# Patient Record
Sex: Female | Born: 1945
Health system: Southern US, Community
[De-identification: ages and names within clinical notes are randomized; demographics above are authoritative.]

## PROBLEM LIST (undated history)

## (undated) DIAGNOSIS — I10 Essential (primary) hypertension: Secondary | ICD-10-CM

## (undated) DIAGNOSIS — J45909 Unspecified asthma, uncomplicated: Secondary | ICD-10-CM

## (undated) DIAGNOSIS — F419 Anxiety disorder, unspecified: Secondary | ICD-10-CM

## (undated) DIAGNOSIS — M1990 Unspecified osteoarthritis, unspecified site: Secondary | ICD-10-CM

## (undated) DIAGNOSIS — F32A Depression, unspecified: Secondary | ICD-10-CM

## (undated) DIAGNOSIS — E079 Disorder of thyroid, unspecified: Secondary | ICD-10-CM

## (undated) HISTORY — DX: Unspecified osteoarthritis, unspecified site: M19.90

## (undated) HISTORY — DX: Disorder of thyroid, unspecified: E07.9

## (undated) HISTORY — DX: Unspecified asthma, uncomplicated: J45.909

## (undated) HISTORY — DX: Anxiety disorder, unspecified: F41.9

## (undated) HISTORY — DX: Essential (primary) hypertension: I10

---
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
## 1983-03-26 HISTORY — PX: BREAST CYST ASPIRATION: SHX578

## 1986-03-25 HISTORY — PX: HEMORRHOIDECTOMY WITH HEMORRHOID BANDING: SHX5633

## 1988-03-25 HISTORY — PX: BLADDER SUSPENSION: SHX72

## 1988-03-25 HISTORY — PX: ABDOMINAL HYSTERECTOMY: SHX81

## 2003-03-12 DIAGNOSIS — K589 Irritable bowel syndrome without diarrhea: Secondary | ICD-10-CM | POA: Insufficient documentation

## 2003-05-24 ENCOUNTER — Other Ambulatory Visit: Payer: Self-pay

## 2004-08-16 ENCOUNTER — Ambulatory Visit: Payer: Self-pay | Admitting: Family Medicine

## 2005-05-06 ENCOUNTER — Ambulatory Visit: Payer: Self-pay | Admitting: Family Medicine

## 2005-08-23 ENCOUNTER — Ambulatory Visit: Payer: Self-pay | Admitting: Family Medicine

## 2005-10-15 ENCOUNTER — Ambulatory Visit: Payer: Self-pay | Admitting: Family Medicine

## 2006-08-19 DIAGNOSIS — F329 Major depressive disorder, single episode, unspecified: Secondary | ICD-10-CM | POA: Insufficient documentation

## 2006-08-19 DIAGNOSIS — F32A Depression, unspecified: Secondary | ICD-10-CM | POA: Insufficient documentation

## 2006-08-29 ENCOUNTER — Ambulatory Visit: Payer: Self-pay | Admitting: Family Medicine

## 2006-10-21 ENCOUNTER — Ambulatory Visit: Payer: Self-pay | Admitting: Family Medicine

## 2006-10-29 ENCOUNTER — Ambulatory Visit: Payer: Self-pay | Admitting: Family Medicine

## 2006-12-22 ENCOUNTER — Ambulatory Visit: Payer: Self-pay | Admitting: Gastroenterology

## 2007-08-28 ENCOUNTER — Ambulatory Visit: Payer: Self-pay | Admitting: Family Medicine

## 2007-09-28 ENCOUNTER — Emergency Department: Payer: Self-pay | Admitting: Emergency Medicine

## 2007-10-22 ENCOUNTER — Ambulatory Visit: Payer: Self-pay | Admitting: Family Medicine

## 2007-10-22 DIAGNOSIS — J45909 Unspecified asthma, uncomplicated: Secondary | ICD-10-CM | POA: Insufficient documentation

## 2008-03-03 DIAGNOSIS — R7309 Other abnormal glucose: Secondary | ICD-10-CM | POA: Insufficient documentation

## 2008-03-28 DIAGNOSIS — I10 Essential (primary) hypertension: Secondary | ICD-10-CM | POA: Insufficient documentation

## 2008-10-24 ENCOUNTER — Ambulatory Visit: Payer: Self-pay | Admitting: Family Medicine

## 2009-01-02 DIAGNOSIS — G939 Disorder of brain, unspecified: Secondary | ICD-10-CM | POA: Insufficient documentation

## 2009-01-02 DIAGNOSIS — I6782 Cerebral ischemia: Secondary | ICD-10-CM | POA: Insufficient documentation

## 2009-01-04 ENCOUNTER — Ambulatory Visit: Payer: Self-pay | Admitting: Family Medicine

## 2009-02-28 ENCOUNTER — Other Ambulatory Visit: Payer: Self-pay | Admitting: Neurology

## 2009-03-08 ENCOUNTER — Ambulatory Visit: Payer: Self-pay | Admitting: Neurology

## 2009-10-30 ENCOUNTER — Ambulatory Visit: Payer: Self-pay | Admitting: Neurology

## 2009-11-16 ENCOUNTER — Ambulatory Visit: Payer: Self-pay | Admitting: Family Medicine

## 2009-11-22 ENCOUNTER — Ambulatory Visit: Payer: Self-pay | Admitting: Family Medicine

## 2010-11-20 ENCOUNTER — Ambulatory Visit: Payer: Self-pay | Admitting: Family Medicine

## 2011-03-06 ENCOUNTER — Other Ambulatory Visit: Payer: Self-pay | Admitting: Family Medicine

## 2011-07-01 DIAGNOSIS — I4949 Other premature depolarization: Secondary | ICD-10-CM | POA: Diagnosis not present

## 2011-07-01 DIAGNOSIS — I059 Rheumatic mitral valve disease, unspecified: Secondary | ICD-10-CM | POA: Diagnosis not present

## 2011-07-01 DIAGNOSIS — I119 Hypertensive heart disease without heart failure: Secondary | ICD-10-CM | POA: Diagnosis not present

## 2011-08-30 DIAGNOSIS — R0902 Hypoxemia: Secondary | ICD-10-CM | POA: Diagnosis not present

## 2011-08-30 DIAGNOSIS — I10 Essential (primary) hypertension: Secondary | ICD-10-CM | POA: Diagnosis not present

## 2011-09-20 DIAGNOSIS — R0902 Hypoxemia: Secondary | ICD-10-CM | POA: Diagnosis not present

## 2011-11-26 ENCOUNTER — Institutional Professional Consult (permissible substitution): Payer: Self-pay | Admitting: Pulmonary Disease

## 2011-12-03 ENCOUNTER — Ambulatory Visit: Payer: Self-pay | Admitting: Family Medicine

## 2011-12-03 DIAGNOSIS — R319 Hematuria, unspecified: Secondary | ICD-10-CM | POA: Diagnosis not present

## 2011-12-03 DIAGNOSIS — Z1231 Encounter for screening mammogram for malignant neoplasm of breast: Secondary | ICD-10-CM | POA: Diagnosis not present

## 2011-12-03 DIAGNOSIS — R350 Frequency of micturition: Secondary | ICD-10-CM | POA: Diagnosis not present

## 2011-12-03 DIAGNOSIS — N952 Postmenopausal atrophic vaginitis: Secondary | ICD-10-CM | POA: Diagnosis not present

## 2012-01-13 ENCOUNTER — Telehealth: Payer: Self-pay | Admitting: Pulmonary Disease

## 2012-01-13 NOTE — Telephone Encounter (Signed)
Caller: Zyah/Patient; Patient Name: [place]; PCP: Orville Govern; Best Callback Phone Number: [phone] Wanting to confirm appointment time for Monday 01/20/2012 -- is 3:00 pm    Wanting to get directions to office.  PLEASE REVIEW AND CALL PATIENT WITH DIRECTIONS TO OFFICE. CAN BE REACHED AT  [phone].

## 2012-01-14 NOTE — Telephone Encounter (Signed)
LMTCB

## 2012-01-15 NOTE — Telephone Encounter (Signed)
LMTCB

## 2012-01-15 NOTE — Telephone Encounter (Signed)
Pt called back, spoke with Juanita and appt date, time and location confirmed Pt stated nothing further was needed

## 2012-01-16 DIAGNOSIS — Z23 Encounter for immunization: Secondary | ICD-10-CM | POA: Diagnosis not present

## 2012-01-16 DIAGNOSIS — E039 Hypothyroidism, unspecified: Secondary | ICD-10-CM | POA: Diagnosis not present

## 2012-01-16 DIAGNOSIS — R7309 Other abnormal glucose: Secondary | ICD-10-CM | POA: Diagnosis not present

## 2012-01-16 DIAGNOSIS — R319 Hematuria, unspecified: Secondary | ICD-10-CM | POA: Diagnosis not present

## 2012-01-16 DIAGNOSIS — Z0000 Encounter for general adult medical examination without abnormal findings: Secondary | ICD-10-CM | POA: Diagnosis not present

## 2012-01-16 DIAGNOSIS — N390 Urinary tract infection, site not specified: Secondary | ICD-10-CM | POA: Diagnosis not present

## 2012-01-16 DIAGNOSIS — Z01419 Encounter for gynecological examination (general) (routine) without abnormal findings: Secondary | ICD-10-CM | POA: Diagnosis not present

## 2012-01-17 ENCOUNTER — Other Ambulatory Visit: Payer: Self-pay | Admitting: Family Medicine

## 2012-01-17 DIAGNOSIS — I10 Essential (primary) hypertension: Secondary | ICD-10-CM | POA: Diagnosis not present

## 2012-01-17 DIAGNOSIS — E039 Hypothyroidism, unspecified: Secondary | ICD-10-CM | POA: Diagnosis not present

## 2012-01-17 DIAGNOSIS — M899 Disorder of bone, unspecified: Secondary | ICD-10-CM | POA: Diagnosis not present

## 2012-01-17 DIAGNOSIS — R7309 Other abnormal glucose: Secondary | ICD-10-CM | POA: Diagnosis not present

## 2012-01-17 DIAGNOSIS — Z0000 Encounter for general adult medical examination without abnormal findings: Secondary | ICD-10-CM | POA: Diagnosis not present

## 2012-01-17 LAB — COMPREHENSIVE METABOLIC PANEL
Alkaline Phosphatase: 84 U/L (ref 50–136)
BUN: 10 mg/dL (ref 7–18)
Chloride: 95 mmol/L — ABNORMAL LOW (ref 98–107)
EGFR (African American): >60
Glucose: 97 mg/dL (ref 65–99)
SGOT(AST): 25 U/L (ref 15–37)
SGPT (ALT): 20 U/L (ref 12–78)
Total Protein: 8 g/dL (ref 6.4–8.2)

## 2012-01-17 LAB — CBC WITH DIFFERENTIAL/PLATELET
Basophil %: 0.4 %
Eosinophil #: 0.1 10*3/uL (ref 0.0–0.7)
HGB: 12.8 g/dL (ref 12.0–16.0)
Lymphocyte %: 14.8 %
MCH: 33.5 pg (ref 26.0–34.0)
MCHC: 34.5 g/dL (ref 32.0–36.0)
Monocyte #: 0.7 x10 3/mm (ref 0.2–0.9)
Neutrophil %: 77.1 %
Platelet: 220 10*3/uL (ref 150–440)

## 2012-01-20 ENCOUNTER — Ambulatory Visit (INDEPENDENT_AMBULATORY_CARE_PROVIDER_SITE_OTHER): Payer: Medicare Other | Admitting: Pulmonary Disease

## 2012-01-20 ENCOUNTER — Encounter (HOSPITAL_COMMUNITY): Payer: Self-pay | Admitting: Psychiatry

## 2012-01-20 ENCOUNTER — Encounter: Payer: Self-pay | Admitting: Pulmonary Disease

## 2012-01-20 ENCOUNTER — Ambulatory Visit (INDEPENDENT_AMBULATORY_CARE_PROVIDER_SITE_OTHER): Payer: Medicare Other | Admitting: Psychiatry

## 2012-01-20 VITALS — BP 139/72 | HR 68 | Ht 69.0 in | Wt 165.4 lb

## 2012-01-20 VITALS — BP 112/64 | HR 62 | Temp 98.2°F | Ht 69.0 in | Wt 165.1 lb

## 2012-01-20 DIAGNOSIS — R0600 Dyspnea, unspecified: Secondary | ICD-10-CM

## 2012-01-20 DIAGNOSIS — R1900 Intra-abdominal and pelvic swelling, mass and lump, unspecified site: Secondary | ICD-10-CM | POA: Insufficient documentation

## 2012-01-20 DIAGNOSIS — R0609 Other forms of dyspnea: Secondary | ICD-10-CM

## 2012-01-20 DIAGNOSIS — R0602 Shortness of breath: Secondary | ICD-10-CM | POA: Insufficient documentation

## 2012-01-20 DIAGNOSIS — R0989 Other specified symptoms and signs involving the circulatory and respiratory systems: Secondary | ICD-10-CM

## 2012-01-20 DIAGNOSIS — F419 Anxiety disorder, unspecified: Secondary | ICD-10-CM

## 2012-01-20 DIAGNOSIS — R1906 Epigastric swelling, mass or lump: Secondary | ICD-10-CM | POA: Diagnosis not present

## 2012-01-20 DIAGNOSIS — R0902 Hypoxemia: Secondary | ICD-10-CM

## 2012-01-20 DIAGNOSIS — F411 Generalized anxiety disorder: Secondary | ICD-10-CM

## 2012-01-20 DIAGNOSIS — G4734 Idiopathic sleep related nonobstructive alveolar hypoventilation: Secondary | ICD-10-CM | POA: Insufficient documentation

## 2012-01-20 MED ORDER — HYDROXYZINE PAMOATE 25 MG PO CAPS: ORAL_CAPSULE | ORAL | Status: DC

## 2012-01-20 NOTE — Assessment & Plan Note (Signed)
I was surprised to feel a palpable dominant mass in the left aspect of her epigastrium today. My review of her primary care physician notes shows an unexplained hematuria so I am concerned for a renal mass.  Plan: -I discussed my concern with the patient and explained to her that because of this am going to order a CT of her abdomen and pelvis with contrast to evaluate the mass.

## 2012-01-20 NOTE — Assessment & Plan Note (Signed)
[place] apparently has been diagnosed with "mild sleep apnea" based on a recent overnight sleep study.  As of yet we do not have these results. She has no symptoms of sleep apnea but she was sent for the sleep study because of increasing forgetfulness and "problems with her judgment". I explained to her that the very rare episodes of mild hypoxemia noted on her overnight sleep study are unlikely to be the cause of these symptoms.   However if she has hypertension and sleep apnea that it would be reasonable to proceed with a CPAP titration study. I will obtain records of the sleep study performed here in [place] and then make a referral for a CPAP titration study. We discussed the risks of sleep apnea and the benefits of CPAP use. After some discussion she elected to proceed with the CPAP titration study.

## 2012-01-20 NOTE — Assessment & Plan Note (Signed)
[place] says that she has very mild shortness of breath when climbing a hill at a brisk pace. In the past she has had frequent episodes of bronchitis associated with allergic rhinitis. However in the last 10 years she says that she's had very minimal symptoms of shortness of breath and only occasionally has to use an albuterol inhaler if exposed to fumes, dust or dirt. In fact she says she is only used an inhaler once in the last 8 months. Given the fact that her oxygenation is normal, her exam is normal, her exercise tolerance is great, and her simple spirometry was normal today I see no evidence of a clear lung abnormality.  If she develops worsening shortness of breath and will proceed with chest imaging and further diagnostic testing. For now she can continue using the albuterol inhaler on an as-needed basis for chest tightness and wheezing.

## 2012-01-20 NOTE — Progress Notes (Signed)
Subjective:    Patient ID: [place], female    DOB: 09-06-1945, 66 y.o.   MRN: [id number]  HPI  This is a very pleasant 66 year old female who comes to our clinic today for evaluation of nocturnal hypoxemia. She states that she had a normal childhood without respiratory symptoms but developed recurrent episodes of bronchitis 20 years ago when she first moved to [place]. Her symptoms were associated with sinus congestion, allergic rhinitis, and sputum production which she said occurred about 3 times a year for 10 years. She was to eat with immunotherapy and then eventually her symptoms went away altogether. For the last 10 years she's had very little shortness of breath except when exercising. In the last several months she's lost 20-30 pounds by walking 3-4 miles 4-5 times a week. She states that when she climbs a hill at a brisk day she sometimes feel short of breath. She also notes occasionally when she is exposed to certain dirts, dust or fumes she will get a sensation of chest tightness which is relieved with albuterol. In the last 8 months she has only had to use her albuterol one time. She is lifelong nonsmoker but her husband has smoked regularly for the last 30 years.  She was referred to [place] because her oxygen level was found to be low on an overnight sleep study. She states that she was originally sent for a sleep study after she had complaints of increasing forgetfulness and lapses in her judgment. Apparently the sleep study (I do not have results) showed "mild sleep apnea". After this she was sent for an overnight oximetry test which showed that her oxygen level dropped below 88% only 1.9% of the time or a total of 1 minute and 48 seconds. She states that her husband tells her that she snores heavily and he has noticed her "stop breathing while sleeping". She states that she feels rested when she wakes up, she denies morning headaches, and she denies falling asleep during the day. In  general she feels quite well aside from the forgetfulness.   Past Medical History  Diagnosis Date  . Thyroid disease   . Asthma   . HTN (hypertension)      Family History  Problem Relation Age of Onset  . Depression Father   . Depression Sister   . Bipolar disorder Son   . Allergies Brother   . Allergies Sister   . Asthma Maternal Uncle   . Heart disease Mother   . Heart disease Maternal Grandfather   . Heart disease Maternal Grandmother   . Colon cancer Mother      History   Social History  . Marital Status: Married    Spouse Name: N/A    Number of Children: 3  . Years of Education: N/A   Occupational History  . [place]    Social History Main Topics  . Smoking status: Never Smoker   . Smokeless tobacco: Never Used  . Alcohol Use: No  . Drug Use: No  . Sexually Active: Not on file   Other Topics Concern  . Not on file   Social History Narrative  . No narrative on file     Allergies  Allergen Reactions  . [place] Anaphylaxis     No outpatient prescriptions prior to visit.      Review of Systems  Constitutional: Negative for fever, chills and unexpected weight change.  HENT: Negative for ear pain, nosebleeds, congestion, sore throat, rhinorrhea, sneezing, trouble  swallowing, dental problem, voice change, postnasal drip and sinus pressure.   Eyes: Negative for visual disturbance.  Respiratory: Positive for shortness of breath. Negative for cough and choking.   Cardiovascular: Negative for chest pain and leg swelling.  Gastrointestinal: Negative for vomiting, abdominal pain and diarrhea.  Genitourinary: Negative for difficulty urinating.  Musculoskeletal: Negative for arthralgias.  Skin: Negative for rash.  Neurological: Negative for tremors, syncope and headaches.  Hematological: Does not bruise/bleed easily.       Objective:   Physical Exam  Filed Vitals:   01/20/12 1525  BP: 112/64  Pulse: 62  Temp: 98.2 F (36.8  C)  TempSrc: Oral  Height: 5\' 9"  (1.753 m)  Weight: 165 lb 1.9 oz (74.898 kg)  SpO2: 96%   Gen: well appearing, no acute distress HEENT: NCAT, PERRL, EOMi, OP clear, neck supple without masses PULM: CTA B CV: RRR, no mgr, no JVD AB: BS+, palpable hard mass in the epigastric area, non-pulsatile; no guarding or rebound tenderness Ext: warm, no edema, no clubbing, no cyanosis Derm: no rash or skin breakdown Neuro: A&Ox4, CN II-XII intact, strength 5/5 in all 4 extremities  01/20/2012 Simple spirometry normal     Assessment & Plan:   Dyspnea Ms. Ochoa says that she has very mild shortness of breath when climbing a hill at a brisk pace. In the past she has had frequent episodes of bronchitis associated with allergic rhinitis. However in the last 10 years she says that she's had very minimal symptoms of shortness of breath and only occasionally has to use an albuterol inhaler if exposed to fumes, dust or dirt. In fact she says she is only used an inhaler once in the last 8 months. Given the fact that her oxygenation is normal, her exam is normal, her exercise tolerance is great, and her simple spirometry was normal today I see no evidence of a clear lung abnormality.  If she develops worsening shortness of breath and will proceed with chest imaging and further diagnostic testing. For now she can continue using the albuterol inhaler on an as-needed basis for chest tightness and wheezing.  Nocturnal hypoxemia [place] apparently has been diagnosed with "mild sleep apnea" based on a recent overnight sleep study.  As of yet we do not have these results. She has no symptoms of sleep apnea but she was sent for the sleep study because of increasing forgetfulness and "problems with her judgment". I explained to her that the very rare episodes of mild hypoxemia noted on her overnight sleep study are unlikely to be the cause of these symptoms.   However if she has hypertension and sleep apnea  that it would be reasonable to proceed with a CPAP titration study. I will obtain records of the sleep study performed here in [place] and then make a referral for a CPAP titration study. We discussed the risks of sleep apnea and the benefits of CPAP use. After some discussion she elected to proceed with the CPAP titration study.  Abdominal mass I was surprised to feel a palpable dominant mass in the left aspect of her epigastrium today. My review of her primary care physician notes shows an unexplained hematuria so I am concerned for a renal mass.  Plan: -I discussed my concern with the patient and explained to her that because of this am going to order a CT of her abdomen and pelvis with contrast to evaluate the mass.   Updated Medication List Outpatient Encounter Prescriptions as of 01/20/2012  Medication Sig Dispense Refill  . albuterol (PROAIR HFA) 108 (90 BASE) MCG/ACT inhaler Inhale 2 puffs into the lungs every 6 (six) hours as needed.      . ALPRAZolam (XANAX) 0.5 MG tablet 2 at bedtime and as directed during the day      . aspirin 81 MG tablet Take 81 mg by mouth daily.      Marland Kitchen CALCIUM PO Take 1 tablet by mouth daily.      Marland Kitchen ESTRACE VAGINAL 0.1 MG/GM vaginal cream as needed.       Marland Kitchen KRILL OIL PO Take 1 capsule by mouth daily.      Marland Kitchen lisinopril-hydrochlorothiazide (PRINZIDE,ZESTORETIC) 20-25 MG per tablet Take 1 tablet by mouth daily.       . metoprolol (LOPRESSOR) 50 MG tablet Take 50 mg by mouth 2 (two) times daily.       . Multiple Vitamin (MULTIVITAMIN) capsule Take 1 capsule by mouth daily.      . nortriptyline (PAMELOR) 25 MG capsule Take 75 mg by mouth at bedtime.      Marland Kitchen SYNTHROID 75 MCG tablet Take 75 mcg by mouth daily.       Marland Kitchen DISCONTD: hydrOXYzine (VISTARIL) 25 MG capsule Take 1 to 2 capsule at bed time  60 capsule  0

## 2012-01-20 NOTE — Progress Notes (Signed)
Chief complaint I need a psychiatrist.  My psychiatrist is retiring.  History presenting illness Patient is 66 year old Caucasian married employed female who came as is self-referred for continuation of treatment.  Patient is seeing Dr. Thomasenia Sales in [place].  Her psychiatrist is retiring and she wants to establish her care in this office.  Patient endorsed long history of depression anxiety and insomnia.  She has been fairly stable on Pamelor 75 mg. she is also taking Xanax 0.5 mg 2 tablet at bedtime for her anxiety.  There are some days when she takes 0.5 mg Xanax noon today.  Patient endorsed multiple stressors in her life.  Her son has bipolar disorder, her daughter has pituitary tumor and Cushing's disease and her granddaughter has significant behavior problem and issues at school.  Patient is very concerned about her family and children.  Last year patient endorse that she has at the verge of nervous breakdown.  She has a lot of stress at work and her daughter is very sake.  She was given Xanax by her primary care physician which was continued by her psychiatrist.  Patient wants to come off from Xanax as she is aware about dependence and daughters issue with the benzodiazepine.  Which is also very concerned that without Xanax she may not sleep well.  Patient is Designer, jewellery.  In the past she had tried, been given trazodone with limited response.  At this time patient feel her depression anxiety is better with the medication.  She sleeping better.  He denies any recent panic attack agitation anger mood swing.  Her energy level is good.  She denies any active or passive suicidal thoughts or homicidal thoughts.  She seeing therapist in [place] but like to establish her counseling in this office.  She denies any feeling of hopelessness helplessness or any worthlessness.  She's not drinking or using any illegal substance.  Current psychiatric medication Pamelor 25 mg 3 tablet at bedtime    Xanax 0.5 mg 2 tablet at bedtime and Monday the day if needed  Past psychiatric history Patient endorse history of depression and anxiety since 1988.  At that time her children were very sick.  2 children died in 6 months apart with significant stiffness.  Patient was very nervous depressed and she developed weakness on her right side.  She was recommended to see psychiatrist.  She saw Dr. Thomasenia Sales and Mr. Salem.  She did response very well on Pamelor.  Few years ago she developed urinary hesitancy with the medication and patient was tried on gabapentin and trazodone with limited help.  She start taking Pamelor and it is working very well.  Xanax was added later as patient continued to have anxiety related to her children.  Patient denies any history of suicidal attempt or any inpatient psychiatric treatment.  Patient denies any history of psychosis mania or violence.  Psychosocial history Patient born and raised in [place].  She's been married.  Her husband is very supportive.  She has 3 children.  Family history Father committed suicide.  Sister has depression.  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  Alcohol and substance use history Patient denies any history of alcohol or substance use.  Medical history She has history of asthma, hypertension and recently noticed forgetfulness.  Patient also scheduled to see a pulmonologist for sleep studies to rule out apnea.  Her primary care physician is Dr. Elease Hashimoto in [place].  Mental status  examination Patient is well-groomed well-dressed female who appears to be in his stated age.  She maintained good eye contact.  She is pleasant and cooperative.  Her speech is soft clear and coherent.  Her thought process is logical linear and goal-directed.  She described her mood is anxious and her affect is mood appropriate.  She denies any active or passive suicidal thoughts or  homicidal thoughts.  She denies any auditory or visual hallucination.  There were no psychotic symptoms present at this time.  She has good fund of knowledge.  Her attention and concentration is good.  There were no tremors or shakes present.  She's alert and oriented x3.  Her insight judgment and impulse control is okay.  Assessment Axis I anxiety disorder NOS Axis II deferred Axis III hypertension, asthma Axis IV mild to moderate Axis V 65-75  Plan At this time patient is fairly stable on her medication however she like to wean herself from Xanax.  I recommend to try Vistaril 25 mg for insomnia which can also helpful for anxiety.  I explained risks and benefits of medication.  I recommend to try cross-taper Xanax.  She will continue Pamelor at present does.  Her prescription has been written by primary care physician.  We will schedule appointment with her therapist in this office for coping and social skills.  I also recommend to call us if she is any question or concern if she feels worsening of the symptom.  I will see her again in 2-3 weeks.

## 2012-01-20 NOTE — Patient Instructions (Addendum)
We will refer you for a CPAP titration study We will send you for a CT abdomen/pelvis to look into the hard place I felt in your abdomen.  We will call you with the results this week We will see you back after the sleep study. In

## 2012-01-22 ENCOUNTER — Telehealth: Payer: Self-pay | Admitting: Pulmonary Disease

## 2012-01-22 ENCOUNTER — Ambulatory Visit: Payer: Self-pay | Admitting: Pulmonary Disease

## 2012-01-22 DIAGNOSIS — R1909 Other intra-abdominal and pelvic swelling, mass and lump: Secondary | ICD-10-CM | POA: Diagnosis not present

## 2012-01-22 DIAGNOSIS — K319 Disease of stomach and duodenum, unspecified: Secondary | ICD-10-CM | POA: Diagnosis not present

## 2012-01-22 DIAGNOSIS — R599 Enlarged lymph nodes, unspecified: Secondary | ICD-10-CM | POA: Diagnosis not present

## 2012-01-22 NOTE — Telephone Encounter (Signed)
I called [place] to discuss the results of her CT Abdomen from [facility].  No evidence of a mass but retroperitoneal adenopathy noted.  Radiology recommends a PET scan to follow up.  I will discuss this with Dr. Elease Hashimoto and get back to [place] after we decide how to proceed.  Yolonda Kida PCCM Pager: [phone] Cell: [phone] If no response, call [phone]

## 2012-01-23 ENCOUNTER — Other Ambulatory Visit: Payer: Self-pay | Admitting: Pulmonary Disease

## 2012-01-23 DIAGNOSIS — R590 Localized enlarged lymph nodes: Secondary | ICD-10-CM

## 2012-01-24 ENCOUNTER — Telehealth: Payer: Self-pay | Admitting: Family Medicine

## 2012-01-30 ENCOUNTER — Encounter: Payer: Self-pay | Admitting: Pulmonary Disease

## 2012-02-03 ENCOUNTER — Encounter (HOSPITAL_COMMUNITY): Payer: PRIVATE HEALTH INSURANCE

## 2012-02-04 ENCOUNTER — Telehealth: Payer: Self-pay | Admitting: *Deleted

## 2012-02-04 ENCOUNTER — Encounter: Payer: Self-pay | Admitting: Pulmonary Disease

## 2012-02-04 ENCOUNTER — Encounter (HOSPITAL_COMMUNITY)
Admission: RE | Admit: 2012-02-04 | Discharge: 2012-02-04 | Disposition: A | Discharge status: Home / Self Care | Payer: Medicare Other | Source: Ambulatory Visit | Attending: Pulmonary Disease | Admitting: Pulmonary Disease

## 2012-02-04 DIAGNOSIS — Z90710 Acquired absence of both cervix and uterus: Secondary | ICD-10-CM | POA: Diagnosis not present

## 2012-02-04 DIAGNOSIS — I517 Cardiomegaly: Secondary | ICD-10-CM | POA: Insufficient documentation

## 2012-02-04 DIAGNOSIS — M76899 Other specified enthesopathies of unspecified lower limb, excluding foot: Secondary | ICD-10-CM | POA: Insufficient documentation

## 2012-02-04 DIAGNOSIS — R590 Localized enlarged lymph nodes: Secondary | ICD-10-CM

## 2012-02-04 DIAGNOSIS — R599 Enlarged lymph nodes, unspecified: Secondary | ICD-10-CM | POA: Insufficient documentation

## 2012-02-04 DIAGNOSIS — N289 Disorder of kidney and ureter, unspecified: Secondary | ICD-10-CM | POA: Insufficient documentation

## 2012-02-04 MED ORDER — FLUDEOXYGLUCOSE F - 18 (FDG) INJECTION
19.7000 | Freq: Once | INTRAVENOUS | Status: AC | PRN
  Administered 2012-02-04: 19.7 via INTRAVENOUS

## 2012-02-04 NOTE — Telephone Encounter (Signed)
Message copied by [name] on Tue Feb 04, 2012  3:02 PM ------      Message from: [name]      Created: Tue Feb 04, 2012  9:03 AM       L,            Please let her know that her sleep study ( from 02/2011) was OK and I do NOT think that she needs a CPAP titration study.            B

## 2012-02-04 NOTE — Telephone Encounter (Signed)
Spoke with pt and notified of recs per Dr. Kendrick Fries.  She verbalized understanding and denied any questions.  I advised that we will call her later today to discuss PET results She wants [place] to call her cell [phone] Will forward to Dr. Kendrick Fries as a reminder to call with results.

## 2012-02-05 ENCOUNTER — Telehealth: Payer: Self-pay | Admitting: Pulmonary Disease

## 2012-02-05 NOTE — Telephone Encounter (Signed)
I called Ms. Wyre to discuss the results of the PET CT.  There are multiple lymph nodes enlarged and hypermetabolic throughout her abdomen, retroperitoneal space, and mediastinum.  Uncertain etiology, concerning for lymphoma.  I explained to her that the differential diagnosis is broad and only really solved with a biopsy.  She would like to have a referral to the [facility] to discuss the results.    Our office will need to forward the results of the PET/CT to the [facility] cancer center and make a referral for her to be seen there ASAP.    In terms of the nocturnal hypoxemia.Marland KitchenMarland Kitchen I was able to review the overnight oximetry test and it showed that she only dropped below 88% for 1-2 minutes throughout the entire night. This is really minimal and doesn't need further investigation.  Yolonda Kida PCCM Pager: [phone] Cell: [phone] If no response, call [phone]

## 2012-02-06 ENCOUNTER — Telehealth: Payer: Self-pay | Admitting: *Deleted

## 2012-02-06 DIAGNOSIS — R948 Abnormal results of function studies of other organs and systems: Secondary | ICD-10-CM

## 2012-02-06 NOTE — Telephone Encounter (Signed)
Message copied by [name] on Thu Feb 06, 2012 10:21 AM ------      Message from: [name]      Created: Wed Feb 05, 2012  8:33 PM       [place],            Can you have [place] set up an appointment with the [facility] cancer center because of her positive PET CT.  She wants to talk to an oncologist first before seeing a surgeon about a biopsy.  The reason to see the oncologist would be to discuss the results of the scan.            [place] could probably help [place] here.  [place] has a strange schedule right now because of her daughter's illness, so she will need to talk to [facility] about a good date for the [facility] visit.            Thanks,      [place]

## 2012-02-06 NOTE — Telephone Encounter (Signed)
Will send order to [facility] 

## 2012-02-10 ENCOUNTER — Telehealth: Payer: Self-pay | Admitting: Pulmonary Disease

## 2012-02-10 NOTE — Telephone Encounter (Signed)
Pt aware records have been faxed to oncology center [facility] [place]

## 2012-02-12 ENCOUNTER — Encounter (HOSPITAL_BASED_OUTPATIENT_CLINIC_OR_DEPARTMENT_OTHER): Payer: Self-pay

## 2012-02-13 ENCOUNTER — Ambulatory Visit (HOSPITAL_COMMUNITY): Payer: Self-pay | Admitting: Psychiatry

## 2012-02-14 ENCOUNTER — Ambulatory Visit (HOSPITAL_COMMUNITY): Payer: Self-pay | Admitting: Psychiatry

## 2012-02-17 ENCOUNTER — Encounter (HOSPITAL_BASED_OUTPATIENT_CLINIC_OR_DEPARTMENT_OTHER): Payer: Self-pay

## 2012-02-17 ENCOUNTER — Ambulatory Visit (HOSPITAL_COMMUNITY): Payer: Self-pay | Admitting: Licensed Clinical Social Worker

## 2012-02-24 ENCOUNTER — Ambulatory Visit (INDEPENDENT_AMBULATORY_CARE_PROVIDER_SITE_OTHER): Payer: Medicare Other | Admitting: Licensed Clinical Social Worker

## 2012-02-24 ENCOUNTER — Encounter (HOSPITAL_COMMUNITY): Payer: Self-pay | Admitting: Licensed Clinical Social Worker

## 2012-02-24 ENCOUNTER — Encounter (HOSPITAL_BASED_OUTPATIENT_CLINIC_OR_DEPARTMENT_OTHER): Payer: Self-pay

## 2012-02-24 DIAGNOSIS — F411 Generalized anxiety disorder: Secondary | ICD-10-CM | POA: Insufficient documentation

## 2012-02-24 NOTE — Progress Notes (Signed)
Patient ID: AIDEL BINGAMAN, female   DOB: 07-Oct-1945, 66 y.o.   MRN: [id number] Patient:   Wanda Ochoa   DOB:   1945/10/01  MR Number:  [id number]  Location:  [facility] OUTPATIENT THERAPY [place] [address] [id number] [place] [address] Dept: [phone]           Date of Service:   02/24/2012  Start Time:   2:00pm End Time:   2:50pm  Provider/Observer:  Geanie Berlin LCSW       Billing Code/Service: [phone]  Chief Complaint:     Chief Complaint  Patient presents with  . Anxiety    sleep 8 hours with medicaation, appetite wnl  . Stress    Reason for Service:  Patient is referred by Dr. Lolly Mustache for the treatment of anxiety.   Current Status:  Patient reports a long history of depression and anxiety and was being treated successfully in [place], but her psychiatrist is retiring and so she sought out care here. She reports generalized anxiety over the past six months, worsening lately. She endorses some difficulty making decisions and focusing due to this anxiety. She denies any depression, but rather has normal sadness related to her daughters poor health. She is motivated and is able to enjoy time with others and activities. Her sleep and appetite are wnl. She is anxious about relying upon Xanax for sleep and anxiety management as she does not want to develop dependency. She denies any psychosis, her thought processes are clear and goal directed and she is well educated. She had excellent self care and a very positive attitude about her life. She had to take a leave of absence from her job for the past four months to care for her daughter. She has traveled to and from [place] and shares this with her husband, who also travels in an alternating schedule. She has an enlarged lymph node in her stomach which was incidentally found and she will have a follow up appointment this week to determine if it is cancerous. She denies  any thoughts, intent or plan to harm herself or others.   Reliability of Information: Very good  Behavioral Observation: Wanda Ochoa  presents as a 66 y.o.-year-old  Caucasian Female who appeared her stated age. her dress was Appropriate and she was Well Groomed and her manners were Appropriate to the situation.  There were not any physical disabilities noted.  she displayed an appropriate level of cooperation and motivation.    Interactions:    Active   Attention:   within normal limits  Memory:   within normal limits  Visuo-spatial:   within normal limits  Speech (Volume):  normal  Speech:   normal pitch and normal volume  Thought Process:  Coherent and Relevant  Though Content:  WNL  Orientation:   person, place and time/date  Judgment:   Good  Planning:   Good  Affect:    Anxious  Mood:    Anxious  Insight:   Good  Intelligence:   high  Marital Status/Living: Married. Lives with husband.   Current Employment: Works as a Banker at [employer].   Past Employment:    Substance Use:  No concerns of substance abuse are reported.    Education:   College  Medical History:   Past Medical History  Diagnosis Date  . Thyroid disease   . Asthma   . HTN (hypertension)   . Anxiety  Outpatient Encounter Prescriptions as of 02/24/2012  Medication Sig Dispense Refill  . albuterol (PROAIR HFA) 108 (90 BASE) MCG/ACT inhaler Inhale 2 puffs into the lungs every 6 (six) hours as needed.      . ALPRAZolam (XANAX) 0.5 MG tablet 2 at bedtime and as directed during the day      . aspirin 81 MG tablet Take 81 mg by mouth daily.      Marland Kitchen CALCIUM PO Take 1 tablet by mouth daily.      Marland Kitchen ESTRACE VAGINAL 0.1 MG/GM vaginal cream as needed.       Marland Kitchen KRILL OIL PO Take 1 capsule by mouth daily.      Marland Kitchen lisinopril-hydrochlorothiazide (PRINZIDE,ZESTORETIC) 20-25 MG per tablet Take 1 tablet by mouth daily.       . metoprolol (LOPRESSOR) 50 MG  tablet Take 50 mg by mouth 2 (two) times daily.       . Multiple Vitamin (MULTIVITAMIN) capsule Take 1 capsule by mouth daily.      . nortriptyline (PAMELOR) 25 MG capsule Take 75 mg by mouth at bedtime.      Marland Kitchen SYNTHROID 75 MCG tablet Take 75 mcg by mouth daily.               Sexual History:   History  Sexual Activity  . Sexually Active: Not on file    Abuse/Trauma History: None  Psychiatric History:  History of outpatient treatment. No inpatient hospitalizations.   Family Med/Psych History:  Family History  Problem Relation Age of Onset  . Depression Father   . Suicidality Father   . Depression Sister   . Bipolar disorder Son   . Allergies Brother   . Allergies Sister   . Asthma Maternal Uncle   . Heart disease Mother   . Colon cancer Mother   . Heart disease Maternal Grandfather   . Heart disease Maternal Grandmother     Risk of Suicide/Violence: virtually non-existent   Impression/DX:  Generalized Anxiety Disorder   Disposition/Plan:  Bi weekly treatment to address anxiety .   Diagnosis:    Axis I:  Generalized Anxiety Disorder      Axis II: No diagnosis       Axis III:  HTN      Axis IV:  problems with primary support group          Axis V:  61-70 mild symptoms

## 2012-02-25 ENCOUNTER — Encounter (HOSPITAL_COMMUNITY): Payer: Self-pay | Admitting: Psychiatry

## 2012-02-25 ENCOUNTER — Ambulatory Visit (INDEPENDENT_AMBULATORY_CARE_PROVIDER_SITE_OTHER): Payer: Medicare Other | Admitting: Psychiatry

## 2012-02-25 VITALS — BP 128/62 | HR 80 | Wt 170.0 lb

## 2012-02-25 DIAGNOSIS — F419 Anxiety disorder, unspecified: Secondary | ICD-10-CM

## 2012-02-25 DIAGNOSIS — F411 Generalized anxiety disorder: Secondary | ICD-10-CM

## 2012-02-25 DIAGNOSIS — E871 Hypo-osmolality and hyponatremia: Secondary | ICD-10-CM | POA: Diagnosis not present

## 2012-02-25 MED ORDER — NORTRIPTYLINE HCL 25 MG PO CAPS: 25.0000 mg | ORAL_CAPSULE | Freq: Every day | ORAL | Status: DC

## 2012-02-25 NOTE — Progress Notes (Signed)
Patient ID: Wanda Ochoa, female   DOB: May 10, 1945, 66 y.o.   MRN: [id number]  [facility] [id number] Progress Note  Wanda Ochoa [id number] 66 y.o.  02/25/2012 3:25 PM  Chief Complaint: I tried Vistaril but I could not sleep.  I'm taking Xanax with Pamelor.  History of Present Illness:  Patient is 66 year old female who came for her followup appointment.  Patient was seen first time on October 28 in this office.  She is self-referred as her psychiatrist is retiring.  She also wants to wean herself off from Xanax and she was given Vistaril to help her anxiety and insomnia.  Patient tried Vistaril but she could not sleep very well.  Her anxiety get worse since she recently find out a large lymph node in her abdomen.  She scheduled to see oncologist tomorrow for further evaluation.  She is anxious about her physical health.  She does not want to change or reduce her Xanax at this time.  Patient is a Designer, jewellery and aware about benzodiazepine.  She had a good Thanksgiving with her sister and brother.  Her sister from [place] and [place] came and her brother also came from [place].  Patient also stressed about her daughter who lives in [place] and suffering from pituitary tumor.  Patient travels to [place] to take care of her daughter almost every week.  She feel overwhelmed due to family stress and her daughter's illness.  Her son still lives in [place] he has a bipolar disorder however patient is more concerned about her daughter and recently about herself.  Her granddaughter is scheduled to have testing for ADD and patient is happy about it.  Patient is tolerating her medication and denies any side effects.  She denies any crying spells but admitted her anxiety is increase in past few days.  She denies any feeling of hopelessness or helplessness.  She denies any paranoia or delusion.  She denies any active or passive suicidal thoughts.  She's not drinking or using  any illegal substance.  Suicidal Ideation: No Plan Formed: No Patient has means to carry out plan: No  Homicidal Ideation: No Plan Formed: No Patient has means to carry out plan: No  Review of Systems: Psychiatric: Agitation: No Hallucination: No Depressed Mood: Yes Insomnia: Yes Hypersomnia: No Altered Concentration: No Feels Worthless: No Grandiose Ideas: No Belief In Special Powers: No New/Increased Substance Abuse: No Compulsions: No  Neurologic: Headache: Yes Seizure: No Paresthesias: No  Past Psychiatric History; patient endorse history of depression and anxiety since 1988.  Patient reported at that time her children were very sick.  Her 2 children died in 6 months apart with significant illness.  Patient admitted at that time she was very nervous anxious and depressed.  She developed weakness on her right side.  At that time she was recommended to see psychiatrist.  She saw Dr. Thomasenia Sales in [place].  She did very well on Pamelor.  She also tried gabapentin and trazodone but it did not help her anxiety and insomnia.  Her Xanax was added later as patient continued to endorse anxiety symptoms.  Patient has a history of suicidal attempt or any inpatient psychiatric treatment.  Patient denies any history of psychosis mania or violence or manic symptoms.  Medical History; patient has history of asthma, hypertension and recently her pulmonologist found enlarged lymph node in her abdominal.  She scheduled to see oncologist tomorrow in [place].  Her primary care physician is Dr. Elease Hashimoto in [place].  Family and Social History: Patient endorse her father committed suicide.  Her son has bipolar disorder and her sister has depression.  Patient is born and raised in [place].  She's been married and her husband is very supportive.  Patient has 3 children.  Outpatient Encounter Prescriptions as of 02/25/2012  Medication Sig Dispense Refill  . albuterol (PROAIR HFA) 108  (90 BASE) MCG/ACT inhaler Inhale 2 puffs into the lungs every 6 (six) hours as needed.      . ALPRAZolam (XANAX) 0.5 MG tablet 2 at bedtime and as directed during the day      . aspirin 81 MG tablet Take 81 mg by mouth daily.      Marland Kitchen CALCIUM PO Take 1 tablet by mouth daily.      Marland Kitchen lisinopril-hydrochlorothiazide (PRINZIDE,ZESTORETIC) 20-25 MG per tablet Take 1 tablet by mouth daily.       . metoprolol (LOPRESSOR) 50 MG tablet Take 50 mg by mouth 2 (two) times daily.       . nortriptyline (PAMELOR) 25 MG capsule Take 75 mg by mouth at bedtime.      Marland Kitchen SYNTHROID 75 MCG tablet Take 75 mcg by mouth daily.       Marland Kitchen ESTRACE VAGINAL 0.1 MG/GM vaginal cream as needed.       Marland Kitchen KRILL OIL PO Take 1 capsule by mouth daily.      . Multiple Vitamin (MULTIVITAMIN) capsule Take 1 capsule by mouth daily.      . nortriptyline (PAMELOR) 25 MG capsule Take 1 capsule (25 mg total) by mouth at bedtime.  30 capsule  1    No results found for this or any previous visit (from the past 72 hour(s)).  Past Psychiatric History/Hospitalization(s): Anxiety: Yes Bipolar Disorder: No Depression: Yes Mania: No Psychosis: No Schizophrenia: No Personality Disorder: No Hospitalization for psychiatric illness: No History of Electroconvulsive Shock Therapy: No Prior Suicide Attempts: No  Physical Exam: Constitutional:  BP 128/62  Pulse 80  Wt 170 lb (77.111 kg)  Musculoskeletal: Strength & Muscle Tone: within normal limits Gait & Station: normal Patient leans: N/A  Review of Systems  Cardiovascular: Negative.   Neurological: Negative for dizziness, tingling, tremors, seizures and loss of consciousness.  Psychiatric/Behavioral: Positive for depression. Negative for suicidal ideas, hallucinations and substance abuse. The patient is nervous/anxious and has insomnia.    Mental Status Examination; patient is well groomed and well dressed female who appears to be in her his stated age.  She maintained good eye contact.   She's cooperative pleasant.  Her speech is clear and coherent.  Her thought processes are logical linear and goal-directed.  She described her mood is anxious and her affect is constricted.  She denies any active or passive suicidal thoughts or homicidal thoughts.  There were no flight of ideas or loose association.  Her fund of knowledge is adequate.  She denies any auditory or visual hallucination.  There were no psychotic symptoms present at this time.  Her concentration and attention is fair.  There were no tremors or shakes present.  She's alert and oriented x3.  Her insight judgment and impulse control is okay.   Medical Decision Making (Choose Three): Review of Psycho-Social Stressors (1), Review and summation of old records (2), Established Problem, Worsening (2), New Problem, with no additional work-up planned (3), Review of Medication Regimen & Side Effects (2) and Review of New Medication or Change in Dosage (2)  Axis I anxiety disorder NOS  Axis II deferred  Axis III  Patient Active Problem List  Diagnosis  . Nocturnal hypoxemia  . Abdominal mass  . Dyspnea  . Generalized anxiety disorder    Axis IV moderate  Axis V 65-70  Plan I review her psychosocial stressors including recent concern about her enlarged lymph node.  At this time patient does not want to cut down her Xanax.  I recommend to try increase Pamelor to 100 mg at bedtime to help her anxiety and insomnia.  She can come down her Xanax to only one at bedtime.  I will discontinue Vistaril.  Reassurance given.  She will see therapist for coping and social skills.  I recommend to call us if she is any question or concern about the medication if she feels worsening of the symptom.  I will see her again in 6 weeks.  Time spent 30 minutes.  Portion a personal time spent in counseling psychoeducation and coordination of care.  Glen Blatchley T., MD 02/25/2012

## 2012-02-26 ENCOUNTER — Ambulatory Visit: Payer: Self-pay | Admitting: Hematology and Oncology

## 2012-02-26 DIAGNOSIS — R599 Enlarged lymph nodes, unspecified: Secondary | ICD-10-CM | POA: Diagnosis not present

## 2012-02-26 LAB — CBC CANCER CENTER
Basophil %: 1 %
Eosinophil #: 0.2 x10 3/mm (ref 0.0–0.7)
Eosinophil %: 3.5 %
HGB: 13.3 g/dL (ref 12.0–16.0)
MCH: 34.1 pg — ABNORMAL HIGH (ref 26.0–34.0)
MCHC: 35.1 g/dL (ref 32.0–36.0)
Monocyte #: 0.6 x10 3/mm (ref 0.2–0.9)
Neutrophil #: 3.6 x10 3/mm (ref 1.4–6.5)

## 2012-02-26 LAB — COMPREHENSIVE METABOLIC PANEL
Anion Gap: 7 (ref 7–16)
BUN: 13 mg/dL (ref 7–18)
Calcium, Total: 9.9 mg/dL (ref 8.5–10.1)
EGFR (African American): >60
EGFR (Non-African Amer.): >60
Glucose: 84 mg/dL (ref 65–99)
Potassium: 3.8 mmol/L (ref 3.5–5.1)
SGOT(AST): 31 U/L (ref 15–37)
Sodium: 136 mmol/L (ref 136–145)
Total Protein: 8.3 g/dL — ABNORMAL HIGH (ref 6.4–8.2)

## 2012-03-09 ENCOUNTER — Encounter (HOSPITAL_COMMUNITY): Payer: Self-pay | Admitting: Licensed Clinical Social Worker

## 2012-03-09 ENCOUNTER — Ambulatory Visit (INDEPENDENT_AMBULATORY_CARE_PROVIDER_SITE_OTHER): Payer: Medicare Other | Admitting: Licensed Clinical Social Worker

## 2012-03-09 DIAGNOSIS — F411 Generalized anxiety disorder: Secondary | ICD-10-CM

## 2012-03-09 NOTE — Progress Notes (Signed)
   THERAPIST PROGRESS NOTE  Session Time: 11:30am-12:20pm  Participation Level: Active  Behavioral Response: Well GroomedAlertAnxious and Euthymic  Type of Therapy: Individual Therapy  Treatment Goals addressed: Coping  Interventions: Strength-based, Supportive and Reframing  Summary: Wanda Ochoa is a 66 y.o. female who presents with euthymic mood and anxious affect. She is pleased to report that her enlarged lymph nodes are not causing concern to the doctor and she will follow up with a CT scan in three months. She feels she has gotten very good at waiting due to how long it took for her daughter to be accurately diagnosed. She is pleased that her daughter is doing a little better and this past weekend was the first weekend both her and her husband were home together in months. When she finally stopped, she realized just how tired she is. She is angry with her granddaughter for causing her daughter difficulty and she plans to spend Christmas with her daughter just in case there is too much conflict with her granddaughter. 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 She misses this work and wants to find improved life balance now that her daughter is improving. She still wants to decrease her Xanax dose and has begun to drop this down. Her sleep and appetite are wnl.    Suicidal/Homicidal: Nowithout intent/plan  Therapist Response: Assessed patients current functioning and reviewed progress. Reviewed coping strategies. Assessed patients safety and assisted in identifying protective factors.  Reviewed crisis plan with patient. Assisted patient with the expression of her feelings of anxiety related to her daughters health. Patient is very committed to her daughters wellness in a healthy way. She will, however, need assistance creating and tolerating appropriate boundaries as her daughters health improves. Reviewed patients self care plan. Assessed   progress related to self care. [name] self care is good. Recommend proper diet, regular exercise, socialization and recreation.   Plan: Return again in four weeks.  Diagnosis: Axis I: Generalized Anxiety Disorder    Axis II: No diagnosis    Laiklyn Pilkenton, LCSW 03/09/2012

## 2012-03-25 ENCOUNTER — Ambulatory Visit: Payer: Self-pay | Admitting: Hematology and Oncology

## 2012-04-07 ENCOUNTER — Ambulatory Visit (HOSPITAL_COMMUNITY): Payer: Self-pay | Admitting: Psychiatry

## 2012-04-07 DIAGNOSIS — I10 Essential (primary) hypertension: Secondary | ICD-10-CM | POA: Diagnosis not present

## 2012-04-07 DIAGNOSIS — E782 Mixed hyperlipidemia: Secondary | ICD-10-CM | POA: Diagnosis not present

## 2012-04-07 DIAGNOSIS — I4949 Other premature depolarization: Secondary | ICD-10-CM | POA: Diagnosis not present

## 2012-04-08 ENCOUNTER — Ambulatory Visit (INDEPENDENT_AMBULATORY_CARE_PROVIDER_SITE_OTHER): Payer: Medicare Other | Admitting: Psychiatry

## 2012-04-08 ENCOUNTER — Ambulatory Visit (HOSPITAL_COMMUNITY): Payer: Self-pay | Admitting: Licensed Clinical Social Worker

## 2012-04-08 ENCOUNTER — Encounter (HOSPITAL_COMMUNITY): Payer: Self-pay | Admitting: Psychiatry

## 2012-04-08 VITALS — BP 171/86 | HR 77 | Wt 171.2 lb

## 2012-04-08 DIAGNOSIS — F411 Generalized anxiety disorder: Secondary | ICD-10-CM | POA: Diagnosis not present

## 2012-04-08 DIAGNOSIS — F419 Anxiety disorder, unspecified: Secondary | ICD-10-CM

## 2012-04-08 MED ORDER — CLONAZEPAM 0.5 MG PO TABS: 0.5000 mg | ORAL_TABLET | Freq: Every evening | ORAL | Status: DC | PRN

## 2012-04-08 MED ORDER — HYDROXYZINE PAMOATE 25 MG PO CAPS: 25.0000 mg | ORAL_CAPSULE | Freq: Every day | ORAL | Status: DC | PRN

## 2012-04-08 MED ORDER — NORTRIPTYLINE HCL 25 MG PO CAPS: ORAL_CAPSULE | ORAL | Status: DC

## 2012-04-08 NOTE — Progress Notes (Signed)
Patient ID: Wanda Ochoa, female   DOB: [birth year], 67 y.o.   MRN: [id number]  [facility] [address] Progress Note  Wanda Ochoa [id number] 67 y.o.  04/08/2012 4:13 PM  Chief Complaint: I still feel anxious and there are times when I take more than 2 Xanax.  I did not tried higher dose of Pamelor but I'm taking Vistaril during the day for anxiety if needed.    History of Present Illness:  Patient is 67 year old female who came for her followup appointment.  Patient reported overall she is feeling less anxious however she continued to have anxiety symptoms and some days she gets very upset.  Recently she is upset because her lymph node is continued to swell and oncologist does not want to do any biopsy.  She scheduled to see oncologist in March.  She's upset about the situation.  She's also very anxious but sometimes she does not remember very well.  She endorse that her daughter is doing somewhat better.  She start driving but she still goes every week to help her.  She scared to try Pamelor but realized that she need a higher dose.  She admitted taking Xanax some days 3 tablet to help her anxiety.  She sleeping better.  She denies any crying spells but she continues to have a lot of anxiety symptoms.  She had quite Christmas she spent time with her husband.  She denies any agitation anger mood swing.  She denies any active or passive suicidal thoughts.  She denies any hallucination or any paranoid thinking.  Suicidal Ideation: No Plan Formed: No Patient has means to carry out plan: No  Homicidal Ideation: No Plan Formed: No Patient has means to carry out plan: No  Review of Systems: Psychiatric: Agitation: No Hallucination: No Depressed Mood: Yes Insomnia: Yes Hypersomnia: No Altered Concentration: No Feels Worthless: No Grandiose Ideas: No Belief In Special Powers: No New/Increased Substance Abuse: No Compulsions: No  Neurologic: Headache: Yes Seizure:  No Paresthesias: No  Past Psychiatric History; patient endorse history of depression and anxiety since 1988.  Patient reported at that time her children were very sick.  Her 2 children died in 6 months apart with significant illness.  Patient admitted at that time she was very nervous anxious and depressed.  She developed weakness on her right side.  At that time she was recommended to see psychiatrist.  She saw Dr. Thomasenia Sales in [place].  She did very well on Pamelor.  She also tried gabapentin and trazodone but it did not help her anxiety and insomnia.  Her Xanax was added later as patient continued to endorse anxiety symptoms.  Patient has a history of suicidal attempt or any inpatient psychiatric treatment.  Patient denies any history of psychosis mania or violence or manic symptoms.  Medical History; patient has history of asthma, hypertension and recently her pulmonologist found enlarged lymph node in her abdominal.  She scheduled to see oncologist tomorrow in [place].  Her primary care physician is Dr. Elease Hashimoto in [place].  Family and Social History: Patient endorse her father committed suicide.  Her son has bipolar disorder and her sister has depression.  Patient is born and raised in [place].  She's been married and her husband is very supportive.  Patient has 3 children.  Outpatient Encounter Prescriptions as of 04/08/2012  Medication Sig Dispense Refill  . aspirin 81 MG tablet Take 81 mg by mouth daily.      Marland Kitchen CALCIUM PO Take 1  tablet by mouth daily.      Marland Kitchen ESTRACE VAGINAL 0.1 MG/GM vaginal cream as needed.       Marland Kitchen KRILL OIL PO Take 1 capsule by mouth daily.      Marland Kitchen lisinopril-hydrochlorothiazide (PRINZIDE,ZESTORETIC) 20-25 MG per tablet Take 1 tablet by mouth daily.       . metoprolol (LOPRESSOR) 50 MG tablet Take 50 mg by mouth 2 (two) times daily.       . Multiple Vitamin (MULTIVITAMIN) capsule Take 1 capsule by mouth daily.      . nortriptyline (PAMELOR) 25 MG  capsule Take 4 capsule at bed time  120 capsule  0  . SYNTHROID 75 MCG tablet Take 75 mcg by mouth daily.       . [DISCONTINUED] ALPRAZolam (XANAX) 0.5 MG tablet 2 at bedtime and as directed during the day      . [DISCONTINUED] nortriptyline (PAMELOR) 25 MG capsule Take 75 mg by mouth at bedtime.      . [DISCONTINUED] nortriptyline (PAMELOR) 25 MG capsule Take 1 capsule (25 mg total) by mouth at bedtime.  30 capsule  1  . albuterol (PROAIR HFA) 108 (90 BASE) MCG/ACT inhaler Inhale 2 puffs into the lungs every 6 (six) hours as needed.      . clonazePAM (KLONOPIN) 0.5 MG tablet Take 1 tablet (0.5 mg total) by mouth at bedtime as needed for anxiety.  30 tablet  0  . hydrOXYzine (VISTARIL) 25 MG capsule Take 1 capsule (25 mg total) by mouth daily as needed for anxiety.  30 capsule  0    No results found for this or any previous visit (from the past 72 hour(s)).  Past Psychiatric History/Hospitalization(s): Anxiety: Yes Bipolar Disorder: No Depression: Yes Mania: No Psychosis: No Schizophrenia: No Personality Disorder: No Hospitalization for psychiatric illness: No History of Electroconvulsive Shock Therapy: No Prior Suicide Attempts: No  Physical Exam: Constitutional:  BP 171/86  Pulse 77  Wt 171 lb 3.2 oz (77.656 kg)  Musculoskeletal: Strength & Muscle Tone: within normal limits Gait & Station: normal Patient leans: N/A  Review of Systems  Cardiovascular: Negative.   Neurological: Negative for dizziness, tingling, tremors, seizures and loss of consciousness.  Psychiatric/Behavioral: Positive for depression. Negative for suicidal ideas, hallucinations and substance abuse. The patient is nervous/anxious and has insomnia.    Mental Status Examination; patient is well groomed and well dressed female who appears to be in her his stated age.  She maintained good eye contact.  She's cooperative pleasant.  Her speech is clear and coherent.  Her thought processes are logical linear and  goal-directed.  She described her mood is anxious and her affect is constricted.  She denies any active or passive suicidal thoughts or homicidal thoughts.  There were no flight of ideas or loose association.  Her fund of knowledge is adequate.  She denies any auditory or visual hallucination.  There were no psychotic symptoms present at this time.  Her concentration and attention is fair.  There were no tremors or shakes present.  She's alert and oriented x3.  Her insight judgment and impulse control is okay.   Medical Decision Making (Choose Three): Review of Psycho-Social Stressors (1), Review and summation of old records (2), Established Problem, Worsening (2), New Problem, with no additional work-up planned (3), Review of Medication Regimen & Side Effects (2) and Review of New Medication or Change in Dosage (2)  Axis I anxiety disorder NOS  Axis II deferred  Axis III  Patient Active  Problem List  Diagnosis  . Nocturnal hypoxemia  . Abdominal mass  . Dyspnea  . Generalized anxiety disorder    Axis IV moderate  Axis V 65-70  Plan 1 more time I encourage her to try Pamelor 100 mg to help her anxiety and insomnia.  I explained that if she started to have any side effects including dry mouth constipation or urinary retention then we can cut down the dose.  I also recommend to try Klonopin at bedtime to help her anxiety and insomnia.  Today she can take Vistaril which is not sedating.  Patient is seeing therapist for coping and social skills.  I explain in detail risk and benefits of medication and recommend to call us if she is any question or concern if she feels worsening of the symptom.  I will see her again in 4 weeks.  Time spent 30 minutes.  More than 50% of the time spent in psycho education counseling and coordination of care.  Portion of this note is generated with voice dictation software and may contain typographical error.  Bravlio Luca T., MD 04/08/2012

## 2012-04-10 ENCOUNTER — Telehealth (HOSPITAL_COMMUNITY): Payer: Self-pay

## 2012-04-10 NOTE — Telephone Encounter (Signed)
Called returned to the patient.  Patient is still very anxious.  She feel Klonopin is not helping at bedtime.  I recommend to try Vistaril with the Klonopin at nighttime and take extra Vistaril a day for her anxiety.  I recommend to call us back if she still feels very anxious.

## 2012-04-14 ENCOUNTER — Ambulatory Visit (INDEPENDENT_AMBULATORY_CARE_PROVIDER_SITE_OTHER): Payer: Medicare Other | Admitting: Licensed Clinical Social Worker

## 2012-04-14 ENCOUNTER — Encounter (HOSPITAL_COMMUNITY): Payer: Self-pay | Admitting: Licensed Clinical Social Worker

## 2012-04-14 ENCOUNTER — Telehealth (HOSPITAL_COMMUNITY): Payer: Self-pay

## 2012-04-14 ENCOUNTER — Other Ambulatory Visit (HOSPITAL_COMMUNITY): Payer: Self-pay | Admitting: *Deleted

## 2012-04-14 DIAGNOSIS — F411 Generalized anxiety disorder: Secondary | ICD-10-CM

## 2012-04-14 MED ORDER — ALPRAZOLAM 0.5 MG PO TABS: 0.5000 mg | ORAL_TABLET | Freq: Every evening | ORAL | Status: DC | PRN

## 2012-04-14 NOTE — Telephone Encounter (Signed)
Dr.Arfeen instructed pt be contacted.Patient in office for appt with therapist, spoke with her here. Patient states she is taking Pamelor 100 mg at bedtime as directed- per Dr.Arfeen, blood levels would be ordered after patient has been on this dose for 3-4 weeks. Ask if she was taking Vistaril as directed--she has used Vistaril twice since last appt for anxiety during day, and it helped. She states Dr.Arfeen told her to inform him if any medicines did not work. She does not think Klonopin is going to work. Patient states she took Klonopin at bedtime as directed for 3 nights and was calm, but was unable to sleep,she was aware of everything and her mind was still thinking. After 3 nights of poor sleep she stopped Klonopin and returned to Xanax as she still has some prescribed by previous ZO:XWRUE 0.5 mg, taking 2 tablets at bedtime. Needs a prescription for Xanax.

## 2012-04-14 NOTE — Telephone Encounter (Signed)
Per Dr.Arfeen orders, Xanax 0.5 mg, take 1-2 at bedtime called to [facility] [facility], [place] per pt instructions

## 2012-04-14 NOTE — Progress Notes (Signed)
   THERAPIST PROGRESS NOTE  Session Time: 9:30am-10:20am  Participation Level: Active  Behavioral Response: Well GroomedAlertAnxious  Type of Therapy: Individual Therapy  Treatment Goals addressed: Coping  Interventions: CBT, Solution Focused, Strength-based, Supportive and Reframing  Summary: Wanda Ochoa is a 67 y.o. female who presents with anxious mood and affect. She is pleased to report that her daughters health continues to improve. Her and her husband are traveling less often to care for her. She endorses physical and mental exhaustion. She endorses anxiety regarding her use of Xanax. She did try Klonopin as directed by Dr. Lolly Mustache but it was ineffective and did not help her sleep. She endorses anxiety related to her return to work, catastrophic thinking and unreasonable focus on mistakes she makes. 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 She was fearful that they would do this on their legs and release a blood clot. She had to contact her boss and send out a email to the participants in order to calm her anxiety. She expresses concern over her granddaughter who she believes is using drugs. She seeks guidance about appropriate expression of her concern to her daughter. Her sleep and appetite are wnl. She exercises daily to relieve stress.    Suicidal/Homicidal: Nowithout intent/plan  Therapist Response: Assessed patients current functioning and reviewed progress. Reviewed coping strategies. Assessed patients safety and assisted in identifying protective factors.  Reviewed crisis plan with patient. Assisted patient with the expression of her feelings of anxieity. Patient demonstrates distorted and irrational thinking. She has a perfectionist personality and struggles with self blame. She is suffering from trauma related anxiety and hypervigilance related to her  daughters extended illness. Reviewed healthy boundaries and assertive communication. Discussed steps of self care, adjustment of expectations of self and reasonable expectations of others. .Used CBT to assist patient with the identification of negative distortions and irrational thoughts. Encouraged patient to verbalize alternative and factual responses which challenge thought distortions. Reviewed patients self care plan. Assessed  progress related to self care. [name] self care is good. Recommend proper diet, regular exercise, socialization and recreation.   Plan: Return again in two weeks.  Diagnosis: Axis I: Generalized Anxiety Disorder    Axis II: No diagnosis    Wanda Stiner, LCSW 04/14/2012

## 2012-04-14 NOTE — Addendum Note (Signed)
Addended by: Tonny Bollman on: 04/14/2012 01:44 PM   Modules accepted: Orders

## 2012-04-17 ENCOUNTER — Ambulatory Visit (INDEPENDENT_AMBULATORY_CARE_PROVIDER_SITE_OTHER): Payer: Medicare Other | Admitting: Psychiatry

## 2012-04-17 ENCOUNTER — Ambulatory Visit (HOSPITAL_COMMUNITY): Payer: Self-pay | Admitting: Psychiatry

## 2012-04-17 ENCOUNTER — Encounter (HOSPITAL_COMMUNITY): Payer: Self-pay | Admitting: Psychiatry

## 2012-04-17 VITALS — BP 146/78 | HR 64 | Wt 167.0 lb

## 2012-04-17 DIAGNOSIS — F411 Generalized anxiety disorder: Secondary | ICD-10-CM | POA: Diagnosis not present

## 2012-04-17 DIAGNOSIS — F419 Anxiety disorder, unspecified: Secondary | ICD-10-CM

## 2012-04-17 MED ORDER — HYDROXYZINE PAMOATE 25 MG PO CAPS: ORAL_CAPSULE | ORAL | Status: DC

## 2012-04-17 NOTE — Progress Notes (Signed)
[facility] [id number] Progress Note  Wanda Ochoa [id number] 67 y.o.  04/17/2012 10:29 AM  Chief Complaint:  I'm very anxious.  I stop taking Xanax and also Vistaril.  It is helping but I am worried that I may get sick again.    History of Present Illness:  Patient is 67 year old female who was recently seen in this office, she called and asked to see her earlier than her scheduled appointment.  2 days ago patient called and reported that Klonopin is not helping her sleep and requested to go back on Xanax.  We started Xanax 0.5 mg 1-2 tablet at bedtime for insomnia and anxiety.  He also increased Pamelor on her last visit we she is taking.  She continued to endorse increased anxiety and nervousness.  Today she brought list of the symptoms with her, she endorse feeling hypervigilant, forgetful, very obsessive about things and some time feeling paranoid.  She is very worried about her physical health.  She afraid that she may relapse into cancer.  She had a CT scan scheduled.  She denies any crying spells or any active or passive suicidal thoughts.  She felt that this didn't help however she does not want to get addicted on Xanax.  She sleeping better on Xanax.  She just started Pamelor 100 mg 2 days ago.  She admitted having poor attention poor concentration and sometime forgetting things.  She also getting easily irritable when she do not remember things very well.  She denies any side effects of medication.  She wants help to manage her medication.  Suicidal Ideation: No Plan Formed: No Patient has means to carry out plan: No  Homicidal Ideation: No Plan Formed: No Patient has means to carry out plan: No  Review of Systems: Psychiatric: Agitation: No Hallucination: No Depressed Mood: Yes Insomnia: Yes Hypersomnia: No Altered Concentration: No Feels Worthless: No Grandiose Ideas: No Belief In Special Powers: No New/Increased Substance Abuse: No Compulsions: No but sometime  feel obsessed  Neurologic: Headache: Yes Seizure: No Paresthesias: No  Past Psychiatric History; patient endorse history of depression and anxiety since 1988.  Patient reported at that time her children were very sick.  Her 2 children died in 6 months apart with significant illness.  Patient admitted at that time she was very nervous anxious and depressed.  She developed weakness on her right side.  At that time she was recommended to see psychiatrist.  She saw Dr. Thomasenia Sales in [place].  She did very well on Pamelor.  She also tried gabapentin and trazodone but it did not help her anxiety and insomnia.  Her Xanax was added later as patient continued to endorse anxiety symptoms.  Patient has a history of suicidal attempt or any inpatient psychiatric treatment.  Patient denies any history of psychosis mania or violence or manic symptoms.  Medical History; patient has history of asthma, hypertension and recently her pulmonologist found enlarged lymph node in her abdominal.  She scheduled to see oncologist tomorrow in [place].  Her primary care physician is Dr. Elease Hashimoto in [place].  Family and Social History: Patient endorse her father committed suicide.  Her son has bipolar disorder and her sister has depression.  Patient is born and raised in [place].  She's been married and her husband is very supportive.  Patient has 3 children.  Outpatient Encounter Prescriptions as of 04/17/2012  Medication Sig Dispense Refill  . ALPRAZolam (XANAX) 0.5 MG tablet Take 1-2 tablets (0.5-1 mg total) by mouth  at bedtime as needed for sleep.  60 tablet  0  . nortriptyline (PAMELOR) 25 MG capsule Take 4 capsule at bed time  120 capsule  0  . SYNTHROID 75 MCG tablet Take 75 mcg by mouth daily.       Marland Kitchen albuterol (PROAIR HFA) 108 (90 BASE) MCG/ACT inhaler Inhale 2 puffs into the lungs every 6 (six) hours as needed.      Marland Kitchen aspirin 81 MG tablet Take 81 mg by mouth daily.      Marland Kitchen CALCIUM PO Take 1  tablet by mouth daily.      Marland Kitchen ESTRACE VAGINAL 0.1 MG/GM vaginal cream as needed.       . hydrOXYzine (VISTARIL) 25 MG capsule Take 1-2 capsule every 6 hrs as needed  60 capsule  0  . KRILL OIL PO Take 1 capsule by mouth daily.      Marland Kitchen lisinopril-hydrochlorothiazide (PRINZIDE,ZESTORETIC) 20-25 MG per tablet Take 1 tablet by mouth daily.       . metoprolol (LOPRESSOR) 50 MG tablet Take 50 mg by mouth 2 (two) times daily.       . Multiple Vitamin (MULTIVITAMIN) capsule Take 1 capsule by mouth daily.      . [DISCONTINUED] hydrOXYzine (VISTARIL) 25 MG capsule Take 1 capsule (25 mg total) by mouth daily as needed for anxiety.  30 capsule  0    No results found for this or any previous visit (from the past 72 hour(s)).  Past Psychiatric History/Hospitalization(s): Anxiety: Yes Bipolar Disorder: No Depression: Yes Mania: No Psychosis: No Schizophrenia: No Personality Disorder: No Hospitalization for psychiatric illness: No History of Electroconvulsive Shock Therapy: No Prior Suicide Attempts: No  Physical Exam: Constitutional:  BP 146/78  Pulse 64  Wt 167 lb (75.751 kg)  Musculoskeletal: Strength & Muscle Tone: within normal limits Gait & Station: normal Patient leans: N/A  Review of Systems  Cardiovascular: Negative.   Neurological: Negative for dizziness, tingling, tremors, seizures and loss of consciousness.  Psychiatric/Behavioral: Positive for depression. Negative for suicidal ideas, hallucinations and substance abuse. The patient is nervous/anxious and has insomnia.    Mental Status Examination; patient is well groomed and well dressed female who appears to be in her his stated age.  She maintained fair eye contact.  She's is anxious but cooperative pleasant.  Her speech is clear and coherent.  Her thought processes sometimes circumstantial.  She described her mood is anxious and her affect is constricted.  She denies any active or passive suicidal thoughts or homicidal thoughts.   There were no flight of ideas or loose association however she admitted obsessive thoughts about getting sick again.  She endorse fearfulness and excessive worries.  Her fund of knowledge is adequate.  She denies any auditory or visual hallucination.  There were no psychotic symptoms present at this time.  Her concentration and attention is fair to poor.  There were no tremors or shakes present.  She's alert and oriented x3.  Her insight judgment and impulse control is okay.   Medical Decision Making (Choose Three): New problem, with additional work up planned, Review of Psycho-Social Stressors (1), Review and summation of old records (2), Established Problem, Worsening (2), Review of Medication Regimen & Side Effects (2) and Review of New Medication or Change in Dosage (2)  Axis I anxiety disorder NOS  Axis II deferred  Axis III  Patient Active Problem List  Diagnosis  . Nocturnal hypoxemia  . Abdominal mass  . Dyspnea  . Generalized anxiety disorder  Axis IV moderate  Axis V 65-70  Plan I do believe patient has been more anxious than usual.  She requested to see this writer earlier than scheduled appointment.  I offered to try a different antidepressant but patient is very reluctant to stay on Pamelor at this time.  She just started taking 100 mg 2 days ago.  Reassurance given, recommend to continue Xanax if it is helping.  I also recommend to take Vistaril 50 mg up to 3 times a day if needed.  At this time patient is tolerating her medication without any side effects.  I also recommend to think about the intensive outpatient program however patient works in the afternoon at [employer] and does not feel she needs program at this time.  She is seeing therapist for coping skills.  The patient does not improve in 2 weeks with increase Pamelor and taking Vistaril 3 times a day we will consider switching to a different antidepressant.  Patient agreed with the plan.  Time spent 25 minutes.  I will  see her again in 2 weeks.  Portion of this note is generated with voice dictation software and may contain typographical error.  Jacy Howat T., MD 04/17/2012

## 2012-04-27 ENCOUNTER — Telehealth (HOSPITAL_COMMUNITY): Payer: Self-pay | Admitting: Psychiatry

## 2012-04-27 NOTE — Telephone Encounter (Signed)
Call returned.  Patient told that she take more Vistaril she started to have noises in her ear.  She felt a low plane is flying.  She stopped taking Vistaril and the noises are much better.  She took 2 extra Xanax to calm herself each on Saturday and Sunday.  Today she is feeling better.  She still taking Xanax 2 tablet at bedtime.  She had a good night sleep.  I recommend to stop Vistaril completely until she see me on her appointment.  She acknowledged.

## 2012-04-27 NOTE — Telephone Encounter (Signed)
Message copied by Cleotis Nipper on Mon Apr 27, 2012  8:51 AM ------      Message from: Remus Loffler      Created: Thu Apr 23, 2012  4:41 PM      Regarding: Kooper Koci      Contact: [phone]       Patient has called and her anxiety is overwhelming. She is also hearing a constant humming noise that she is afraid is a hallucination. She wants to know if she can increase her Xanax dose to take some during the day.

## 2012-04-28 ENCOUNTER — Telehealth (HOSPITAL_COMMUNITY): Payer: Self-pay | Admitting: *Deleted

## 2012-04-28 MED ORDER — QUETIAPINE FUMARATE 25 MG PO TABS: ORAL_TABLET | ORAL | Status: DC

## 2012-04-28 NOTE — Addendum Note (Signed)
Addended by: Kathryne Sharper T on: 04/28/2012 03:26 PM   Modules accepted: Orders

## 2012-04-28 NOTE — Telephone Encounter (Signed)
Call returned.  Patient continued to endorse noise during the day and feels very anxious.  She feels exhausted with decreased energy.  Patient cannot differentiate that if this is hallucination or noise.  She has never taken antipsychotic medication but willing to try.  I recommend to try Seroquel 25 mg 1-2 tablet at bedtime to help these voices Vs hallucination and recommended take Xanax dependent day if needed.  Explained side effects and recommend to call us if she is any question or if she feels worsening of the symptoms.  I will call Seroquel 25 mg 1-2 tablet at bedtime her drugstore.

## 2012-04-28 NOTE — Telephone Encounter (Signed)
Patient called to report she continues to hear a sound like a low flying airplane. The sound is especially bothersome when it is quiet, but she hears it all the time. She states she thinks she may just be exhausted. She reports taking this week off from work because she is very tired from caring for her daughter and thought the rest would help. States she is no longer taking Vistaril - as of last Thursday. She is taking 2 Xanax and 3 Pamelor at bedtime and sleeps okay but it is during the day when she hears the plane and is hypersensitive to noise. She states there has been a timer on a lamp in the living room for a long time and she has now become aware of it ticking all day.  She states she is not looking to take  Xanax during the day, she is simply trying to get rid of the airplane noise and hypersensitivity to sounds, so would appreciate anything Dr.Arfeen could recommend.

## 2012-04-30 ENCOUNTER — Ambulatory Visit (INDEPENDENT_AMBULATORY_CARE_PROVIDER_SITE_OTHER): Payer: Medicare Other | Admitting: Licensed Clinical Social Worker

## 2012-04-30 DIAGNOSIS — F411 Generalized anxiety disorder: Secondary | ICD-10-CM

## 2012-04-30 NOTE — Progress Notes (Signed)
   THERAPIST PROGRESS NOTE  Session Time: 9:30am-10:20am  Participation Level: Active  Behavioral Response: Well GroomedAlertAnxious  Type of Therapy: Individual Therapy  Treatment Goals addressed: Anxiety and Coping  Interventions: CBT, Solution Focused, Strength-based, Supportive and Reframing  Summary: Wanda Ochoa is a 67 y.o. female who presents with anxious mood and affect. She reports increased frustration with her anxiety and reports waking up with strong feelings of panic. This morning, she woke at 5am with strong panic and took a Xanax which helped her. She endorses strong generalized anxiety and feels that she is just looking for something to be anxious about. While her daughter is doing much better, she remains anxious about her health and her ability to deal with her granddaughters problems at this time. 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 She has not worried about this in the past, but is now concerned that she is doing something wrong by not telling her clients about this. She is fearful that she is violating a religious belief. She needs strong reassurance and reframing. Her self doubt is strong. Her sleep is compromised and she is only able to sleep with the aid of medication. Her appetite is wnl.    Suicidal/Homicidal: Nowithout intent/plan  Therapist Response: Assessed patients current functioning and reviewed progress. Reviewed coping strategies. Assessed patients safety and assisted in identifying protective factors.  Reviewed crisis plan with patient. Assisted patient with the expression of her feelings of anxeity. Used CBT to assist patient with the identification of negative distortions and irrational thoughts. Encouraged patient to verbalize alternative and factual responses which challenge thought distortions. Reviewed prompting and reframing opportunities to challenge distorted internal dialogue. Used DBT to practice mindfulness,  review distraction list and improve distress tolerance skills. Reviewed patients self care plan. Assessed  progress related to self care. [name] self care is good. Recommend proper diet, regular exercise, socialization and recreation.    Plan: Return again in two weeks.  Diagnosis: Axis I: Generalized Anxiety Disorder    Axis II: No diagnosis    Jniya Madara, LCSW 04/30/2012

## 2012-05-04 ENCOUNTER — Telehealth (HOSPITAL_COMMUNITY): Payer: Self-pay | Admitting: Psychiatry

## 2012-05-04 NOTE — Telephone Encounter (Signed)
Patient is stopped taking Seroquel due to increased heart rate.  She took only one dose at night.  She's taking Pamelor to in the morning and 3 tablet at bedtime.  She is taking Xanax as usual.  She scheduled to see me on Thursday.  I recommend that we will discuss this issue more on her coming visit.

## 2012-05-04 NOTE — Telephone Encounter (Signed)
Message copied by Cleotis Nipper on Mon May 04, 2012  9:50 AM ------      Message from: Remus Loffler      Created: Mon May 04, 2012  8:32 AM      Regarding: Wanda Ochoa      Contact: [phone]       Received a message from patient this morning. She has questions about her medication. Please call her. Thank you.  ------

## 2012-05-06 ENCOUNTER — Ambulatory Visit (HOSPITAL_COMMUNITY): Payer: Self-pay | Admitting: Psychiatry

## 2012-05-12 ENCOUNTER — Ambulatory Visit (INDEPENDENT_AMBULATORY_CARE_PROVIDER_SITE_OTHER): Payer: Medicare Other | Admitting: Licensed Clinical Social Worker

## 2012-05-12 DIAGNOSIS — F411 Generalized anxiety disorder: Secondary | ICD-10-CM

## 2012-05-12 NOTE — Progress Notes (Signed)
   THERAPIST PROGRESS NOTE  Session Time: 11:30am-12:20pm  Participation Level: Active  Behavioral Response: Well GroomedAlertAnxious  Type of Therapy: Individual Therapy  Treatment Goals addressed: Anxiety and Coping  Interventions: CBT, Strength-based, Supportive and Reframing  Summary: Wanda Ochoa is a 67 y.o. female who presents with anxious mood and affect. She reports improvement in the [facility] she was experiencing last week. Her sleep has improved and she is better able to relax and rest during the day. She remains fearful that she will develop an addiction to Xanax and struggles to reframe her thinking to better reflect the reality that she needs this medication at this time. 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 She is relieved by this decision and wants to focus on getting well. She discusses an episode similar to this 12 years ago where she became delusional with AH. It appears that patient had a manic episode ending in psychosis and that currently, she is trying to avoid the same thing occurring again. She does endorse racing thoughts, difficulty concentrating and focusing. She is able to sleep, but only with medication. She denies any impulsivity, but does endorse hypervigilance related to the trauma of caring for her daughter. Her appetite is wnl.    Suicidal/Homicidal: Nowithout intent/plan  Therapist Response: Assessed patients current functioning and reviewed progress. Reviewed coping strategies. Assessed patients safety and assisted in identifying protective factors.  Reviewed crisis plan with patient. Assisted patient with the expression of her feelings of anxiety. Used CBT to assist patient with the identification of negative distortions and irrational thoughts. Encouraged patient to verbalize alternative and factual responses which challenge thought distortions. Reviewed prompting and reframing opportunities to challenge distorted internal dialogue.  [name] hypervigilance is manifested in her concern that she has used a pendulum at work and is fearful that she needs to disclose this to patients. Worked with patient to reduce her anxiety related to this and to more accurately identify a reasonable response which does not involve prior clients. Reviewed patients self care plan. Assessed  progress related to self care. [name] self care is good. Recommend proper diet, regular exercise, socialization and recreation.   Plan: Return again in two weeks.  Diagnosis: Axis I: Generalized Anxiety Disorder    Axis II: No diagnosis    Kendal Raffo, LCSW 05/12/2012

## 2012-05-13 DIAGNOSIS — F331 Major depressive disorder, recurrent, moderate: Secondary | ICD-10-CM | POA: Diagnosis not present

## 2012-05-18 ENCOUNTER — Emergency Department: Payer: Self-pay | Admitting: Emergency Medicine

## 2012-05-18 DIAGNOSIS — I10 Essential (primary) hypertension: Secondary | ICD-10-CM | POA: Diagnosis not present

## 2012-05-18 DIAGNOSIS — Z880 Allergy status to penicillin: Secondary | ICD-10-CM | POA: Diagnosis not present

## 2012-05-18 DIAGNOSIS — Z7982 Long term (current) use of aspirin: Secondary | ICD-10-CM | POA: Diagnosis not present

## 2012-05-18 DIAGNOSIS — Z79899 Other long term (current) drug therapy: Secondary | ICD-10-CM | POA: Diagnosis not present

## 2012-05-18 DIAGNOSIS — S61209A Unspecified open wound of unspecified finger without damage to nail, initial encounter: Secondary | ICD-10-CM | POA: Diagnosis not present

## 2012-05-22 DIAGNOSIS — H905 Unspecified sensorineural hearing loss: Secondary | ICD-10-CM | POA: Diagnosis not present

## 2012-05-22 DIAGNOSIS — H6980 Other specified disorders of Eustachian tube, unspecified ear: Secondary | ICD-10-CM | POA: Diagnosis not present

## 2012-05-22 DIAGNOSIS — H9319 Tinnitus, unspecified ear: Secondary | ICD-10-CM | POA: Diagnosis not present

## 2012-05-22 DIAGNOSIS — J301 Allergic rhinitis due to pollen: Secondary | ICD-10-CM | POA: Diagnosis not present

## 2012-05-22 DIAGNOSIS — H903 Sensorineural hearing loss, bilateral: Secondary | ICD-10-CM | POA: Diagnosis not present

## 2012-05-23 ENCOUNTER — Ambulatory Visit: Payer: Self-pay | Admitting: Hematology and Oncology

## 2012-05-23 DIAGNOSIS — R935 Abnormal findings on diagnostic imaging of other abdominal regions, including retroperitoneum: Secondary | ICD-10-CM | POA: Diagnosis not present

## 2012-05-23 DIAGNOSIS — R599 Enlarged lymph nodes, unspecified: Secondary | ICD-10-CM | POA: Diagnosis not present

## 2012-05-23 DIAGNOSIS — G4730 Sleep apnea, unspecified: Secondary | ICD-10-CM | POA: Diagnosis not present

## 2012-05-23 DIAGNOSIS — Z79899 Other long term (current) drug therapy: Secondary | ICD-10-CM | POA: Diagnosis not present

## 2012-05-25 DIAGNOSIS — F4323 Adjustment disorder with mixed anxiety and depressed mood: Secondary | ICD-10-CM | POA: Diagnosis not present

## 2012-05-27 DIAGNOSIS — Z01419 Encounter for gynecological examination (general) (routine) without abnormal findings: Secondary | ICD-10-CM | POA: Diagnosis not present

## 2012-05-27 DIAGNOSIS — E039 Hypothyroidism, unspecified: Secondary | ICD-10-CM | POA: Diagnosis not present

## 2012-05-27 DIAGNOSIS — N3289 Other specified disorders of bladder: Secondary | ICD-10-CM | POA: Diagnosis not present

## 2012-05-27 DIAGNOSIS — N390 Urinary tract infection, site not specified: Secondary | ICD-10-CM | POA: Diagnosis not present

## 2012-05-27 DIAGNOSIS — F331 Major depressive disorder, recurrent, moderate: Secondary | ICD-10-CM | POA: Diagnosis not present

## 2012-05-31 ENCOUNTER — Emergency Department: Payer: Self-pay | Admitting: Emergency Medicine

## 2012-06-02 ENCOUNTER — Ambulatory Visit: Payer: Self-pay | Admitting: Hematology and Oncology

## 2012-06-02 DIAGNOSIS — R599 Enlarged lymph nodes, unspecified: Secondary | ICD-10-CM | POA: Diagnosis not present

## 2012-06-02 LAB — CREATININE, SERUM
Creatinine: 1.09 mg/dL (ref 0.60–1.30)
EGFR (African American): >60

## 2012-06-04 DIAGNOSIS — F331 Major depressive disorder, recurrent, moderate: Secondary | ICD-10-CM | POA: Diagnosis not present

## 2012-06-05 DIAGNOSIS — R599 Enlarged lymph nodes, unspecified: Secondary | ICD-10-CM | POA: Diagnosis not present

## 2012-06-05 LAB — CBC CANCER CENTER
Basophil %: 0.9 %
Eosinophil %: 2.7 %
HCT: 36 % (ref 35.0–47.0)
HGB: 12.8 g/dL (ref 12.0–16.0)
MCH: 34.3 pg — ABNORMAL HIGH (ref 26.0–34.0)
Monocyte #: 0.5 x10 3/mm (ref 0.2–0.9)
Monocyte %: 10.1 %
Neutrophil #: 2.7 x10 3/mm (ref 1.4–6.5)
Neutrophil %: 57.9 %
RBC: 3.71 10*6/uL — ABNORMAL LOW (ref 3.80–5.20)
RDW: 12.1 % (ref 11.5–14.5)

## 2012-06-05 LAB — SEDIMENTATION RATE: Erythrocyte Sed Rate: 28 mm/hr (ref 0–30)

## 2012-06-05 LAB — COMPREHENSIVE METABOLIC PANEL
Albumin: 3.8 g/dL (ref 3.4–5.0)
Alkaline Phosphatase: 80 U/L (ref 50–136)
BUN: 12 mg/dL (ref 7–18)
Calcium, Total: 9.1 mg/dL (ref 8.5–10.1)
Chloride: 95 mmol/L — ABNORMAL LOW (ref 98–107)
EGFR (African American): 59 — ABNORMAL LOW
EGFR (Non-African Amer.): 51 — ABNORMAL LOW
Total Protein: 7.7 g/dL (ref 6.4–8.2)

## 2012-06-05 LAB — LACTATE DEHYDROGENASE: LDH: 153 U/L (ref 81–246)

## 2012-06-08 DIAGNOSIS — H9319 Tinnitus, unspecified ear: Secondary | ICD-10-CM | POA: Diagnosis not present

## 2012-06-08 DIAGNOSIS — H903 Sensorineural hearing loss, bilateral: Secondary | ICD-10-CM | POA: Diagnosis not present

## 2012-06-08 DIAGNOSIS — F4323 Adjustment disorder with mixed anxiety and depressed mood: Secondary | ICD-10-CM | POA: Diagnosis not present

## 2012-06-08 DIAGNOSIS — H905 Unspecified sensorineural hearing loss: Secondary | ICD-10-CM | POA: Diagnosis not present

## 2012-06-18 DIAGNOSIS — F331 Major depressive disorder, recurrent, moderate: Secondary | ICD-10-CM | POA: Diagnosis not present

## 2012-06-23 ENCOUNTER — Ambulatory Visit: Payer: Self-pay | Admitting: Hematology and Oncology

## 2012-06-24 DIAGNOSIS — F4323 Adjustment disorder with mixed anxiety and depressed mood: Secondary | ICD-10-CM | POA: Diagnosis not present

## 2012-07-07 DIAGNOSIS — J309 Allergic rhinitis, unspecified: Secondary | ICD-10-CM | POA: Diagnosis not present

## 2012-07-07 DIAGNOSIS — F331 Major depressive disorder, recurrent, moderate: Secondary | ICD-10-CM | POA: Diagnosis not present

## 2012-07-07 DIAGNOSIS — J45909 Unspecified asthma, uncomplicated: Secondary | ICD-10-CM | POA: Diagnosis not present

## 2012-07-13 DIAGNOSIS — F4323 Adjustment disorder with mixed anxiety and depressed mood: Secondary | ICD-10-CM | POA: Diagnosis not present

## 2012-08-05 DIAGNOSIS — F331 Major depressive disorder, recurrent, moderate: Secondary | ICD-10-CM | POA: Diagnosis not present

## 2012-08-21 DIAGNOSIS — F331 Major depressive disorder, recurrent, moderate: Secondary | ICD-10-CM | POA: Diagnosis not present

## 2012-09-11 DIAGNOSIS — F331 Major depressive disorder, recurrent, moderate: Secondary | ICD-10-CM | POA: Diagnosis not present

## 2012-09-13 ENCOUNTER — Emergency Department: Payer: Self-pay | Admitting: Emergency Medicine

## 2012-09-13 DIAGNOSIS — F3289 Other specified depressive episodes: Secondary | ICD-10-CM | POA: Diagnosis not present

## 2012-09-13 DIAGNOSIS — K5900 Constipation, unspecified: Secondary | ICD-10-CM | POA: Diagnosis not present

## 2012-09-13 DIAGNOSIS — K5641 Fecal impaction: Secondary | ICD-10-CM | POA: Diagnosis not present

## 2012-09-13 DIAGNOSIS — R45851 Suicidal ideations: Secondary | ICD-10-CM | POA: Diagnosis not present

## 2012-09-14 ENCOUNTER — Inpatient Hospital Stay: Payer: Self-pay | Admitting: Psychiatry

## 2012-09-14 DIAGNOSIS — K5900 Constipation, unspecified: Secondary | ICD-10-CM | POA: Diagnosis present

## 2012-09-14 DIAGNOSIS — Z880 Allergy status to penicillin: Secondary | ICD-10-CM | POA: Diagnosis not present

## 2012-09-14 DIAGNOSIS — E860 Dehydration: Secondary | ICD-10-CM | POA: Diagnosis present

## 2012-09-14 DIAGNOSIS — J449 Chronic obstructive pulmonary disease, unspecified: Secondary | ICD-10-CM | POA: Diagnosis present

## 2012-09-14 DIAGNOSIS — R4182 Altered mental status, unspecified: Secondary | ICD-10-CM | POA: Diagnosis not present

## 2012-09-14 DIAGNOSIS — F411 Generalized anxiety disorder: Secondary | ICD-10-CM | POA: Diagnosis present

## 2012-09-14 DIAGNOSIS — F329 Major depressive disorder, single episode, unspecified: Secondary | ICD-10-CM | POA: Diagnosis not present

## 2012-09-14 DIAGNOSIS — Z72820 Sleep deprivation: Secondary | ICD-10-CM | POA: Diagnosis not present

## 2012-09-14 DIAGNOSIS — F312 Bipolar disorder, current episode manic severe with psychotic features: Secondary | ICD-10-CM | POA: Diagnosis not present

## 2012-09-14 DIAGNOSIS — Z79899 Other long term (current) drug therapy: Secondary | ICD-10-CM | POA: Diagnosis not present

## 2012-09-14 DIAGNOSIS — I10 Essential (primary) hypertension: Secondary | ICD-10-CM | POA: Diagnosis present

## 2012-09-14 DIAGNOSIS — R000 Tachycardia, unspecified: Secondary | ICD-10-CM | POA: Diagnosis not present

## 2012-09-14 DIAGNOSIS — F339 Major depressive disorder, recurrent, unspecified: Secondary | ICD-10-CM | POA: Diagnosis not present

## 2012-09-14 DIAGNOSIS — E039 Hypothyroidism, unspecified: Secondary | ICD-10-CM | POA: Diagnosis present

## 2012-09-14 DIAGNOSIS — R45851 Suicidal ideations: Secondary | ICD-10-CM | POA: Diagnosis not present

## 2012-09-14 DIAGNOSIS — K5641 Fecal impaction: Secondary | ICD-10-CM | POA: Diagnosis not present

## 2012-09-14 DIAGNOSIS — F332 Major depressive disorder, recurrent severe without psychotic features: Secondary | ICD-10-CM | POA: Diagnosis not present

## 2012-09-14 DIAGNOSIS — R918 Other nonspecific abnormal finding of lung field: Secondary | ICD-10-CM | POA: Diagnosis not present

## 2012-09-14 DIAGNOSIS — F333 Major depressive disorder, recurrent, severe with psychotic symptoms: Secondary | ICD-10-CM | POA: Diagnosis not present

## 2012-09-14 DIAGNOSIS — Z9119 Patient's noncompliance with other medical treatment and regimen: Secondary | ICD-10-CM | POA: Diagnosis not present

## 2012-09-14 LAB — COMPREHENSIVE METABOLIC PANEL
Bilirubin,Total: 0.5 mg/dL (ref 0.2–1.0)
Calcium, Total: 9.2 mg/dL (ref 8.5–10.1)
Creatinine: 1.01 mg/dL (ref 0.60–1.30)
EGFR (Non-African Amer.): 58 — ABNORMAL LOW
Osmolality: 273 (ref 275–301)
SGOT(AST): 34 U/L (ref 15–37)
SGPT (ALT): 23 U/L (ref 12–78)
Sodium: 136 mmol/L (ref 136–145)
Total Protein: 8.3 g/dL — ABNORMAL HIGH (ref 6.4–8.2)

## 2012-09-14 LAB — URINALYSIS, COMPLETE
Bacteria: NONE SEEN
Bilirubin,UR: NEGATIVE
Glucose,UR: NEGATIVE mg/dL (ref 0–75)
Protein: NEGATIVE
RBC,UR: 4 /HPF (ref 0–5)
WBC UR: 6 /HPF (ref 0–5)

## 2012-09-14 LAB — CBC
MCH: 34.5 pg — ABNORMAL HIGH (ref 26.0–34.0)
MCHC: 35 g/dL (ref 32.0–36.0)
Platelet: 240 10*3/uL (ref 150–440)
RDW: 12.8 % (ref 11.5–14.5)
WBC: 5.8 10*3/uL (ref 3.6–11.0)

## 2012-09-14 LAB — TSH: Thyroid Stimulating Horm: 2.47 u[IU]/mL

## 2012-09-14 LAB — DRUG SCREEN, URINE
Cannabinoid 50 Ng, Ur ~~LOC~~: NEGATIVE (ref ?–50)
Phencyclidine (PCP) Ur S: NEGATIVE (ref ?–25)

## 2012-09-14 LAB — ACETAMINOPHEN LEVEL: Acetaminophen: <2 ug/mL

## 2012-09-15 LAB — BEHAVIORAL MEDICINE 1 PANEL
Albumin: 3.9 g/dL (ref 3.4–5.0)
Anion Gap: 10 (ref 7–16)
Bilirubin,Total: 0.9 mg/dL (ref 0.2–1.0)
Calcium, Total: 9.3 mg/dL (ref 8.5–10.1)
Chloride: 99 mmol/L (ref 98–107)
Creatinine: 0.69 mg/dL (ref 0.60–1.30)
EGFR (Non-African Amer.): >60
Eosinophil #: 0 10*3/uL (ref 0.0–0.7)
Glucose: 108 mg/dL — ABNORMAL HIGH (ref 65–99)
HGB: 13.5 g/dL (ref 12.0–16.0)
Lymphocyte #: 1.1 10*3/uL (ref 1.0–3.6)
Lymphocyte %: 9.1 %
Monocyte %: 7.8 %
Neutrophil #: 9.8 10*3/uL — ABNORMAL HIGH (ref 1.4–6.5)
Neutrophil %: 82.7 %
Osmolality: 272 (ref 275–301)
RDW: 12.8 % (ref 11.5–14.5)
SGOT(AST): 27 U/L (ref 15–37)
Sodium: 136 mmol/L (ref 136–145)
Thyroid Stimulating Horm: 2.23 u[IU]/mL

## 2012-09-16 LAB — BASIC METABOLIC PANEL
Calcium, Total: 10.1 mg/dL (ref 8.5–10.1)
Chloride: 98 mmol/L (ref 98–107)
Creatinine: 1.36 mg/dL — ABNORMAL HIGH (ref 0.60–1.30)
EGFR (African American): 47 — ABNORMAL LOW
Glucose: 97 mg/dL (ref 65–99)
Osmolality: 275 (ref 275–301)
Potassium: 3.7 mmol/L (ref 3.5–5.1)
Sodium: 134 mmol/L — ABNORMAL LOW (ref 136–145)

## 2012-09-23 ENCOUNTER — Ambulatory Visit: Payer: Self-pay | Admitting: Psychiatry

## 2012-09-23 DIAGNOSIS — F339 Major depressive disorder, recurrent, unspecified: Secondary | ICD-10-CM | POA: Diagnosis not present

## 2012-09-29 DIAGNOSIS — F331 Major depressive disorder, recurrent, moderate: Secondary | ICD-10-CM | POA: Diagnosis not present

## 2012-09-30 DIAGNOSIS — F339 Major depressive disorder, recurrent, unspecified: Secondary | ICD-10-CM | POA: Diagnosis not present

## 2012-09-30 DIAGNOSIS — F3160 Bipolar disorder, current episode mixed, unspecified: Secondary | ICD-10-CM | POA: Diagnosis not present

## 2012-10-06 DIAGNOSIS — F331 Major depressive disorder, recurrent, moderate: Secondary | ICD-10-CM | POA: Diagnosis not present

## 2012-10-21 DIAGNOSIS — F339 Major depressive disorder, recurrent, unspecified: Secondary | ICD-10-CM | POA: Diagnosis not present

## 2012-10-23 ENCOUNTER — Ambulatory Visit: Payer: Self-pay | Admitting: Psychiatry

## 2012-10-23 DIAGNOSIS — F339 Major depressive disorder, recurrent, unspecified: Secondary | ICD-10-CM | POA: Diagnosis not present

## 2012-10-28 DIAGNOSIS — F331 Major depressive disorder, recurrent, moderate: Secondary | ICD-10-CM | POA: Diagnosis not present

## 2012-11-11 DIAGNOSIS — F332 Major depressive disorder, recurrent severe without psychotic features: Secondary | ICD-10-CM | POA: Diagnosis not present

## 2012-11-11 DIAGNOSIS — F312 Bipolar disorder, current episode manic severe with psychotic features: Secondary | ICD-10-CM | POA: Diagnosis not present

## 2012-11-11 DIAGNOSIS — F339 Major depressive disorder, recurrent, unspecified: Secondary | ICD-10-CM | POA: Diagnosis not present

## 2012-11-18 DIAGNOSIS — F331 Major depressive disorder, recurrent, moderate: Secondary | ICD-10-CM | POA: Diagnosis not present

## 2012-11-19 DIAGNOSIS — F331 Major depressive disorder, recurrent, moderate: Secondary | ICD-10-CM | POA: Diagnosis not present

## 2012-11-23 ENCOUNTER — Ambulatory Visit: Payer: Self-pay | Admitting: Hematology and Oncology

## 2012-12-02 ENCOUNTER — Ambulatory Visit: Payer: Self-pay | Admitting: Psychiatry

## 2012-12-02 DIAGNOSIS — F312 Bipolar disorder, current episode manic severe with psychotic features: Secondary | ICD-10-CM | POA: Diagnosis not present

## 2012-12-02 DIAGNOSIS — F339 Major depressive disorder, recurrent, unspecified: Secondary | ICD-10-CM | POA: Diagnosis not present

## 2012-12-03 ENCOUNTER — Ambulatory Visit: Payer: Self-pay | Admitting: Family Medicine

## 2012-12-03 DIAGNOSIS — Z1231 Encounter for screening mammogram for malignant neoplasm of breast: Secondary | ICD-10-CM | POA: Diagnosis not present

## 2012-12-07 ENCOUNTER — Ambulatory Visit: Payer: Self-pay | Admitting: Hematology and Oncology

## 2012-12-07 DIAGNOSIS — R599 Enlarged lymph nodes, unspecified: Secondary | ICD-10-CM | POA: Diagnosis not present

## 2012-12-07 DIAGNOSIS — K8020 Calculus of gallbladder without cholecystitis without obstruction: Secondary | ICD-10-CM | POA: Diagnosis not present

## 2012-12-08 DIAGNOSIS — F331 Major depressive disorder, recurrent, moderate: Secondary | ICD-10-CM | POA: Diagnosis not present

## 2012-12-10 ENCOUNTER — Ambulatory Visit: Payer: Self-pay | Admitting: Hematology and Oncology

## 2012-12-10 DIAGNOSIS — N289 Disorder of kidney and ureter, unspecified: Secondary | ICD-10-CM | POA: Diagnosis not present

## 2012-12-10 DIAGNOSIS — R599 Enlarged lymph nodes, unspecified: Secondary | ICD-10-CM | POA: Diagnosis not present

## 2012-12-10 DIAGNOSIS — Z79899 Other long term (current) drug therapy: Secondary | ICD-10-CM | POA: Diagnosis not present

## 2012-12-10 DIAGNOSIS — Z7982 Long term (current) use of aspirin: Secondary | ICD-10-CM | POA: Diagnosis not present

## 2012-12-10 DIAGNOSIS — Z90710 Acquired absence of both cervix and uterus: Secondary | ICD-10-CM | POA: Diagnosis not present

## 2012-12-10 DIAGNOSIS — G4730 Sleep apnea, unspecified: Secondary | ICD-10-CM | POA: Diagnosis not present

## 2012-12-10 DIAGNOSIS — F438 Other reactions to severe stress: Secondary | ICD-10-CM | POA: Diagnosis not present

## 2012-12-16 DIAGNOSIS — I059 Rheumatic mitral valve disease, unspecified: Secondary | ICD-10-CM | POA: Diagnosis not present

## 2012-12-16 DIAGNOSIS — I10 Essential (primary) hypertension: Secondary | ICD-10-CM | POA: Diagnosis not present

## 2012-12-16 DIAGNOSIS — E785 Hyperlipidemia, unspecified: Secondary | ICD-10-CM | POA: Diagnosis not present

## 2012-12-16 DIAGNOSIS — I4949 Other premature depolarization: Secondary | ICD-10-CM | POA: Diagnosis not present

## 2012-12-23 ENCOUNTER — Ambulatory Visit: Payer: Self-pay | Admitting: Hematology and Oncology

## 2012-12-23 ENCOUNTER — Ambulatory Visit: Payer: Self-pay | Admitting: Psychiatry

## 2012-12-23 DIAGNOSIS — F339 Major depressive disorder, recurrent, unspecified: Secondary | ICD-10-CM | POA: Diagnosis not present

## 2013-01-01 DIAGNOSIS — F331 Major depressive disorder, recurrent, moderate: Secondary | ICD-10-CM | POA: Diagnosis not present

## 2013-01-13 DIAGNOSIS — F312 Bipolar disorder, current episode manic severe with psychotic features: Secondary | ICD-10-CM | POA: Diagnosis not present

## 2013-01-13 DIAGNOSIS — F339 Major depressive disorder, recurrent, unspecified: Secondary | ICD-10-CM | POA: Diagnosis not present

## 2013-01-15 DIAGNOSIS — E782 Mixed hyperlipidemia: Secondary | ICD-10-CM | POA: Diagnosis not present

## 2013-01-15 DIAGNOSIS — I119 Hypertensive heart disease without heart failure: Secondary | ICD-10-CM | POA: Diagnosis not present

## 2013-01-15 DIAGNOSIS — I4949 Other premature depolarization: Secondary | ICD-10-CM | POA: Diagnosis not present

## 2013-01-15 DIAGNOSIS — I495 Sick sinus syndrome: Secondary | ICD-10-CM | POA: Diagnosis not present

## 2013-01-18 DIAGNOSIS — F329 Major depressive disorder, single episode, unspecified: Secondary | ICD-10-CM | POA: Diagnosis not present

## 2013-01-18 DIAGNOSIS — Z0000 Encounter for general adult medical examination without abnormal findings: Secondary | ICD-10-CM | POA: Diagnosis not present

## 2013-01-18 DIAGNOSIS — E871 Hypo-osmolality and hyponatremia: Secondary | ICD-10-CM | POA: Diagnosis not present

## 2013-01-18 DIAGNOSIS — R319 Hematuria, unspecified: Secondary | ICD-10-CM | POA: Diagnosis not present

## 2013-02-01 DIAGNOSIS — F331 Major depressive disorder, recurrent, moderate: Secondary | ICD-10-CM | POA: Diagnosis not present

## 2013-02-26 DIAGNOSIS — Z86010 Personal history of colonic polyps: Secondary | ICD-10-CM | POA: Diagnosis not present

## 2013-02-26 DIAGNOSIS — E039 Hypothyroidism, unspecified: Secondary | ICD-10-CM | POA: Diagnosis not present

## 2013-03-08 DIAGNOSIS — F331 Major depressive disorder, recurrent, moderate: Secondary | ICD-10-CM | POA: Diagnosis not present

## 2013-03-15 DIAGNOSIS — I119 Hypertensive heart disease without heart failure: Secondary | ICD-10-CM | POA: Diagnosis not present

## 2013-03-15 DIAGNOSIS — I495 Sick sinus syndrome: Secondary | ICD-10-CM | POA: Diagnosis not present

## 2013-03-15 DIAGNOSIS — I4949 Other premature depolarization: Secondary | ICD-10-CM | POA: Diagnosis not present

## 2013-03-15 DIAGNOSIS — E782 Mixed hyperlipidemia: Secondary | ICD-10-CM | POA: Diagnosis not present

## 2013-04-09 ENCOUNTER — Ambulatory Visit: Payer: Self-pay | Admitting: Gastroenterology

## 2013-04-09 DIAGNOSIS — Z800 Family history of malignant neoplasm of digestive organs: Secondary | ICD-10-CM | POA: Diagnosis not present

## 2013-04-09 DIAGNOSIS — Q438 Other specified congenital malformations of intestine: Secondary | ICD-10-CM | POA: Diagnosis not present

## 2013-04-09 DIAGNOSIS — E039 Hypothyroidism, unspecified: Secondary | ICD-10-CM | POA: Diagnosis not present

## 2013-04-09 DIAGNOSIS — Z860100 Personal history of colon polyps, unspecified: Secondary | ICD-10-CM | POA: Diagnosis not present

## 2013-04-09 DIAGNOSIS — Z880 Allergy status to penicillin: Secondary | ICD-10-CM | POA: Diagnosis not present

## 2013-04-09 DIAGNOSIS — I10 Essential (primary) hypertension: Secondary | ICD-10-CM | POA: Diagnosis not present

## 2013-04-09 DIAGNOSIS — Z79899 Other long term (current) drug therapy: Secondary | ICD-10-CM | POA: Diagnosis not present

## 2013-04-09 DIAGNOSIS — Z09 Encounter for follow-up examination after completed treatment for conditions other than malignant neoplasm: Secondary | ICD-10-CM | POA: Diagnosis not present

## 2013-04-09 LAB — HM COLONOSCOPY

## 2013-04-16 DIAGNOSIS — F331 Major depressive disorder, recurrent, moderate: Secondary | ICD-10-CM | POA: Diagnosis not present

## 2013-05-17 DIAGNOSIS — I4949 Other premature depolarization: Secondary | ICD-10-CM | POA: Diagnosis not present

## 2013-05-17 DIAGNOSIS — I119 Hypertensive heart disease without heart failure: Secondary | ICD-10-CM | POA: Diagnosis not present

## 2013-05-17 DIAGNOSIS — I059 Rheumatic mitral valve disease, unspecified: Secondary | ICD-10-CM | POA: Diagnosis not present

## 2013-05-17 DIAGNOSIS — I495 Sick sinus syndrome: Secondary | ICD-10-CM | POA: Diagnosis not present

## 2013-05-31 DIAGNOSIS — F331 Major depressive disorder, recurrent, moderate: Secondary | ICD-10-CM | POA: Diagnosis not present

## 2013-06-02 ENCOUNTER — Ambulatory Visit: Payer: Self-pay | Admitting: Hematology and Oncology

## 2013-06-02 DIAGNOSIS — C8589 Other specified types of non-Hodgkin lymphoma, extranodal and solid organ sites: Secondary | ICD-10-CM | POA: Diagnosis not present

## 2013-06-02 DIAGNOSIS — I517 Cardiomegaly: Secondary | ICD-10-CM | POA: Diagnosis not present

## 2013-06-02 DIAGNOSIS — I2510 Atherosclerotic heart disease of native coronary artery without angina pectoris: Secondary | ICD-10-CM | POA: Diagnosis not present

## 2013-06-02 DIAGNOSIS — I2789 Other specified pulmonary heart diseases: Secondary | ICD-10-CM | POA: Diagnosis not present

## 2013-06-02 DIAGNOSIS — K8020 Calculus of gallbladder without cholecystitis without obstruction: Secondary | ICD-10-CM | POA: Diagnosis not present

## 2013-06-03 ENCOUNTER — Ambulatory Visit: Payer: Self-pay | Admitting: Hematology and Oncology

## 2013-06-03 DIAGNOSIS — Z7982 Long term (current) use of aspirin: Secondary | ICD-10-CM | POA: Diagnosis not present

## 2013-06-03 DIAGNOSIS — R0902 Hypoxemia: Secondary | ICD-10-CM | POA: Diagnosis not present

## 2013-06-03 DIAGNOSIS — Q619 Cystic kidney disease, unspecified: Secondary | ICD-10-CM | POA: Diagnosis not present

## 2013-06-03 DIAGNOSIS — Z800 Family history of malignant neoplasm of digestive organs: Secondary | ICD-10-CM | POA: Diagnosis not present

## 2013-06-03 DIAGNOSIS — Z79899 Other long term (current) drug therapy: Secondary | ICD-10-CM | POA: Diagnosis not present

## 2013-06-03 DIAGNOSIS — F430 Acute stress reaction: Secondary | ICD-10-CM | POA: Diagnosis not present

## 2013-06-03 DIAGNOSIS — R599 Enlarged lymph nodes, unspecified: Secondary | ICD-10-CM | POA: Diagnosis not present

## 2013-06-03 DIAGNOSIS — Z90710 Acquired absence of both cervix and uterus: Secondary | ICD-10-CM | POA: Diagnosis not present

## 2013-06-04 DIAGNOSIS — Z79899 Other long term (current) drug therapy: Secondary | ICD-10-CM | POA: Diagnosis not present

## 2013-06-04 DIAGNOSIS — R0902 Hypoxemia: Secondary | ICD-10-CM | POA: Diagnosis not present

## 2013-06-04 DIAGNOSIS — R599 Enlarged lymph nodes, unspecified: Secondary | ICD-10-CM | POA: Diagnosis not present

## 2013-06-04 DIAGNOSIS — Z7982 Long term (current) use of aspirin: Secondary | ICD-10-CM | POA: Diagnosis not present

## 2013-06-04 DIAGNOSIS — F430 Acute stress reaction: Secondary | ICD-10-CM | POA: Diagnosis not present

## 2013-06-04 DIAGNOSIS — Q619 Cystic kidney disease, unspecified: Secondary | ICD-10-CM | POA: Diagnosis not present

## 2013-06-04 LAB — COMPREHENSIVE METABOLIC PANEL
ALK PHOS: 77 U/L
ANION GAP: 7 (ref 7–16)
AST: 21 U/L (ref 15–37)
Albumin: 3.5 g/dL (ref 3.4–5.0)
BUN: 18 mg/dL (ref 7–18)
Bilirubin,Total: 0.3 mg/dL (ref 0.2–1.0)
CHLORIDE: 103 mmol/L (ref 98–107)
CREATININE: 1.15 mg/dL (ref 0.60–1.30)
Calcium, Total: 9.6 mg/dL (ref 8.5–10.1)
Co2: 32 mmol/L (ref 21–32)
GFR CALC AF AMER: 57 — AB
GFR CALC NON AF AMER: 50 — AB
GLUCOSE: 83 mg/dL (ref 65–99)
Osmolality: 284 (ref 275–301)
POTASSIUM: 3.9 mmol/L (ref 3.5–5.1)
SGPT (ALT): 19 U/L (ref 12–78)
Sodium: 142 mmol/L (ref 136–145)
TOTAL PROTEIN: 7.6 g/dL (ref 6.4–8.2)

## 2013-06-04 LAB — CBC CANCER CENTER
BASOS ABS: 0 x10 3/mm (ref 0.0–0.1)
Basophil %: 0.8 %
EOS PCT: 2.6 %
Eosinophil #: 0.1 x10 3/mm (ref 0.0–0.7)
HCT: 38.3 % (ref 35.0–47.0)
HGB: 12.8 g/dL (ref 12.0–16.0)
LYMPHS PCT: 24.8 %
Lymphocyte #: 1.4 x10 3/mm (ref 1.0–3.6)
MCH: 32.8 pg (ref 26.0–34.0)
MCHC: 33.4 g/dL (ref 32.0–36.0)
MCV: 98 fL (ref 80–100)
MONO ABS: 0.5 x10 3/mm (ref 0.2–0.9)
MONOS PCT: 8.7 %
NEUTROS ABS: 3.5 x10 3/mm (ref 1.4–6.5)
Neutrophil %: 63.1 %
Platelet: 178 x10 3/mm (ref 150–440)
RBC: 3.9 10*6/uL (ref 3.80–5.20)
RDW: 12.8 % (ref 11.5–14.5)
WBC: 5.6 x10 3/mm (ref 3.6–11.0)

## 2013-06-14 DIAGNOSIS — I495 Sick sinus syndrome: Secondary | ICD-10-CM | POA: Diagnosis not present

## 2013-06-14 DIAGNOSIS — I119 Hypertensive heart disease without heart failure: Secondary | ICD-10-CM | POA: Diagnosis not present

## 2013-06-14 DIAGNOSIS — I059 Rheumatic mitral valve disease, unspecified: Secondary | ICD-10-CM | POA: Diagnosis not present

## 2013-06-23 ENCOUNTER — Ambulatory Visit: Payer: Self-pay | Admitting: Hematology and Oncology

## 2013-07-02 DIAGNOSIS — F331 Major depressive disorder, recurrent, moderate: Secondary | ICD-10-CM | POA: Diagnosis not present

## 2013-07-29 DIAGNOSIS — F331 Major depressive disorder, recurrent, moderate: Secondary | ICD-10-CM | POA: Diagnosis not present

## 2013-09-06 DIAGNOSIS — F331 Major depressive disorder, recurrent, moderate: Secondary | ICD-10-CM | POA: Diagnosis not present

## 2013-10-14 DIAGNOSIS — F331 Major depressive disorder, recurrent, moderate: Secondary | ICD-10-CM | POA: Diagnosis not present

## 2013-10-25 DIAGNOSIS — I10 Essential (primary) hypertension: Secondary | ICD-10-CM | POA: Diagnosis not present

## 2013-10-25 DIAGNOSIS — F3289 Other specified depressive episodes: Secondary | ICD-10-CM | POA: Diagnosis not present

## 2013-10-25 DIAGNOSIS — I38 Endocarditis, valve unspecified: Secondary | ICD-10-CM | POA: Diagnosis not present

## 2013-10-25 DIAGNOSIS — F329 Major depressive disorder, single episode, unspecified: Secondary | ICD-10-CM | POA: Diagnosis not present

## 2013-10-25 DIAGNOSIS — I059 Rheumatic mitral valve disease, unspecified: Secondary | ICD-10-CM | POA: Diagnosis not present

## 2013-11-15 DIAGNOSIS — F331 Major depressive disorder, recurrent, moderate: Secondary | ICD-10-CM | POA: Diagnosis not present

## 2014-01-05 DIAGNOSIS — F331 Major depressive disorder, recurrent, moderate: Secondary | ICD-10-CM | POA: Diagnosis not present

## 2014-01-12 ENCOUNTER — Ambulatory Visit: Payer: Self-pay | Admitting: Family Medicine

## 2014-01-12 DIAGNOSIS — Z1231 Encounter for screening mammogram for malignant neoplasm of breast: Secondary | ICD-10-CM | POA: Diagnosis not present

## 2014-01-26 ENCOUNTER — Ambulatory Visit: Payer: Self-pay | Admitting: Family Medicine

## 2014-01-26 DIAGNOSIS — R928 Other abnormal and inconclusive findings on diagnostic imaging of breast: Secondary | ICD-10-CM | POA: Diagnosis not present

## 2014-01-26 DIAGNOSIS — R922 Inconclusive mammogram: Secondary | ICD-10-CM | POA: Diagnosis not present

## 2014-02-04 DIAGNOSIS — F331 Major depressive disorder, recurrent, moderate: Secondary | ICD-10-CM | POA: Diagnosis not present

## 2014-03-23 DIAGNOSIS — F331 Major depressive disorder, recurrent, moderate: Secondary | ICD-10-CM | POA: Diagnosis not present

## 2014-04-13 DIAGNOSIS — Z23 Encounter for immunization: Secondary | ICD-10-CM | POA: Diagnosis not present

## 2014-04-13 DIAGNOSIS — Z0000 Encounter for general adult medical examination without abnormal findings: Secondary | ICD-10-CM | POA: Diagnosis not present

## 2014-04-13 DIAGNOSIS — Z1389 Encounter for screening for other disorder: Secondary | ICD-10-CM | POA: Diagnosis not present

## 2014-04-14 DIAGNOSIS — E039 Hypothyroidism, unspecified: Secondary | ICD-10-CM | POA: Diagnosis not present

## 2014-04-14 DIAGNOSIS — R7309 Other abnormal glucose: Secondary | ICD-10-CM | POA: Diagnosis not present

## 2014-04-14 DIAGNOSIS — F419 Anxiety disorder, unspecified: Secondary | ICD-10-CM | POA: Diagnosis not present

## 2014-04-27 DIAGNOSIS — F331 Major depressive disorder, recurrent, moderate: Secondary | ICD-10-CM | POA: Diagnosis not present

## 2014-05-12 ENCOUNTER — Ambulatory Visit: Payer: Self-pay | Admitting: Family Medicine

## 2014-05-12 DIAGNOSIS — M8580 Other specified disorders of bone density and structure, unspecified site: Secondary | ICD-10-CM | POA: Diagnosis not present

## 2014-05-12 DIAGNOSIS — Z13820 Encounter for screening for osteoporosis: Secondary | ICD-10-CM | POA: Diagnosis not present

## 2014-05-12 DIAGNOSIS — M899 Disorder of bone, unspecified: Secondary | ICD-10-CM | POA: Diagnosis not present

## 2014-05-20 ENCOUNTER — Ambulatory Visit: Payer: Self-pay | Admitting: Hematology and Oncology

## 2014-06-01 DIAGNOSIS — F331 Major depressive disorder, recurrent, moderate: Secondary | ICD-10-CM | POA: Diagnosis not present

## 2014-07-01 DIAGNOSIS — F331 Major depressive disorder, recurrent, moderate: Secondary | ICD-10-CM | POA: Diagnosis not present

## 2014-07-01 DIAGNOSIS — E039 Hypothyroidism, unspecified: Secondary | ICD-10-CM | POA: Diagnosis not present

## 2014-07-11 ENCOUNTER — Ambulatory Visit
Admit: 2014-07-11 | Disposition: A | Discharge status: Home / Self Care | Payer: Self-pay | Attending: Hematology and Oncology | Admitting: Hematology and Oncology

## 2014-07-11 DIAGNOSIS — F329 Major depressive disorder, single episode, unspecified: Secondary | ICD-10-CM | POA: Diagnosis not present

## 2014-07-11 DIAGNOSIS — Z79899 Other long term (current) drug therapy: Secondary | ICD-10-CM | POA: Diagnosis not present

## 2014-07-11 DIAGNOSIS — I517 Cardiomegaly: Secondary | ICD-10-CM | POA: Diagnosis not present

## 2014-07-11 DIAGNOSIS — I10 Essential (primary) hypertension: Secondary | ICD-10-CM | POA: Diagnosis not present

## 2014-07-11 DIAGNOSIS — I2510 Atherosclerotic heart disease of native coronary artery without angina pectoris: Secondary | ICD-10-CM | POA: Diagnosis not present

## 2014-07-11 DIAGNOSIS — R634 Abnormal weight loss: Secondary | ICD-10-CM | POA: Diagnosis not present

## 2014-07-11 DIAGNOSIS — Z7982 Long term (current) use of aspirin: Secondary | ICD-10-CM | POA: Diagnosis not present

## 2014-07-11 DIAGNOSIS — R590 Localized enlarged lymph nodes: Secondary | ICD-10-CM | POA: Diagnosis not present

## 2014-07-11 DIAGNOSIS — E039 Hypothyroidism, unspecified: Secondary | ICD-10-CM | POA: Diagnosis not present

## 2014-07-11 DIAGNOSIS — K8020 Calculus of gallbladder without cholecystitis without obstruction: Secondary | ICD-10-CM | POA: Diagnosis not present

## 2014-07-11 LAB — COMPREHENSIVE METABOLIC PANEL
Albumin: 3.9 g/dL
Alkaline Phosphatase: 92 U/L
Anion Gap: 5 — ABNORMAL LOW (ref 7–16)
BUN: 14 mg/dL
Bilirubin,Total: 0.6 mg/dL
Calcium, Total: 9.3 mg/dL
Chloride: 102 mmol/L
Co2: 31 mmol/L
Creatinine: 0.93 mg/dL
EGFR (African American): >60
EGFR (Non-African Amer.): >60
Glucose: 92 mg/dL
Potassium: 4 mmol/L
SGOT(AST): 24 U/L
SGPT (ALT): 15 U/L
Sodium: 138 mmol/L
Total Protein: 7.6 g/dL

## 2014-07-11 LAB — CBC CANCER CENTER
Basophil #: 0 x10 3/mm (ref 0.0–0.1)
Basophil %: 0.7 %
Eosinophil #: 0.2 x10 3/mm (ref 0.0–0.7)
Eosinophil %: 3.1 %
HCT: 37.1 % (ref 35.0–47.0)
HGB: 12.6 g/dL (ref 12.0–16.0)
Lymphocyte #: 1.4 x10 3/mm (ref 1.0–3.6)
Lymphocyte %: 28 %
MCH: 33.2 pg (ref 26.0–34.0)
MCHC: 34.1 g/dL (ref 32.0–36.0)
MCV: 97 fL (ref 80–100)
Monocyte #: 0.5 x10 3/mm (ref 0.2–0.9)
Monocyte %: 10.2 %
Neutrophil #: 2.9 x10 3/mm (ref 1.4–6.5)
Neutrophil %: 58 %
Platelet: 214 x10 3/mm (ref 150–440)
RBC: 3.81 10*6/uL (ref 3.80–5.20)
RDW: 12.4 % (ref 11.5–14.5)
WBC: 5 x10 3/mm (ref 3.6–11.0)

## 2014-07-11 LAB — URIC ACID: Uric Acid: 5.5 mg/dL

## 2014-07-11 LAB — LACTATE DEHYDROGENASE: LDH: 136 U/L

## 2014-07-13 LAB — PROT IMMUNOELECTROPHORES(ARMC)

## 2014-07-15 NOTE — Consult Note (Signed)
PATIENT NAME:  Wanda, Ochoa MR#:  [id number] DATE OF BIRTH:  04/07/1945  DATE OF CONSULTATION:  09/14/2012  CONSULTING PHYSICIAN:  Idalia Needle. Chauncy Passy, MD  CHIEF COMPLAINT: "Those damn kids of mine."  HISTORY OF PRESENT ILLNESS:  Wanda Ochoa reports that Wanda Ochoa kids do not pay any attention to Wanda Ochoa and do not believe anything she says.  She goes on to say that they only come to see Wanda Ochoa when they want something.  She reports that she has had many medical problems and that she feels like she is not valued by Wanda Ochoa family.  She is tearful at times.    She talks about Wanda Ochoa daughter's Cushing's disease and Wanda Ochoa surgery most recently for a pituitary tumor.  She says that she is unable to sleep.  She reports that she is generally agitated and irritable.  She reports that she has begun seeing Dr. Nicolasa Ducking, and they are working on Wanda Ochoa medications currently.  She also reports Wanda Ochoa daughter  is bipolar and that she did attempt suicide about 2 years ago.  She goes on to say that she, on the other hand, is not suicidal although she says that to get the attention of Wanda Ochoa children.  She is labile on interview.    PRECIPITATING EVENT AND RECENT STRESSORS:  Wanda Ochoa reports that she is stressed by the way Wanda Ochoa children treat Wanda Ochoa.  She also goes on to say that Wanda Ochoa son is currently in college and is now home from school for 2 months.    Wanda Ochoa reports some unrelenting sleep deprivation.  She also reports suicidality and has a plan to overdose if she does not get some help.  Wanda Ochoa judgment appears to be impaired.  She had some impulsivity.  She describes the light above Wanda Ochoa bed in the room moving up and down the wall and the TV jumping around the room.  She also reports that Wanda Ochoa thoughts are flying and that it is hard for Wanda Ochoa to concentrate, although she then goes on to say, "When you walked in the room I just stopped thinking and focused on you."  Wanda Ochoa behavior is within normal limits.  She does not appear impulsive,  agitated, aggressive, disinhibited or apathetic.  She is awake and alert.  She attends to the conversation.  She reports that she does not feel like living because she no longer believes in God.  She cannot go on to explain that in detail but does allow that she is not a very religious person, and she does not go to services or church, nor does she read Wanda Ochoa Bible.    PAST PSYCHIATRIC TREATMENT:  She reports that she feels like she needs to be in the hospital but has never been.  She has no history of suicide attempts.  She is obtaining current outpatient treatment from Dr. Nicolasa Ducking.  CURRENT MEDICATIONS: She is compliant with Wanda Ochoa medicines. 1.  Xanax 0.5 mg 1 p.o. p.r.n. daily. 2.  Pamelor 75 mg 1 p.o. at bedtime. 3.  Zoloft 50 mg tablet, 1 p.o. daily. 4.  Lisinopril/hydrochlorothiazide 20 to 25 mg tabs, take 1 p.o. daily. 5.  Toprol XL 50 mg tab, take 1 twice daily. 6.  Lipoflavovit caplet, take 1 b.i.d. as needed.   SUBSTANCE ABUSE:  She denies any substance abuse.    PAST MEDICAL/SURGICAL HISTORY:  1.  Hypertension. 2.  Hypothyroidism.  3.  History of hysterectomy. 4.  History of biopsy of the right breast, benign. 5.  Enlarged lymph  nodes currently being followed by the [facility].   MENTAL ILLNESS IN THE FAMILY:  Wanda Ochoa endorses past mental hospitalizations in Wanda Ochoa daughter.    PREVIOUS PHYSICAL OR SEXUAL ABUSE: She denies.   LEGAL HISTORY:  She denies.   MENTAL STATUS EXAM: Wanda Ochoa speech is monotone and somewhat soft.  Wanda Ochoa mood is euthymic, somewhat blunted.  Wanda Ochoa affect is slightly depressed.  Wanda Ochoa thought processes are linear, logical and goal-directed except when questioned about odd occurrences, and she talks about the TV and the lights.  Thought content:  She endorses suicidal ideation with a plan to overdose.  She denies homicidal ideation.  She endorses visual hallucinations, no auditory hallucinations.  There are no delusions, depersonalizations, derealizations, illusions,  ideas of reference or command auditory hallucinations.  She is oriented x4.  Wanda Ochoa attention is alert and awake.  Wanda Ochoa concentration is good-to-fair.  Wanda Ochoa memory appears to be intact.  Wanda Ochoa fund of knowledge is fair.  Wanda Ochoa language is good-to-fair.  Wanda Ochoa judgment is poor.  Wanda Ochoa insight is poor, and Wanda Ochoa reliability, I believe, is poor.    SUICIDE RISK LEVEL:  Some current risk.    REVIEW OF SYSTEMS: Noncontributory with the exception of decreased appetite.   PRINCIPAL AND PRIMARY: AXIS I:  Major depressive disorder, recurrent, moderate with a history of psychotic features.   AXIS II:  Deferred.  AXIS III:   Hypertension and hypothyroidism.  AXIS IV:   Moderate-to-severe:  A daughter with mental illness, a son   with bipolar disorder, and Wanda Ochoa father committed suicide.   AXIS V:   Wanda Ochoa Global Assessment of Functioning score at present equals 50.   TREATMENT PLAN:  1.  We will continue Wanda Ochoa medication regiment. 2.  She has agreed to a transfer to the psychiatric floor and suicidal precautions.    ____________________________ Idalia Needle Chauncy Passy, MD fcg:cb D: 09/14/2012 16:24:09 ET T: 09/14/2012 17:25:32 ET JOB#: [id number]  cc: Idalia Needle. Chauncy Passy, MD, <Dictator> Ladoris Gene MD ELECTRONICALLY SIGNED 09/24/2012 12:53

## 2014-07-15 NOTE — H&P (Signed)
PATIENT NAME:  Wanda Ochoa, Wanda Ochoa MR#:  [id number] DATE OF BIRTH:  [birth year]  DATE OF ADMISSION:  09/14/2012  IDENTIFYING INFORMATION AND REASON FOR CONSULT:  A 69 year old woman who came to the Emergency Room this morning with a chief complaint "I don't believe in God anymore, I want to kill myself."   HISTORY OF PRESENT ILLNESS:  Information obtained from the patient, from the chart and also from talking with the patient's husband. The patient is not a very good historian currently because of her mental state. The patient reports to me that she woke up this morning suddenly aware that she no longer believed in God and wanted to kill herself. She cannot give a clear explanation for her thought process around this. She says that she has been feeling bad for a long time, although she has a hard time being clear about the time course of events. She says she has been sleeping very very poorly recently and also that her appetite has been very bad. She admits that she has lost a great deal of weight. She has been feeling anxious and sad, although she has a difficult time articulating more specifics about her emotions. She has been under a great deal of stress because of illnesses suffered by her children. The patient had been prescribed antidepressants by an outpatient psychiatrist. She had recently changed psychiatrists because of a change in her living situation. It is not entirely clear what antidepressant she is currently taking. She claims that she has been compliant with her medical medications, although she tells me that she will no longer take any of her medicines in the hospital because she intends to die. The patient told me that she was having auditory hallucinations, although she declined to or could not describe them in any detail.   Information from the patient's husband largely corroborates this. He does however report that she has been depressed for decades, although she apparently has managed to  function for most of that. For the last few months, she appears to have been on more of a decline with a significant loss of weight, poor sleep, poor functioning, worsening mood, but the husband reports that her current mental state the way she is today is brand new as of this morning. He says he had never heard her make any statements about intending to kill herself or about not believing in God in the past. He had not been aware of any psychotic symptoms. He says that typically she is extremely compliant with all of her medications and medical recommendations.   PAST PSYCHIATRIC HISTORY:  No previous psychiatric hospitalizations. No known suicide attempts. No history of substance abuse. The patient was apparently diagnosed with what the husband calls "chemical depression" years ago and was treated with antidepressants. It appears that she may have been on nortriptyline for years subsequently, although the dose is unclear. More recently, she has apparently been on Zoloft, although it is not clear whether she was actually taking it consistently.   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 She is married and has adult children. Her children have suffered from multiple illnesses over the years including suicide attempts and psychiatric hospitalizations as well as a pituitary tumor that caused extended severe symptoms for one of her daughters. The patient is currently not able to work.   PAST MEDICAL HISTORY:  I note that a few  months ago she had a CT scan of her abdomen which found multiple nodules in the retroperitoneal space. She was however largely asymptomatic at the time and no biopsy was performed. No clear diagnosis. She has a history of hypertension and of hypothyroidism.   SUBSTANCE ABUSE HISTORY:  No history of substance abuse problems.   FAMILY HISTORY:  Children who  have been diagnosed with bipolar disorder and severe depression.   CURRENT MEDICATIONS:  The complete list is not completely clear to me, but appears to probably include Synthroid 75 mcg a day, Toprol-XL 50 mg twice a day, hydrochlorothiazide/lisinopril combination 25/20 mg once a day, ProAir albuterol 2 puffs as needed, Xanax 0.5 mg twice a day as needed for anxiety, enteric-coated aspirin 81 mg a day, Dulcolax 10 mg once a day (apparently that was recently started because of constipation which may be due to her dehydration and lack of p.o. intake).   ALLERGIES:  PENICILLIN.   REVIEW OF SYSTEMS:  The patient is not a very good historian. She does complain of auditory hallucinations, depressed mood and a wish to die. She is hard pressed to articulate more about this. Says that she has not been sleeping well and not been eating well. She also has been feeling dizzy when she stands up and has had a difficult time walking probably due to orthostasis from starvation.   MENTAL STATUS EXAMINATION:  The patient was passively cooperative. She initially would not stand up and leave her room, but was cooperative with interview within her hospital room. She made fairly good eye contact, but her psychomotor activity was quite subdued. Speech was decreased in total amount and was halting with what appears to be thought blocking at times. Thoughts are disorganized. She reports that her mood is bad and that she wants to die, although she cannot explain it any further than that. Her affect appears to be blank, not tearful. The patient is alert and oriented x 4. She is able to follow simple commands. She is able to tell me her address. When asked about more emotionally relevant issues like about her family, she becomes disorganized and gives inconsistent answers.   PHYSICAL EXAMINATION:  The patient looks very thin and appears to have probably lost a significant amount of weight. Her current weight is 141 pounds or 64 kg  compared to about 74 kg just a few months ago documented. Hair looks like it may be a little bit sparse. No acute skin lesions. Pupils equal and reactive. Face symmetric. Oral mucosa normal, but a bit dry. Full range of motion at all extremities. Normal gait. Neck and back nontender. Strength and reflexes symmetric and normal throughout. Cranial nerves symmetric and normal. Lungs are clear without wheezes. Heart is regular rate and rhythm. Abdomen is soft, nontender, normal bowel sounds. Most recent temperature 98.2, respirations 20, pulse 135, blood pressure 164/88.   LABORATORY RESULTS:  Drug screen positive for tricyclics and benzodiazepines which would be consistent with her recently prescribed medication if in fact she is taking Pamelor. TSH is normal at 2.47. Alcohol undetected. On chemistry panel, slightly elevated glucose at 101, potassium low at 3.4, total protein slightly elevated at 8.3. CBC is normal. Urinalysis is suspicious for likely urinary tract infection. Acetaminophen and salicylates undetected. I had a CT scan done this evening of her head which is entirely normal.   ASSESSMENT:  A 69 year old woman with a presentation which sounds like a progressively worsening depression currently with psychotic features. Further differential diagnosis would  include medical causes particularly in light of her weight loss and the abnormal lymph nodes. Substance induced less likely. The patient clearly needs hospitalization because of severe impairment and statements of suicidal ideation as well as starvation and statements that she will refuse medicine and food.   TREATMENT PLAN:  I have talked with her husband about this. I have talked with the patient and done psychoeducation about the basics of needing to eat and take fluid and be cooperative with medication. We reviewed her medical needs. The patient acknowledges all this, but still states that she will be noncompliant. At this point, I have suggested  to nursing that the patient would probably do better to be in a room closer to the nursing station because of a fall risk as well as unpredictable behavior. Continue suicide precautions. I am going to try starting Seroquel tonight to see if we can get her some sleep and also get an EKG. I have ordered an oncology consult as a followup for the lymph nodes noted a few months ago. The patient's p.o. intake will be monitored. If she is persistent in refusing to take food or drink, we may need to force nutrition or at least IV fluids sooner than later. The patient could be a potential ECT candidate, although at this point, I am holding off discussing that given the acuity of the situation.   DIAGNOSIS, PRINCIPAL AND PRIMARY:  AXIS I: Major depression, severe, recurrent, psychotic.   SECONDARY DIAGNOSES: AXIS I: No further.  AXIS II: No diagnosis.  AXIS III: Weight loss of unclear etiology, history of lymph nodes in the abdomen of unclear etiology, hypertension, chronic obstructive pulmonary disease and hypothyroidism.  AXIS IV: Severe from multiple family stresses.  AXIS V: Functioning at time of evaluation 15.    ____________________________ Gonzella Lex, MD jtc:si D: 09/14/2012 23:36:00 ET T: 09/15/2012 00:06:03 ET JOB#: [id number]  cc: Gonzella Lex, MD, <Dictator> Gonzella Lex MD ELECTRONICALLY SIGNED 09/15/2012 18:49

## 2014-07-15 NOTE — Consult Note (Signed)
Brief Consult Note: Diagnosis: Major Depression.   Discussed with Attending MD.   Comments: patient with retroperitoneal LN stable on CT scan in 10/13 and 3/14. Weight loss and current symptoms likely related to psychosis/depression. Will evaluate with repeat CT scan and consider LN biopsy if increase in size of LN when patient stable from acute psychiatric issues.  Electronic Signatures: Georges Mouse (MD)  (Signed 24-Jun-14 13:58)  Authored: Brief Consult Note   Last Updated: 24-Jun-14 13:58 by Georges Mouse (MD)

## 2014-07-15 NOTE — Discharge Summary (Signed)
PATIENT NAME:  Wanda Ochoa, Wanda Ochoa MR#:  [id number] DATE OF BIRTH:  [birth year]  DATE OF ADMISSION:  09/14/2012 DATE OF DISCHARGE:  09/23/2012  HOSPITAL COURSE: See dictated history and physical for details of admission. This 69 year old woman with a history of depression was admitted to the hospital with an acute severe decompensation resulting in psychotic symptoms, severe depression and suicidal ideation. In the hospital, she rapidly decompensated even further to a state of catatonia. For over a day, she was not eating, not drinking, not taking medication and did not communicate verbally. She barely moved for at least a day. We actually monitored her chemistries, and I did start her on some IV fluid prior to initiating ECT to prevent any renal impairment. The patient and the husband were counseled that under these circumstances ECT was the most appropriate treatment. The patient was not taking any medication. Husband and patient were both knowledgeable about ECT, and the husband was agreeable. Education was done, and the patient's husband signed conMISSING sent. The patient's husband did have a Hospital doctor as well. She started getting ECT treatments on Friday, the 27th of June. As of the time of discharge, she has had 3 right unilateral ECT treatments. She has tolerated all of them well without any evident difficulty or side effects. The patient has made a tremendous recovery.  After only 1 treatment, she woke up and started eating and drinking and taking medication, although initially she was still somewhat confused. After her 2nd treatment, she has returned nearly to baseline. Affect is bright and upbeat. The patient does not remember any of the psychotic symptoms or suicidal ideation. She is eating and drinking well, taking medication, shows good insight and is able to discuss important valuable things in her life that she is wanting to get back to. Totally denies suicidal ideation. She has now  been able to attend some groups appropriately. She already has outpatient treatment arranged in the community with Dr. Nicolasa Ducking. She had her 3rd ECT treatment today which was once again tolerated well. I discussed her situation with the patient and her husband today, and they agree that as far as they can tell she is nearly back to baseline. Because of this, we will discharge her from the hospital and (Dictation Anomaly) <<halt>> 3 times a week ECT treatment. She is going to continue taking Remeron and Risperdal, nortriptyline, Zoloft and Seroquel at this point. This does duplicate some of her medications. She may not need all of this as an outpatient. She is tolerating it well, and she will be seeing Dr. Nicolasa Ducking next week who can follow up with her and decide about appropriate medicine regimen.   DISCHARGE MEDICATIONS: Hydrochlorothiazide 25 mg q. a.m., Synthroid 75 mcg q. a.m., Zestril 20 mg per day, metoprolol ER 50 mg twice a day, Remeron 30 mg at night, multivitamin once a day, Pamelor 25 mg at night, fish oil 1 gram per day, quetiapine 50 mg at night, Risperdal 1 mg twice a day, Zoloft 25 mg a day, aspirin 81 mg per day.   LABORATORY AND RADIOLOGICAL RESULTS: Admission labs were positive for benzodiazepines and for tricyclic antidepressants. TSH normal at 2.47. Alcohol not detected. Chemistry showed slightly low potassium at 3.4, otherwise normal kidney function and normal electrolytes. CBC unremarkable. Urinalysis not likely to be acutely infected.  EKG unremarkable. Chest x-ray unremarkable.   MENTAL STATUS EXAM AT DISCHARGE: Neatly dressed and groomed woman, looks her stated age, cooperative with the interview. Good eye contact,  normal psychomotor activity. Speech normal rate, tone and volume. Affect smiling, reactive, appropriate. Mood stated as good. Thoughts appear to be lucid without loosening of associations or delusions. Denies auditory or visual hallucinations. Denies suicidal or homicidal  ideation. Shows improved judgment and insight. Normal intelligence. Alert and oriented x 4.   DISPOSITION: As noted above. Follow up with ECT next Wednesday and with Dr. Nicolasa Ducking in the interval.   DIAGNOSIS, PRINCIPAL AND PRIMARY:  AXIS I: Major depression, severe, recurrent psychotic features.   SECONDARY DIAGNOSES: AXIS I: No further.   AXIS II: Deferred.   AXIS III:  1.  Hypertension,  2.  Hypothyroidism.   AXIS IV: Severe from multiple stresses including illnesses her children suffered.         AXIS V: Functioning at time of discharge 60.  ____________________________ Gonzella Lex, MD jtc:cb D: 09/23/2012 15:17:41 ET T: 09/23/2012 17:56:06 ET JOB#: [id number]  cc: Gonzella Lex, MD, <Dictator> Gonzella Lex MD ELECTRONICALLY SIGNED 09/24/2012 10:47

## 2014-07-28 DIAGNOSIS — F331 Major depressive disorder, recurrent, moderate: Secondary | ICD-10-CM | POA: Diagnosis not present

## 2014-09-21 DIAGNOSIS — F331 Major depressive disorder, recurrent, moderate: Secondary | ICD-10-CM | POA: Diagnosis not present

## 2014-11-02 DIAGNOSIS — F331 Major depressive disorder, recurrent, moderate: Secondary | ICD-10-CM | POA: Diagnosis not present

## 2014-11-24 ENCOUNTER — Other Ambulatory Visit: Payer: Self-pay | Admitting: Family Medicine

## 2014-11-24 DIAGNOSIS — E039 Hypothyroidism, unspecified: Secondary | ICD-10-CM | POA: Insufficient documentation

## 2014-11-24 NOTE — Telephone Encounter (Signed)
Per [employer] she is taking 125mcg of Levothyroxine.  I left a message for the pt to call back and verify what dose she has.   Thanks,   -Mickel Baas

## 2014-11-30 MED ORDER — LEVOTHYROXINE SODIUM 112 MCG PO TABS: 112.0000 ug | ORAL_TABLET | Freq: Every day | ORAL | Status: DC

## 2014-11-30 NOTE — Telephone Encounter (Signed)
Patient called back and said the Levothyroxine is 112 mcg. Also patient said that she will be around here because is coming to the hospital and wants to know if her Cholesterol can be check. Per patient in the last labs the cholesterol was no checked.  Please advise.  Thanks,  -Benjamin Casanas

## 2014-12-02 ENCOUNTER — Telehealth: Payer: Self-pay | Admitting: Family Medicine

## 2014-12-02 DIAGNOSIS — E039 Hypothyroidism, unspecified: Secondary | ICD-10-CM

## 2014-12-02 NOTE — Telephone Encounter (Signed)
Pt is requesting lab slip to have her cholesterol checked.  She requested it on 9/7 when she requested her refill  Thanks, Con Memos;

## 2014-12-02 NOTE — Telephone Encounter (Signed)
OK to print out lab slip.

## 2014-12-02 NOTE — Telephone Encounter (Signed)
Printed lab slip and pt advised. sd

## 2014-12-05 DIAGNOSIS — F331 Major depressive disorder, recurrent, moderate: Secondary | ICD-10-CM | POA: Diagnosis not present

## 2014-12-06 DIAGNOSIS — E039 Hypothyroidism, unspecified: Secondary | ICD-10-CM | POA: Diagnosis not present

## 2014-12-07 LAB — LIPID PANEL WITH LDL/HDL RATIO
CHOLESTEROL TOTAL: 218 mg/dL — AB (ref 100–199)
HDL: 57 mg/dL (ref >39)
LDL Calculated: 130 mg/dL — ABNORMAL HIGH (ref 0–99)
LDL/HDL RATIO: 2.3 ratio (ref 0.0–3.2)
TRIGLYCERIDES: 153 mg/dL — AB (ref 0–149)
VLDL Cholesterol Cal: 31 mg/dL (ref 5–40)

## 2014-12-07 LAB — TSH: TSH: 1.3 u[IU]/mL (ref 0.450–4.500)

## 2014-12-09 ENCOUNTER — Telehealth: Payer: Self-pay

## 2014-12-09 NOTE — Telephone Encounter (Signed)
Pt advised; she would like to continue working on lifestyle changes first.   Thanks,   -Mickel Baas

## 2014-12-09 NOTE — Telephone Encounter (Signed)
-----   Message from Margarita Rana, MD sent at 12/08/2014  8:05 PM EDT ----- Labs stable. Cholesterol is 218 but with high good cholesterol. 10 year risk of heart disease is about 9.8 percent.  Can continue lifestyle changes or consider medication if patient would prefer. Thanks.

## 2014-12-09 NOTE — Telephone Encounter (Signed)
Pt is returning call.  HB#[phone]/BO

## 2014-12-09 NOTE — Telephone Encounter (Signed)
Left message to call back  

## 2014-12-09 NOTE — Telephone Encounter (Signed)
LMTCB 12/09/2014  Thanks,   -Mickel Baas

## 2014-12-14 ENCOUNTER — Other Ambulatory Visit: Payer: Self-pay | Admitting: Family Medicine

## 2014-12-14 DIAGNOSIS — J45909 Unspecified asthma, uncomplicated: Secondary | ICD-10-CM

## 2014-12-14 MED ORDER — ALBUTEROL SULFATE HFA 108 (90 BASE) MCG/ACT IN AERS: 2.0000 | INHALATION_SPRAY | Freq: Four times a day (QID) | RESPIRATORY_TRACT | Status: DC | PRN

## 2014-12-14 NOTE — Telephone Encounter (Signed)
Last OV 03/2014  Thanks,   -Laura  

## 2014-12-14 NOTE — Telephone Encounter (Signed)
Pt contacted office for refill request on the following medications:   albuterol (PROAIR HFA) 108 (90 BASE) MCG/ACT inhaler.  [place]

## 2014-12-22 DIAGNOSIS — M654 Radial styloid tenosynovitis [de Quervain]: Secondary | ICD-10-CM | POA: Diagnosis not present

## 2014-12-24 DIAGNOSIS — S39012A Strain of muscle, fascia and tendon of lower back, initial encounter: Secondary | ICD-10-CM | POA: Insufficient documentation

## 2014-12-24 DIAGNOSIS — R0902 Hypoxemia: Secondary | ICD-10-CM | POA: Insufficient documentation

## 2014-12-24 DIAGNOSIS — F419 Anxiety disorder, unspecified: Secondary | ICD-10-CM | POA: Insufficient documentation

## 2014-12-24 DIAGNOSIS — M8580 Other specified disorders of bone density and structure, unspecified site: Secondary | ICD-10-CM | POA: Insufficient documentation

## 2014-12-24 DIAGNOSIS — E871 Hypo-osmolality and hyponatremia: Secondary | ICD-10-CM | POA: Insufficient documentation

## 2014-12-24 DIAGNOSIS — J309 Allergic rhinitis, unspecified: Secondary | ICD-10-CM | POA: Insufficient documentation

## 2015-01-11 DIAGNOSIS — F331 Major depressive disorder, recurrent, moderate: Secondary | ICD-10-CM | POA: Diagnosis not present

## 2015-01-29 ENCOUNTER — Other Ambulatory Visit: Payer: Self-pay | Admitting: Family Medicine

## 2015-01-29 DIAGNOSIS — I10 Essential (primary) hypertension: Secondary | ICD-10-CM

## 2015-02-21 ENCOUNTER — Ambulatory Visit (INDEPENDENT_AMBULATORY_CARE_PROVIDER_SITE_OTHER): Payer: Medicare Other | Admitting: Family Medicine

## 2015-02-21 ENCOUNTER — Encounter: Payer: Self-pay | Admitting: Family Medicine

## 2015-02-21 VITALS — BP 150/74 | HR 76 | Temp 98.5°F | Resp 16 | Wt 192.0 lb

## 2015-02-21 DIAGNOSIS — J3089 Other allergic rhinitis: Secondary | ICD-10-CM

## 2015-02-21 DIAGNOSIS — J069 Acute upper respiratory infection, unspecified: Secondary | ICD-10-CM | POA: Diagnosis not present

## 2015-02-21 DIAGNOSIS — J45909 Unspecified asthma, uncomplicated: Secondary | ICD-10-CM | POA: Diagnosis not present

## 2015-02-21 DIAGNOSIS — I341 Nonrheumatic mitral (valve) prolapse: Secondary | ICD-10-CM | POA: Insufficient documentation

## 2015-02-21 DIAGNOSIS — R059 Cough, unspecified: Secondary | ICD-10-CM

## 2015-02-21 DIAGNOSIS — R05 Cough: Secondary | ICD-10-CM | POA: Diagnosis not present

## 2015-02-21 MED ORDER — GUAIFENESIN-CODEINE 100-10 MG/5ML PO SOLN
5.0000 mL | ORAL | Status: DC | PRN
Start: 2015-02-21 — End: 2015-05-02
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
## 2015-03-13 DIAGNOSIS — F331 Major depressive disorder, recurrent, moderate: Secondary | ICD-10-CM | POA: Diagnosis not present

## 2015-04-10 ENCOUNTER — Other Ambulatory Visit: Payer: Self-pay | Admitting: Family Medicine

## 2015-04-10 DIAGNOSIS — Z1231 Encounter for screening mammogram for malignant neoplasm of breast: Secondary | ICD-10-CM

## 2015-04-18 ENCOUNTER — Encounter: Payer: Self-pay | Admitting: Family Medicine

## 2015-04-25 ENCOUNTER — Ambulatory Visit
Admission: RE | Admit: 2015-04-25 | Discharge: 2015-04-25 | Disposition: A | Discharge status: Home / Self Care | Payer: Medicare Other | Source: Ambulatory Visit | Attending: Family Medicine | Admitting: Family Medicine

## 2015-04-25 ENCOUNTER — Other Ambulatory Visit: Payer: Self-pay | Admitting: Family Medicine

## 2015-04-25 DIAGNOSIS — Z1231 Encounter for screening mammogram for malignant neoplasm of breast: Secondary | ICD-10-CM | POA: Diagnosis present

## 2015-04-26 DIAGNOSIS — F331 Major depressive disorder, recurrent, moderate: Secondary | ICD-10-CM | POA: Diagnosis not present

## 2015-05-02 ENCOUNTER — Encounter: Payer: Self-pay | Admitting: Family Medicine

## 2015-05-02 ENCOUNTER — Ambulatory Visit (INDEPENDENT_AMBULATORY_CARE_PROVIDER_SITE_OTHER): Payer: Medicare Other | Admitting: Family Medicine

## 2015-05-02 VITALS — BP 128/76 | HR 86 | Temp 98.1°F | Resp 16 | Ht 69.0 in | Wt 188.0 lb

## 2015-05-02 DIAGNOSIS — F331 Major depressive disorder, recurrent, moderate: Secondary | ICD-10-CM | POA: Diagnosis not present

## 2015-05-02 DIAGNOSIS — R319 Hematuria, unspecified: Secondary | ICD-10-CM | POA: Diagnosis not present

## 2015-05-02 DIAGNOSIS — Z0000 Encounter for general adult medical examination without abnormal findings: Secondary | ICD-10-CM | POA: Diagnosis not present

## 2015-05-02 DIAGNOSIS — E7800 Pure hypercholesterolemia, unspecified: Secondary | ICD-10-CM | POA: Diagnosis not present

## 2015-05-02 LAB — POCT URINALYSIS DIPSTICK
Bilirubin, UA: NEGATIVE
GLUCOSE UA: NEGATIVE
Ketones, UA: NEGATIVE
Leukocytes, UA: NEGATIVE
NITRITE UA: NEGATIVE
PROTEIN UA: NEGATIVE
Spec Grav, UA: <=1.005
UROBILINOGEN UA: 0.2
pH, UA: 7

## 2015-05-02 NOTE — Progress Notes (Signed)
Patient ID: [name], female   DOB: 1946-02-02, 71 y.o.   MRN: [id number]       Patient: Wanda Ochoa, Female    DOB: 1945/06/06, 70 y.o.   MRN: [id number] Visit Date: 05/02/2015  Today's Provider: Margarita Rana, MD   Chief Complaint  Patient presents with  . Medicare Wellness   Subjective:    Annual wellness visit Wanda Ochoa is a 70 y.o. female. She feels well. She reports exercising daily 3-4 miles. She reports she is sleeping fairly well.  Has done will with anxiety. Recently followed up with Dr. Nicolasa Ducking when she realized she was having increased anxiety.    04/13/14 CPE 04/25/15 Mammo-BI-RADS 1  04/09/13 Colon-redundant colon recheck in 5 yrs 05/12/14 BMD-WNL, recheck in 3 yrs  Lab Results  Component Value Date   WBC 5.0 07/11/2014   HGB 12.6 07/11/2014   HCT 37.1 07/11/2014   PLT 214 07/11/2014   GLUCOSE 92 07/11/2014   CHOL 218* 12/06/2014   TRIG 153* 12/06/2014   HDL 57 12/06/2014   LDLCALC 130* 12/06/2014   ALT 15 07/11/2014   AST 24 07/11/2014   NA 138 07/11/2014   K 4.0 07/11/2014   CL 102 07/11/2014   CREATININE 0.93 07/11/2014   BUN 14 07/11/2014   CO2 31 07/11/2014   TSH 1.300 12/06/2014    -----------------------------------------------------------   Review of Systems  Constitutional: Negative.   HENT: Positive for tinnitus.   Eyes: Negative.   Respiratory: Negative.   Cardiovascular: Negative.   Gastrointestinal: Negative.   Endocrine: Negative.   Genitourinary: Negative.   Musculoskeletal: Negative.   Skin: Negative.   Allergic/Immunologic: Negative.   Neurological: Negative.   Hematological: Negative.   Psychiatric/Behavioral: The patient is nervous/anxious.     Social History   Social History  . Marital Status: Married    Spouse Name: N/A  . Number of Children: 3  . Years of Education: College   Occupational History  . [employer] Practitioner     Part-time   Social History Main Topics  . Smoking  status: Never Smoker   . Smokeless tobacco: Never Used  . Alcohol Use: No  . Drug Use: No  . Sexual Activity: Not on file   Other Topics Concern  . Not on file   Social History Narrative    Past Medical History  Diagnosis Date  . Thyroid disease   . Asthma   . HTN (hypertension)   . Anxiety      Patient Active Problem List   Diagnosis Date Noted  . Billowing mitral valve 02/21/2015  . Heart valve disease 02/21/2015  . Upper respiratory infection 02/21/2015  . Allergic rhinitis 12/24/2014  . Anxiety 12/24/2014  . Below normal amount of sodium in the blood 12/24/2014  . Hypoxia 12/24/2014  . Osteopenia 12/24/2014  . Back strain 12/24/2014  . Airway hyperreactivity 12/14/2014  . Hypothyroidism 11/24/2014  . Generalized anxiety disorder 02/24/2012  . Nocturnal hypoxemia 01/20/2012  . Abdominal mass 01/20/2012  . Dyspnea 01/20/2012  . Temporary cerebral vascular dysfunction 01/02/2009  . Benign hypertension 03/28/2008  . Abnormal blood sugar 03/03/2008  . Asthma, exogenous 10/22/2007  . Clinical depression 08/19/2006  . Adaptive colitis 03/12/2003    Past Surgical History  Procedure Laterality Date  . Cesarean section    . Abdominal hysterectomy  1990  . Breast surgery  1967  . Bladder suspension  1990  . Hemorrhoidectomy with hemorrhoid banding  1988  . Breast biopsy Right 1967  EXCISIONAL BX - NEG  . Breast cyst aspiration Right 1985    Her family history includes Allergies in her brother and sister; Anxiety disorder in her mother; Asthma in her maternal uncle; Bipolar disorder in her son; Breast cancer in her paternal grandmother and sister; Colon cancer in her mother; Depression in her father and sister; Heart disease in her maternal grandfather, maternal grandmother, and mother; Hypertension in her mother; Suicidality in her father.    Previous Medications   ALBUTEROL (PROAIR HFA) 108 (90 BASE) MCG/ACT INHALER    Inhale 2 puffs into the lungs every 6  (six) hours as needed.   AMLODIPINE (NORVASC) 5 MG TABLET    Take 5 mg by mouth daily.    ASPIRIN 81 MG TABLET    Take 81 mg by mouth daily.   KRILL OIL PO    Take 1 capsule by mouth daily.   LEVOTHYROXINE (SYNTHROID, LEVOTHROID) 112 MCG TABLET    Take 1 tablet (112 mcg total) by mouth daily.   LISINOPRIL (PRINIVIL,ZESTRIL) 20 MG TABLET    Take 20 mg by mouth daily.    METOPROLOL TARTRATE (LOPRESSOR) 25 MG TABLET    Take 25 mg by mouth 2 (two) times daily.   MULTIPLE VITAMIN (MULTIVITAMIN) CAPSULE    Take 1 capsule by mouth daily.   NORTRIPTYLINE (PAMELOR) 25 MG CAPSULE    Take 4 capsule at bed time   OMEGA 3-6-9 CAPS    Take by mouth.   QUETIAPINE (SEROQUEL) 25 MG TABLET    Take 25 mg by mouth at bedtime.     Patient Care Team: Margarita Rana, MD as PCP - General (Family Medicine)     Objective:   Vitals: BP 128/76 mmHg  Pulse 86  Temp(Src) 98.1 F (36.7 C) (Oral)  Resp 16  Ht 5\' 9"  (1.753 m)  Wt 188 lb (85.276 kg)  BMI 27.75 kg/m2  SpO2 96%  Physical Exam  Constitutional: She is oriented to person, place, and time. She appears well-developed and well-nourished.  HENT:  Head: Normocephalic and atraumatic.  Right Ear: Tympanic membrane, external ear and ear canal normal.  Left Ear: Tympanic membrane, external ear and ear canal normal.  Nose: Nose normal.  Mouth/Throat: Uvula is midline, oropharynx is clear and moist and mucous membranes are normal.  Eyes: Conjunctivae, EOM and lids are normal. Pupils are equal, round, and reactive to light.  Neck: Trachea normal and normal range of motion. Neck supple. Carotid bruit is not present. No thyroid mass and no thyromegaly present.  Cardiovascular: Normal rate, regular rhythm and normal heart sounds.   Pulmonary/Chest: Effort normal and breath sounds normal.  Abdominal: Soft. Normal appearance and bowel sounds are normal. There is no hepatosplenomegaly. There is no tenderness.  Genitourinary: No breast swelling, tenderness or  discharge.  Musculoskeletal: Normal range of motion.  Lymphadenopathy:    She has no cervical adenopathy.    She has no axillary adenopathy.  Neurological: She is alert and oriented to person, place, and time. She has normal strength. No cranial nerve deficit.  Skin: Skin is warm, dry and intact.  Psychiatric: She has a normal mood and affect. Her speech is normal and behavior is normal. Judgment and thought content normal. Cognition and memory are normal.    Activities of Daily Living In your present state of health, do you have any difficulty performing the following activities: 05/02/2015  Hearing? N  Vision? N  Difficulty concentrating or making decisions? N  Walking or climbing stairs? N  Dressing or bathing? N  Doing errands, shopping? N    Fall Risk Assessment Fall Risk  05/02/2015  Falls in the past year? No     Depression Screen PHQ 2/9 Scores 05/02/2015  PHQ - 2 Score 0    Cognitive Testing - 6-CIT  Correct? Score   What year is it? yes 0 0 or 4  What month is it? yes 0 0 or 3  Memorize:    Pia Mau,  43,  [address],  [place],      What time is it? (within 1 hour) yes 0 0 or 3  Count backwards from 20 yes 0 0, 2, or 4  Name the months of the year yes 0 0, 2, or 4  Repeat name & address above yes 1 0, 2, 4, 6, 8, or 10       TOTAL SCORE  1/28   Interpretation:  Normal  Normal (0-7) Abnormal (8-28)       Assessment & Plan:     Annual Wellness Visit  Reviewed patient's Family Medical History Reviewed and updated list of patient's medical providers Assessment of cognitive impairment was done Assessed patient's functional ability Established a written schedule for health screening [place] Completed and Reviewed  Exercise Activities and Dietary recommendations Goals    None      Immunization History  Administered Date(s) Administered  . Influenza Whole 01/16/2012  . Influenza-Unspecified 12/23/2012  . Pneumococcal  Conjugate-13 04/13/2014  . Pneumococcal Polysaccharide-23 01/16/2012  . Tdap 04/13/2014  . Zoster 01/29/2010   1. Medicare annual wellness visit, subsequent As above.   2. Hematuria Will send for evaluation.   - POCT urinalysis dipstick - Urine Microscopic  3. Hypercholesteremia Will check labs.   - Lipid Panel With LDL/HDL Ratio - Comprehensive metabolic panel   Patient was seen and examined by Jerrell Belfast, MD, and note scribed by Lynford Humphrey, [place].   I have reviewed the document for accuracy and completeness and I agree with above. Jerrell Belfast, MD   Margarita Rana, MD    ------------------------------------------------------------------------------------------------------------

## 2015-05-03 ENCOUNTER — Telehealth: Payer: Self-pay

## 2015-05-03 LAB — URINALYSIS, MICROSCOPIC ONLY: CASTS: NONE SEEN /LPF

## 2015-05-03 NOTE — Telephone Encounter (Signed)
LMTCB

## 2015-05-03 NOTE — Telephone Encounter (Signed)
Patient advised as directed below. 

## 2015-05-03 NOTE — Telephone Encounter (Signed)
-----   Message from Margarita Rana, MD sent at 05/03/2015  9:00 AM EST ----- No RBCs in urine.  Thanks.

## 2015-05-04 DIAGNOSIS — E7800 Pure hypercholesterolemia, unspecified: Secondary | ICD-10-CM | POA: Diagnosis not present

## 2015-05-05 LAB — COMPREHENSIVE METABOLIC PANEL
A/G RATIO: 1.4 (ref 1.1–2.5)
ALT: 16 IU/L (ref 0–32)
AST: 20 IU/L (ref 0–40)
Albumin: 4.3 g/dL (ref 3.6–4.8)
Alkaline Phosphatase: 117 IU/L (ref 39–117)
BILIRUBIN TOTAL: 0.5 mg/dL (ref 0.0–1.2)
BUN / CREAT RATIO: 11 (ref 11–26)
BUN: 10 mg/dL (ref 8–27)
CALCIUM: 9.5 mg/dL (ref 8.7–10.3)
CO2: 25 mmol/L (ref 18–29)
CREATININE: 0.94 mg/dL (ref 0.57–1.00)
Chloride: 92 mmol/L — ABNORMAL LOW (ref 96–106)
GFR calc Af Amer: 72 mL/min/{1.73_m2} (ref >59)
GFR calc non Af Amer: 63 mL/min/{1.73_m2} (ref >59)
Globulin, Total: 3 g/dL (ref 1.5–4.5)
Glucose: 104 mg/dL — ABNORMAL HIGH (ref 65–99)
POTASSIUM: 4.3 mmol/L (ref 3.5–5.2)
SODIUM: 133 mmol/L — AB (ref 134–144)
Total Protein: 7.3 g/dL (ref 6.0–8.5)

## 2015-05-05 LAB — LIPID PANEL WITH LDL/HDL RATIO
Cholesterol, Total: 217 mg/dL — ABNORMAL HIGH (ref 100–199)
HDL: 62 mg/dL (ref >39)
LDL CALC: 130 mg/dL — AB (ref 0–99)
LDL/HDL RATIO: 2.1 ratio (ref 0.0–3.2)
TRIGLYCERIDES: 124 mg/dL (ref 0–149)
VLDL Cholesterol Cal: 25 mg/dL (ref 5–40)

## 2015-05-08 ENCOUNTER — Other Ambulatory Visit (HOSPITAL_COMMUNITY): Payer: Self-pay | Admitting: Psychiatry

## 2015-05-08 NOTE — Progress Notes (Signed)
Patient ID: Wanda Ochoa, female   DOB: [birth year], 70 y.o.   MRN: [id number]  Not my patient

## 2015-05-18 ENCOUNTER — Telehealth: Payer: Self-pay | Admitting: Psychiatry

## 2015-05-18 NOTE — Telephone Encounter (Signed)
I spoke with Dr. Nicolasa Ducking yesterday evening about this patient. I was told that the patient's symptoms have been worsening and there was discussion about having her resume ECT possibly for a brief index or possibly as maintenance treatment. I called the patient this morning and confirmed that. Given her past history I agree that's an appropriate plan. I have spoken with same-day surgery and they have spoken with anesthesia. Were going to put her on the schedule for tomorrow and we will just get an EKG prior to treatment. Patient will be notified by same-day surgery and by our nursing about the appointment for tomorrow and I spoke to the patient as well and she is agreeable to that.

## 2015-05-19 ENCOUNTER — Encounter: Payer: Self-pay | Admitting: Anesthesiology

## 2015-05-19 ENCOUNTER — Encounter
Admission: RE | Admit: 2015-05-19 | Discharge: 2015-05-19 | Disposition: A | Discharge status: Home / Self Care | Payer: Medicare Other | Source: Ambulatory Visit | Attending: Psychiatry | Admitting: Psychiatry

## 2015-05-19 ENCOUNTER — Ambulatory Visit
Admission: RE | Admit: 2015-05-19 | Discharge: 2015-05-19 | Disposition: A | Discharge status: Home / Self Care | Payer: Medicare Other | Source: Ambulatory Visit | Attending: Psychiatry | Admitting: Psychiatry

## 2015-05-19 DIAGNOSIS — J45909 Unspecified asthma, uncomplicated: Secondary | ICD-10-CM | POA: Diagnosis not present

## 2015-05-19 DIAGNOSIS — E079 Disorder of thyroid, unspecified: Secondary | ICD-10-CM | POA: Diagnosis not present

## 2015-05-19 DIAGNOSIS — F419 Anxiety disorder, unspecified: Secondary | ICD-10-CM | POA: Diagnosis not present

## 2015-05-19 DIAGNOSIS — I10 Essential (primary) hypertension: Secondary | ICD-10-CM | POA: Insufficient documentation

## 2015-05-19 DIAGNOSIS — Z9889 Other specified postprocedural states: Secondary | ICD-10-CM | POA: Diagnosis not present

## 2015-05-19 DIAGNOSIS — F332 Major depressive disorder, recurrent severe without psychotic features: Secondary | ICD-10-CM | POA: Insufficient documentation

## 2015-05-19 DIAGNOSIS — Z880 Allergy status to penicillin: Secondary | ICD-10-CM | POA: Insufficient documentation

## 2015-05-19 DIAGNOSIS — Z01810 Encounter for preprocedural cardiovascular examination: Secondary | ICD-10-CM | POA: Diagnosis not present

## 2015-05-19 LAB — CBC
HEMATOCRIT: 34.1 % — AB (ref 35.0–47.0)
HEMOGLOBIN: 12 g/dL (ref 12.0–16.0)
MCH: 33.1 pg (ref 26.0–34.0)
MCHC: 35.2 g/dL (ref 32.0–36.0)
MCV: 93.9 fL (ref 80.0–100.0)
Platelets: 237 10*3/uL (ref 150–440)
RBC: 3.63 MIL/uL — AB (ref 3.80–5.20)
RDW: 12.3 % (ref 11.5–14.5)
WBC: 5.8 10*3/uL (ref 3.6–11.0)

## 2015-05-19 MED ORDER — METOPROLOL SUCCINATE ER 25 MG PO TB24
25.0000 mg | ORAL_TABLET | Freq: Every day | ORAL | Status: AC
  Administered 2015-05-19: 25 mg via ORAL
  Filled 2015-05-19: qty 1

## 2015-05-19 MED ORDER — SUCCINYLCHOLINE CHLORIDE 20 MG/ML IJ SOLN
INTRAMUSCULAR | Status: DC | PRN
  Administered 2015-05-19: 80 mg via INTRAVENOUS

## 2015-05-19 MED ORDER — SODIUM CHLORIDE 0.9 % IV SOLN
250.0000 mL | Freq: Once | INTRAVENOUS | Status: AC
  Administered 2015-05-19: 500 mL via INTRAVENOUS

## 2015-05-19 MED ORDER — LABETALOL HCL 5 MG/ML IV SOLN
10.0000 mg | Freq: Once | INTRAVENOUS | Status: AC
  Administered 2015-05-19: 10 mg via INTRAVENOUS

## 2015-05-19 MED ORDER — FENTANYL CITRATE (PF) 100 MCG/2ML IJ SOLN: 25.0000 ug | INTRAMUSCULAR | Status: DC | PRN

## 2015-05-19 MED ORDER — ONDANSETRON HCL 4 MG/2ML IJ SOLN: 4.0000 mg | Freq: Once | INTRAMUSCULAR | Status: DC | PRN

## 2015-05-19 MED ORDER — METHOHEXITAL SODIUM 100 MG/10ML IV SOSY
PREFILLED_SYRINGE | INTRAVENOUS | Status: DC | PRN
  Administered 2015-05-19: 80 mg via INTRAVENOUS

## 2015-05-19 NOTE — Anesthesia Postprocedure Evaluation (Signed)
Anesthesia Post Note  Patient: Wanda Ochoa  Procedure(s) Performed: * No procedures listed *  Patient location during evaluation: PACU Anesthesia Type: General Level of consciousness: awake and alert and oriented Pain management: pain level controlled Vital Signs Assessment: post-procedure vital signs reviewed and stable Respiratory status: spontaneous breathing Cardiovascular status: blood pressure returned to baseline Anesthetic complications: no    Last Vitals:  Filed Vitals:   05/19/15 1219 05/19/15 1232  BP: 176/83 179/79  Pulse: 68 65  Temp:    Resp: 17 18    Last Pain: There were no vitals filed for this visit.               Marcellius Montagna

## 2015-05-19 NOTE — Anesthesia Preprocedure Evaluation (Signed)
Anesthesia Evaluation  Patient identified by MRN, date of birth, ID band Patient awake    Reviewed: Allergy & Precautions, NPO status , [name] Chart, lab work & pertinent test results  Airway Mallampati: II  TM Distance: <3 FB     Dental  (+) Caps   Pulmonary shortness of breath and with exertion, asthma ,    Pulmonary exam normal breath sounds clear to auscultation       Cardiovascular hypertension, Pt. on medications Normal cardiovascular exam     Neuro/Psych Anxiety Depression    GI/Hepatic negative GI ROS, Neg liver ROS,   Endo/Other  Hypothyroidism   Renal/GU negative Renal ROS  negative genitourinary   Musculoskeletal Back strain   Abdominal Normal abdominal exam  (+)   Peds negative pediatric ROS (+)  Hematology   Anesthesia Other Findings   Reproductive/Obstetrics negative OB ROS                             Anesthesia Physical Anesthesia Plan  ASA: III  Anesthesia Plan: General   Post-op Pain Management:    Induction: Intravenous  Airway Management Planned: Mask  Additional Equipment:   Intra-op Plan:   Post-operative Plan:   Informed Consent: I have reviewed the patients History and Physical, chart, labs and discussed the procedure including the risks, benefits and alternatives for the proposed anesthesia with the patient or authorized representative who has indicated his/her understanding and acceptance.   Dental advisory given  Plan Discussed with: Surgeon and CRNA  Anesthesia Plan Comments:         Anesthesia Quick Evaluation

## 2015-05-19 NOTE — H&P (Signed)
Wanda Ochoa is an 70 y.o. female.   Chief Complaint: Patient has a history of recurrent major depression and has recently been having more anxiety and depression. She spoke with her primary psychiatrist and based on her past history of extremely good response to ECT in the fact that she is currently not responding to medication as she had previously she recommended that we consider ECT. Patient agrees to the plan. She has been briefed on the procedure and the risks and benefits of treatment. We will be doing right unilateral treatment today. HPI: No physical complaints. No acute pain no chest complaints no shortness of breath  Past Medical History  Diagnosis Date  . Thyroid disease   . Asthma   . HTN (hypertension)   . Anxiety     Past Surgical History  Procedure Laterality Date  . Cesarean section    . Abdominal hysterectomy  1990  . Breast surgery  1967  . Bladder suspension  1990  . Hemorrhoidectomy with hemorrhoid banding  1988  . Breast biopsy Right 1967    EXCISIONAL BX - NEG  . Breast cyst aspiration Right 1985    Family History  Problem Relation Age of Onset  . Depression Father   . Suicidality Father   . Depression Sister   . Breast cancer Sister   . Bipolar disorder Son   . Allergies Brother   . Allergies Sister   . Asthma Maternal Uncle   . Heart disease Mother   . Colon cancer Mother   . Hypertension Mother   . Anxiety disorder Mother   . Heart disease Maternal Grandfather   . Heart disease Maternal Grandmother   . Breast cancer Paternal Grandmother    Social History:  reports that she has never smoked. She has never used smokeless tobacco. She reports that she does not drink alcohol or use illicit drugs.  Allergies:  Allergies  Allergen Reactions  . Penicillins Anaphylaxis     (Not in a hospital admission)  Results for orders placed or performed during the hospital encounter of 05/19/15 (from the past 48 hour(s))  CBC     Status: Abnormal   Collection Time: 05/19/15  9:40 AM  Result Value Ref Range   WBC 5.8 3.6 - 11.0 K/uL   RBC 3.63 (L) 3.80 - 5.20 MIL/uL   Hemoglobin 12.0 12.0 - 16.0 g/dL   HCT 34.1 (L) 35.0 - 47.0 %   MCV 93.9 80.0 - 100.0 fL   MCH 33.1 26.0 - 34.0 pg   MCHC 35.2 32.0 - 36.0 g/dL   RDW 12.3 11.5 - 14.5 %   Platelets 237 150 - 440 K/uL   Dg Chest 2 View  05/19/2015  CLINICAL DATA:  Asthma EXAM: CHEST  2 VIEW COMPARISON:  09/16/2012 FINDINGS: Cardiac and mediastinal contours are normal. Negative for heart failure. Lungs are clear without infiltrate or effusion. No mass or adenopathy. IMPRESSION: No active cardiopulmonary disease. Electronically Signed   By: Franchot Gallo M.D.   On: 05/19/2015 10:29    Review of Systems  Constitutional: Negative.   HENT: Negative.   Eyes: Negative.   Respiratory: Negative.   Cardiovascular: Negative.   Gastrointestinal: Negative.   Musculoskeletal: Negative.   Skin: Negative.   Neurological: Negative.   Psychiatric/Behavioral: Positive for depression. Negative for suicidal ideas, hallucinations, memory loss and substance abuse. The patient is nervous/anxious and has insomnia.     Blood pressure 188/78, pulse 73, temperature 98.4 F (36.9 C), temperature source Oral, resp.  rate 18, height 5\' 9"  (1.753 m), weight 83.462 kg (184 lb), SpO2 100 %. Physical Exam  Nursing note and vitals reviewed. Constitutional: She appears well-developed and well-nourished.  HENT:  Head: Normocephalic and atraumatic.  Eyes: Conjunctivae are normal. Pupils are equal, round, and reactive to light.  Neck: Normal range of motion.  Cardiovascular: Normal rate, regular rhythm and normal heart sounds.   Respiratory: Effort normal and breath sounds normal. No respiratory distress.  GI: Soft.  Musculoskeletal: Normal range of motion.  Neurological: She is alert.  Skin: Skin is warm and dry.  Psychiatric: Judgment and thought content normal. Her mood appears anxious. Her affect is blunt.  Her speech is delayed. She is slowed. Cognition and memory are normal.     Assessment/Plan Patient will receive right unilateral treatment today and we will schedule follow-up for Monday. Previously she had done very well with a very small number of treatments. We will see how she is doing on Monday and can reassess the need for continued treatment send possible maintenance. Patient agrees to the plan.  [name], MD 05/19/2015, 11:21 AM

## 2015-05-19 NOTE — Procedures (Signed)
[facility] Physician's Interval Evaluation & Treatment Note  Patient Identification: Wanda Ochoa MRN:  [id number] Date of Evaluation:  05/19/2015 TX #: 1  MADRS: 22  MMSE: 30  P.E. Findings:  No physical complaints. Physical exam normal. EKG unremarkable. Chest x-ray normal periods nothing remarkable on lab studies.  Psychiatric Interval Note:  Mood is feeling more depressed and anxious some trouble sleeping and concentrating no suicidal thoughts  Subjective:  Patient is a 70 y.o. female seen for evaluation for Electroconvulsive Therapy. No suicidal thinking no psychosis  Treatment Summary:   [x]   Right Unilateral             []  Bilateral   % Energy : 0.3 ms 75%   Impedance: 1370 ohms  Seizure Energy Index: 6712 V squared  Postictal Suppression Index: 73%  Seizure Concordance Index: 87%  Medications  Pre Shock: Toradol 15 mg, Brevital 80 mg, succinylcholine 80 mg  Post Shock:    Seizure Duration: 26 seconds by EMG, 42 seconds by EEG   Comments: We will see her back on Monday the27th Reevaluate at that point the need for further treatments and appropriate schedule  Lungs:  [x]   Clear to auscultation               []  Other:   Heart:    [x]   Regular rhythm             []  irregular rhythm    [x]   Previous H&P reviewed, patient examined and there are NO CHANGES                 []   Previous H&P reviewed, patient examined and there are changes noted.   [name], MD 2/24/201711:23 AM

## 2015-05-19 NOTE — Transfer of Care (Signed)
Immediate Anesthesia Transfer of Care Note  Patient: Wanda Ochoa  Procedure(s) Performed: * No procedures listed *  Patient Location: PACU  Anesthesia Type:General  Level of Consciousness: sedated  Airway & Oxygen Therapy: Patient Spontanous Breathing and Patient connected to face mask oxygen  Post-op Assessment: Report given to RN and Post -op Vital signs reviewed and stable  Post vital signs: Reviewed and stable  Last Vitals:  Filed Vitals:   05/19/15 0930 05/19/15 0958  BP: 188/78 188/78  Pulse: 73 73  Temp: 36.9 C   Resp: 18     Complications: No apparent anesthesia complications

## 2015-05-22 ENCOUNTER — Encounter: Payer: Self-pay | Admitting: Anesthesiology

## 2015-05-22 ENCOUNTER — Encounter (HOSPITAL_BASED_OUTPATIENT_CLINIC_OR_DEPARTMENT_OTHER)
Admission: RE | Admit: 2015-05-22 | Discharge: 2015-05-22 | Disposition: A | Discharge status: Home / Self Care | Payer: Medicare Other | Source: Ambulatory Visit | Attending: Psychiatry | Admitting: Psychiatry

## 2015-05-22 DIAGNOSIS — E079 Disorder of thyroid, unspecified: Secondary | ICD-10-CM | POA: Diagnosis not present

## 2015-05-22 DIAGNOSIS — F332 Major depressive disorder, recurrent severe without psychotic features: Secondary | ICD-10-CM

## 2015-05-22 DIAGNOSIS — J45909 Unspecified asthma, uncomplicated: Secondary | ICD-10-CM | POA: Diagnosis not present

## 2015-05-22 DIAGNOSIS — Z9889 Other specified postprocedural states: Secondary | ICD-10-CM | POA: Diagnosis not present

## 2015-05-22 DIAGNOSIS — I10 Essential (primary) hypertension: Secondary | ICD-10-CM | POA: Diagnosis not present

## 2015-05-22 DIAGNOSIS — F419 Anxiety disorder, unspecified: Secondary | ICD-10-CM | POA: Diagnosis not present

## 2015-05-22 MED ORDER — LABETALOL HCL 5 MG/ML IV SOLN
INTRAVENOUS | Status: DC | PRN
  Administered 2015-05-22: 10 mg via INTRAVENOUS

## 2015-05-22 MED ORDER — KETOROLAC TROMETHAMINE 30 MG/ML IJ SOLN
30.0000 mg | Freq: Once | INTRAMUSCULAR | Status: AC
  Administered 2015-05-22: 30 mg via INTRAVENOUS

## 2015-05-22 MED ORDER — METHOHEXITAL SODIUM 100 MG/10ML IV SOSY
PREFILLED_SYRINGE | INTRAVENOUS | Status: DC | PRN
  Administered 2015-05-22: 80 mg via INTRAVENOUS

## 2015-05-22 MED ORDER — SUCCINYLCHOLINE CHLORIDE 20 MG/ML IJ SOLN
INTRAMUSCULAR | Status: DC | PRN
  Administered 2015-05-22: 80 mg via INTRAVENOUS

## 2015-05-22 MED ORDER — LABETALOL HCL 5 MG/ML IV SOLN
5.0000 mg | Freq: Once | INTRAVENOUS | Status: AC
  Administered 2015-05-22: 5 mg via INTRAVENOUS

## 2015-05-22 MED ORDER — SODIUM CHLORIDE 0.9 % IV SOLN
250.0000 mL | Freq: Once | INTRAVENOUS | Status: AC
  Administered 2015-05-22: 10:00:00 via INTRAVENOUS

## 2015-05-22 NOTE — Anesthesia Procedure Notes (Signed)
Date/Time: 05/22/2015 10:28 AM Performed by: Allean Found Pre-anesthesia Checklist: Patient identified, Emergency Drugs available, Suction available, Patient being monitored and Timeout performed Patient Re-evaluated:Patient Re-evaluated prior to inductionOxygen Delivery Method: Circle system utilized and Simple face mask Preoxygenation: Pre-oxygenation with 100% oxygen Intubation Type: IV induction Dental Injury: Teeth and Oropharynx as per pre-operative assessment

## 2015-05-22 NOTE — Procedures (Signed)
ECT SERVICES Physician's Interval Evaluation & Treatment Note  Patient Identification: Wanda Ochoa MRN:  [id number] Date of Evaluation:  05/22/2015 TX #: 2  MADRS:   MMSE:   P.E. Findings:  No change to physical exam except for an elevated blood pressure prior to treatment especially the systolic.  Psychiatric Interval Note:  Mood is reported to still be dominated by anxiety and dysphoria  Subjective:  Patient is a 70 y.o. female seen for evaluation for Electroconvulsive Therapy. No specific new complaint  Treatment Summary:   [x]   Right Unilateral             []  Bilateral   % Energy : 0.3 ms 75%   Impedance: 1330 ohms  Seizure Energy Index: 10,987 V squared  Postictal Suppression Index: 89%  Seizure Concordance Index: 96%  Medications  Pre Shock: Toradol 30 mg, labetalol 10 mg, Brevital 80 mg, succinylcholine 80 mg  Post Shock:    Seizure Duration: 20 seconds by EMG, 38 seconds by EEG   Comments: Follow-up Wednesday   Lungs:  [x]   Clear to auscultation               []  Other:   Heart:    [x]   Regular rhythm             []  irregular rhythm    [x]   Previous H&P reviewed, patient examined and there are NO CHANGES                 []   Previous H&P reviewed, patient examined and there are changes noted.   [name], MD 2/27/201710:27 AM

## 2015-05-22 NOTE — Anesthesia Preprocedure Evaluation (Signed)
Anesthesia Evaluation  Patient identified by MRN, date of birth, ID band Patient awake    Reviewed: Allergy & Precautions, NPO status , [name] Chart, lab work & pertinent test results  Airway Mallampati: II  TM Distance: <3 FB     Dental  (+) Caps   Pulmonary shortness of breath and with exertion, asthma ,    Pulmonary exam normal breath sounds clear to auscultation       Cardiovascular hypertension, Pt. on medications Normal cardiovascular exam     Neuro/Psych Anxiety Depression    GI/Hepatic negative GI ROS, Neg liver ROS,   Endo/Other  Hypothyroidism   Renal/GU negative Renal ROS  negative genitourinary   Musculoskeletal Back strain   Abdominal Normal abdominal exam  (+)   Peds negative pediatric ROS (+)  Hematology   Anesthesia Other Findings   Reproductive/Obstetrics negative OB ROS                             Anesthesia Physical  Anesthesia Plan  ASA: III  Anesthesia Plan: General   Post-op Pain Management:    Induction: Intravenous  Airway Management Planned: Mask  Additional Equipment:   Intra-op Plan:   Post-operative Plan:   Informed Consent: I have reviewed the patients History and Physical, chart, labs and discussed the procedure including the risks, benefits and alternatives for the proposed anesthesia with the patient or authorized representative who has indicated his/her understanding and acceptance.   Dental advisory given  Plan Discussed with: Surgeon and CRNA  Anesthesia Plan Comments:         Anesthesia Quick Evaluation

## 2015-05-22 NOTE — Transfer of Care (Signed)
Immediate Anesthesia Transfer of Care Note  Patient: Wanda Ochoa  Procedure(s) Performed: * No procedures listed *  Patient Location: PACU  Anesthesia Type:General  Level of Consciousness: awake  Airway & Oxygen Therapy: Patient Spontanous Breathing and Patient connected to face mask oxygen  Post-op Assessment: Report given to RN and Post -op Vital signs reviewed and stable  Post vital signs: Reviewed and stable  Last Vitals:  Filed Vitals:   05/22/15 0831  BP: 177/83  Pulse: 79  Temp: 35.7 C  Resp: 16    Complications: No apparent anesthesia complications

## 2015-05-22 NOTE — H&P (Signed)
Wanda Ochoa is an 70 y.o. female.   Chief Complaint: Continues to feel anxious ruminative and depressed. Withdrawn. No suicidal thoughts. No psychosis. HPI: Return of depression with anxiety.  Past Medical History  Diagnosis Date  . Thyroid disease   . Asthma   . HTN (hypertension)   . Anxiety     Past Surgical History  Procedure Laterality Date  . Cesarean section    . Abdominal hysterectomy  1990  . Breast surgery  1967  . Bladder suspension  1990  . Hemorrhoidectomy with hemorrhoid banding  1988  . Breast biopsy Right 1967    EXCISIONAL BX - NEG  . Breast cyst aspiration Right 1985    Family History  Problem Relation Age of Onset  . Depression Father   . Suicidality Father   . Depression Sister   . Breast cancer Sister   . Bipolar disorder Son   . Allergies Brother   . Allergies Sister   . Asthma Maternal Uncle   . Heart disease Mother   . Colon cancer Mother   . Hypertension Mother   . Anxiety disorder Mother   . Heart disease Maternal Grandfather   . Heart disease Maternal Grandmother   . Breast cancer Paternal Grandmother    Social History:  reports that she has never smoked. She has never used smokeless tobacco. She reports that she does not drink alcohol or use illicit drugs.  Allergies:  Allergies  Allergen Reactions  . Penicillins Anaphylaxis     (Not in a hospital admission)  No results found for this or any previous visit (from the past 48 hour(s)). No results found.  Review of Systems  Constitutional: Negative.   HENT: Negative.   Eyes: Negative.   Respiratory: Negative.   Cardiovascular: Negative.   Gastrointestinal: Negative.   Musculoskeletal: Negative.   Skin: Negative.   Neurological: Negative.   Psychiatric/Behavioral: Positive for depression. Negative for suicidal ideas, hallucinations, memory loss and substance abuse. The patient is nervous/anxious. The patient does not have insomnia.     Blood pressure 177/83, pulse 79,  temperature 96.2 F (35.7 C), temperature source Oral, resp. rate 16, height 5\' 9"  (1.753 m), weight 82.101 kg (181 lb), SpO2 98 %. Physical Exam  Nursing note and vitals reviewed. Constitutional: She appears well-developed and well-nourished.  HENT:  Head: Normocephalic and atraumatic.  Eyes: Conjunctivae are normal. Pupils are equal, round, and reactive to light.  Neck: Normal range of motion.  Cardiovascular: Normal rate, regular rhythm and normal heart sounds.   Respiratory: Effort normal and breath sounds normal. No respiratory distress.  GI: Soft.  Musculoskeletal: Normal range of motion.  Neurological: She is alert.  Skin: Skin is warm and dry.  Psychiatric: Her speech is normal and behavior is normal. Judgment and thought content normal. Her mood appears anxious. Cognition and memory are normal.     Assessment/Plan Blood pressure is elevated today and anesthesia will be pretreating. Patient tolerated first treatment without difficulty. Plan is for her to continue treatment with scheduled Wednesday and likely Friday and continued ongoing evaluation looking for improvement.  [name], MD 05/22/2015, 10:25 AM

## 2015-05-24 ENCOUNTER — Encounter: Payer: Self-pay | Admitting: Anesthesiology

## 2015-05-24 ENCOUNTER — Encounter
Admission: RE | Admit: 2015-05-24 | Discharge: 2015-05-24 | Disposition: A | Discharge status: Home / Self Care | Payer: Medicare Other | Source: Ambulatory Visit | Attending: Psychiatry | Admitting: Psychiatry

## 2015-05-24 DIAGNOSIS — I10 Essential (primary) hypertension: Secondary | ICD-10-CM | POA: Insufficient documentation

## 2015-05-24 DIAGNOSIS — F332 Major depressive disorder, recurrent severe without psychotic features: Secondary | ICD-10-CM | POA: Insufficient documentation

## 2015-05-24 DIAGNOSIS — E079 Disorder of thyroid, unspecified: Secondary | ICD-10-CM | POA: Diagnosis not present

## 2015-05-24 DIAGNOSIS — J45909 Unspecified asthma, uncomplicated: Secondary | ICD-10-CM | POA: Insufficient documentation

## 2015-05-24 DIAGNOSIS — F419 Anxiety disorder, unspecified: Secondary | ICD-10-CM | POA: Insufficient documentation

## 2015-05-24 DIAGNOSIS — Z880 Allergy status to penicillin: Secondary | ICD-10-CM | POA: Insufficient documentation

## 2015-05-24 DIAGNOSIS — F418 Other specified anxiety disorders: Secondary | ICD-10-CM | POA: Diagnosis not present

## 2015-05-24 MED ORDER — LABETALOL HCL 5 MG/ML IV SOLN
INTRAVENOUS | Status: DC | PRN
  Administered 2015-05-24: 10 mg via INTRAVENOUS

## 2015-05-24 MED ORDER — SODIUM CHLORIDE 0.9 % IV SOLN
INTRAVENOUS | Status: DC | PRN
  Administered 2015-05-24: 10:00:00 via INTRAVENOUS

## 2015-05-24 MED ORDER — METHOHEXITAL SODIUM 100 MG/10ML IV SOSY
PREFILLED_SYRINGE | INTRAVENOUS | Status: DC | PRN
  Administered 2015-05-24: 80 mg via INTRAVENOUS

## 2015-05-24 MED ORDER — KETOROLAC TROMETHAMINE 30 MG/ML IJ SOLN
30.0000 mg | Freq: Once | INTRAMUSCULAR | Status: AC
  Administered 2015-05-24: 30 mg via INTRAVENOUS

## 2015-05-24 MED ORDER — ONDANSETRON HCL 4 MG/2ML IJ SOLN: 4.0000 mg | Freq: Once | INTRAMUSCULAR | Status: DC | PRN

## 2015-05-24 MED ORDER — FENTANYL CITRATE (PF) 100 MCG/2ML IJ SOLN: 25.0000 ug | INTRAMUSCULAR | Status: DC | PRN

## 2015-05-24 MED ORDER — SODIUM CHLORIDE 0.9 % IV SOLN
250.0000 mL | Freq: Once | INTRAVENOUS | Status: AC
  Administered 2015-05-24: 500 mL via INTRAVENOUS

## 2015-05-24 MED ORDER — SUCCINYLCHOLINE 20MG/ML (10ML) SYRINGE FOR MEDFUSION PUMP - OPTIME
INTRAMUSCULAR | Status: DC | PRN
  Administered 2015-05-24: 80 mg via INTRAVENOUS

## 2015-05-24 NOTE — Anesthesia Postprocedure Evaluation (Signed)
Anesthesia Post Note  Patient: Wanda Ochoa  Procedure(s) Performed: * No procedures listed *  Patient location during evaluation: PACU Anesthesia Type: General Level of consciousness: awake and alert and oriented Pain management: pain level controlled Vital Signs Assessment: post-procedure vital signs reviewed and stable Respiratory status: spontaneous breathing Cardiovascular status: blood pressure returned to baseline Anesthetic complications: no    Last Vitals:  Filed Vitals:   05/24/15 1100 05/24/15 1109  BP: 168/80 148/79  Pulse: 69 66  Temp: 36.8 C   Resp: 19 18    Last Pain: There were no vitals filed for this visit.               Ledarius Leeson

## 2015-05-24 NOTE — H&P (Signed)
Wanda Ochoa is an 70 y.o. female.   Chief Complaint: Decrease in anxiety. Feeling a little better. Not aware of significant side effects HPI: Mood had been more anxious and a little bit more down recently.  Past Medical History  Diagnosis Date  . Thyroid disease   . Asthma   . HTN (hypertension)   . Anxiety     Past Surgical History  Procedure Laterality Date  . Cesarean section    . Abdominal hysterectomy  1990  . Breast surgery  1967  . Bladder suspension  1990  . Hemorrhoidectomy with hemorrhoid banding  1988  . Breast biopsy Right 1967    EXCISIONAL BX - NEG  . Breast cyst aspiration Right 1985    Family History  Problem Relation Age of Onset  . Depression Father   . Suicidality Father   . Depression Sister   . Breast cancer Sister   . Bipolar disorder Son   . Allergies Brother   . Allergies Sister   . Asthma Maternal Uncle   . Heart disease Mother   . Colon cancer Mother   . Hypertension Mother   . Anxiety disorder Mother   . Heart disease Maternal Grandfather   . Heart disease Maternal Grandmother   . Breast cancer Paternal Grandmother    Social History:  reports that she has never smoked. She has never used smokeless tobacco. She reports that she does not drink alcohol or use illicit drugs.  Allergies:  Allergies  Allergen Reactions  . Penicillins Anaphylaxis     (Not in a hospital admission)  No results found for this or any previous visit (from the past 48 hour(s)). No results found.  Review of Systems  Constitutional: Negative.   HENT: Negative.   Eyes: Negative.   Respiratory: Negative.   Cardiovascular: Negative.   Gastrointestinal: Negative.   Musculoskeletal: Negative.   Skin: Negative.   Neurological: Negative.   Psychiatric/Behavioral: Negative for depression, suicidal ideas, hallucinations, memory loss and substance abuse. The patient is nervous/anxious. The patient does not have insomnia.     Blood pressure 174/84, pulse  67, temperature 96.4 F (35.8 C), temperature source Tympanic, resp. rate 18, height 5\' 9"  (1.753 m), weight 83.008 kg (183 lb), SpO2 99 %. Physical Exam  Nursing note and vitals reviewed. Constitutional: She appears well-developed and well-nourished.  HENT:  Head: Normocephalic and atraumatic.  Eyes: Conjunctivae are normal. Pupils are equal, round, and reactive to light.  Neck: Normal range of motion.  Cardiovascular: Normal rate, regular rhythm and normal heart sounds.   Respiratory: Effort normal and breath sounds normal. No respiratory distress.  GI: Soft.  Musculoskeletal: Normal range of motion.  Neurological: She is alert.  Skin: Skin is warm and dry.  Psychiatric: Her speech is normal and behavior is normal. Judgment and thought content normal. Her mood appears anxious. Cognition and memory are normal.     Assessment/Plan Recommend treatment again on Friday for a total of 4 treatments and then reevaluate  [name], MD 05/24/2015, 10:09 AM

## 2015-05-24 NOTE — Procedures (Addendum)
ECT SERVICES Physician's Interval Evaluation & Treatment Note  Patient Identification: Wanda Ochoa MRN:  [id number] Date of Evaluation:  05/24/2015 TX #: 3  MADRS:   MMSE:   P.E. Findings:  No new physical findings vital signs stable lungs and heart normal  Psychiatric Interval Note:  Mood is feeling less anxious less depressed improve no side effects reported  Subjective:  Patient is a 70 y.o. female seen for evaluation for Electroconvulsive Therapy. No specific new complaints  Treatment Summary:   [x]   Right Unilateral             []  Bilateral   % Energy : 0.3 ms 75%   Impedance: 1070 ohms  Seizure Energy Index: 5588 V squared  Postictal Suppression Index: 76%  Seizure Concordance Index: 76%  Medications  Pre Shock: Toradol 30 mg, Brevital 80 mg, succinylcholine 80 mg  Post Shock: Labetalol 10 mg  Seizure Duration: 17 seconds by EMG, 29 seconds by EEG   Comments: Follow-up on Friday and then reassess with possible termination of this index course   Lungs:  [x]   Clear to auscultation               []  Other:   Heart:    [x]   Regular rhythm             []  irregular rhythm    [x]   Previous H&P reviewed, patient examined and there are NO CHANGES                 []   Previous H&P reviewed, patient examined and there are changes noted.   [name], MD 3/1/201710:12 AM

## 2015-05-24 NOTE — Anesthesia Procedure Notes (Signed)
Date/Time: 05/24/2015 10:17 AM Performed by: Dionne Bucy Pre-anesthesia Checklist: Patient identified, Emergency Drugs available, Suction available and Patient being monitored Patient Re-evaluated:Patient Re-evaluated prior to inductionOxygen Delivery Method: Circle system utilized Preoxygenation: Pre-oxygenation with 100% oxygen Intubation Type: IV induction Ventilation: Mask ventilation without difficulty and Mask ventilation throughout procedure Airway Equipment and Method: Bite block Placement Confirmation: positive ETCO2 Dental Injury: Teeth and Oropharynx as per pre-operative assessment

## 2015-05-24 NOTE — Anesthesia Preprocedure Evaluation (Signed)
Anesthesia Evaluation  Patient identified by MRN, date of birth, ID band Patient awake    Reviewed: Allergy & Precautions, NPO status , [name] Chart, lab work & pertinent test results  Airway Mallampati: II  TM Distance: <3 FB     Dental  (+) Caps   Pulmonary shortness of breath and with exertion, asthma ,    Pulmonary exam normal breath sounds clear to auscultation       Cardiovascular hypertension, Pt. on medications Normal cardiovascular exam     Neuro/Psych PSYCHIATRIC DISORDERS Anxiety Depression    GI/Hepatic negative GI ROS, Neg liver ROS,   Endo/Other  Hypothyroidism   Renal/GU negative Renal ROS  negative genitourinary   Musculoskeletal Back strain   Abdominal Normal abdominal exam  (+)   Peds negative pediatric ROS (+)  Hematology   Anesthesia Other Findings   Reproductive/Obstetrics negative OB ROS                             Anesthesia Physical  Anesthesia Plan  ASA: III  Anesthesia Plan: General   Post-op Pain Management:    Induction: Intravenous  Airway Management Planned: Mask  Additional Equipment:   Intra-op Plan:   Post-operative Plan:   Informed Consent: I have reviewed the patients History and Physical, chart, labs and discussed the procedure including the risks, benefits and alternatives for the proposed anesthesia with the patient or authorized representative who has indicated his/her understanding and acceptance.   Dental advisory given  Plan Discussed with: Surgeon and CRNA  Anesthesia Plan Comments:         Anesthesia Quick Evaluation

## 2015-05-24 NOTE — Transfer of Care (Signed)
Immediate Anesthesia Transfer of Care Note  Patient: Wanda Ochoa  Procedure(s) Performed: ECT  Patient Location: PACU  Anesthesia Type:General  Level of Consciousness: awake and patient cooperative  Airway & Oxygen Therapy: Patient Spontanous Breathing and Patient connected to face mask oxygen  Post-op Assessment: Report given to RN  Post vital signs: Reviewed and stable  Last Vitals:  Filed Vitals:   05/24/15 1030 05/24/15 1032  BP: 183/102 183/102  Pulse: 74 76  Temp: 37 C 98.71F  Resp: 15 17    Complications: No apparent anesthesia complications

## 2015-05-25 NOTE — Anesthesia Postprocedure Evaluation (Signed)
Anesthesia Post Note  Patient: Wanda Ochoa  Procedure(s) Performed: * No procedures listed *  Patient location during evaluation: PACU Anesthesia Type: General Level of consciousness: awake and alert Pain management: pain level controlled Vital Signs Assessment: post-procedure vital signs reviewed and stable Respiratory status: spontaneous breathing, nonlabored ventilation, respiratory function stable and patient connected to nasal cannula oxygen Cardiovascular status: blood pressure returned to baseline and stable Postop Assessment: no signs of nausea or vomiting Anesthetic complications: no    Last Vitals:  Filed Vitals:   05/22/15 1144 05/22/15 1209  BP:  166/74  Pulse: 65   Temp: 36.1 C   Resp: 12     Last Pain:  Filed Vitals:   05/22/15 1210  PainSc: 0-No pain                 Martha Clan

## 2015-05-25 NOTE — Addendum Note (Signed)
Addendum  created 05/25/15 1255 by Martha Clan, MD   Modules edited: Clinical Notes   Clinical Notes:  File: [id number]

## 2015-05-26 ENCOUNTER — Encounter: Payer: Self-pay | Admitting: Anesthesiology

## 2015-05-26 ENCOUNTER — Encounter (HOSPITAL_BASED_OUTPATIENT_CLINIC_OR_DEPARTMENT_OTHER)
Admission: RE | Admit: 2015-05-26 | Discharge: 2015-05-26 | Disposition: A | Discharge status: Home / Self Care | Payer: Medicare Other | Source: Ambulatory Visit | Attending: Psychiatry | Admitting: Psychiatry

## 2015-05-26 DIAGNOSIS — J45909 Unspecified asthma, uncomplicated: Secondary | ICD-10-CM | POA: Diagnosis not present

## 2015-05-26 DIAGNOSIS — F419 Anxiety disorder, unspecified: Secondary | ICD-10-CM | POA: Diagnosis not present

## 2015-05-26 DIAGNOSIS — E079 Disorder of thyroid, unspecified: Secondary | ICD-10-CM | POA: Diagnosis not present

## 2015-05-26 DIAGNOSIS — F332 Major depressive disorder, recurrent severe without psychotic features: Secondary | ICD-10-CM | POA: Diagnosis not present

## 2015-05-26 DIAGNOSIS — Z880 Allergy status to penicillin: Secondary | ICD-10-CM | POA: Diagnosis not present

## 2015-05-26 DIAGNOSIS — I10 Essential (primary) hypertension: Secondary | ICD-10-CM | POA: Diagnosis not present

## 2015-05-26 MED ORDER — SODIUM CHLORIDE 0.9 % IV SOLN
250.0000 mL | Freq: Once | INTRAVENOUS | Status: AC
  Administered 2015-05-26: 500 mL via INTRAVENOUS

## 2015-05-26 MED ORDER — KETOROLAC TROMETHAMINE 30 MG/ML IJ SOLN
30.0000 mg | Freq: Once | INTRAMUSCULAR | Status: AC
  Administered 2015-05-26: 30 mg via INTRAVENOUS

## 2015-05-26 MED ORDER — LABETALOL HCL 5 MG/ML IV SOLN
INTRAVENOUS | Status: DC | PRN
  Administered 2015-05-26: 10 mg via INTRAVENOUS

## 2015-05-26 MED ORDER — LABETALOL HCL 5 MG/ML IV SOLN
10.0000 mg | Freq: Once | INTRAVENOUS | Status: AC
  Administered 2015-05-26: 10 mg via INTRAVENOUS

## 2015-05-26 MED ORDER — SODIUM CHLORIDE 0.9 % IV SOLN
INTRAVENOUS | Status: DC | PRN
  Administered 2015-05-26: 11:00:00 via INTRAVENOUS

## 2015-05-26 MED ORDER — METHOHEXITAL SODIUM 100 MG/10ML IV SOSY
PREFILLED_SYRINGE | INTRAVENOUS | Status: DC | PRN
  Administered 2015-05-26: 80 mg via INTRAVENOUS

## 2015-05-26 MED ORDER — SUCCINYLCHOLINE 20MG/ML (10ML) SYRINGE FOR MEDFUSION PUMP - OPTIME
INTRAMUSCULAR | Status: DC | PRN
  Administered 2015-05-26: 80 mg via INTRAVENOUS

## 2015-05-26 NOTE — Anesthesia Procedure Notes (Signed)
Date/Time: 05/26/2015 10:44 AM Performed by: Dionne Bucy Pre-anesthesia Checklist: Patient identified, Emergency Drugs available, Suction available and Patient being monitored Patient Re-evaluated:Patient Re-evaluated prior to inductionOxygen Delivery Method: Circle system utilized Preoxygenation: Pre-oxygenation with 100% oxygen Intubation Type: IV induction Ventilation: Mask ventilation without difficulty and Mask ventilation throughout procedure Airway Equipment and Method: Bite block Placement Confirmation: positive ETCO2 Dental Injury: Teeth and Oropharynx as per pre-operative assessment

## 2015-05-26 NOTE — Procedures (Signed)
ECT SERVICES Physician's Interval Evaluation & Treatment Note  Patient Identification: Wanda Ochoa MRN:  [id number] Date of Evaluation:  05/26/2015 TX #: 4  MADRS: 21 MMSE: 29  P.E. Findings:  No change to physical exam vital signs stable heart normal lungs clear  Psychiatric Interval Note:  Mood is improved anxiety is improved. Although her Mini-Mental Status exam is normal and her [place] has gone down only slightly the patient reports that she is feeling much better  Subjective:  Patient is a 70 y.o. female seen for evaluation for Electroconvulsive Therapy. Patient actually says she is feeling better it sounds like a lot of the concern was more rated as anxiety  Treatment Summary:   []   Right Unilateral             []  Bilateral   % Energy : 0.3 ms 75%   Impedance: 1260 ohms  Seizure Energy Index: 6857 V squared  Postictal Suppression Index: 72%  Seizure Concordance Index: 96%  Medications  Pre Shock: Toradol 30 mg, Brevital 80 mg, succinylcholine 80 mg  Post Shock: Labetalol 10 mg  Seizure Duration: 12 seconds by EMG, 44 seconds by EEG   Comments: Follow-up in 1 week and reassess   Lungs:  [x]   Clear to auscultation               []  Other:   Heart:    [x]   Regular rhythm             []  irregular rhythm    [x]   Previous H&P reviewed, patient examined and there are NO CHANGES                 []   Previous H&P reviewed, patient examined and there are changes noted.   [name], MD 3/3/201710:36 AM

## 2015-05-26 NOTE — Anesthesia Postprocedure Evaluation (Signed)
Anesthesia Post Note  Patient: Wanda Ochoa  Procedure(s) Performed: * No procedures listed *  Patient location during evaluation: PACU Anesthesia Type: General Level of consciousness: awake and alert Pain management: pain level controlled Vital Signs Assessment: post-procedure vital signs reviewed and stable Respiratory status: spontaneous breathing Cardiovascular status: blood pressure returned to baseline Postop Assessment: no headache Anesthetic complications: no    Last Vitals:  Filed Vitals:   05/26/15 1144 05/26/15 1152  BP: 159/73 141/73  Pulse: 61 67  Temp:    Resp: 11 16    Last Pain: There were no vitals filed for this visit.               Jandel Patriarca M

## 2015-05-26 NOTE — H&P (Signed)
Wanda Ochoa is an 70 y.o. female.   Chief Complaint: Patient has no new complaints today. Anxiety is much improved. Depression is improved. Physically feeling fine. No pain no physical complaints HPI: Patient with depression and anxiety symptoms that have been recurrent and resistant to medicine doing much better after a few ECT treatments  Past Medical History  Diagnosis Date  . Thyroid disease   . Asthma   . HTN (hypertension)   . Anxiety     Past Surgical History  Procedure Laterality Date  . Cesarean section    . Abdominal hysterectomy  1990  . Breast surgery  1967  . Bladder suspension  1990  . Hemorrhoidectomy with hemorrhoid banding  1988  . Breast biopsy Right 1967    EXCISIONAL BX - NEG  . Breast cyst aspiration Right 1985    Family History  Problem Relation Age of Onset  . Depression Father   . Suicidality Father   . Depression Sister   . Breast cancer Sister   . Bipolar disorder Son   . Allergies Brother   . Allergies Sister   . Asthma Maternal Uncle   . Heart disease Mother   . Colon cancer Mother   . Hypertension Mother   . Anxiety disorder Mother   . Heart disease Maternal Grandfather   . Heart disease Maternal Grandmother   . Breast cancer Paternal Grandmother    Social History:  reports that she has never smoked. She has never used smokeless tobacco. She reports that she does not drink alcohol or use illicit drugs.  Allergies:  Allergies  Allergen Reactions  . Penicillins Anaphylaxis     (Not in a hospital admission)  No results found for this or any previous visit (from the past 48 hour(s)). No results found.  Review of Systems  Constitutional: Negative.   HENT: Negative.   Eyes: Negative.   Respiratory: Negative.   Cardiovascular: Negative.   Gastrointestinal: Negative.   Musculoskeletal: Negative.   Skin: Negative.   Neurological: Negative.   Psychiatric/Behavioral: Negative for depression, suicidal ideas, hallucinations,  memory loss and substance abuse. The patient is not nervous/anxious and does not have insomnia.     Blood pressure 156/77, pulse 58, temperature 98.2 F (36.8 C), temperature source Tympanic, resp. rate 16, height 5\' 9"  (1.753 m), weight 82.101 kg (181 lb), SpO2 100 %. Physical Exam  Nursing note and vitals reviewed. Constitutional: She appears well-developed and well-nourished.  HENT:  Head: Normocephalic and atraumatic.  Eyes: Conjunctivae are normal. Pupils are equal, round, and reactive to light.  Neck: Normal range of motion.  Cardiovascular: Normal rate, regular rhythm and normal heart sounds.   Respiratory: Effort normal.  GI: Soft.  Musculoskeletal: Normal range of motion.  Neurological: She is alert.  Skin: Skin is warm and dry.  Psychiatric: She has a normal mood and affect. Her behavior is normal. Judgment and thought content normal.     Assessment/Plan We will see her back next Friday and then try and stretch maintenance out even farther or possibly discontinue  [name], MD 05/26/2015, 10:35 AM

## 2015-05-26 NOTE — Anesthesia Preprocedure Evaluation (Signed)
Anesthesia Evaluation  Patient identified by MRN, date of birth, ID band Patient awake    Reviewed: Allergy & Precautions, NPO status , [name] Chart, lab work & pertinent test results  Airway Mallampati: II  TM Distance: >3 FB Neck ROM: Limited    Dental  (+) Teeth Intact, Caps   Pulmonary shortness of breath and with exertion, asthma ,    Pulmonary exam normal        Cardiovascular Exercise Tolerance: Good hypertension, Pt. on medications and Pt. on home beta blockers Normal cardiovascular exam     Neuro/Psych Anxiety Depression    GI/Hepatic   Endo/Other  Hypothyroidism Treated.  Renal/GU      Musculoskeletal   Abdominal Normal abdominal exam  (+)   Peds  Hematology   Anesthesia Other Findings   Reproductive/Obstetrics                             Anesthesia Physical Anesthesia Plan  ASA: III  Anesthesia Plan: General   Post-op Pain Management:    Induction: Intravenous  Airway Management Planned: Mask  Additional Equipment:   Intra-op Plan:   Post-operative Plan:   Informed Consent: I have reviewed the patients History and Physical, chart, labs and discussed the procedure including the risks, benefits and alternatives for the proposed anesthesia with the patient or authorized representative who has indicated his/her understanding and acceptance.     Plan Discussed with:   Anesthesia Plan Comments:         Anesthesia Quick Evaluation

## 2015-05-26 NOTE — Transfer of Care (Signed)
Immediate Anesthesia Transfer of Care Note  Patient: Wanda Ochoa  Procedure(s) Performed: ECT  Patient Location: PACU  Anesthesia Type:General  Level of Consciousness: awake and patient cooperative  Airway & Oxygen Therapy: Patient Spontanous Breathing and Patient connected to face mask oxygen  Post-op Assessment: Report given to RN  Post vital signs: Reviewed and stable  Last Vitals:  Filed Vitals:   05/26/15 0842 05/26/15 1056  BP: 156/77 183/94  Pulse: 58 72  Temp: 36.8 C 98.4F  Resp: 16 18    Complications: No apparent anesthesia complications

## 2015-05-26 NOTE — Addendum Note (Signed)
Addendum  created 05/26/15 1208 by Elyse Hsu, MD   Modules edited: Clinical Notes   Clinical Notes:  File: [id number]

## 2015-05-29 DIAGNOSIS — I38 Endocarditis, valve unspecified: Secondary | ICD-10-CM | POA: Diagnosis not present

## 2015-05-29 DIAGNOSIS — I10 Essential (primary) hypertension: Secondary | ICD-10-CM | POA: Diagnosis not present

## 2015-05-30 ENCOUNTER — Telehealth (HOSPITAL_COMMUNITY): Payer: Self-pay | Admitting: *Deleted

## 2015-05-30 NOTE — Telephone Encounter (Signed)
Called [phone] spoke with Wanda Ochoa who states no prior authorization is required for outpatient ECT. Call reference [phone].

## 2015-06-02 ENCOUNTER — Encounter: Payer: Self-pay | Admitting: Anesthesiology

## 2015-06-02 ENCOUNTER — Encounter (HOSPITAL_BASED_OUTPATIENT_CLINIC_OR_DEPARTMENT_OTHER)
Admission: RE | Admit: 2015-06-02 | Discharge: 2015-06-02 | Disposition: A | Discharge status: Home / Self Care | Payer: Medicare Other | Source: Ambulatory Visit | Attending: Psychiatry | Admitting: Psychiatry

## 2015-06-02 DIAGNOSIS — F419 Anxiety disorder, unspecified: Secondary | ICD-10-CM | POA: Diagnosis not present

## 2015-06-02 DIAGNOSIS — Z880 Allergy status to penicillin: Secondary | ICD-10-CM | POA: Diagnosis not present

## 2015-06-02 DIAGNOSIS — F332 Major depressive disorder, recurrent severe without psychotic features: Secondary | ICD-10-CM

## 2015-06-02 DIAGNOSIS — E079 Disorder of thyroid, unspecified: Secondary | ICD-10-CM | POA: Diagnosis not present

## 2015-06-02 DIAGNOSIS — J45909 Unspecified asthma, uncomplicated: Secondary | ICD-10-CM | POA: Diagnosis not present

## 2015-06-02 DIAGNOSIS — I10 Essential (primary) hypertension: Secondary | ICD-10-CM | POA: Diagnosis not present

## 2015-06-02 MED ORDER — LABETALOL HCL 5 MG/ML IV SOLN
INTRAVENOUS | Status: DC | PRN
  Administered 2015-06-02: 10 mg via INTRAVENOUS

## 2015-06-02 MED ORDER — KETOROLAC TROMETHAMINE 30 MG/ML IJ SOLN
30.0000 mg | Freq: Once | INTRAMUSCULAR | Status: AC
  Administered 2015-06-02: 30 mg via INTRAVENOUS

## 2015-06-02 MED ORDER — SUCCINYLCHOLINE CHLORIDE 20 MG/ML IJ SOLN
INTRAMUSCULAR | Status: DC | PRN
  Administered 2015-06-02: 80 mg via INTRAVENOUS

## 2015-06-02 MED ORDER — SODIUM CHLORIDE 0.9 % IV SOLN
INTRAVENOUS | Status: DC | PRN
  Administered 2015-06-02: 11:00:00 via INTRAVENOUS

## 2015-06-02 MED ORDER — SODIUM CHLORIDE 0.9 % IV SOLN
250.0000 mL | Freq: Once | INTRAVENOUS | Status: AC
  Administered 2015-06-02: 500 mL via INTRAVENOUS

## 2015-06-02 MED ORDER — METHOHEXITAL SODIUM 100 MG/10ML IV SOSY
PREFILLED_SYRINGE | INTRAVENOUS | Status: DC | PRN
  Administered 2015-06-02: 80 mg via INTRAVENOUS

## 2015-06-02 NOTE — Transfer of Care (Signed)
Immediate Anesthesia Transfer of Care Note  Patient: Wanda Ochoa  Procedure(s) Performed: * No procedures listed *  Patient Location: PACU  Anesthesia Type:General  Level of Consciousness: awake  Airway & Oxygen Therapy: Patient Spontanous Breathing and Patient connected to face mask oxygen  Post-op Assessment: Report given to RN and Post -op Vital signs reviewed and stable  Post vital signs: Reviewed and stable  Last Vitals:  Filed Vitals:   06/02/15 0909  BP: 154/68  Pulse: 59  Temp: 36.9 C  Resp: 18    Complications: No apparent anesthesia complications

## 2015-06-02 NOTE — Procedures (Signed)
ECT SERVICES Physician's Interval Evaluation & Treatment Note  Patient Identification: Wanda Ochoa MRN:  [id number] Date of Evaluation:  06/02/2015 TX #: 5  MADRS:   MMSE:   P.E. Findings:  No change to physical exam. Systolic blood pressure up slightly. Heart and lungs normal.  Psychiatric Interval Note:  Mood is feeling much better anxiety under good control  Subjective:  Patient is a 70 y.o. female seen for evaluation for Electroconvulsive Therapy. No specific new complaints  Treatment Summary:   [x]   Right Unilateral             []  Bilateral   % Energy : 0.3 ms 75%   Impedance: 1250 ohms  Seizure Energy Index: 12,239 V squared  Postictal Suppression Index: 88%  Seizure Concordance Index: 98%  Medications  Pre Shock: Toradol 30 mg, labetalol 10 mg, Brevital 80 mg, succinylcholine 80 mg  Post Shock:    Seizure Duration: 26 seconds by EMG, 38 seconds by EEG   Comments: Patient will not have a follow-up scheduled at this time but we will see her back only as needed.   Lungs:  [x]   Clear to auscultation               []  Other:   Heart:    [x]   Regular rhythm             []  irregular rhythm    [x]   Previous H&P reviewed, patient examined and there are NO CHANGES                 []   Previous H&P reviewed, patient examined and there are changes noted.   [name], MD 3/10/201710:59 AM

## 2015-06-02 NOTE — Anesthesia Postprocedure Evaluation (Signed)
Anesthesia Post Note  Patient: Wanda Ochoa  Procedure(s) Performed: * No procedures listed *  Patient location during evaluation: PACU Anesthesia Type: General Level of consciousness: awake and alert Pain management: pain level controlled Vital Signs Assessment: post-procedure vital signs reviewed and stable Respiratory status: spontaneous breathing, nonlabored ventilation, respiratory function stable and patient connected to nasal cannula oxygen Cardiovascular status: blood pressure returned to baseline and stable Postop Assessment: no signs of nausea or vomiting Anesthetic complications: no    Last Vitals:  Filed Vitals:   06/02/15 1127 06/02/15 1135  BP: 171/81 168/77  Pulse:    Temp:    Resp:      Last Pain: There were no vitals filed for this visit.               Precious Haws Piscitello

## 2015-06-02 NOTE — H&P (Signed)
Wanda Ochoa is an 70 y.o. female.   Chief Complaint: Patient is feeling much better. No new complaint. Anxiety is essentially gone not feeling depressed. HPI: Patient with severe recurrent depression with anxiety symptoms that had not been responding medicine is doing much better after a short course of ECT  Past Medical History  Diagnosis Date  . Thyroid disease   . Asthma   . HTN (hypertension)   . Anxiety     Past Surgical History  Procedure Laterality Date  . Cesarean section    . Abdominal hysterectomy  1990  . Breast surgery  1967  . Bladder suspension  1990  . Hemorrhoidectomy with hemorrhoid banding  1988  . Breast biopsy Right 1967    EXCISIONAL BX - NEG  . Breast cyst aspiration Right 1985    Family History  Problem Relation Age of Onset  . Depression Father   . Suicidality Father   . Depression Sister   . Breast cancer Sister   . Bipolar disorder Son   . Allergies Brother   . Allergies Sister   . Asthma Maternal Uncle   . Heart disease Mother   . Colon cancer Mother   . Hypertension Mother   . Anxiety disorder Mother   . Heart disease Maternal Grandfather   . Heart disease Maternal Grandmother   . Breast cancer Paternal Grandmother    Social History:  reports that she has never smoked. She has never used smokeless tobacco. She reports that she does not drink alcohol or use illicit drugs.  Allergies:  Allergies  Allergen Reactions  . Penicillins Anaphylaxis     (Not in a hospital admission)  No results found for this or any previous visit (from the past 48 hour(s)). No results found.  Review of Systems  Constitutional: Negative.   HENT: Negative.   Eyes: Negative.   Respiratory: Negative.   Cardiovascular: Negative.   Gastrointestinal: Negative.   Musculoskeletal: Negative.   Skin: Negative.   Neurological: Negative.   Psychiatric/Behavioral: Negative for depression, suicidal ideas, hallucinations, memory loss and substance abuse. The  patient is not nervous/anxious and does not have insomnia.     Blood pressure 154/68, pulse 59, temperature 98.4 F (36.9 C), resp. rate 18, height 5\' 9"  (1.753 m), weight 80.74 kg (178 lb), SpO2 100 %. Physical Exam  Nursing note and vitals reviewed. Constitutional: She appears well-developed and well-nourished.  HENT:  Head: Normocephalic and atraumatic.  Eyes: Conjunctivae are normal. Pupils are equal, round, and reactive to light.  Neck: Normal range of motion.  Cardiovascular: Normal rate, regular rhythm and normal heart sounds.   Respiratory: Effort normal and breath sounds normal. No respiratory distress.  GI: Soft.  Musculoskeletal: Normal range of motion.  Neurological: She is alert.  Skin: Skin is warm and dry.  Psychiatric: She has a normal mood and affect. Her behavior is normal. Judgment and thought content normal.     Assessment/Plan Patient will receive her last maintenance treatment today and that after discussing the plan we have agreed that she will not be rescheduled but instead can keep in touch with [place] at her outpatient provider and come back as needed.  [name], MD 06/02/2015, 10:58 AM

## 2015-06-02 NOTE — Anesthesia Preprocedure Evaluation (Signed)
Anesthesia Evaluation  Patient identified by MRN, date of birth, ID band Patient awake    Reviewed: Allergy & Precautions, H&P , NPO status , [name] Chart, lab work & pertinent test results  History of Anesthesia Complications Negative for: history of anesthetic complications  Airway Mallampati: II  TM Distance: >3 FB Neck ROM: Limited    Dental  (+) Caps, Poor Dentition   Pulmonary shortness of breath and with exertion, asthma ,    Pulmonary exam normal        Cardiovascular Exercise Tolerance: Good hypertension, Pt. on medications and Pt. on home beta blockers Normal cardiovascular exam     Neuro/Psych PSYCHIATRIC DISORDERS Anxiety Depression  Neuromuscular disease    GI/Hepatic   Endo/Other  Hypothyroidism Treated.  Renal/GU      Musculoskeletal   Abdominal Normal abdominal exam  (+)   Peds  Hematology   Anesthesia Other Findings Past Medical History:   Thyroid disease                                              Asthma                                                       HTN (hypertension)                                           Anxiety                                                    Past Surgical History:   [place]                                   Right 1967  Comment:EXCISIONAL BX - NEG   BREAST CYST ASPIRATION                          Right 1985       Reproductive/Obstetrics                             Anesthesia Physical  Anesthesia Plan  ASA: III  Anesthesia Plan: General   Post-op Pain Management:    Induction: Intravenous  Airway  Management Planned: Mask  Additional Equipment:   Intra-op Plan:   Post-operative Plan:   Informed Consent: I have reviewed the patients History and Physical, chart, labs and discussed the procedure including the risks, benefits and alternatives for the proposed anesthesia with the patient or authorized representative who has indicated his/her understanding and acceptance.     Plan Discussed with: Anesthesiologist, CRNA and Surgeon  Anesthesia Plan Comments:         Anesthesia Quick Evaluation

## 2015-06-07 DIAGNOSIS — F331 Major depressive disorder, recurrent, moderate: Secondary | ICD-10-CM | POA: Diagnosis not present

## 2015-06-26 DIAGNOSIS — I10 Essential (primary) hypertension: Secondary | ICD-10-CM | POA: Diagnosis not present

## 2015-06-26 DIAGNOSIS — I38 Endocarditis, valve unspecified: Secondary | ICD-10-CM | POA: Diagnosis not present

## 2015-06-26 DIAGNOSIS — I341 Nonrheumatic mitral (valve) prolapse: Secondary | ICD-10-CM | POA: Diagnosis not present

## 2015-06-26 DIAGNOSIS — E782 Mixed hyperlipidemia: Secondary | ICD-10-CM | POA: Diagnosis not present

## 2015-07-10 DIAGNOSIS — F331 Major depressive disorder, recurrent, moderate: Secondary | ICD-10-CM | POA: Diagnosis not present

## 2015-07-24 DIAGNOSIS — E782 Mixed hyperlipidemia: Secondary | ICD-10-CM | POA: Diagnosis not present

## 2015-07-24 DIAGNOSIS — I341 Nonrheumatic mitral (valve) prolapse: Secondary | ICD-10-CM | POA: Diagnosis not present

## 2015-07-24 DIAGNOSIS — I38 Endocarditis, valve unspecified: Secondary | ICD-10-CM | POA: Diagnosis not present

## 2015-07-24 DIAGNOSIS — I10 Essential (primary) hypertension: Secondary | ICD-10-CM | POA: Diagnosis not present

## 2015-08-23 DIAGNOSIS — F331 Major depressive disorder, recurrent, moderate: Secondary | ICD-10-CM | POA: Diagnosis not present

## 2015-09-29 NOTE — Telephone Encounter (Signed)
error 

## 2015-10-16 DIAGNOSIS — F411 Generalized anxiety disorder: Secondary | ICD-10-CM | POA: Diagnosis not present

## 2015-10-16 DIAGNOSIS — F331 Major depressive disorder, recurrent, moderate: Secondary | ICD-10-CM | POA: Diagnosis not present

## 2015-11-22 DIAGNOSIS — F411 Generalized anxiety disorder: Secondary | ICD-10-CM | POA: Diagnosis not present

## 2015-11-22 DIAGNOSIS — F331 Major depressive disorder, recurrent, moderate: Secondary | ICD-10-CM | POA: Diagnosis not present

## 2015-12-11 ENCOUNTER — Ambulatory Visit (INDEPENDENT_AMBULATORY_CARE_PROVIDER_SITE_OTHER): Payer: Medicare Other | Admitting: Family Medicine

## 2015-12-11 ENCOUNTER — Telehealth: Payer: Self-pay | Admitting: Family Medicine

## 2015-12-11 ENCOUNTER — Encounter: Payer: Self-pay | Admitting: Family Medicine

## 2015-12-11 VITALS — BP 110/60 | HR 71 | Temp 98.1°F | Resp 16 | Wt 195.0 lb

## 2015-12-11 DIAGNOSIS — J069 Acute upper respiratory infection, unspecified: Secondary | ICD-10-CM | POA: Diagnosis not present

## 2015-12-11 DIAGNOSIS — J45909 Unspecified asthma, uncomplicated: Secondary | ICD-10-CM | POA: Diagnosis not present

## 2015-12-11 MED ORDER — ALBUTEROL SULFATE HFA 108 (90 BASE) MCG/ACT IN AERS: 2.0000 | INHALATION_SPRAY | Freq: Four times a day (QID) | RESPIRATORY_TRACT | 5 refills | Status: DC | PRN

## 2015-12-11 NOTE — Telephone Encounter (Signed)
Please review. Emily Drozdowski, CMA  

## 2015-12-11 NOTE — Telephone Encounter (Signed)
Pt states she seen Dr Caryn Section today and he was calling in an inhaler to [place] states she pharmacy has not rec'd this Rx.  [id number]

## 2015-12-11 NOTE — Progress Notes (Signed)
Patient: Wanda Ochoa Female    DOB: [birth year]   70 y.o.   MRN: [id number] Visit Date: 12/11/2015  Today's Provider: Lelon Huh, MD   Chief Complaint  Patient presents with  . Cough    x 3 days   Subjective:    Cough  This is a new problem. Episode onset: 3 days ago. The problem has been unchanged. The problem occurs constantly. The cough is non-productive. Associated symptoms include postnasal drip, rhinorrhea (improved), a sore throat (resolved), shortness of breath (resolved since using inhaler) and wheezing. Pertinent negatives include no chest pain, chills, ear congestion, ear pain, fever, headaches, heartburn, hemoptysis, myalgias, nasal congestion, rash or sweats. Treatments tried: Albuterol inhaler. The treatment provided mild relief.       Allergies  Allergen Reactions  . Penicillins Anaphylaxis     Current Outpatient Prescriptions:  .  albuterol (PROAIR HFA) 108 (90 BASE) MCG/ACT inhaler, Inhale 2 puffs into the lungs every 6 (six) hours as needed., Disp: 18 g, Rfl: 5 .  amLODipine (NORVASC) 5 MG tablet, Take 5 mg by mouth 2 (two) times daily. , Disp: , Rfl:  .  aspirin 81 MG tablet, Take 81 mg by mouth daily., Disp: , Rfl:  .  KRILL OIL PO, Take 1 capsule by mouth daily. Reported on 06/02/2015, Disp: , Rfl:  .  levothyroxine (SYNTHROID, LEVOTHROID) 112 MCG tablet, Take 1 tablet (112 mcg total) by mouth daily., Disp: 90 tablet, Rfl: 3 .  lisinopril (PRINIVIL,ZESTRIL) 20 MG tablet, Take 20 mg by mouth daily. , Disp: , Rfl:  .  metoprolol tartrate (LOPRESSOR) 25 MG tablet, Take 25 mg by mouth 2 (two) times daily., Disp: , Rfl:  .  Multiple Vitamin (MULTIVITAMIN) capsule, Take 1 capsule by mouth daily., Disp: , Rfl:  .  nortriptyline (PAMELOR) 25 MG capsule, Take 4 capsule at bed time (Patient taking differently: Take 75 mg by mouth at bedtime. Take 4 capsule at bed time), Disp: 120 capsule, Rfl: 0 .  Omega 3-6-9 CAPS, Take by mouth., Disp: , Rfl:  .   QUEtiapine (SEROQUEL) 25 MG tablet, Take 25 mg by mouth at bedtime. , Disp: , Rfl:   Review of Systems  Constitutional: Negative for appetite change, chills, fatigue and fever.  HENT: Positive for postnasal drip, rhinorrhea (improved) and sore throat (resolved). Negative for ear pain.   Respiratory: Positive for cough, shortness of breath (resolved since using inhaler) and wheezing. Negative for hemoptysis and chest tightness.   Cardiovascular: Negative for chest pain and palpitations.  Gastrointestinal: Negative for abdominal pain, heartburn, nausea and vomiting.  Musculoskeletal: Negative for myalgias.  Skin: Negative for rash.  Neurological: Positive for weakness. Negative for dizziness and headaches.    Social History  Substance Use Topics  . Smoking status: Never Smoker  . Smokeless tobacco: Never Used  . Alcohol use No   Objective:   BP 110/60 (BP Location: Left Arm, Patient Position: Sitting, Cuff Size: Large)   Pulse 71   Temp 98.1 F (36.7 C) (Oral)   Resp 16   Wt 195 lb (88.5 kg)   SpO2 94% Comment: room air  BMI 28.80 kg/m   Physical Exam  General Appearance:    Alert, cooperative, no distress  HENT:   bilateral TM normal without fluid or infection, neck without nodes, throat normal without erythema or exudate, sinuses nontender, post nasal drip noted and nasal mucosa congested  Eyes:    PERRL, conjunctiva/corneas clear, EOM's intact  Lungs:     Clear to auscultation bilaterally, respirations unlabored  Heart:    Regular rate and rhythm  Neurologic:   Awake, alert, oriented x 3. No apparent focal neurological           defect.           Assessment & Plan:     1. Upper respiratory infection   2. Asthma, exogenous, unspecified asthma severity, uncomplicated  - albuterol (PROAIR HFA) 108 (90 Base) MCG/ACT inhaler; Inhale 2 puffs into the lungs every 6 (six) hours as needed.  Dispense: 18 g; Refill: 5       Lelon Huh, MD  [facility]

## 2015-12-11 NOTE — Telephone Encounter (Signed)
Recent prescription. Please call [facility] to make sure they got prescription.

## 2016-01-22 DIAGNOSIS — F331 Major depressive disorder, recurrent, moderate: Secondary | ICD-10-CM | POA: Diagnosis not present

## 2016-01-22 DIAGNOSIS — F411 Generalized anxiety disorder: Secondary | ICD-10-CM | POA: Diagnosis not present

## 2016-02-01 DIAGNOSIS — I38 Endocarditis, valve unspecified: Secondary | ICD-10-CM | POA: Diagnosis not present

## 2016-02-01 DIAGNOSIS — I341 Nonrheumatic mitral (valve) prolapse: Secondary | ICD-10-CM | POA: Diagnosis not present

## 2016-02-01 DIAGNOSIS — E782 Mixed hyperlipidemia: Secondary | ICD-10-CM | POA: Diagnosis not present

## 2016-02-01 DIAGNOSIS — I10 Essential (primary) hypertension: Secondary | ICD-10-CM | POA: Diagnosis not present

## 2016-02-02 ENCOUNTER — Other Ambulatory Visit: Payer: Self-pay | Admitting: Family Medicine

## 2016-02-02 DIAGNOSIS — I10 Essential (primary) hypertension: Secondary | ICD-10-CM

## 2016-02-02 DIAGNOSIS — E039 Hypothyroidism, unspecified: Secondary | ICD-10-CM

## 2016-02-02 NOTE — Telephone Encounter (Signed)
Please review-aa 

## 2016-02-06 ENCOUNTER — Telehealth: Payer: Self-pay | Admitting: Psychiatry

## 2016-02-06 NOTE — Telephone Encounter (Signed)
Spoke to patient on the phone today to arrange setting up ECT treatments probably starting Friday. I will get back to her about whether we need to do any new testing tomorrow.

## 2016-02-08 ENCOUNTER — Other Ambulatory Visit: Payer: Self-pay | Admitting: Psychiatry

## 2016-02-09 ENCOUNTER — Encounter
Admission: RE | Admit: 2016-02-09 | Discharge: 2016-02-09 | Disposition: A | Discharge status: Home / Self Care | Payer: Medicare Other | Source: Ambulatory Visit | Attending: Psychiatry | Admitting: Psychiatry

## 2016-02-09 ENCOUNTER — Encounter: Payer: Self-pay | Admitting: Anesthesiology

## 2016-02-09 ENCOUNTER — Other Ambulatory Visit: Payer: Self-pay

## 2016-02-09 DIAGNOSIS — F329 Major depressive disorder, single episode, unspecified: Secondary | ICD-10-CM | POA: Insufficient documentation

## 2016-02-09 DIAGNOSIS — F419 Anxiety disorder, unspecified: Secondary | ICD-10-CM | POA: Insufficient documentation

## 2016-02-09 DIAGNOSIS — Z818 Family history of other mental and behavioral disorders: Secondary | ICD-10-CM | POA: Diagnosis not present

## 2016-02-09 DIAGNOSIS — Z5189 Encounter for other specified aftercare: Secondary | ICD-10-CM | POA: Diagnosis not present

## 2016-02-09 DIAGNOSIS — Z9889 Other specified postprocedural states: Secondary | ICD-10-CM | POA: Diagnosis not present

## 2016-02-09 DIAGNOSIS — F332 Major depressive disorder, recurrent severe without psychotic features: Secondary | ICD-10-CM

## 2016-02-09 DIAGNOSIS — Z01810 Encounter for preprocedural cardiovascular examination: Secondary | ICD-10-CM | POA: Diagnosis not present

## 2016-02-09 MED ORDER — SUCCINYLCHOLINE CHLORIDE 200 MG/10ML IV SOSY
PREFILLED_SYRINGE | INTRAVENOUS | Status: DC | PRN
  Administered 2016-02-09: 80 mg via INTRAVENOUS

## 2016-02-09 MED ORDER — KETOROLAC TROMETHAMINE 30 MG/ML IJ SOLN
30.0000 mg | Freq: Once | INTRAMUSCULAR | Status: AC
  Administered 2016-02-09: 30 mg via INTRAVENOUS

## 2016-02-09 MED ORDER — SODIUM CHLORIDE 0.9 % IV SOLN
500.0000 mL | Freq: Once | INTRAVENOUS | Status: AC
  Administered 2016-02-09: 500 mL via INTRAVENOUS

## 2016-02-09 MED ORDER — KETOROLAC TROMETHAMINE 30 MG/ML IJ SOLN
INTRAMUSCULAR | Status: AC
  Administered 2016-02-09: 30 mg via INTRAVENOUS
  Filled 2016-02-09: qty 1

## 2016-02-09 MED ORDER — SODIUM CHLORIDE 0.9 % IV SOLN
INTRAVENOUS | Status: DC | PRN
  Administered 2016-02-09: 11:00:00 via INTRAVENOUS

## 2016-02-09 MED ORDER — LABETALOL HCL 5 MG/ML IV SOLN
INTRAVENOUS | Status: DC | PRN
  Administered 2016-02-09: 10 mg via INTRAVENOUS

## 2016-02-09 MED ORDER — METHOHEXITAL SODIUM 100 MG/10ML IV SOSY
PREFILLED_SYRINGE | INTRAVENOUS | Status: DC | PRN
  Administered 2016-02-09: 80 mg via INTRAVENOUS

## 2016-02-09 NOTE — H&P (Signed)
Wanda Ochoa is an 70 y.o. female.   Chief Complaint: Patient is feeling more anxious and depressed. Starting to ruminate and get obsessive which in the past has been a clear warning sign of worsening depression. Previously this has led to catatonia. She and her outpatient psychiatrist agree as do I that ECT is warranted given her very positive previous response to treatment. HPI: History of chronic anxiety and depression. She has had 2 previous episodes of ECT in the past which have been very successful in improving acute symptoms. They risk of without this is a decline into catatonia which has happened in the past. We are hoping that this will avoid hospitalization and worsening disability. Patient is very agreeable to the treatment.  Past Medical History:  Diagnosis Date  . Anxiety   . Asthma   . HTN (hypertension)   . Thyroid disease     Past Surgical History:  Procedure Laterality Date  . ABDOMINAL HYSTERECTOMY  1990  . BLADDER SUSPENSION  1990  . BREAST BIOPSY Right 1967   EXCISIONAL BX - NEG  . BREAST CYST ASPIRATION Right 1985  . BREAST SURGERY  1967  . CESAREAN SECTION    . HEMORRHOIDECTOMY WITH HEMORRHOID BANDING  1988    Family History  Problem Relation Age of Onset  . Depression Father   . Suicidality Father   . Depression Sister   . Breast cancer Sister   . Bipolar disorder Son   . Heart disease Mother   . Colon cancer Mother   . Hypertension Mother   . Anxiety disorder Mother   . Allergies Brother   . Allergies Sister   . Asthma Maternal Uncle   . Heart disease Maternal Grandfather   . Heart disease Maternal Grandmother   . Breast cancer Paternal Grandmother    Social History:  reports that she has never smoked. She has never used smokeless tobacco. She reports that she does not drink alcohol or use drugs.  Allergies:  Allergies  Allergen Reactions  . Penicillins Anaphylaxis     (Not in a hospital admission)  No results found for this or any  previous visit (from the past 48 hour(s)). No results found.  Review of Systems  Constitutional: Negative.   HENT: Negative.   Eyes: Negative.   Respiratory: Negative.   Cardiovascular: Negative.   Gastrointestinal: Negative.   Musculoskeletal: Negative.   Skin: Negative.   Neurological: Negative.   Psychiatric/Behavioral: Positive for depression. Negative for hallucinations, memory loss, substance abuse and suicidal ideas. The patient is nervous/anxious. The patient does not have insomnia.     Blood pressure (!) 169/73, pulse 79, temperature 97.6 F (36.4 C), temperature source Oral, resp. rate 17, height 5\' 9"  (1.753 m), weight 84.4 kg (186 lb), SpO2 99 %. Physical Exam  Nursing note and vitals reviewed. Constitutional: She appears well-developed and well-nourished.  HENT:  Head: Normocephalic and atraumatic.  Eyes: Conjunctivae are normal. Pupils are equal, round, and reactive to light.  Neck: Normal range of motion.  Cardiovascular: Normal heart sounds.   Respiratory: Effort normal.  GI: Soft.  Musculoskeletal: Normal range of motion.  Neurological: She is alert.  Skin: Skin is warm and dry.  Psychiatric: Her speech is normal. Judgment normal. Her mood appears anxious. She is slowed. Thought content is not paranoid. She exhibits a depressed mood. She expresses no homicidal and no suicidal ideation.     Assessment/Plan Treatment today and we are also scheduling treatment for Monday. Previously, 2-3 treatments have been  effective. I anticipate that we may need to only do a second treatment this time but we will reassess afterwards.  [name], MD 02/09/2016, 10:33 AM

## 2016-02-09 NOTE — Anesthesia Preprocedure Evaluation (Signed)
Anesthesia Evaluation  Patient identified by MRN, date of birth, ID band Patient awake    Reviewed: Allergy & Precautions, H&P , NPO status , [name] Chart, lab work & pertinent test results  History of Anesthesia Complications Negative for: history of anesthetic complications  Airway Mallampati: II  TM Distance: >3 FB Neck ROM: Limited    Dental  (+) Caps, Poor Dentition   Pulmonary shortness of breath and with exertion, asthma ,    Pulmonary exam normal        Cardiovascular Exercise Tolerance: Good hypertension, Pt. on medications and Pt. on home beta blockers Normal cardiovascular exam     Neuro/Psych PSYCHIATRIC DISORDERS  Neuromuscular disease    GI/Hepatic   Endo/Other  Hypothyroidism Treated.  Renal/GU      Musculoskeletal   Abdominal Normal abdominal exam  (+)   Peds  Hematology   Anesthesia Other Findings Past Medical History:   Thyroid disease                                              Asthma                                                       HTN (hypertension)                                           Anxiety                                                    Past Surgical History:   [place]                                   Right 1967  Comment:EXCISIONAL BX - NEG   BREAST CYST ASPIRATION                          Right 1985       Reproductive/Obstetrics                             Anesthesia Physical  Anesthesia Plan  ASA: III  Anesthesia Plan: General   Post-op Pain Management:    Induction: Intravenous  Airway Management Planned:  Mask  Additional Equipment:   Intra-op Plan:   Post-operative Plan:   Informed Consent: I have reviewed the patients History and Physical, chart, labs and discussed the procedure including the risks, benefits and alternatives for the proposed anesthesia with the patient or authorized representative who has indicated his/her understanding and acceptance.     Plan Discussed with: Anesthesiologist, CRNA and Surgeon  Anesthesia Plan Comments:         Anesthesia Quick Evaluation

## 2016-02-09 NOTE — Transfer of Care (Addendum)
Immediate Anesthesia Transfer of Care Note  Patient: Wanda Ochoa  Procedure(s) Performed: ECT  Patient Location: PACU  Anesthesia Type:General  Level of Consciousness: sedated  Airway & Oxygen Therapy: Patient Spontanous Breathing and Patient connected to face mask oxygen  Post-op Assessment: Report given to RN and Post -op Vital signs reviewed and stable  Post vital signs: Reviewed and stable  Last Vitals:  Vitals:   02/09/16 0826  BP: (!) 169/73  Pulse: 79  Resp: 17  Temp: 36.4 C    Last Pain:  Vitals:   02/09/16 0826  TempSrc: Oral      Patients Stated Pain Goal: 0 ([phone] [id number])  Complications: No apparent anesthesia complications

## 2016-02-09 NOTE — Discharge Instructions (Signed)
1)  The drugs that you have been given will stay in your system until tomorrow so for the       next 24 hours you should not:  A. Drive an automobile  B. Make any legal decisions  C. Drink any alcoholic beverages  2)  You may resume your regular meals upon return home.  3)  A responsible adult must take you home.  Someone should stay with you for a few          hours, then be available by phone for the remainder of the treatment day.  4)  You May experience any of the following symptoms:  Headache, Nausea and a dry mouth (due to the medications you were given),  temporary memory loss and some confusion, or sore muscles (a warm bath  should help this).  If you you experience any of these symptoms let us know on                your return visit.  5)  Report any of the following: any acute discomfort, severe headache, or temperature        greater than 100.5 F.   Also report any unusual redness, swelling, drainage, or pain         at your IV site.    You may report Symptoms to:  [place] [facility]          Phone: [phone], ECT Department           or Dr. Prescott Gum office [phone]  6)  Your next ECT Treatment is Day Monday  Date  November 20,2017  We will call 2 days prior to your scheduled appointment for arrival times.  7)  Nothing to eat or drink after midnight the night before your procedure.  8)  Take Metoprolol     With a sip of water the morning of your procedure.  9)  Other Instructions: Call [phone] to cancel the morning of your procedure due         to illness or emergency.  10) We will call within 72 hours to assess how you are feeling.

## 2016-02-09 NOTE — Anesthesia Procedure Notes (Signed)
Date/Time: 02/09/2016 10:42 AM Performed by: Dionne Bucy Pre-anesthesia Checklist: Patient identified, Emergency Drugs available, Suction available and Patient being monitored Patient Re-evaluated:Patient Re-evaluated prior to inductionOxygen Delivery Method: Circle system utilized Preoxygenation: Pre-oxygenation with 100% oxygen Intubation Type: IV induction Ventilation: Mask ventilation without difficulty and Mask ventilation throughout procedure Airway Equipment and Method: Bite block Placement Confirmation: positive ETCO2 Dental Injury: Teeth and Oropharynx as per pre-operative assessment

## 2016-02-09 NOTE — Procedures (Signed)
ECT SERVICES Physician's Interval Evaluation & Treatment Note  Patient Identification: SHADE HARDING MRN:  [id number] Date of Evaluation:  02/09/2016 TX #: 1 in this series  MADRS: 15  MMSE: 30  P.E. Findings:  Blood pressure slightly elevated. EKG entirely normal. No other findings. Lungs clear.  Psychiatric Interval Note:  Affect slightly anxious. Mood is feeling much more obsessive and ruminative and anxious. No suicidal ideation no sign of psychosis  Subjective:  Patient is a 70 y.o. female seen for evaluation for Electroconvulsive Therapy. Patient is clear that her symptoms are worsening and progressing in a way that in the past has been a prelude to catatonia  Treatment Summary:   [x]   Right Unilateral             []  Bilateral   % Energy : 0.3 ms 75%   Impedance: 1070 ohms  Seizure Energy Index: 8610 V squared  Postictal Suppression Index: 61%  Seizure Concordance Index: 95%  Medications  Pre Shock: Toradol 30 mg, labetalol 10 mg, Brevital 80 mg, succinylcholine 80 mg  Post Shock:    Seizure Duration: 19 seconds by EMG 59 seconds by EEG   Comments: Follow-up Monday   Lungs:  [x]   Clear to auscultation               []  Other:   Heart:    [x]   Regular rhythm             []  irregular rhythm    [x]   Previous H&P reviewed, patient examined and there are NO CHANGES                 []   Previous H&P reviewed, patient examined and there are changes noted.   [name], MD 11/17/201710:35 AM

## 2016-02-12 ENCOUNTER — Encounter: Payer: Self-pay | Admitting: Certified Registered Nurse Anesthetist

## 2016-02-12 ENCOUNTER — Encounter (HOSPITAL_BASED_OUTPATIENT_CLINIC_OR_DEPARTMENT_OTHER)
Admission: RE | Admit: 2016-02-12 | Discharge: 2016-02-12 | Disposition: A | Discharge status: Home / Self Care | Payer: Medicare Other | Source: Ambulatory Visit | Attending: Psychiatry | Admitting: Psychiatry

## 2016-02-12 DIAGNOSIS — Z5189 Encounter for other specified aftercare: Secondary | ICD-10-CM | POA: Diagnosis not present

## 2016-02-12 DIAGNOSIS — I10 Essential (primary) hypertension: Secondary | ICD-10-CM | POA: Diagnosis not present

## 2016-02-12 DIAGNOSIS — F332 Major depressive disorder, recurrent severe without psychotic features: Secondary | ICD-10-CM | POA: Diagnosis not present

## 2016-02-12 DIAGNOSIS — F338 Other recurrent depressive disorders: Secondary | ICD-10-CM | POA: Diagnosis not present

## 2016-02-12 DIAGNOSIS — Z9889 Other specified postprocedural states: Secondary | ICD-10-CM | POA: Diagnosis not present

## 2016-02-12 DIAGNOSIS — F419 Anxiety disorder, unspecified: Secondary | ICD-10-CM | POA: Diagnosis not present

## 2016-02-12 DIAGNOSIS — F329 Major depressive disorder, single episode, unspecified: Secondary | ICD-10-CM | POA: Diagnosis not present

## 2016-02-12 DIAGNOSIS — Z818 Family history of other mental and behavioral disorders: Secondary | ICD-10-CM | POA: Diagnosis not present

## 2016-02-12 DIAGNOSIS — J45909 Unspecified asthma, uncomplicated: Secondary | ICD-10-CM | POA: Diagnosis not present

## 2016-02-12 MED ORDER — METHOHEXITAL SODIUM 100 MG/10ML IV SOSY
PREFILLED_SYRINGE | INTRAVENOUS | Status: DC | PRN
  Administered 2016-02-12: 80 mg via INTRAVENOUS

## 2016-02-12 MED ORDER — SUCCINYLCHOLINE CHLORIDE 20 MG/ML IJ SOLN
INTRAMUSCULAR | Status: DC | PRN
  Administered 2016-02-12: 80 mg via INTRAVENOUS

## 2016-02-12 MED ORDER — LABETALOL HCL 5 MG/ML IV SOLN
INTRAVENOUS | Status: DC | PRN
  Administered 2016-02-12: 10 mg via INTRAVENOUS

## 2016-02-12 MED ORDER — SODIUM CHLORIDE 0.9 % IV SOLN
500.0000 mL | Freq: Once | INTRAVENOUS | Status: AC
  Administered 2016-02-12: 500 mL via INTRAVENOUS

## 2016-02-12 MED ORDER — KETOROLAC TROMETHAMINE 30 MG/ML IJ SOLN
INTRAMUSCULAR | Status: AC
  Filled 2016-02-12: qty 1

## 2016-02-12 MED ORDER — KETOROLAC TROMETHAMINE 30 MG/ML IJ SOLN
30.0000 mg | Freq: Once | INTRAMUSCULAR | Status: AC
  Administered 2016-02-12: 30 mg via INTRAVENOUS

## 2016-02-12 MED ORDER — SODIUM CHLORIDE 0.9 % IV SOLN
INTRAVENOUS | Status: DC | PRN
  Administered 2016-02-12: 10:00:00 via INTRAVENOUS

## 2016-02-12 NOTE — Procedures (Signed)
ECT SERVICES Physician's Interval Evaluation & Treatment Note  Patient Identification: Wanda Ochoa MRN:  [id number] Date of Evaluation:  02/12/2016 TX #: [id number]  MADRS:   MMSE:   P.E. Findings:  No change to physical exam  Psychiatric Interval Note:  Mood improved  Subjective:  Patient is a 70 y.o. female seen for evaluation for Electroconvulsive Therapy. Feeling better  Treatment Summary:   [x]   Right Unilateral             []  Bilateral   % Energy : 0.3 ms 75%   Impedance: 1610 ohms  Seizure Energy Index: Computer made no reading  Postictal Suppression Index: Computer made no reading  Seizure Concordance Index: Computer made no reading  Medications  Pre Shock: Toradol 30 mg labetalol 10 mg Brevital 80 mg succinylcholine 80 mg  Post Shock:    Seizure Duration: 17 seconds by EMG 46 seconds by EEG   Comments: Computer made a poor calculation for some unknown reason because the seizure was very clear-cut. After today's treatment we will stop this brief course and the patient will follow up with her regular outpatient psychiatrist who can be in touch with me if further treatments are necessary   Lungs:  [x]   Clear to auscultation               []  Other:   Heart:    [x]   Regular rhythm             []  irregular rhythm    [x]   Previous H&P reviewed, patient examined and there are NO CHANGES                 []   Previous H&P reviewed, patient examined and there are changes noted.   [name], MD 11/20/201710:10 AM

## 2016-02-12 NOTE — H&P (Signed)
Wanda Ochoa is an 70 y.o. female.   Chief Complaint: Mood is feeling better. Less anxious less depressed HPI: History of recurrent depression with anxiety symptoms responding well to brief return of maintenance ECT  Past Medical History:  Diagnosis Date  . Anxiety   . Asthma   . HTN (hypertension)   . Thyroid disease     Past Surgical History:  Procedure Laterality Date  . ABDOMINAL HYSTERECTOMY  1990  . BLADDER SUSPENSION  1990  . BREAST BIOPSY Right 1967   EXCISIONAL BX - NEG  . BREAST CYST ASPIRATION Right 1985  . BREAST SURGERY  1967  . CESAREAN SECTION    . HEMORRHOIDECTOMY WITH HEMORRHOID BANDING  1988    Family History  Problem Relation Age of Onset  . Depression Father   . Suicidality Father   . Depression Sister   . Breast cancer Sister   . Bipolar disorder Son   . Heart disease Mother   . Colon cancer Mother   . Hypertension Mother   . Anxiety disorder Mother   . Allergies Brother   . Allergies Sister   . Asthma Maternal Uncle   . Heart disease Maternal Grandfather   . Heart disease Maternal Grandmother   . Breast cancer Paternal Grandmother    Social History:  reports that she has never smoked. She has never used smokeless tobacco. She reports that she does not drink alcohol or use drugs.  Allergies:  Allergies  Allergen Reactions  . Penicillins Anaphylaxis     (Not in a hospital admission)  No results found for this or any previous visit (from the past 48 hour(s)). No results found.  Review of Systems  Constitutional: Negative.   HENT: Negative.   Eyes: Negative.   Respiratory: Negative.   Cardiovascular: Negative.   Gastrointestinal: Negative.   Musculoskeletal: Positive for myalgias.  Skin: Negative.   Neurological: Negative.   Psychiatric/Behavioral: Negative for depression, hallucinations, memory loss, substance abuse and suicidal ideas. The patient is nervous/anxious. The patient does not have insomnia.     Blood pressure  (!) 136/91, pulse 64, temperature 97.8 F (36.6 C), temperature source Oral, resp. rate 18, height 5\' 9"  (1.753 m), weight 84.8 kg (187 lb), SpO2 100 %. Physical Exam  Nursing note and vitals reviewed. Constitutional: She appears well-developed and well-nourished.  HENT:  Head: Normocephalic and atraumatic.  Eyes: Conjunctivae are normal. Pupils are equal, round, and reactive to light.  Neck: Normal range of motion.  Cardiovascular: Regular rhythm and normal heart sounds.   Respiratory: Effort normal. No respiratory distress.  GI: Soft.  Musculoskeletal: Normal range of motion.  Neurological: She is alert.  Skin: Skin is warm and dry.  Psychiatric: She has a normal mood and affect. Her behavior is normal. Judgment and thought content normal.     Assessment/Plan Treatment today and then we will discontinue this brief series. Patient will go through her regular outpatient psychiatrist for further treatment and we can be in contact if needed for follow-up treatment in the future  [name], MD 02/12/2016, 10:08 AM

## 2016-02-12 NOTE — Anesthesia Preprocedure Evaluation (Signed)
Anesthesia Evaluation  Patient identified by MRN, date of birth, ID band Patient awake    Reviewed: Allergy & Precautions, H&P , NPO status , [name] Chart, lab work & pertinent test results, reviewed documented beta blocker date and time   Airway Mallampati: II   Neck ROM: full    Dental  (+) Poor Dentition   Pulmonary neg pulmonary ROS, shortness of breath and with exertion, asthma ,    Pulmonary exam normal        Cardiovascular hypertension, negative cardio ROS Normal cardiovascular exam     Neuro/Psych PSYCHIATRIC DISORDERS  Neuromuscular disease negative neurological ROS  negative psych ROS   GI/Hepatic negative GI ROS, Neg liver ROS,   Endo/Other  negative endocrine ROSHypothyroidism   Renal/GU negative Renal ROS  negative genitourinary   Musculoskeletal   Abdominal   Peds  Hematology negative hematology ROS (+)   Anesthesia Other Findings Past Medical History: No date: Anxiety No date: Asthma No date: HTN (hypertension) No date: Thyroid disease Past Surgical History: 1990: ABDOMINAL HYSTERECTOMY 1990: BLADDER SUSPENSION 1967: BREAST BIOPSY Right     Comment: EXCISIONAL BX - NEG 1985: BREAST CYST ASPIRATION Right 1967: BREAST SURGERY No date: CESAREAN SECTION 1988: HEMORRHOIDECTOMY WITH HEMORRHOID BANDING BMI    Body Mass Index:  27.62 kg/m     Reproductive/Obstetrics                             Anesthesia Physical Anesthesia Plan  ASA: III  Anesthesia Plan: General   Post-op Pain Management:    Induction:   Airway Management Planned:   Additional Equipment:   Intra-op Plan:   Post-operative Plan:   Informed Consent: I have reviewed the patients History and Physical, chart, labs and discussed the procedure including the risks, benefits and alternatives for the proposed anesthesia with the patient or authorized representative who has indicated his/her  understanding and acceptance.   Dental Advisory Given  Plan Discussed with: CRNA  Anesthesia Plan Comments:         Anesthesia Quick Evaluation

## 2016-02-12 NOTE — Transfer of Care (Signed)
Immediate Anesthesia Transfer of Care Note  Patient: Wanda Ochoa  Procedure(s) Performed: * No procedures listed *  Patient Location: PACU  Anesthesia Type:General  Level of Consciousness: awake and alert   Airway & Oxygen Therapy: Patient Spontanous Breathing and Patient connected to face mask oxygen  Post-op Assessment: Report given to RN and Post -op Vital signs reviewed and stable  Post vital signs: Reviewed and stable  Last Vitals:  Vitals:   02/12/16 0830 02/12/16 1024  BP: (!) 136/91   Pulse: 64   Resp: 18 (P) 20  Temp: 36.6 C (P) 36.8 C    Last Pain:  Vitals:   02/12/16 0832  TempSrc:   PainSc: 0-No pain      Patients Stated Pain Goal: 0 ([id number])  Complications: None

## 2016-02-12 NOTE — Anesthesia Postprocedure Evaluation (Signed)
Anesthesia Post Note  Patient: Wanda Ochoa  Procedure(s) Performed: * No procedures listed *  Patient location during evaluation: PACU Anesthesia Type: General Level of consciousness: awake and alert Pain management: pain level controlled Vital Signs Assessment: post-procedure vital signs reviewed and stable Respiratory status: spontaneous breathing, nonlabored ventilation, respiratory function stable and patient connected to nasal cannula oxygen Cardiovascular status: blood pressure returned to baseline and stable Postop Assessment: no signs of nausea or vomiting Anesthetic complications: no    Last Vitals:  Vitals:   02/12/16 1054 02/12/16 1104  BP: (!) 151/73 (!) 149/76  Pulse: 67 63  Resp: 19 18  Temp: 37.3 C     Last Pain:  Vitals:   02/12/16 1024  TempSrc:   PainSc: [place] Andre Swander

## 2016-02-12 NOTE — Discharge Instructions (Signed)
1)  The drugs that you have been given will stay in your system until tomorrow so for the       next 24 hours you should not:  A. Drive an automobile  B. Make any legal decisions  C. Drink any alcoholic beverages  2)  You may resume your regular meals upon return home.  3)  A responsible adult must take you home.  Someone should stay with you for a few          hours, then be available by phone for the remainder of the treatment day.  4)  You May experience any of the following symptoms:  Headache, Nausea and a dry mouth (due to the medications you were given),  temporary memory loss and some confusion, or sore muscles (a warm bath  should help this).  If you you experience any of these symptoms let us know on                your return visit.  5)  Report any of the following: any acute discomfort, severe headache, or temperature        greater than 100.5 F.   Also report any unusual redness, swelling, drainage, or pain         at your IV site.    You may report Symptoms to:  [place] [facility]          Phone: [phone], ECT Department           or Dr. Prescott Gum office [phone]  6)  Your next ECT Treatment is for pt to call when she feels she needs another treatment.  We will call 2 days prior to your scheduled appointment for arrival times.  7)  Nothing to eat or drink after midnight the night before your procedure.  8)  Take .     With a sip of water the morning of your procedure.  9)  Other Instructions: Call [phone] to cancel the morning of your procedure due         to illness or emergency.  10) We will call within 72 hours to assess how you are feeling.

## 2016-02-12 NOTE — Anesthesia Procedure Notes (Signed)
Performed by: Senie Lanese Pre-anesthesia Checklist: Patient identified, Patient being monitored, Timeout performed, Emergency Drugs available and Suction available Patient Re-evaluated:Patient Re-evaluated prior to inductionOxygen Delivery Method: Circle system utilized Preoxygenation: Pre-oxygenation with 100% oxygen Ventilation: Mask ventilation without difficulty Airway Equipment and Method: Bite block Dental Injury: Teeth and Oropharynx as per pre-operative assessment        

## 2016-02-12 NOTE — Anesthesia Postprocedure Evaluation (Signed)
Anesthesia Post Note  Patient: ILYN SHORTT  Procedure(s) Performed: * No procedures listed *  Patient location during evaluation: PACU Anesthesia Type: General Level of consciousness: awake and alert Pain management: pain level controlled Vital Signs Assessment: post-procedure vital signs reviewed and stable Respiratory status: spontaneous breathing, nonlabored ventilation, respiratory function stable and patient connected to nasal cannula oxygen Cardiovascular status: blood pressure returned to baseline and stable Postop Assessment: no signs of nausea or vomiting Anesthetic complications: no    Last Vitals:  Vitals:   02/09/16 1121 02/09/16 1127  BP: (!) 156/71   Pulse: 66 61  Resp: 18 18  Temp:  36.7 C    Last Pain:  Vitals:   02/09/16 1127  TempSrc: Oral  PainSc: 0-No pain                 Martha Clan

## 2016-04-22 DIAGNOSIS — F331 Major depressive disorder, recurrent, moderate: Secondary | ICD-10-CM | POA: Diagnosis not present

## 2016-04-22 DIAGNOSIS — F411 Generalized anxiety disorder: Secondary | ICD-10-CM | POA: Diagnosis not present

## 2016-04-23 ENCOUNTER — Other Ambulatory Visit: Payer: Self-pay | Admitting: Family Medicine

## 2016-04-23 ENCOUNTER — Telehealth: Payer: Self-pay | Admitting: Family Medicine

## 2016-04-23 DIAGNOSIS — Z1239 Encounter for other screening for malignant neoplasm of breast: Secondary | ICD-10-CM

## 2016-04-23 NOTE — Telephone Encounter (Signed)
Pt stated that she was seeing Dr. Venia Minks and is scheduled to see Dr. Caryn Section on 05/14/16 but b/c she hasn't seen Dr. Caryn Section yet [facility] won't schedule her mammogram without an order. Pt is requesting an order to be sent to [facility]. Please advise. Thanks TNP

## 2016-04-23 NOTE — Telephone Encounter (Signed)
OK to order screening mammogram.  

## 2016-04-23 NOTE — Telephone Encounter (Signed)
Please advise 

## 2016-04-24 NOTE — Telephone Encounter (Signed)
Screening mammogram ordered. Left patient a voicemail advising her that she can call to schedule a appointment now and if any problems to call us back.

## 2016-05-14 ENCOUNTER — Encounter: Payer: Self-pay | Admitting: Family Medicine

## 2016-05-14 ENCOUNTER — Ambulatory Visit (INDEPENDENT_AMBULATORY_CARE_PROVIDER_SITE_OTHER): Payer: Medicare Other | Admitting: Family Medicine

## 2016-05-14 VITALS — BP 134/64 | HR 82 | Temp 98.4°F | Resp 16 | Ht 69.0 in | Wt 197.0 lb

## 2016-05-14 DIAGNOSIS — E039 Hypothyroidism, unspecified: Secondary | ICD-10-CM

## 2016-05-14 DIAGNOSIS — Z0000 Encounter for general adult medical examination without abnormal findings: Secondary | ICD-10-CM

## 2016-05-14 DIAGNOSIS — Z1231 Encounter for screening mammogram for malignant neoplasm of breast: Secondary | ICD-10-CM | POA: Diagnosis not present

## 2016-05-14 DIAGNOSIS — E7800 Pure hypercholesterolemia, unspecified: Secondary | ICD-10-CM | POA: Diagnosis not present

## 2016-05-14 DIAGNOSIS — R319 Hematuria, unspecified: Secondary | ICD-10-CM | POA: Diagnosis not present

## 2016-05-14 DIAGNOSIS — R7309 Other abnormal glucose: Secondary | ICD-10-CM

## 2016-05-14 DIAGNOSIS — I10 Essential (primary) hypertension: Secondary | ICD-10-CM

## 2016-05-14 DIAGNOSIS — Z1159 Encounter for screening for other viral diseases: Secondary | ICD-10-CM | POA: Diagnosis not present

## 2016-05-14 DIAGNOSIS — Z1239 Encounter for other screening for malignant neoplasm of breast: Secondary | ICD-10-CM

## 2016-05-14 LAB — POCT URINALYSIS DIPSTICK
BILIRUBIN UA: NEGATIVE
Glucose, UA: NEGATIVE
Ketones, UA: NEGATIVE
NITRITE UA: NEGATIVE
PROTEIN UA: NEGATIVE
Spec Grav, UA: 1.01
Urobilinogen, UA: 0.2
pH, UA: 7.5

## 2016-05-14 NOTE — Progress Notes (Signed)
Patient: Wanda Ochoa, Female    DOB: Jul 26, 1945, 71 y.o.   MRN: [id number] Visit Date: 05/14/2016  Today's Provider: Lelon Huh, MD   Chief Complaint  Patient presents with  . Medicare Wellness  . Hyperlipidemia  . Asthma   Subjective:    Annual wellness visit Wanda Ochoa is a 70 y.o. female. She feels well. She reports exercising yes/walking. She reports she is sleeping fairly well.  -----------------------------------------------------------  Asthma, exogenous, unspecified asthma severity, uncomplicated From [id number] albuterol inhaler   Lipid/Cholesterol, Follow-up:   Last seen for this 1 years ago.  Management since that visit includes; no changes, advised to work on lifestyle.  Last Lipid Panel:    Component Value Date/Time   CHOL 217 (H) 05/04/2015 0854   TRIG 124 05/04/2015 0854   HDL 62 05/04/2015 0854   LDLCALC 130 (H) 05/04/2015 0854    She reports excellent compliance with treatment. She is not having side effects.   Wt Readings from Last 3 Encounters:  05/14/16 197 lb (89.4 kg)  12/11/15 195 lb (88.5 kg)  05/02/15 188 lb (85.3 kg)    ----------------------------------------------------------------    Review of Systems  Constitutional: Negative.   HENT: Positive for tinnitus.   Eyes: Negative.   Respiratory: Negative.   Cardiovascular: Positive for leg swelling.  Gastrointestinal: Negative.   Endocrine: Negative.   Genitourinary: Negative.   Musculoskeletal: Negative.   Skin: Negative.   Allergic/Immunologic: Negative.   Neurological: Negative.   Hematological: Negative.   Psychiatric/Behavioral: The patient is nervous/anxious.     Social History   Social History  . Marital status: Married    Spouse name: N/A  . Number of children: 3  . Years of education: College   Occupational History  . [employer]er [employer]    Part-time   Social History Main Topics  .  Smoking status: Never Smoker  . Smokeless tobacco: Never Used  . Alcohol use No  . Drug use: No  . Sexual activity: Not on file   Other Topics Concern  . Not on file   Social History Narrative  . No narrative on file    Past Medical History:  Diagnosis Date  . Anxiety   . Asthma   . HTN (hypertension)   . Thyroid disease      Patient Active Problem List   Diagnosis Date Noted  . Severe recurrent major depression without psychotic features ([place])   . Hypercholesteremia 05/02/2015  . Billowing mitral valve 02/21/2015  . Heart valve disease 02/21/2015  . Upper respiratory infection 02/21/2015  . Allergic rhinitis 12/24/2014  . Anxiety 12/24/2014  . Below normal amount of sodium in the blood 12/24/2014  . Hypoxia 12/24/2014  . Osteopenia 12/24/2014  . Back strain 12/24/2014  . Airway hyperreactivity 12/14/2014  . Hypothyroidism 11/24/2014  . Generalized anxiety disorder 02/24/2012  . Nocturnal hypoxemia 01/20/2012  . Abdominal mass 01/20/2012  . Dyspnea 01/20/2012  . Temporary cerebral vascular dysfunction 01/02/2009  . Benign hypertension 03/28/2008  . Abnormal blood sugar 03/03/2008  . Asthma, exogenous 10/22/2007  . Clinical depression 08/19/2006  . Adaptive colitis 03/12/2003    Past Surgical History:  Procedure Laterality Date  . ABDOMINAL HYSTERECTOMY  1990  . BLADDER SUSPENSION  1990  . BREAST BIOPSY Right 1967   EXCISIONAL BX - NEG  . BREAST CYST ASPIRATION Right 1985  . BREAST SURGERY  1967  . CESAREAN SECTION    . HEMORRHOIDECTOMY WITH HEMORRHOID  BANDING  1988    Her family history includes Allergies in her brother and sister; Anxiety disorder in her mother; Asthma in her maternal uncle; Bipolar disorder in her son; Breast cancer in her paternal grandmother and sister; Colon cancer in her mother; Depression in her father and sister; Heart disease in her maternal grandfather, maternal grandmother, and mother; Hypertension in her mother; Suicidality in  her father.      Current Outpatient Prescriptions:  .  albuterol (PROAIR HFA) 108 (90 Base) MCG/ACT inhaler, Inhale 2 puffs into the lungs every 6 (six) hours as needed., Disp: 18 g, Rfl: 5 .  amLODipine (NORVASC) 5 MG tablet, Take 5 mg by mouth 2 (two) times daily. , Disp: , Rfl:  .  aspirin 81 MG tablet, Take 81 mg by mouth daily., Disp: , Rfl:  .  KRILL OIL PO, Take 1 capsule by mouth daily. Reported on 06/02/2015, Disp: , Rfl:  .  levothyroxine (SYNTHROID, LEVOTHROID) 112 MCG tablet, TAKE 1 TABLET(112 MCG) BY MOUTH DAILY, Disp: 90 tablet, Rfl: 4 .  lisinopril (PRINIVIL,ZESTRIL) 20 MG tablet, Take 20 mg by mouth 2 (two) times daily. , Disp: , Rfl:  .  metoprolol (LOPRESSOR) 50 MG tablet, TAKE 1 TABLET BY MOUTH TWICE DAILY, Disp: 180 tablet, Rfl: 3 .  Multiple Vitamin (MULTIVITAMIN) capsule, Take 1 capsule by mouth daily., Disp: , Rfl:  .  nortriptyline (PAMELOR) 25 MG capsule, Take 4 capsule at bed time (Patient taking differently: 3 (three) times daily. Take 4 capsule at bed time), Disp: 120 capsule, Rfl: 0 .  Omega 3-6-9 CAPS, Take by mouth., Disp: , Rfl:  .  QUEtiapine (SEROQUEL) 25 MG tablet, Take 25 mg by mouth 2 (two) times daily. , Disp: , Rfl:   Patient Care Team: Birdie Sons, MD as PCP - General (Family Medicine) Yolonda Kida, MD as Consulting Physician (Cardiology) Barbette Merino, NP as Nurse Practitioner (Cardiology) Chauncey Mann, MD as Referring Physician (Psychiatry)     Objective:   Vitals: BP 134/64 (BP Location: Right Arm, Patient Position: Sitting, Cuff Size: Normal)   Pulse 82   Temp 98.4 F (36.9 C) (Oral)   Resp 16   Ht 5\' 9"  (1.753 m)   Wt 197 lb (89.4 kg)   SpO2 98%   BMI 29.09 kg/m   Physical Exam   General Appearance:    Alert, cooperative, no distress, appears stated age  Head:    Normocephalic, without obvious abnormality, atraumatic  Eyes:    PERRL, conjunctiva/corneas clear, EOM's intact, fundi    benign, both eyes  Ears:    Normal  TM's and external ear canals, both ears  Nose:   Nares normal, septum midline, mucosa normal, no drainage    or sinus tenderness  Throat:   Lips, mucosa, and tongue normal; teeth and gums normal  Neck:   Supple, symmetrical, trachea midline, no adenopathy;    thyroid:  no enlargement/tenderness/nodules; no carotid   bruit or JVD  Back:     Symmetric, no curvature, ROM normal, no CVA tenderness  Lungs:     Clear to auscultation bilaterally, respirations unlabored  Chest Wall:    No tenderness or deformity   Heart:    Regular rate and rhythm, S1 and S2 normal, no murmur, rub   or gallop  Breast Exam:    normal appearance, no masses or tenderness  Abdomen:     Soft, non-tender, bowel sounds active all four quadrants,    no masses, no  organomegaly  Pelvic:    deferred  Extremities:   Extremities normal, atraumatic, no cyanosis or edema  Pulses:   2+ and symmetric all extremities  Skin:   Skin color, texture, turgor normal, no rashes or lesions  Lymph nodes:   Cervical, supraclavicular, and axillary nodes normal  Neurologic:   CNII-XII intact, normal strength, sensation and reflexes    throughout     Activities of Daily Living In your present state of health, do you have any difficulty performing the following activities: 05/14/2016  Hearing? N  Vision? N  Difficulty concentrating or making decisions? N  Walking or climbing stairs? N  Dressing or bathing? N  Doing errands, shopping? N  Some recent data might be hidden    Fall Risk Assessment Fall Risk  05/14/2016 05/02/2015  Falls in the past year? Yes No  Number falls in past yr: 1 -  Injury with Fall? No -     Depression Screen PHQ 2/9 Scores 05/14/2016 05/02/2015  PHQ - 2 Score 2 0  PHQ- 9 Score 3 -    Cognitive Testing - 6-CIT  Correct? Score   What year is it? yes 0 0 or 4  What month is it? yes 0 0 or 3  Memorize:    Wanda Ochoa,  43,  [address]  [place],      What time is it? (within 1 hour) yes 0 0 or 3  Count  backwards from 20 yes 0 0, 2, or 4  Name the months of the year yes 0 0, 2, or 4  Repeat name & address above yes 0 0, 2, 4, 6, 8, or 10       TOTAL SCORE  0/28   Interpretation:  Normal  Normal (0-7) Abnormal (8-28)    Audit-C Alcohol Use Screening  Question Answer Points  How often do you have alcoholic drink? never 0  On days you do drink alcohol, how many drinks do you typically consume? 0 0  How oftey will you drink 6 or more in a total? never 0  Total Score:  0   A score of 3 or more in women, and 4 or more in men indicates increased risk for alcohol abuse, EXCEPT if all of the points are from question 1.     Assessment & Plan:     Annual Wellness Visit  Reviewed patient's Family Medical History Reviewed and updated list of patient's medical providers Assessment of cognitive impairment was done Assessed patient's functional ability Established a written schedule for health screening [place] Completed and Reviewed  Exercise Activities and Dietary recommendations Goals    None      Immunization History  Administered Date(s) Administered  . Influenza Whole 01/16/2012  . Influenza, High Dose Seasonal PF 01/17/2016  . Influenza-Unspecified 12/23/2012  . Pneumococcal Conjugate-13 04/13/2014  . Pneumococcal Polysaccharide-23 01/16/2012  . Tdap 04/13/2014  . Zoster 01/29/2010    Health Maintenance  Topic Date Due  . Hepatitis C Screening  Sep 07, 1945  . MAMMOGRAM  04/24/2017  . COLONOSCOPY  04/09/2018  . DEXA SCAN  05/13/2019  . TETANUS/TDAP  04/13/2024  . INFLUENZA VACCINE  Completed  . PNA vac Low Risk Adult  Completed     Discussed health benefits of physical activity, and encouraged her to engage in regular exercise appropriate for her age and condition.    --------------------------------------------------------------------------  1. Medicare annual wellness visit, subsequent   2. Hematuria, unspecified type  - POCT  urinalysis dipstick  3. Breast cancer screening Normal exam. Mammogram tomorrow.   4. Need for hepatitis C screening test  - Hepatitis C antibody  5. Benign hypertension Well controlled.  Continue current medications.   - Renal function panel  6. Hypothyroidism, unspecified type  - TSH  7. Abnormal blood sugar  - Hemoglobin A1c  8. Hypercholesteremia  - Lipid panel  Lelon Huh, MD  [facility]

## 2016-05-15 ENCOUNTER — Ambulatory Visit
Admission: RE | Admit: 2016-05-15 | Discharge: 2016-05-15 | Disposition: A | Discharge status: Home / Self Care | Payer: Medicare Other | Source: Ambulatory Visit | Attending: Family Medicine | Admitting: Family Medicine

## 2016-05-15 DIAGNOSIS — Z1231 Encounter for screening mammogram for malignant neoplasm of breast: Secondary | ICD-10-CM | POA: Insufficient documentation

## 2016-05-16 DIAGNOSIS — E7800 Pure hypercholesterolemia, unspecified: Secondary | ICD-10-CM | POA: Diagnosis not present

## 2016-05-16 DIAGNOSIS — R7309 Other abnormal glucose: Secondary | ICD-10-CM | POA: Diagnosis not present

## 2016-05-16 DIAGNOSIS — Z1159 Encounter for screening for other viral diseases: Secondary | ICD-10-CM | POA: Diagnosis not present

## 2016-05-16 DIAGNOSIS — I10 Essential (primary) hypertension: Secondary | ICD-10-CM | POA: Diagnosis not present

## 2016-05-16 DIAGNOSIS — E039 Hypothyroidism, unspecified: Secondary | ICD-10-CM | POA: Diagnosis not present

## 2016-05-17 ENCOUNTER — Telehealth: Payer: Self-pay

## 2016-05-17 LAB — LIPID PANEL
Chol/HDL Ratio: 3.6 (ref 0.0–4.4)
Cholesterol, Total: 216 mg/dL — ABNORMAL HIGH (ref 100–199)
HDL: 60 mg/dL (ref >39)
LDL Calculated: 132 — ABNORMAL HIGH (ref 0–99)
Triglycerides: 122 mg/dL (ref 0–149)
VLDL Cholesterol Cal: 24 (ref 5–40)

## 2016-05-17 LAB — RENAL FUNCTION PANEL
ALBUMIN: 4.2 g/dL (ref 3.6–4.8)
BUN/Creatinine Ratio: 13 (ref 12–28)
BUN: 17 mg/dL (ref 8–27)
CO2: 25 mmol/L (ref 18–29)
CREATININE: 1.33 mg/dL — AB (ref 0.57–1.00)
Calcium: 9.4 mg/dL (ref 8.7–10.3)
Chloride: 99 mmol/L (ref 96–106)
GFR, EST AFRICAN AMERICAN: 47 — AB (ref >59)
GFR, EST NON AFRICAN AMERICAN: 41 — AB (ref >59)
GLUCOSE: 96 mg/dL (ref 65–99)
Phosphorus: 3.4 mg/dL (ref 2.5–4.5)
Potassium: 4.6 mmol/L (ref 3.5–5.2)
Sodium: 138 mmol/L (ref 134–144)

## 2016-05-17 LAB — HEMOGLOBIN A1C
ESTIMATED AVERAGE GLUCOSE: 103
HEMOGLOBIN A1C: 5.2 % (ref 4.8–5.6)

## 2016-05-17 LAB — HEPATITIS C ANTIBODY

## 2016-05-17 LAB — TSH: TSH: 3.27 u[IU]/mL (ref 0.450–4.500)

## 2016-05-17 NOTE — Telephone Encounter (Signed)
Left message to call back  

## 2016-05-17 NOTE — Telephone Encounter (Signed)
Pt informed and voiced understanding of results. 

## 2016-05-17 NOTE — Telephone Encounter (Signed)
-----   Message from Birdie Sons, MD sent at 05/17/2016  7:51 AM EST ----- Labs all good. Continue current medications. Follow up for BP and thyroid check 6 months.

## 2016-06-19 DIAGNOSIS — F331 Major depressive disorder, recurrent, moderate: Secondary | ICD-10-CM | POA: Diagnosis not present

## 2016-06-19 DIAGNOSIS — F411 Generalized anxiety disorder: Secondary | ICD-10-CM | POA: Diagnosis not present

## 2016-08-21 DIAGNOSIS — F411 Generalized anxiety disorder: Secondary | ICD-10-CM | POA: Diagnosis not present

## 2016-08-21 DIAGNOSIS — F331 Major depressive disorder, recurrent, moderate: Secondary | ICD-10-CM | POA: Diagnosis not present

## 2016-09-04 DIAGNOSIS — I872 Venous insufficiency (chronic) (peripheral): Secondary | ICD-10-CM | POA: Diagnosis not present

## 2016-09-04 DIAGNOSIS — I10 Essential (primary) hypertension: Secondary | ICD-10-CM | POA: Diagnosis not present

## 2016-09-04 DIAGNOSIS — E782 Mixed hyperlipidemia: Secondary | ICD-10-CM | POA: Diagnosis not present

## 2016-09-04 DIAGNOSIS — I341 Nonrheumatic mitral (valve) prolapse: Secondary | ICD-10-CM | POA: Diagnosis not present

## 2016-09-11 ENCOUNTER — Encounter: Payer: Self-pay | Admitting: Family Medicine

## 2016-09-11 ENCOUNTER — Ambulatory Visit (INDEPENDENT_AMBULATORY_CARE_PROVIDER_SITE_OTHER): Payer: Medicare Other | Admitting: Family Medicine

## 2016-09-11 VITALS — BP 130/80 | HR 86 | Temp 98.8°F | Resp 16 | Wt 197.0 lb

## 2016-09-11 DIAGNOSIS — R109 Unspecified abdominal pain: Secondary | ICD-10-CM | POA: Diagnosis not present

## 2016-09-11 DIAGNOSIS — R319 Hematuria, unspecified: Secondary | ICD-10-CM

## 2016-09-11 LAB — POCT URINALYSIS DIPSTICK
BILIRUBIN UA: NEGATIVE
GLUCOSE UA: NEGATIVE
Leukocytes, UA: NEGATIVE
NITRITE UA: NEGATIVE
Spec Grav, UA: 1.01 (ref 1.010–1.025)
UROBILINOGEN UA: 0.2 U/dL
pH, UA: 8 (ref 5.0–8.0)

## 2016-09-11 NOTE — Progress Notes (Signed)
Patient: Wanda Ochoa Female    DOB: 09-17-1945   71 y.o.   MRN: [id number] Visit Date: 09/11/2016  Today's Provider: Lelon Huh, MD   Chief Complaint  Patient presents with  . Flank Pain   Subjective:    Patient has had right flank pain for around 2 months. Pain in side is intermittent but does feel a constant pressure in that area. Pain does not radiate. Patient stated that she has had no urinary symptoms. Has not taken any medications for pain.   Flank Pain  This is a new problem. The current episode started more than 1 month ago (around 2 months). The problem occurs intermittently. The problem is unchanged. The quality of the pain is described as burning and aching. The pain does not radiate. The pain is at a severity of 8/10. The pain is severe. The pain is the same all the time. The symptoms are aggravated by bending and position. Pertinent negatives include no abdominal pain, bladder incontinence, bowel incontinence, chest pain, dysuria, fever, headaches, leg pain, numbness, paresis, paresthesias, pelvic pain, perianal numbness, tingling, weakness or weight loss. She has tried nothing for the symptoms.   Burning sensation is constant. No radiation.    Allergies  Allergen Reactions  . Penicillins Anaphylaxis     Current Outpatient Prescriptions:  .  albuterol (PROAIR HFA) 108 (90 Base) MCG/ACT inhaler, Inhale 2 puffs into the lungs every 6 (six) hours as needed., Disp: 18 g, Rfl: 5 .  amLODipine (NORVASC) 5 MG tablet, Take 5 mg by mouth 2 (two) times daily. , Disp: , Rfl:  .  aspirin 81 MG tablet, Take 81 mg by mouth daily., Disp: , Rfl:  .  KRILL OIL PO, Take 1 capsule by mouth daily. Reported on 06/02/2015, Disp: , Rfl:  .  levothyroxine (SYNTHROID, LEVOTHROID) 112 MCG tablet, TAKE 1 TABLET(112 MCG) BY MOUTH DAILY, Disp: 90 tablet, Rfl: 4 .  lisinopril (PRINIVIL,ZESTRIL) 20 MG tablet, Take 20 mg by mouth 2 (two) times daily. , Disp: , Rfl:  .  metoprolol  (LOPRESSOR) 50 MG tablet, TAKE 1 TABLET BY MOUTH TWICE DAILY, Disp: 180 tablet, Rfl: 3 .  Multiple Vitamin (MULTIVITAMIN) capsule, Take 1 capsule by mouth daily., Disp: , Rfl:  .  nortriptyline (PAMELOR) 25 MG capsule, Take 4 capsule at bed time (Patient taking differently: 3 (three) times daily. Take 4 capsule at bed time), Disp: 120 capsule, Rfl: 0 .  Omega 3-6-9 CAPS, Take by mouth., Disp: , Rfl:  .  QUEtiapine (SEROQUEL) 25 MG tablet, Take 25 mg by mouth 2 (two) times daily. , Disp: , Rfl:   Review of Systems  Constitutional: Negative for appetite change, chills, fatigue, fever and weight loss.  Respiratory: Negative for chest tightness and shortness of breath.   Cardiovascular: Negative for chest pain and palpitations.  Gastrointestinal: Negative for abdominal pain, bowel incontinence, nausea and vomiting.  Genitourinary: Positive for flank pain. Negative for bladder incontinence, dysuria and pelvic pain.  Neurological: Negative for dizziness, tingling, weakness, numbness, headaches and paresthesias.    Social History  Substance Use Topics  . Smoking status: Never Smoker  . Smokeless tobacco: Never Used  . Alcohol use No   Objective:   BP 130/80 (BP Location: Right Arm, Patient Position: Sitting, Cuff Size: Normal)   Pulse 86   Temp 98.8 F (37.1 C) (Oral)   Resp 16   Wt 197 lb (89.4 kg)   SpO2 97%   BMI 29.09 kg/m  Vitals:   09/11/16 1432  BP: 130/80  Pulse: 86  Resp: 16  Temp: 98.8 F (37.1 C)  TempSrc: Oral  SpO2: 97%  Weight: 197 lb (89.4 kg)     Physical Exam  General Appearance:    Alert, cooperative, no distress  Eyes:    PERRL, conjunctiva/corneas clear, EOM's intact       Lungs:     Clear to auscultation bilaterally, respirations unlabored  Heart:    Regular rate and rhythm  Abdomen:   soft. CVA tenderness is present on the right     Results for orders placed or performed in visit on 09/11/16  POCT urinalysis dipstick  Result Value Ref Range    Color, UA Dark Yellow    Clarity, UA Cloudy    Glucose, UA Neg    Bilirubin, UA Neg    Ketones, UA Trace    Spec Grav, UA 1.010 1.010 - 1.025   Blood, UA Moderate-non hemolyzed    pH, UA 8.0 5.0 - 8.0   Protein, UA Trace    Urobilinogen, UA 0.2 0.2 or 1.0 E.U./dL   Nitrite, UA Neg    Leukocytes, UA Negative Negative       Assessment & Plan:     1. Right flank pain If urine culture negative will need renal ultrasound.   - POCT urinalysis dipstick - CULTURE, URINE COMPREHENSIVE  2. Hematuria, unspecified type Has had this for many years and she reports it has been thoroughly evaluated.  - CULTURE, URINE COMPREHENSIVE       Lelon Huh, MD  [facility]

## 2016-09-13 LAB — CULTURE, URINE COMPREHENSIVE

## 2016-09-16 ENCOUNTER — Telehealth: Payer: Self-pay | Admitting: *Deleted

## 2016-09-16 DIAGNOSIS — R109 Unspecified abdominal pain: Secondary | ICD-10-CM

## 2016-09-16 DIAGNOSIS — R319 Hematuria, unspecified: Secondary | ICD-10-CM

## 2016-09-16 NOTE — Telephone Encounter (Signed)
Patient was notified of results. Patient stated that she is still having right flank pain and would like to go ahead with [place].

## 2016-09-16 NOTE — Telephone Encounter (Signed)
-----   Message from Birdie Sons, MD sent at 09/13/2016  9:56 PM EDT ----- No sign of infection on urine culture. If still having pain then need renal ultrasound for flank pain and hematuria

## 2016-09-16 NOTE — Telephone Encounter (Signed)
Please schedule US Renal. Thanks!

## 2016-09-20 ENCOUNTER — Ambulatory Visit: Payer: Medicare Other

## 2016-09-24 ENCOUNTER — Ambulatory Visit
Admission: RE | Admit: 2016-09-24 | Discharge: 2016-09-24 | Disposition: A | Discharge status: Home / Self Care | Payer: Medicare Other | Source: Ambulatory Visit | Attending: Physician Assistant | Admitting: Physician Assistant

## 2016-09-24 DIAGNOSIS — R319 Hematuria, unspecified: Secondary | ICD-10-CM

## 2016-09-24 DIAGNOSIS — R109 Unspecified abdominal pain: Secondary | ICD-10-CM

## 2016-09-30 ENCOUNTER — Telehealth: Payer: Self-pay | Admitting: Family Medicine

## 2016-09-30 NOTE — Telephone Encounter (Signed)
Pt is calling needing results from her [place] last Tuesday.  Her callback is [phone]  Thanks, Con Memos

## 2016-09-30 NOTE — Telephone Encounter (Signed)
Please review. Thanks!  

## 2016-10-01 NOTE — Telephone Encounter (Signed)
Left message to call back  

## 2016-10-01 NOTE — Telephone Encounter (Signed)
Ultrasound is completely normal. Pain is most likely muscular and should resolved over the next few weeks.

## 2016-10-04 NOTE — Telephone Encounter (Signed)
Patient advised and verbally voiced understanding.  

## 2016-10-04 NOTE — Telephone Encounter (Signed)
[id number] 

## 2016-12-18 DIAGNOSIS — F411 Generalized anxiety disorder: Secondary | ICD-10-CM | POA: Diagnosis not present

## 2016-12-18 DIAGNOSIS — F331 Major depressive disorder, recurrent, moderate: Secondary | ICD-10-CM | POA: Diagnosis not present

## 2017-02-05 DIAGNOSIS — H2513 Age-related nuclear cataract, bilateral: Secondary | ICD-10-CM | POA: Diagnosis not present

## 2017-02-08 ENCOUNTER — Other Ambulatory Visit: Payer: Self-pay | Admitting: Family Medicine

## 2017-02-08 DIAGNOSIS — I10 Essential (primary) hypertension: Secondary | ICD-10-CM

## 2017-02-08 DIAGNOSIS — E039 Hypothyroidism, unspecified: Secondary | ICD-10-CM

## 2017-03-07 DIAGNOSIS — M1711 Unilateral primary osteoarthritis, right knee: Secondary | ICD-10-CM | POA: Insufficient documentation

## 2017-03-07 DIAGNOSIS — M25561 Pain in right knee: Secondary | ICD-10-CM | POA: Diagnosis not present

## 2017-03-08 DIAGNOSIS — F331 Major depressive disorder, recurrent, moderate: Secondary | ICD-10-CM | POA: Diagnosis not present

## 2017-03-08 DIAGNOSIS — F411 Generalized anxiety disorder: Secondary | ICD-10-CM | POA: Diagnosis not present

## 2017-03-12 DIAGNOSIS — I341 Nonrheumatic mitral (valve) prolapse: Secondary | ICD-10-CM | POA: Diagnosis not present

## 2017-03-12 DIAGNOSIS — I10 Essential (primary) hypertension: Secondary | ICD-10-CM | POA: Diagnosis not present

## 2017-03-12 DIAGNOSIS — E782 Mixed hyperlipidemia: Secondary | ICD-10-CM | POA: Diagnosis not present

## 2017-03-12 DIAGNOSIS — I872 Venous insufficiency (chronic) (peripheral): Secondary | ICD-10-CM | POA: Diagnosis not present

## 2017-03-12 DIAGNOSIS — I38 Endocarditis, valve unspecified: Secondary | ICD-10-CM | POA: Diagnosis not present

## 2017-03-26 ENCOUNTER — Other Ambulatory Visit: Payer: Self-pay | Admitting: Family Medicine

## 2017-03-26 DIAGNOSIS — J45909 Unspecified asthma, uncomplicated: Secondary | ICD-10-CM

## 2017-03-26 NOTE — Telephone Encounter (Signed)
Pharmacy requesting refills. Thanks!  

## 2017-03-28 ENCOUNTER — Ambulatory Visit (INDEPENDENT_AMBULATORY_CARE_PROVIDER_SITE_OTHER): Payer: Medicare Other | Admitting: Family Medicine

## 2017-03-28 ENCOUNTER — Encounter: Payer: Self-pay | Admitting: Family Medicine

## 2017-03-28 VITALS — BP 104/60 | HR 82 | Temp 99.6°F | Resp 20 | Wt 194.0 lb

## 2017-03-28 DIAGNOSIS — J040 Acute laryngitis: Secondary | ICD-10-CM | POA: Diagnosis not present

## 2017-03-28 DIAGNOSIS — J40 Bronchitis, not specified as acute or chronic: Secondary | ICD-10-CM | POA: Diagnosis not present

## 2017-03-28 MED ORDER — AZITHROMYCIN 250 MG PO TABS: ORAL_TABLET | ORAL | 0 refills | Status: AC

## 2017-03-28 NOTE — Progress Notes (Signed)
Patient: Wanda Ochoa Female    DOB: [birth year]   72 y.o.   MRN: [id number] Visit Date: 03/28/2017  Today's Provider: Lelon Huh, MD   Chief Complaint  Patient presents with  . Cough   Subjective:    Cough  This is a new problem. Episode onset: 4 days ago. The problem has been unchanged. The cough is productive of sputum (clear colored sputum). Associated symptoms include headaches, nasal congestion, rhinorrhea and wheezing. Pertinent negatives include no chest pain, chills, ear congestion, ear pain, fever, hemoptysis, myalgias, postnasal drip, sore throat, shortness of breath or sweats. Treatments tried: Ventolin inhaler. The treatment provided moderate relief.       Allergies  Allergen Reactions  . Penicillins Anaphylaxis     Current Outpatient Medications:  .  amLODipine (NORVASC) 5 MG tablet, Take 5 mg by mouth 2 (two) times daily. , Disp: , Rfl:  .  aspirin 81 MG tablet, Take 81 mg by mouth daily., Disp: , Rfl:  .  KRILL OIL PO, Take 1 capsule by mouth daily. Reported on 06/02/2015, Disp: , Rfl:  .  levothyroxine (SYNTHROID, LEVOTHROID) 112 MCG tablet, TAKE 1 TABLET(112 MCG) BY MOUTH DAILY, Disp: 90 tablet, Rfl: 2 .  lisinopril (PRINIVIL,ZESTRIL) 20 MG tablet, Take 20 mg by mouth 2 (two) times daily. , Disp: , Rfl:  .  metoprolol tartrate (LOPRESSOR) 50 MG tablet, TAKE 1 TABLET BY MOUTH TWICE DAILY, Disp: 180 tablet, Rfl: 2 .  Multiple Vitamin (MULTIVITAMIN) capsule, Take 1 capsule by mouth daily., Disp: , Rfl:  .  nortriptyline (PAMELOR) 25 MG capsule, Take 4 capsule at bed time (Patient taking differently: 3 (three) times daily. Take 4 capsule at bed time), Disp: 120 capsule, Rfl: 0 .  Omega 3-6-9 CAPS, Take by mouth., Disp: , Rfl:  .  QUEtiapine (SEROQUEL) 25 MG tablet, Take 25 mg by mouth 2 (two) times daily. , Disp: , Rfl:  .  VENTOLIN HFA 108 (90 Base) MCG/ACT inhaler, INHALE 2 PUFFS INTO THE LUNGS EVERY 6 HOURS AS NEEDED, Disp: 18 g, Rfl: 4  Review of  Systems  Constitutional: Negative for appetite change, chills, fatigue and fever.  HENT: Positive for congestion, rhinorrhea and voice change. Negative for ear pain, postnasal drip and sore throat.   Respiratory: Positive for cough and wheezing. Negative for hemoptysis, chest tightness and shortness of breath.   Cardiovascular: Negative for chest pain and palpitations.  Gastrointestinal: Negative for abdominal pain, nausea and vomiting.  Musculoskeletal: Negative for myalgias.  Neurological: Positive for headaches. Negative for dizziness and weakness.    Social History   Tobacco Use  . Smoking status: Never Smoker  . Smokeless tobacco: Never Used  Substance Use Topics  . Alcohol use: No   Objective:   BP 104/60 (BP Location: Left Arm, Patient Position: Sitting, Cuff Size: Large)   Pulse 82   Temp 99.6 F (37.6 C) (Oral)   Resp 20   Wt 194 lb (88 kg)   SpO2 98% Comment: room air  BMI 28.65 kg/m  There were no vitals filed for this visit.   Physical Exam  General Appearance:    Alert, cooperative, no distress  HENT:   bilateral TM normal without fluid or infection, neck without nodes, pharynx erythematous without exudate and sinuses nontender. Voice is hoarse.   Eyes:    PERRL, conjunctiva/corneas clear, EOM's intact       Lungs:     Rare expiratory wheeze, respirations unlabored  Heart:  Regular rate and rhythm  Neurologic:   Awake, alert, oriented x 3. No apparent focal neurological           defect.           Assessment & Plan:     1. Bronchitis  - azithromycin (ZITHROMAX) 250 MG tablet; 2 by mouth today, then 1 daily for 4 days  Dispense: 6 tablet; Refill: 0  2. Laryngitis        Lelon Huh, MD  [facility]

## 2017-03-28 NOTE — Patient Instructions (Signed)
Laryngitis Laryngitis is swelling (inflammation) of your vocal cords. This causes hoarseness, coughing, loss of voice, sore throat, or a dry throat. When your vocal cords are inflamed, your voice sounds different. Laryngitis can be temporary (acute) or long-term (chronic). Most cases of acute laryngitis improve with time. Chronic laryngitis is laryngitis that lasts for more than three weeks. Follow these instructions at home:  Drink enough fluid to keep your pee (urine) clear or pale yellow.  Breathe in moist air. Use a humidifier if you live in a dry climate.  Take medicines only as told by your doctor.  Do not smoke cigarettes or electronic cigarettes. If you need help quitting, ask your doctor.  Talk as little as possible. Also avoid whispering, which can cause vocal strain.  Write instead of talking. Do this until your voice is back to normal. Contact a doctor if:  You have a fever.  Your pain is worse.  You have trouble swallowing. Get help right away if:  You cough up blood.  You have trouble breathing. This information is not intended to replace advice given to you by your health care provider. Make sure you discuss any questions you have with your health care provider. Document Released: 02/28/2011 Document Revised: 08/17/2015 Document Reviewed: 08/24/2013 Elsevier Interactive Patient Education  2018 Elsevier Inc.  

## 2017-05-16 DIAGNOSIS — M1711 Unilateral primary osteoarthritis, right knee: Secondary | ICD-10-CM | POA: Diagnosis not present

## 2017-05-20 ENCOUNTER — Other Ambulatory Visit: Payer: Self-pay | Admitting: Family Medicine

## 2017-05-20 DIAGNOSIS — Z1231 Encounter for screening mammogram for malignant neoplasm of breast: Secondary | ICD-10-CM

## 2017-05-22 ENCOUNTER — Telehealth: Payer: Self-pay | Admitting: Family Medicine

## 2017-06-02 DIAGNOSIS — F411 Generalized anxiety disorder: Secondary | ICD-10-CM | POA: Diagnosis not present

## 2017-06-02 DIAGNOSIS — F331 Major depressive disorder, recurrent, moderate: Secondary | ICD-10-CM | POA: Diagnosis not present

## 2017-06-04 ENCOUNTER — Ambulatory Visit
Admission: RE | Admit: 2017-06-04 | Discharge: 2017-06-04 | Disposition: A | Discharge status: Home / Self Care | Payer: Medicare Other | Source: Ambulatory Visit | Attending: Family Medicine | Admitting: Family Medicine

## 2017-06-04 DIAGNOSIS — Z1231 Encounter for screening mammogram for malignant neoplasm of breast: Secondary | ICD-10-CM

## 2017-06-13 ENCOUNTER — Ambulatory Visit (INDEPENDENT_AMBULATORY_CARE_PROVIDER_SITE_OTHER): Payer: Medicare Other

## 2017-06-13 VITALS — BP 136/78 | HR 68 | Temp 98.8°F | Ht 69.0 in | Wt 188.0 lb

## 2017-06-13 DIAGNOSIS — Z0000 Encounter for general adult medical examination without abnormal findings: Secondary | ICD-10-CM

## 2017-06-13 NOTE — Progress Notes (Signed)
Subjective:   Wanda Ochoa is a 72 y.o. female who presents for Medicare Annual (Subsequent) preventive examination.  Review of Systems:  N/A  Cardiac Risk Factors include: advanced age (>69men, >50 women);dyslipidemia;hypertension     Objective:     Vitals: BP 136/78 (BP Location: Left Arm)   Pulse 68   Temp 98.8 F (37.1 C) (Oral)   Ht 5\' 9"  (1.753 m)   Wt 188 lb (85.3 kg)   BMI 27.76 kg/m   Body mass index is 27.76 kg/m.  Advanced Directives 06/13/2017 05/02/2015 02/21/2015  Does Patient Have a Medical Advance Directive? Yes Yes Yes  Type of Advance Directive Living will [place];Living will Living will;Healthcare Power of Attorney    Tobacco Social History   Tobacco Use  Smoking Status Never Smoker  Smokeless Tobacco Never Used     Counseling given: Not Answered   Clinical Intake:  Pre-visit preparation completed: Yes  Pain : No/denies pain Pain Score: 0-No pain     Nutritional Status: BMI 25 -29 Overweight Nutritional Risks: None Diabetes: No  How often do you need to have someone help you when you read instructions, pamphlets, or other written materials from your doctor or pharmacy?: 1 - Never  Interpreter Needed?: No  Information entered by :: [facility], LPN  Past Medical History:  Diagnosis Date  . Anxiety   . Arthritis    right knee  . Asthma   . HTN (hypertension)   . Thyroid disease    Past Surgical History:  Procedure Laterality Date  . ABDOMINAL HYSTERECTOMY  1990  . BLADDER SUSPENSION  1990  . BREAST CYST ASPIRATION Right 1985  . BREAST EXCISIONAL BIOPSY Right 1967   EXCISIONAL BX - NEG  . BREAST SURGERY  1967  . CESAREAN SECTION    . HEMORRHOIDECTOMY WITH HEMORRHOID BANDING  1988   Family History  Problem Relation Age of Onset  . Depression Father   . Suicidality Father   . Depression Sister   . Breast cancer Sister 24  . Bipolar disorder Son   . Heart disease Mother   . Colon cancer Mother     . Hypertension Mother   . Anxiety disorder Mother   . Allergies Brother   . Allergies Sister   . Asthma Maternal Uncle   . Heart disease Maternal Grandfather   . Heart disease Maternal Grandmother   . Breast cancer Paternal Grandmother   . Breast cancer Other    Social History   Socioeconomic History  . Marital status: Married    Spouse name: Not on file  . Number of children: 3  . Years of education: College  . Highest education level: Bachelor's degree (e.g., BA, AB, BS)  Occupational History    Employer: [employer]    Comment: volunteer at hospital  Social Needs  . Financial resource strain: Not hard at all  . Food insecurity:    Worry: Never true    Inability: Never true  . Transportation needs:    Medical: No    Non-medical: No  Tobacco Use  . Smoking status: Never Smoker  . Smokeless tobacco: Never Used  Substance and Sexual Activity  . Alcohol use: No  . Drug use: No  . Sexual activity: Not on file  Lifestyle  . Physical activity:    Days per week: Not on file    Minutes per session: Not on file  . Stress: To some extent  Relationships  . Social  connections:    Talks on phone: Not on file    Gets together: Not on file    Attends religious service: Not on file    Active member of club or organization: Not on file    Attends meetings of clubs or organizations: Not on file    Relationship status: Not on file  Other Topics Concern  . Not on file  Social History Narrative  . Not on file    Outpatient Encounter Medications as of 06/13/2017  Medication Sig  . amLODipine (NORVASC) 5 MG tablet Take 5 mg by mouth 2 (two) times daily.   Marland Kitchen aspirin 81 MG tablet Take 81 mg by mouth daily.  Marland Kitchen KRILL OIL PO Take 1 capsule by mouth daily. Reported on 06/02/2015  . levothyroxine (SYNTHROID, LEVOTHROID) 112 MCG tablet TAKE 1 TABLET(112 MCG) BY MOUTH DAILY  . lisinopril (PRINIVIL,ZESTRIL) 20 MG tablet Take 20 mg by mouth 2 (two) times daily.   .  metoprolol tartrate (LOPRESSOR) 50 MG tablet TAKE 1 TABLET BY MOUTH TWICE DAILY (Patient taking differently: TAKE 1/2 TABLET BY MOUTH TWICE DAILY)  . Multiple Vitamin (MULTIVITAMIN) capsule Take 1 capsule by mouth daily.  . nortriptyline (PAMELOR) 25 MG capsule Take 4 capsule at bed time (Patient taking differently: Take 75 mg by mouth at bedtime. Take 4 capsule at bed time)  . Omega 3-6-9 CAPS Take by mouth daily.   . QUEtiapine (SEROQUEL) 25 MG tablet Take 50 mg by mouth at bedtime.   . VENTOLIN HFA 108 (90 Base) MCG/ACT inhaler INHALE 2 PUFFS INTO THE LUNGS EVERY 6 HOURS AS NEEDED   No facility-administered encounter medications on file as of 06/13/2017.     Activities of Daily Living In your present state of health, do you have any difficulty performing the following activities: 06/13/2017  Hearing? N  Vision? N  Difficulty concentrating or making decisions? N  Walking or climbing stairs? N  Dressing or bathing? N  Doing errands, shopping? N  Preparing Food and eating ? N  Using the Toilet? N  In the past six months, have you accidently leaked urine? N  Do you have problems with loss of bowel control? N  Managing your Medications? N  Managing your Finances? N  Housekeeping or managing your Housekeeping? N  Some recent data might be hidden    Patient Care Team: Virginia Crews, MD as PCP - General (Family Medicine) Barbette Merino, NP as Nurse Practitioner (Cardiology) Chauncey Mann, MD as Referring Physician (Psychiatry) Watt Climes, PA as Physician Assistant (Physician Assistant) Corey Skains, MD as Consulting Physician (Cardiology)    Assessment:   This is a routine wellness examination for Wanda Ochoa.  Exercise Activities and Dietary recommendations Current Exercise Habits: Home exercise routine, Type of exercise: walking, Time (Minutes): 60, Frequency (Times/Week): 7, Weekly Exercise (Minutes/Week): 420, Intensity: Mild  Goals    . DIET - INCREASE WATER  INTAKE     Recommend increasing water intake to 4 glasses a day.        Fall Risk Fall Risk  06/13/2017 05/14/2016 05/02/2015  Falls in the past year? No Yes No  Number falls in past yr: - 1 -  Injury with Fall? - No -   Is the patient's home free of loose throw rugs in walkways, pet beds, electrical cords, etc?   yes      Grab bars in the bathroom? yes      Handrails on the stairs?   yes  Adequate lighting?   yes  Timed Get Up and Go performed: N/A  Depression Screen PHQ 2/9 Scores 06/13/2017 05/14/2016 05/02/2015  PHQ - 2 Score 0 2 0  PHQ- 9 Score - 3 -     Cognitive Function     6CIT Screen 06/13/2017  What Year? 0 points  What month? 0 points  What time? 0 points  Count back from 20 0 points  Months in reverse 0 points  Repeat phrase 0 points  Total Score 0    Immunization History  Administered Date(s) Administered  . Influenza Split 01/16/2012  . Influenza, High Dose Seasonal PF 01/17/2016  . Influenza-Unspecified 12/23/2012  . Pneumococcal Conjugate-13 04/13/2014  . Pneumococcal Polysaccharide-23 01/16/2012  . Tdap 04/13/2014  . Zoster 01/29/2010    Qualifies for Shingles Vaccine? Due for Shingles vaccine. Declined my offer to administer today. Education has been provided regarding the importance of this vaccine. Pt has been advised to call her insurance company to determine her out of pocket expense. Advised she may also receive this vaccine at her local pharmacy or Health Dept. Verbalized acceptance and understanding.  Screening Tests Health Maintenance  Topic Date Due  . COLONOSCOPY  04/09/2018  . DEXA SCAN  05/13/2019  . MAMMOGRAM  06/05/2019  . TETANUS/TDAP  04/13/2024  . INFLUENZA VACCINE  Completed  . Hepatitis C Screening  Completed  . PNA vac Low Risk Adult  Completed    Cancer Screenings: Lung: Low Dose CT Chest recommended if Age 8-80 years, 30 pack-year currently smoking OR have quit w/in 15years. Patient does not qualify. Breast:  Up to  date on Mammogram? Yes   Up to date of Bone Density/Dexa? Yes Colorectal: Up to date  Additional Screenings:  Hepatitis C Screening: Up to date     Plan:  I have personally reviewed and addressed the Medicare Annual Wellness questionnaire and have noted the following in the patient's chart:  A. Medical and social history B. Use of alcohol, tobacco or illicit drugs  C. Current medications and supplements D. Functional ability and status E.  Nutritional status F.  Physical activity G. Advance directives H. List of other physicians I.  Hospitalizations, surgeries, and ER visits in previous 12 months J.  [place] such as hearing and vision if needed, cognitive and depression L. Referrals and appointments - none  In addition, I have reviewed and discussed with patient certain preventive protocols, quality metrics, and best practice recommendations. A written personalized care plan for preventive services as well as general preventive health recommendations were provided to patient.  See attached scanned questionnaire for additional information.   Signed,  Fabio Neighbors, LPN Nurse Health Advisor   Nurse Recommendations: None.

## 2017-06-13 NOTE — Patient Instructions (Addendum)
Wanda Ochoa , Thank you for taking time to come for your Medicare Wellness Visit. I appreciate your ongoing commitment to your health goals. Please review the following plan we discussed and let me know if I can assist you in the future.   Screening recommendations/referrals: Colonoscopy: Up to date Mammogram: Up to date Bone Density: Up to date Recommended yearly ophthalmology/optometry visit for glaucoma screening and checkup Recommended yearly dental visit for hygiene and checkup  Vaccinations: Influenza vaccine: Up to date Pneumococcal vaccine: Up to date Tdap vaccine: Up to date Shingles vaccine: Pt declines today.     Advanced directives: Please bring a copy of your POA (Power of Attorney) and/or Living Will to your next appointment.   Conditions/risks identified: Recommend increasing water intake to 4 glasses a day.   Next appointment: 06/18/17 @ 10 AM with Dr Caryn Section.   Preventive Care 26 Years and Older, Female Preventive care refers to lifestyle choices and visits with your health care provider that can promote health and wellness. What does preventive care include?  A yearly physical exam. This is also called an annual well check.  Dental exams once or twice a year.  Routine eye exams. Ask your health care provider how often you should have your eyes checked.  Personal lifestyle choices, including:  Daily care of your teeth and gums.  Regular physical activity.  Eating a healthy diet.  Avoiding tobacco and drug use.  Limiting alcohol use.  Practicing safe sex.  Taking low-dose aspirin every day.  Taking vitamin and mineral supplements as recommended by your health care provider. What happens during an annual well check? The services and screenings done by your health care provider during your annual well check will depend on your age, overall health, lifestyle risk factors, and family history of disease. Counseling  Your health care provider may ask you  questions about your:  Alcohol use.  Tobacco use.  Drug use.  Emotional well-being.  Home and relationship well-being.  Sexual activity.  Eating habits.  History of falls.  Memory and ability to understand (cognition).  Work and work Statistician.  Reproductive health. Screening  You may have the following tests or measurements:  Height, weight, and BMI.  Blood pressure.  Lipid and cholesterol levels. These may be checked every 5 years, or more frequently if you are over 37 years old.  Skin check.  Lung cancer screening. You may have this screening every year starting at age 39 if you have a 30-pack-year history of smoking and currently smoke or have quit within the past 15 years.  Fecal occult blood test (FOBT) of the stool. You may have this test every year starting at age 76.  Flexible sigmoidoscopy or colonoscopy. You may have a sigmoidoscopy every 5 years or a colonoscopy every 10 years starting at age 42.  Hepatitis C blood test.  Hepatitis B blood test.  Sexually transmitted disease (STD) testing.  Diabetes screening. This is done by checking your blood sugar (glucose) after you have not eaten for a while (fasting). You may have this done every 1-3 years.  Bone density scan. This is done to screen for osteoporosis. You may have this done starting at age 5.  Mammogram. This may be done every 1-2 years. Talk to your health care provider about how often you should have regular mammograms. Talk with your health care provider about your test results, treatment options, and if necessary, the need for more tests. Vaccines  Your health care provider may recommend  certain vaccines, such as:  Influenza vaccine. This is recommended every year.  Tetanus, diphtheria, and acellular pertussis (Tdap, Td) vaccine. You may need a Td booster every 10 years.  Zoster vaccine. You may need this after age 84.  Pneumococcal 13-valent conjugate (PCV13) vaccine. One dose is  recommended after age 109.  Pneumococcal polysaccharide (PPSV23) vaccine. One dose is recommended after age 11. Talk to your health care provider about which screenings and vaccines you need and how often you need them. This information is not intended to replace advice given to you by your health care provider. Make sure you discuss any questions you have with your health care provider. Document Released: 04/07/2015 Document Revised: 11/29/2015 Document Reviewed: 01/10/2015 Elsevier Interactive Patient Education  2017 [place] Prevention in the Home Falls can cause injuries. They can happen to people of all ages. There are many things you can do to make your home safe and to help prevent falls. What can I do on the outside of my home?  Regularly fix the edges of walkways and driveways and fix any cracks.  Remove anything that might make you trip as you walk through a door, such as a raised step or threshold.  Trim any bushes or trees on the path to your home.  Use bright outdoor lighting.  Clear any walking paths of anything that might make someone trip, such as rocks or tools.  Regularly check to see if handrails are loose or broken. Make sure that both sides of any steps have handrails.  Any raised decks and porches should have guardrails on the edges.  Have any leaves, snow, or ice cleared regularly.  Use sand or salt on walking paths during winter.  Clean up any spills in your garage right away. This includes oil or grease spills. What can I do in the bathroom?  Use night lights.  Install grab bars by the toilet and in the tub and shower. Do not use towel bars as grab bars.  Use non-skid mats or decals in the tub or shower.  If you need to sit down in the shower, use a plastic, non-slip stool.  Keep the floor dry. Clean up any water that spills on the floor as soon as it happens.  Remove soap buildup in the tub or shower regularly.  Attach bath mats  securely with double-sided non-slip rug tape.  Do not have throw rugs and other things on the floor that can make you trip. What can I do in the bedroom?  Use night lights.  Make sure that you have a light by your bed that is easy to reach.  Do not use any sheets or blankets that are too big for your bed. They should not hang down onto the floor.  Have a firm chair that has side arms. You can use this for support while you get dressed.  Do not have throw rugs and other things on the floor that can make you trip. What can I do in the kitchen?  Clean up any spills right away.  Avoid walking on wet floors.  Keep items that you use a lot in easy-to-reach places.  If you need to reach something above you, use a strong step stool that has a grab bar.  Keep electrical cords out of the way.  Do not use floor polish or wax that makes floors slippery. If you must use wax, use non-skid floor wax.  Do not have throw rugs and other  things on the floor that can make you trip. What can I do with my stairs?  Do not leave any items on the stairs.  Make sure that there are handrails on both sides of the stairs and use them. Fix handrails that are broken or loose. Make sure that handrails are as long as the stairways.  Check any carpeting to make sure that it is firmly attached to the stairs. Fix any carpet that is loose or worn.  Avoid having throw rugs at the top or bottom of the stairs. If you do have throw rugs, attach them to the floor with carpet tape.  Make sure that you have a light switch at the top of the stairs and the bottom of the stairs. If you do not have them, ask someone to add them for you. What else can I do to help prevent falls?  Wear shoes that:  Do not have high heels.  Have rubber bottoms.  Are comfortable and fit you well.  Are closed at the toe. Do not wear sandals.  If you use a stepladder:  Make sure that it is fully opened. Do not climb a closed  stepladder.  Make sure that both sides of the stepladder are locked into place.  Ask someone to hold it for you, if possible.  Clearly mark and make sure that you can see:  Any grab bars or handrails.  First and last steps.  Where the edge of each step is.  Use tools that help you move around (mobility aids) if they are needed. These include:  Canes.  Walkers.  Scooters.  Crutches.  Turn on the lights when you go into a dark area. Replace any light bulbs as soon as they burn out.  Set up your furniture so you have a clear path. Avoid moving your furniture around.  If any of your floors are uneven, fix them.  If there are any pets around you, be aware of where they are.  Review your medicines with your doctor. Some medicines can make you feel dizzy. This can increase your chance of falling. Ask your doctor what other things that you can do to help prevent falls. This information is not intended to replace advice given to you by your health care provider. Make sure you discuss any questions you have with your health care provider. Document Released: 01/05/2009 Document Revised: 08/17/2015 Document Reviewed: 04/15/2014 Elsevier Interactive Patient Education  2017 [employer].

## 2017-06-18 ENCOUNTER — Encounter: Payer: Self-pay | Admitting: Family Medicine

## 2017-06-18 ENCOUNTER — Ambulatory Visit (INDEPENDENT_AMBULATORY_CARE_PROVIDER_SITE_OTHER): Payer: Medicare Other | Admitting: Family Medicine

## 2017-06-18 VITALS — BP 152/76 | HR 75 | Temp 98.0°F | Resp 16 | Ht 69.0 in | Wt 188.0 lb

## 2017-06-18 DIAGNOSIS — M8580 Other specified disorders of bone density and structure, unspecified site: Secondary | ICD-10-CM | POA: Diagnosis not present

## 2017-06-18 DIAGNOSIS — F411 Generalized anxiety disorder: Secondary | ICD-10-CM | POA: Diagnosis not present

## 2017-06-18 DIAGNOSIS — M25461 Effusion, right knee: Secondary | ICD-10-CM | POA: Diagnosis not present

## 2017-06-18 DIAGNOSIS — F332 Major depressive disorder, recurrent severe without psychotic features: Secondary | ICD-10-CM | POA: Diagnosis not present

## 2017-06-18 DIAGNOSIS — E039 Hypothyroidism, unspecified: Secondary | ICD-10-CM | POA: Diagnosis not present

## 2017-06-18 DIAGNOSIS — I10 Essential (primary) hypertension: Secondary | ICD-10-CM

## 2017-06-18 DIAGNOSIS — Z780 Asymptomatic menopausal state: Secondary | ICD-10-CM | POA: Diagnosis not present

## 2017-06-18 DIAGNOSIS — E7800 Pure hypercholesterolemia, unspecified: Secondary | ICD-10-CM

## 2017-06-18 DIAGNOSIS — Z79899 Other long term (current) drug therapy: Secondary | ICD-10-CM | POA: Diagnosis not present

## 2017-06-18 NOTE — Assessment & Plan Note (Signed)
Recheck DEXA Recommend regular exercise and Ca/Vit D in diet

## 2017-06-18 NOTE — Assessment & Plan Note (Signed)
2/2 OA As it is improving and not causing pain/decrease in function, will continue NSAIDS, ice Advised on bracign for activity Could consider aspiration/steroid injection in the future

## 2017-06-18 NOTE — Assessment & Plan Note (Signed)
Well controlled on Nortriptyline and Seroquel Followed by psych Will check A1c and CBC given Seroquel use

## 2017-06-18 NOTE — Assessment & Plan Note (Signed)
Recheck lipid panel, calculate ASCVD risk

## 2017-06-18 NOTE — Assessment & Plan Note (Signed)
Asymptomatic Recheck TSH  Continue current Synthroid dose pending results

## 2017-06-18 NOTE — Patient Instructions (Signed)
Preventive Care 71 Years and Older, Female Preventive care refers to lifestyle choices and visits with your health care provider that can promote health and wellness. What does preventive care include?  A yearly physical exam. This is also called an annual well check.  Dental exams once or twice a year.  Routine eye exams. Ask your health care provider how often you should have your eyes checked.  Personal lifestyle choices, including: ? Daily care of your teeth and gums. ? Regular physical activity. ? Eating a healthy diet. ? Avoiding tobacco and drug use. ? Limiting alcohol use. ? Practicing safe sex. ? Taking low-dose aspirin every day. ? Taking vitamin and mineral supplements as recommended by your health care provider. What happens during an annual well check? The services and screenings done by your health care provider during your annual well check will depend on your age, overall health, lifestyle risk factors, and family history of disease. Counseling Your health care provider may ask you questions about your:  Alcohol use.  Tobacco use.  Drug use.  Emotional well-being.  Home and relationship well-being.  Sexual activity.  Eating habits.  History of falls.  Memory and ability to understand (cognition).  Work and work environment.  Reproductive health.  Screening You may have the following tests or measurements:  Height, weight, and BMI.  Blood pressure.  Lipid and cholesterol levels. These may be checked every 5 years, or more frequently if you are over 50 years old.  Skin check.  Lung cancer screening. You may have this screening every year starting at age 55 if you have a 30-pack-year history of smoking and currently smoke or have quit within the past 15 years.  Fecal occult blood test (FOBT) of the stool. You may have this test every year starting at age 50.  Flexible sigmoidoscopy or colonoscopy. You may have a sigmoidoscopy every 5 years or  a colonoscopy every 10 years starting at age 50.  Hepatitis C blood test.  Hepatitis B blood test.  Sexually transmitted disease (STD) testing.  Diabetes screening. This is done by checking your blood sugar (glucose) after you have not eaten for a while (fasting). You may have this done every 1-3 years.  Bone density scan. This is done to screen for osteoporosis. You may have this done starting at age 65.  Mammogram. This may be done every 1-2 years. Talk to your health care provider about how often you should have regular mammograms.  Talk with your health care provider about your test results, treatment options, and if necessary, the need for more tests. Vaccines Your health care provider may recommend certain vaccines, such as:  Influenza vaccine. This is recommended every year.  Tetanus, diphtheria, and acellular pertussis (Tdap, Td) vaccine. You may need a Td booster every 10 years.  Varicella vaccine. You may need this if you have not been vaccinated.  Zoster vaccine. You may need this after age 60.  Measles, mumps, and rubella (MMR) vaccine. You may need at least one dose of MMR if you were born in 1957 or later. You may also need a second dose.  Pneumococcal 13-valent conjugate (PCV13) vaccine. One dose is recommended after age 65.  Pneumococcal polysaccharide (PPSV23) vaccine. One dose is recommended after age 65.  Meningococcal vaccine. You may need this if you have certain conditions.  Hepatitis A vaccine. You may need this if you have certain conditions or if you travel or work in places where you may be exposed to hepatitis   A.  Hepatitis B vaccine. You may need this if you have certain conditions or if you travel or work in places where you may be exposed to hepatitis B.  Haemophilus influenzae type b (Hib) vaccine. You may need this if you have certain conditions.  Talk to your health care provider about which screenings and vaccines you need and how often you  need them. This information is not intended to replace advice given to you by your health care provider. Make sure you discuss any questions you have with your health care provider. Document Released: 04/07/2015 Document Revised: 11/29/2015 Document Reviewed: 01/10/2015 Elsevier Interactive Patient Education  2018 [employer]  

## 2017-06-18 NOTE — Assessment & Plan Note (Signed)
Well controlled. Continue current meds

## 2017-06-18 NOTE — Assessment & Plan Note (Signed)
Well controlled Followed by Psych 

## 2017-06-18 NOTE — Progress Notes (Signed)
Patient: Wanda Ochoa, Female    DOB: 05-08-1945, 72 y.o.   MRN: [id number] Visit Date: 06/18/2017  Today's Provider: Lavon Paganini, MD   I, Wanda Ochoa, CMA, am acting as scribe for Lavon Paganini, MD.  Chief Complaint  Patient presents with  . Annual Exam   Subjective:     Complete Physical Wanda Ochoa is a 72 y.o. female. She feels well. She reports exercising daily; walks for about 2 miles. She reports she is sleeping well with sleeping aid medication.  Last colonoscopy- 04/09/2013- Dr. Gustavo Lah. Redundant colon, otherwise WNL. Repeat 5 years. Last mammogram- 06/04/2017- BI-RADS 1 Last BMD- 05/12/2014- osteopenia.  R knee swelling/effusion: Has been having trouble with arthritis pain for last 2.5 months.  Had to stop running due to pain.  Saw Ortho PA who decided that no aspiration was necessary.  Patient uses Advil, ice to help.  Effusion has decreased.  Patient states she forgot to take anti-hypertensives this AM. -----------------------------------------------------------   Review of Systems  Constitutional: Negative.   HENT: Positive for tinnitus. Negative for congestion, dental problem, drooling, ear discharge, ear pain, facial swelling, hearing loss, mouth sores, nosebleeds, postnasal drip, rhinorrhea, sinus pressure, sinus pain, sneezing, sore throat, trouble swallowing and voice change.   Eyes: Negative.   Respiratory: Negative.   Cardiovascular: Negative.   Gastrointestinal: Negative.   Endocrine: Negative.   Genitourinary: Negative.   Musculoskeletal: Positive for arthralgias and joint swelling. Negative for back pain, gait problem, myalgias, neck pain and neck stiffness.  Skin: Negative.   Allergic/Immunologic: Negative.   Neurological: Negative.   Hematological: Negative.   Psychiatric/Behavioral: Positive for sleep disturbance. Negative for agitation, behavioral problems, confusion, decreased concentration, dysphoric mood,  hallucinations, self-injury and suicidal ideas. The patient is not nervous/anxious and is not hyperactive.     Social History   Socioeconomic History  . Marital status: Married    Spouse name: Saddie Sandeen  . Number of children: 3  . Years of education: 16  . Highest education level: Bachelor's degree (e.g., BA, AB, BS)  Occupational History    Employer: [employer]    Comment: volunteer at hospital  Social Needs  . Financial resource strain: Not hard at all  . Food insecurity:    Worry: Never true    Inability: Never true  . Transportation needs:    Medical: No    Non-medical: No  Tobacco Use  . Smoking status: Never Smoker  . Smokeless tobacco: Never Used  Substance and Sexual Activity  . Alcohol use: No  . Drug use: No  . Sexual activity: Not on file  Lifestyle  . Physical activity:    Days per week: Not on file    Minutes per session: Not on file  . Stress: To some extent  Relationships  . Social connections:    Talks on phone: Not on file    Gets together: Not on file    Attends religious service: Not on file    Active member of club or organization: Not on file    Attends meetings of clubs or organizations: Not on file    Relationship status: Not on file  . Intimate partner violence:    Fear of current or ex partner: Not on file    Emotionally abused: Not on file    Physically abused: Not on file    Forced sexual activity: Not on file  Other Topics Concern  . Not on file  Social History  Narrative  . Not on file    Past Medical History:  Diagnosis Date  . Anxiety   . Arthritis    right knee  . Asthma   . HTN (hypertension)   . Thyroid disease      Patient Active Problem List   Diagnosis Date Noted  . Hematuria 09/11/2016  . Severe recurrent major depression without psychotic features ([place])   . Hypercholesteremia 05/02/2015  . Mitral valve prolapse 02/21/2015  . Upper respiratory infection 02/21/2015  . Allergic  rhinitis 12/24/2014  . Anxiety 12/24/2014  . Below normal amount of sodium in the blood 12/24/2014  . Osteopenia 12/24/2014  . Back strain 12/24/2014  . Hypothyroidism 11/24/2014  . Generalized anxiety disorder 02/24/2012  . Nocturnal hypoxemia 01/20/2012  . Abdominal mass 01/20/2012  . Dyspnea 01/20/2012  . Temporary cerebral vascular dysfunction 01/02/2009  . Benign hypertension 03/28/2008  . Asthma, exogenous 10/22/2007  . Clinical depression 08/19/2006  . Adaptive colitis 03/12/2003    Past Surgical History:  Procedure Laterality Date  . ABDOMINAL HYSTERECTOMY  1990  . BLADDER SUSPENSION  1990  . BREAST CYST ASPIRATION Right 1985  . BREAST EXCISIONAL BIOPSY Right 1967   EXCISIONAL BX - NEG  . BREAST SURGERY Right 1967   Lumpectomy  . CESAREAN SECTION    . [place]    Her family history includes Allergies in her brother and sister; Anxiety disorder in her mother; Asthma in her maternal uncle; Autism in her son; Breast cancer in her other and paternal grandmother; Breast cancer (age of onset: 18) in her sister; Colon cancer in her mother; Depression in her father and sister; Heart disease in her maternal grandfather, maternal grandmother, and mother; Hypertension in her mother; Mental illness in her daughter; Suicidality in her father.      Current Outpatient Medications:  .  amLODipine (NORVASC) 5 MG tablet, Take 5 mg by mouth 2 (two) times daily. , Disp: , Rfl:  .  aspirin 81 MG tablet, Take 81 mg by mouth daily., Disp: , Rfl:  .  KRILL OIL PO, Take 1 capsule by mouth daily. Reported on 06/02/2015, Disp: , Rfl:  .  levothyroxine (SYNTHROID, LEVOTHROID) 112 MCG tablet, TAKE 1 TABLET(112 MCG) BY MOUTH DAILY, Disp: 90 tablet, Rfl: 2 .  lisinopril (PRINIVIL,ZESTRIL) 20 MG tablet, Take 20 mg by mouth 2 (two) times daily. , Disp: , Rfl:  .  metoprolol tartrate (LOPRESSOR) 50 MG tablet, TAKE 1 TABLET BY MOUTH TWICE DAILY (Patient taking  differently: TAKE 1/2 TABLET BY MOUTH TWICE DAILY), Disp: 180 tablet, Rfl: 2 .  Multiple Vitamin (MULTIVITAMIN) capsule, Take 1 capsule by mouth daily., Disp: , Rfl:  .  nortriptyline (PAMELOR) 25 MG capsule, Take 4 capsule at bed time (Patient taking differently: Take 75 mg by mouth at bedtime. Take 4 capsule at bed time), Disp: 120 capsule, Rfl: 0 .  Omega 3-6-9 CAPS, Take by mouth daily. , Disp: , Rfl:  .  QUEtiapine (SEROQUEL) 25 MG tablet, Take 50 mg by mouth at bedtime. , Disp: , Rfl:  .  VENTOLIN HFA 108 (90 Base) MCG/ACT inhaler, INHALE 2 PUFFS INTO THE LUNGS EVERY 6 HOURS AS NEEDED, Disp: 18 g, Rfl: 4  Patient Care Team: Virginia Crews, MD as PCP - General (Family Medicine) Barbette Merino, NP as Nurse Practitioner (Cardiology) Chauncey Mann, MD as Referring Physician (Psychiatry) Watt Climes, PA as Physician Assistant (Physician Assistant) Corey Skains, MD as Consulting Physician (Cardiology)  Objective:   Vitals: BP (!) 152/76 (BP Location: Left Arm, Patient Position: Sitting, Cuff Size: Large) Comment: forgot to take BP medication  Pulse 75   Temp 98 F (36.7 C) (Oral)   Resp 16   Ht 5\' 9"  (1.753 m)   Wt 188 lb (85.3 kg)   SpO2 99%   BMI 27.76 kg/m   Physical Exam  Constitutional: She is oriented to person, place, and time. She appears well-developed and well-nourished. No distress.  HENT:  Head: Normocephalic and atraumatic.  Right Ear: External ear normal.  Left Ear: External ear normal.  Nose: Nose normal.  Mouth/Throat: Oropharynx is clear and moist.  Eyes: Pupils are equal, round, and reactive to light. Conjunctivae and EOM are normal. No scleral icterus.  Neck: Neck supple. No thyromegaly present.  Cardiovascular: Normal rate, regular rhythm, normal heart sounds and intact distal pulses.  No murmur heard. Pulmonary/Chest: Breath sounds normal. No respiratory distress. She has no wheezes. She has no rales.  Abdominal: Soft. Bowel sounds are  normal. She exhibits no distension. There is no tenderness. There is no rebound and no guarding.  Musculoskeletal:  R Knee: No erythema or obvious bony abnormalities.  + obvious effusion Palpation normal with no warmth or joint line tenderness or patellar tenderness or condyle tenderness. ROM normal in flexion and extension and lower leg rotation. Ligaments with solid consistent endpoints including ACL, PCL, LCL, MCL. Negative Mcmurray's and provocative meniscal tests. Non painful patellar compression. Patellar and quadriceps tendons unremarkable. Hamstring and quadriceps strength is normal.  Lymphadenopathy:    She has no cervical adenopathy.  Neurological: She is alert and oriented to person, place, and time.  Skin: Skin is warm and dry. No rash noted.  Psychiatric: She has a normal mood and affect. Her behavior is normal.  Vitals reviewed.   Activities of Daily Living In your present state of health, do you have any difficulty performing the following activities: 06/13/2017  Hearing? N  Vision? N  Difficulty concentrating or making decisions? N  Walking or climbing stairs? N  Dressing or bathing? N  Doing errands, shopping? N  Preparing Food and eating ? N  Using the Toilet? N  In the past six months, have you accidently leaked urine? N  Do you have problems with loss of bowel control? N  Managing your Medications? N  Managing your Finances? N  Housekeeping or managing your Housekeeping? N  Some recent data might be hidden    Fall Risk Assessment Fall Risk  06/13/2017 05/14/2016 05/02/2015  Falls in the past year? No Yes No  Number falls in past yr: - 1 -  Injury with Fall? - No -     Depression Screen PHQ 2/9 Scores 06/13/2017 05/14/2016 05/02/2015  PHQ - 2 Score 0 2 0  PHQ- 9 Score - 3 -    6CIT Screen 06/13/2017  What Year? 0 points  What month? 0 points  What time? 0 points  Count back from 20 0 points  Months in reverse 0 points  Repeat phrase 0 points  Total  Score 0     Assessment & Plan:    Annual Physical Reviewed patient's Family Medical History Reviewed and updated list of patient's medical providers Assessment of cognitive impairment was done Assessed patient's functional ability Established a written schedule for health screening [place] Completed and Reviewed  Exercise Activities and Dietary recommendations Goals    . DIET - INCREASE WATER INTAKE     Recommend increasing  water intake to 4 glasses a day.        Immunization History  Administered Date(s) Administered  . Influenza Split 01/16/2012  . Influenza, High Dose Seasonal PF 01/17/2016  . Influenza-Unspecified 12/23/2012  . Pneumococcal Conjugate-13 04/13/2014  . Pneumococcal Polysaccharide-23 01/16/2012  . Tdap 04/13/2014  . Zoster 01/29/2010    Health Maintenance  Topic Date Due  . COLONOSCOPY  04/09/2018  . DEXA SCAN  05/13/2019  . MAMMOGRAM  06/05/2019  . TETANUS/TDAP  04/13/2024  . INFLUENZA VACCINE  Completed  . Hepatitis C Screening  Completed  . PNA vac Low Risk Adult  Completed     Discussed health benefits of physical activity, and encouraged her to engage in regular exercise appropriate for her age and condition.    ------------------------------------------------------------------------------------------------------------ Problem List Items Addressed This Visit      Cardiovascular and Mediastinum   Benign hypertension - Primary    Well controlled Continue current meds      Relevant Orders   Comprehensive metabolic panel     Endocrine   Hypothyroidism    Asymptomatic Recheck TSH  Continue current Synthroid dose pending results      Relevant Orders   TSH     Musculoskeletal and Integument   Osteopenia    Recheck DEXA Recommend regular exercise and Ca/Vit D in diet       Relevant Orders   DG Bone Density   Effusion of right knee    2/2 OA As it is improving and not causing pain/decrease in function,  will continue NSAIDS, ice Advised on bracign for activity Could consider aspiration/steroid injection in the future        Other   Generalized anxiety disorder    Well controlled Followed by Psych      Hypercholesteremia    Recheck lipid panel, calculate ASCVD risk      Relevant Orders   Comprehensive metabolic panel   Lipid panel   Severe recurrent major depression without psychotic features ([place])    Well controlled on Nortriptyline and Seroquel Followed by psych Will check A1c and CBC given Seroquel use       Other Visit Diagnoses    Long-term use of high-risk medication       Relevant Orders   Hemoglobin A1c   CBC w/Diff/Platelet   Postmenopausal       Relevant Orders   DG Bone Density       Return in about 1 year (around 06/19/2018) for physical.   The entirety of the information documented in the History of Present Illness, Review of Systems and Physical Exam were personally obtained by me. Portions of this information were initially documented by Raquel Sarna Ratchford, CMA and reviewed by me for thoroughness and accuracy.    Virginia Crews, MD, MPH [facility] 06/18/2017 12:03 PM

## 2017-06-19 LAB — CBC WITH DIFFERENTIAL/PLATELET
Basophils Absolute: 0 10*3/uL (ref 0.0–0.2)
Basos: 1 %
EOS (ABSOLUTE): 0.1 10*3/uL (ref 0.0–0.4)
Eos: 2 %
HEMATOCRIT: 34.7 % (ref 34.0–46.6)
HEMOGLOBIN: 11.7 g/dL (ref 11.1–15.9)
Immature Grans (Abs): 0 10*3/uL (ref 0.0–0.1)
Immature Granulocytes: 0 %
LYMPHS ABS: 1.8 10*3/uL (ref 0.7–3.1)
Lymphs: 34 %
MCH: 32.1 pg (ref 26.6–33.0)
MCHC: 33.7 g/dL (ref 31.5–35.7)
MCV: 95 fL (ref 79–97)
MONOCYTES: 8 %
MONOS ABS: 0.4 10*3/uL (ref 0.1–0.9)
NEUTROS ABS: 2.8 10*3/uL (ref 1.4–7.0)
Neutrophils: 55 %
Platelets: 226 10*3/uL (ref 150–379)
RBC: 3.64 x10E6/uL — AB (ref 3.77–5.28)
RDW: 12.8 % (ref 12.3–15.4)
WBC: 5.2 10*3/uL (ref 3.4–10.8)

## 2017-06-19 LAB — COMPREHENSIVE METABOLIC PANEL
A/G RATIO: 1.4 (ref 1.2–2.2)
ALT: 20 IU/L (ref 0–32)
AST: 24 IU/L (ref 0–40)
Albumin: 4.1 g/dL (ref 3.5–4.8)
Alkaline Phosphatase: 108 IU/L (ref 39–117)
BILIRUBIN TOTAL: 0.5 mg/dL (ref 0.0–1.2)
BUN/Creatinine Ratio: 10 — ABNORMAL LOW (ref 12–28)
BUN: 11 mg/dL (ref 8–27)
CHLORIDE: 101 mmol/L (ref 96–106)
CO2: 24 mmol/L (ref 20–29)
Calcium: 9.3 mg/dL (ref 8.7–10.3)
Creatinine, Ser: 1.11 mg/dL — ABNORMAL HIGH (ref 0.57–1.00)
GFR calc non Af Amer: 50 mL/min/{1.73_m2} — ABNORMAL LOW (ref >59)
GFR, EST AFRICAN AMERICAN: 58 mL/min/{1.73_m2} — AB (ref >59)
Globulin, Total: 3 g/dL (ref 1.5–4.5)
Glucose: 95 mg/dL (ref 65–99)
POTASSIUM: 4.2 mmol/L (ref 3.5–5.2)
Sodium: 140 mmol/L (ref 134–144)
TOTAL PROTEIN: 7.1 g/dL (ref 6.0–8.5)

## 2017-06-19 LAB — LIPID PANEL
Chol/HDL Ratio: 3.4 ratio (ref 0.0–4.4)
Cholesterol, Total: 199 mg/dL (ref 100–199)
HDL: 59 mg/dL (ref >39)
LDL Calculated: 112 mg/dL — ABNORMAL HIGH (ref 0–99)
Triglycerides: 138 mg/dL (ref 0–149)
VLDL Cholesterol Cal: 28 mg/dL (ref 5–40)

## 2017-06-19 LAB — TSH: TSH: 1.01 u[IU]/mL (ref 0.450–4.500)

## 2017-06-19 LAB — HEMOGLOBIN A1C
ESTIMATED AVERAGE GLUCOSE: 103 mg/dL
HEMOGLOBIN A1C: 5.2 % (ref 4.8–5.6)

## 2017-06-20 ENCOUNTER — Telehealth: Payer: Self-pay

## 2017-06-20 NOTE — Telephone Encounter (Signed)
-----   Message from Virginia Crews, MD sent at 06/19/2017  9:13 AM EDT ----- Normal liver function, electrolytes.  Kidney function is stable. Cholesterol is slightly high.  10 year risk of heart disease/stroke is high at 11 %, which is mostly elevated by patient's age and hypertension.  Technically this is high enough to consider statin medication to lower cholesterol, but will leave this option up to the patient.  Normal thyroid function and blood sugars and blood counts.

## 2017-06-20 NOTE — Telephone Encounter (Signed)
lmtcb

## 2017-06-23 NOTE — Telephone Encounter (Signed)
Pt advised. She states she would like to work on diet for now.

## 2017-06-26 ENCOUNTER — Encounter: Payer: Self-pay | Admitting: Family Medicine

## 2017-06-26 NOTE — Telephone Encounter (Signed)
complete

## 2017-06-30 DIAGNOSIS — F411 Generalized anxiety disorder: Secondary | ICD-10-CM | POA: Diagnosis not present

## 2017-06-30 DIAGNOSIS — F331 Major depressive disorder, recurrent, moderate: Secondary | ICD-10-CM | POA: Diagnosis not present

## 2017-07-22 ENCOUNTER — Other Ambulatory Visit: Payer: Self-pay

## 2017-07-23 ENCOUNTER — Telehealth: Payer: Self-pay

## 2017-07-23 ENCOUNTER — Ambulatory Visit
Admission: RE | Admit: 2017-07-23 | Discharge: 2017-07-23 | Disposition: A | Discharge status: Home / Self Care | Payer: Medicare Other | Source: Ambulatory Visit | Attending: Family Medicine | Admitting: Family Medicine

## 2017-07-23 DIAGNOSIS — Z780 Asymptomatic menopausal state: Secondary | ICD-10-CM | POA: Diagnosis not present

## 2017-07-23 DIAGNOSIS — M8580 Other specified disorders of bone density and structure, unspecified site: Secondary | ICD-10-CM | POA: Diagnosis present

## 2017-07-23 DIAGNOSIS — M8588 Other specified disorders of bone density and structure, other site: Secondary | ICD-10-CM | POA: Insufficient documentation

## 2017-07-23 NOTE — Telephone Encounter (Signed)
-----   Message from Virginia Crews, MD sent at 07/23/2017 11:38 AM EDT ----- Bone density scan shows osteopenia (this is some bone loss, but not as bad as osteoporosis).  Recommend regular weight bearing exercise, avoiding smoking, and adequate Ca (1200mg /day) and Vit D (1000 units daily) via diet or supplement.  We will recheck in 2 years to ensure this hasn't worsened.  Virginia Crews, MD, MPH [facility] 07/23/2017 11:38 AM

## 2017-07-23 NOTE — Telephone Encounter (Signed)
Tried calling pt. Call was disconnected. Will try again later.

## 2017-07-24 NOTE — Telephone Encounter (Signed)
Left message advising pt. OK per DPR. 

## 2017-09-29 DIAGNOSIS — F411 Generalized anxiety disorder: Secondary | ICD-10-CM | POA: Diagnosis not present

## 2017-09-29 DIAGNOSIS — F331 Major depressive disorder, recurrent, moderate: Secondary | ICD-10-CM | POA: Diagnosis not present

## 2017-10-01 NOTE — Addendum Note (Signed)
Addended by: Virginia Crews on: 10/01/2017 11:00 AM   Modules accepted: Level of Service

## 2017-10-08 DIAGNOSIS — E782 Mixed hyperlipidemia: Secondary | ICD-10-CM | POA: Diagnosis not present

## 2017-10-08 DIAGNOSIS — I38 Endocarditis, valve unspecified: Secondary | ICD-10-CM | POA: Diagnosis not present

## 2017-10-08 DIAGNOSIS — I872 Venous insufficiency (chronic) (peripheral): Secondary | ICD-10-CM | POA: Diagnosis not present

## 2017-10-08 DIAGNOSIS — I341 Nonrheumatic mitral (valve) prolapse: Secondary | ICD-10-CM | POA: Diagnosis not present

## 2017-10-08 DIAGNOSIS — I10 Essential (primary) hypertension: Secondary | ICD-10-CM | POA: Diagnosis not present

## 2017-11-05 DIAGNOSIS — I38 Endocarditis, valve unspecified: Secondary | ICD-10-CM | POA: Diagnosis not present

## 2017-11-05 DIAGNOSIS — I872 Venous insufficiency (chronic) (peripheral): Secondary | ICD-10-CM | POA: Diagnosis not present

## 2017-11-05 DIAGNOSIS — I10 Essential (primary) hypertension: Secondary | ICD-10-CM | POA: Diagnosis not present

## 2017-11-05 DIAGNOSIS — E782 Mixed hyperlipidemia: Secondary | ICD-10-CM | POA: Diagnosis not present

## 2017-11-05 DIAGNOSIS — I341 Nonrheumatic mitral (valve) prolapse: Secondary | ICD-10-CM | POA: Diagnosis not present

## 2017-11-07 ENCOUNTER — Other Ambulatory Visit: Payer: Self-pay | Admitting: Family Medicine

## 2017-11-07 DIAGNOSIS — E039 Hypothyroidism, unspecified: Secondary | ICD-10-CM

## 2017-12-05 DIAGNOSIS — Z23 Encounter for immunization: Secondary | ICD-10-CM | POA: Diagnosis not present

## 2017-12-24 DIAGNOSIS — F331 Major depressive disorder, recurrent, moderate: Secondary | ICD-10-CM | POA: Diagnosis not present

## 2017-12-24 DIAGNOSIS — F411 Generalized anxiety disorder: Secondary | ICD-10-CM | POA: Diagnosis not present

## 2018-03-19 DIAGNOSIS — H2513 Age-related nuclear cataract, bilateral: Secondary | ICD-10-CM | POA: Diagnosis not present

## 2018-04-01 DIAGNOSIS — K5909 Other constipation: Secondary | ICD-10-CM | POA: Diagnosis not present

## 2018-04-01 DIAGNOSIS — F411 Generalized anxiety disorder: Secondary | ICD-10-CM | POA: Diagnosis not present

## 2018-04-01 DIAGNOSIS — F331 Major depressive disorder, recurrent, moderate: Secondary | ICD-10-CM | POA: Diagnosis not present

## 2018-04-01 DIAGNOSIS — Z86010 Personal history of colonic polyps: Secondary | ICD-10-CM | POA: Diagnosis not present

## 2018-05-06 ENCOUNTER — Ambulatory Visit: Admit: 2018-05-06 | Payer: Medicare Other | Admitting: Internal Medicine

## 2018-05-06 SURGERY — COLONOSCOPY WITH PROPOFOL
Anesthesia: General

## 2018-05-13 DIAGNOSIS — F331 Major depressive disorder, recurrent, moderate: Secondary | ICD-10-CM | POA: Diagnosis not present

## 2018-05-13 DIAGNOSIS — F411 Generalized anxiety disorder: Secondary | ICD-10-CM | POA: Diagnosis not present

## 2018-06-08 DIAGNOSIS — F411 Generalized anxiety disorder: Secondary | ICD-10-CM | POA: Diagnosis not present

## 2018-06-08 DIAGNOSIS — F331 Major depressive disorder, recurrent, moderate: Secondary | ICD-10-CM | POA: Diagnosis not present

## 2018-06-17 ENCOUNTER — Ambulatory Visit: Payer: Self-pay

## 2018-06-24 ENCOUNTER — Ambulatory Visit: Payer: Self-pay

## 2018-06-24 ENCOUNTER — Encounter: Payer: Self-pay | Admitting: Family Medicine

## 2018-07-29 DIAGNOSIS — F5105 Insomnia due to other mental disorder: Secondary | ICD-10-CM | POA: Diagnosis not present

## 2018-07-29 DIAGNOSIS — F331 Major depressive disorder, recurrent, moderate: Secondary | ICD-10-CM | POA: Diagnosis not present

## 2018-07-29 DIAGNOSIS — F411 Generalized anxiety disorder: Secondary | ICD-10-CM | POA: Diagnosis not present

## 2018-08-02 ENCOUNTER — Other Ambulatory Visit: Payer: Self-pay | Admitting: Family Medicine

## 2018-08-02 DIAGNOSIS — E039 Hypothyroidism, unspecified: Secondary | ICD-10-CM

## 2018-08-03 ENCOUNTER — Ambulatory Visit (INDEPENDENT_AMBULATORY_CARE_PROVIDER_SITE_OTHER): Payer: Medicare Other | Admitting: Family Medicine

## 2018-08-03 VITALS — BP 132/71 | HR 85

## 2018-08-03 DIAGNOSIS — I10 Essential (primary) hypertension: Secondary | ICD-10-CM

## 2018-08-03 DIAGNOSIS — F332 Major depressive disorder, recurrent severe without psychotic features: Secondary | ICD-10-CM

## 2018-08-03 DIAGNOSIS — E039 Hypothyroidism, unspecified: Secondary | ICD-10-CM

## 2018-08-03 DIAGNOSIS — F411 Generalized anxiety disorder: Secondary | ICD-10-CM | POA: Diagnosis not present

## 2018-08-03 DIAGNOSIS — E7800 Pure hypercholesterolemia, unspecified: Secondary | ICD-10-CM | POA: Diagnosis not present

## 2018-08-03 NOTE — Progress Notes (Signed)
Patient: Wanda Ochoa Female    DOB: Feb 25, 1946   73 y.o.   MRN: [id number] Visit Date: 08/03/2018  Today's Provider: Lavon Paganini, MD   Chief Complaint  Patient presents with  . Hypothyroidism   Subjective:    I, Wanda Ochoa CMA, am acting as a scribe for Lavon Paganini, MD.   Virtual Visit via Telephone Note  I connected with Wanda Ochoa on 08/03/18 at 11:20 AM EDT by telephone and verified that I am speaking with the correct person using two identifiers.   Patient location: home Provider location: home office  Persons involved in the visit: patient, provider   I discussed the limitations, risks, security and privacy concerns of performing an evaluation and management service by telephone and the availability of in person appointments. I also discussed with the patient that there may be a patient responsible charge related to this service. The patient expressed understanding and agreed to proceed.    HPI  Hypothyroid, follow-up:  TSH  Date Value Ref Range Status  06/18/2017 1.010 0.450 - 4.500 uIU/mL Final  05/16/2016 3.270 0.450 - 4.500 uIU/mL Final  12/06/2014 1.300 0.450 - 4.500 uIU/mL Final   Wt Readings from Last 3 Encounters:  06/18/17 188 lb (85.3 kg)  06/13/17 188 lb (85.3 kg)  03/28/17 194 lb (88 kg)    She was last seen for hypothyroid 1 years ago.  Management since that visit includes none. She reports good compliance with treatment. She is not having side effects.  She is not exercising. She is experiencing none She denies change in energy level, diarrhea, heat / cold intolerance, nervousness, palpitations and weight changes Weight trend: stable  ------------------------------------------------------------------------ HTN: - Medications: amlodipine 5mg  BID, lisinopril 20mg  BID, Metoprolol 25mg  BID - Compliance: good - Checking BP at home: yes - Denies any SOB, CP, vision changes, LE edema, medication SEs, or  symptoms of hypotension - Diet: low sodium  Depression, anxiety: Followed by Dr Nicolasa Ducking (Psych)    Allergies  Allergen Reactions  . Penicillins Anaphylaxis     Current Outpatient Medications:  .  amLODipine (NORVASC) 5 MG tablet, Take 5 mg by mouth 2 (two) times daily. , Disp: , Rfl:  .  aspirin 81 MG tablet, Take 81 mg by mouth daily., Disp: , Rfl:  .  KRILL OIL PO, Take 1 capsule by mouth daily. Reported on 06/02/2015, Disp: , Rfl:  .  levothyroxine (SYNTHROID, LEVOTHROID) 112 MCG tablet, TAKE 1 TABLET(112 MCG) BY MOUTH DAILY, Disp: 90 tablet, Rfl: 2 .  lisinopril (PRINIVIL,ZESTRIL) 20 MG tablet, Take 20 mg by mouth 2 (two) times daily. , Disp: , Rfl:  .  metoprolol tartrate (LOPRESSOR) 50 MG tablet, TAKE 1 TABLET BY MOUTH TWICE DAILY (Patient taking differently: TAKE 1/2 TABLET BY MOUTH TWICE DAILY), Disp: 180 tablet, Rfl: 2 .  Multiple Vitamin (MULTIVITAMIN) capsule, Take 1 capsule by mouth daily., Disp: , Rfl:  .  nortriptyline (PAMELOR) 25 MG capsule, Take 4 capsule at bed time (Patient taking differently: Take 75 mg by mouth at bedtime. Take 4 capsule at bed time), Disp: 120 capsule, Rfl: 0 .  Omega 3-6-9 CAPS, Take by mouth daily. , Disp: , Rfl:  .  QUEtiapine (SEROQUEL) 25 MG tablet, Take 50 mg by mouth at bedtime. , Disp: , Rfl:  .  VENTOLIN HFA 108 (90 Base) MCG/ACT inhaler, INHALE 2 PUFFS INTO THE LUNGS EVERY 6 HOURS AS NEEDED, Disp: 18 g, Rfl: 4  Review of Systems  Constitutional: Negative.   Respiratory: Negative.   Genitourinary: Negative.   Psychiatric/Behavioral: Negative.     Social History   Tobacco Use  . Smoking status: Never Smoker  . Smokeless tobacco: Never Used  Substance Use Topics  . Alcohol use: No      Objective:   BP 132/71   Pulse 85  Vitals:   08/03/18 1156  BP: 132/71  Pulse: 85     Physical Exam      Assessment & Plan   Follow Up Instructions: I discussed the assessment and treatment plan with the patient. The patient was  provided an opportunity to ask questions and all were answered. The patient agreed with the plan and demonstrated an understanding of the instructions.   The patient was advised to call back or seek an in-person evaluation if the symptoms worsen or if the condition fails to improve as anticipated.  Problem List Items Addressed This Visit      Cardiovascular and Mediastinum   Benign hypertension    Well controlled Continue current meds Metabolic panel at upcoming CPE        Endocrine   Hypothyroidism - Primary    Previously well controlled Continue Synthroid at current dose  Recheck TSH at upcoming CPE and adjust Synthroid as indicated          Other   Generalized anxiety disorder    Well controlled Followed by Psych      Hypercholesteremia    Plan to recheck FLP at upcoming CPE Not on a statin currently      Severe recurrent major depression without psychotic features ([place])    Well controlled Followed by Psych          Return in about 4 weeks (around 08/31/2018) for CPE as scheduled (will need to be chronic disease f/u as she has Medicare A&B).   The entirety of the information documented in the History of Present Illness, Review of Systems and Physical Exam were personally obtained by me. Portions of this information were initially documented by [facility], CMA and reviewed by me for thoroughness and accuracy.    Virginia Crews, MD, MPH [facility] 08/03/2018 11:57 AM

## 2018-08-03 NOTE — Assessment & Plan Note (Signed)
Well controlled Followed by Duane Lope

## 2018-08-03 NOTE — Assessment & Plan Note (Signed)
Previously well controlled Continue Synthroid at current dose  Recheck TSH at upcoming CPE and adjust Synthroid as indicated

## 2018-08-03 NOTE — Telephone Encounter (Signed)
Patient had e-visit today 08/03/2018.

## 2018-08-03 NOTE — Assessment & Plan Note (Signed)
Well controlled Followed by Wanda Ochoa

## 2018-08-03 NOTE — Telephone Encounter (Signed)
Has been >1 yr since last visit.  Needs hypothyroidism and other chronic disease f/u visit.  Ok to do as evisit.  Can give 30 day supply to hold her until appt if needed

## 2018-08-03 NOTE — Assessment & Plan Note (Signed)
Well controlled Continue current meds Metabolic panel at upcoming CPE

## 2018-08-03 NOTE — Assessment & Plan Note (Signed)
Plan to recheck FLP at upcoming CPE Not on a statin currently

## 2018-08-05 ENCOUNTER — Ambulatory Visit (INDEPENDENT_AMBULATORY_CARE_PROVIDER_SITE_OTHER): Payer: Medicare Other

## 2018-08-05 DIAGNOSIS — Z1231 Encounter for screening mammogram for malignant neoplasm of breast: Secondary | ICD-10-CM

## 2018-08-05 DIAGNOSIS — Z0000 Encounter for general adult medical examination without abnormal findings: Secondary | ICD-10-CM

## 2018-08-05 DIAGNOSIS — Z1211 Encounter for screening for malignant neoplasm of colon: Secondary | ICD-10-CM | POA: Diagnosis not present

## 2018-08-05 NOTE — Progress Notes (Signed)
Subjective:   Wanda Ochoa is a 73 y.o. female who presents for Medicare Annual (Subsequent) preventive examination.    This visit is being conducted through telemedicine due to the COVID-19 pandemic. This patient has given me verbal consent via doximity to conduct this visit, patient states they are participating from their home address. Some vital signs may be absent or patient reported.    Patient identification: identified by name, DOB, and current address  Review of Systems:  N/A  Cardiac Risk Factors include: advanced age (>1men, >72 women);dyslipidemia;hypertension     Objective:     Vitals: There were no vitals taken for this visit.  There is no height or weight on file to calculate BMI. Unable to obtain vitals due to visit being conducted via telephonically.    Advanced Directives 08/05/2018 06/13/2017 05/02/2015 02/21/2015  Does Patient Have a Medical Advance Directive? Yes Yes Yes Yes  Type of Paramedic of [place];Living will Living will [place];Living will Living will;Healthcare Power of [place] in Chart? No - copy requested - - -    Tobacco Social History   Tobacco Use  Smoking Status Never Smoker  Smokeless Tobacco Never Used     Counseling given: Not Answered   Clinical Intake:  Pre-visit preparation completed: Yes  Pain : No/denies pain Pain Score: 0-No pain     Nutritional Status: BMI 25 -29 Overweight Nutritional Risks: None Diabetes: No  How often do you need to have someone help you when you read instructions, pamphlets, or other written materials from your doctor or pharmacy?: 1 - Never  Interpreter Needed?: No  Information entered by :: [facility], LPN  Past Medical History:  Diagnosis Date  . Anxiety   . Arthritis    right knee  . Asthma   . HTN (hypertension)   . Thyroid disease    Past Surgical History:  Procedure Laterality Date  .  ABDOMINAL HYSTERECTOMY  1990  . BLADDER SUSPENSION  1990  . BREAST CYST ASPIRATION Right 1985  . BREAST EXCISIONAL BIOPSY Right 1967   EXCISIONAL BX - NEG  . BREAST SURGERY Right 1967   Lumpectomy  . CESAREAN SECTION    . HEMORRHOIDECTOMY WITH HEMORRHOID BANDING  1988   Family History  Problem Relation Age of Onset  . Depression Father   . Suicidality Father   . Depression Sister   . Breast cancer Sister 36  . Autism Son   . Heart disease Mother   . Colon cancer Mother   . Hypertension Mother   . Anxiety disorder Mother   . Mental illness Daughter   . Allergies Brother   . Allergies Sister   . Asthma Maternal Uncle   . Heart disease Maternal Grandfather   . Heart disease Maternal Grandmother   . Breast cancer Paternal Grandmother   . Breast cancer Other    Social History   Socioeconomic History  . Marital status: Married    Spouse name: Daisy Lites  . Number of children: 3  . Years of education: 16  . Highest education level: Bachelor's degree (e.g., BA, AB, BS)  Occupational History  . 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 Financial resource strain: Not hard at all  . Food insecurity:    Worry: Never true    Inability: Never true  . Transportation needs:  Medical: No    Non-medical: No  Tobacco Use  . Smoking status: Never Smoker  . Smokeless tobacco: Never Used  Substance and Sexual Activity  . Alcohol use: No  . Drug use: No  . Sexual activity: Not on file  Lifestyle  . Physical activity:    Days per week: 0 days    Minutes per session: 0 min  . Stress: Rather much  Relationships  . Social connections:    Talks on phone: Patient refused    Gets together: Patient refused    Attends religious service: Patient refused    Active member of club or organization: Patient refused    Attends meetings of clubs or organizations: Patient refused    Relationship status:  Patient refused  Other Topics Concern  . Not on file  Social History Narrative  . Not on file    Outpatient Encounter Medications as of 08/05/2018  Medication Sig  . amLODipine (NORVASC) 5 MG tablet Take 5 mg by mouth 2 (two) times daily.   Marland Kitchen aspirin 81 MG tablet Take 81 mg by mouth daily.  Marland Kitchen levothyroxine (SYNTHROID) 112 MCG tablet TAKE 1 TABLET(112 MCG) BY MOUTH DAILY  . lisinopril (PRINIVIL,ZESTRIL) 20 MG tablet Take 20 mg by mouth 2 (two) times daily.   . metoprolol tartrate (LOPRESSOR) 50 MG tablet TAKE 1 TABLET BY MOUTH TWICE DAILY (Patient taking differently: TAKE 1/2 TABLET BY MOUTH TWICE DAILY)  . Multiple Vitamin (MULTIVITAMIN) capsule Take 1 capsule by mouth daily.  . nortriptyline (PAMELOR) 25 MG capsule Take 4 capsule at bed time (Patient taking differently: Take 75 mg by mouth at bedtime. )  . Omega 3-6-9 CAPS Take by mouth daily.   . QUEtiapine (SEROQUEL) 100 MG tablet Take 200 mg by mouth at bedtime.  . VENTOLIN HFA 108 (90 Base) MCG/ACT inhaler INHALE 2 PUFFS INTO THE LUNGS EVERY 6 HOURS AS NEEDED  . KRILL OIL PO Take 1 capsule by mouth daily. Reported on 06/02/2015  . QUEtiapine (SEROQUEL) 25 MG tablet Take 50 mg by mouth at bedtime.    No facility-administered encounter medications on file as of 08/05/2018.     Activities of Daily Living In your present state of health, do you have any difficulty performing the following activities: 08/05/2018  Hearing? N  Vision? N  Comment Wears eye glasses daily.   Difficulty concentrating or making decisions? Y  Walking or climbing stairs? N  Dressing or bathing? N  Doing errands, shopping? N  Preparing Food and eating ? N  Using the Toilet? N  In the past six months, have you accidently leaked urine? Y  Comment Occasionally with pressure.   Do you have problems with loss of bowel control? N  Managing your Medications? N  Managing your Finances? N  Housekeeping or managing your Housekeeping? N  Some recent data might be  hidden    Patient Care Team: Virginia Crews, MD as PCP - General (Family Medicine) Barbette Merino, NP as Nurse Practitioner (Cardiology) Chauncey Mann, MD as Referring Physician (Psychiatry) Watt Climes, PA as Physician Assistant (Physician Assistant) Corey Skains, MD as Consulting Physician (Cardiology)    Assessment:   This is a routine wellness examination for Wanda Ochoa.  Exercise Activities and Dietary recommendations Current Exercise Habits: Home exercise routine, Type of exercise: walking;stretching, Time (Minutes): 60, Frequency (Times/Week): 5, Weekly Exercise (Minutes/Week): 300, Intensity: Mild, Exercise limited by: None identified  Goals    . DIET - INCREASE WATER INTAKE  Recommend increasing water intake to 4 glasses a day.     Marland Kitchen LIFESTYLE - DECREASE FALLS RISK     Recommend to remove any items from the home that may cause slips or trips.        Fall Risk: Fall Risk  08/05/2018 06/13/2017 05/14/2016 05/02/2015  Falls in the past year? 1 No Yes No  Number falls in past yr: 1 - 1 -  Injury with Fall? 0 - No -  Follow up Falls prevention discussed - - -    FALL RISK PREVENTION PERTAINING TO THE HOME:  Any stairs in or around the home? Yes  If so, are there any without handrails? Yes   Home free of loose throw rugs in walkways, pet beds, electrical cords, etc? Yes  Adequate lighting in your home to reduce risk of falls? Yes   ASSISTIVE DEVICES UTILIZED TO PREVENT FALLS:  Life alert? No  Use of a cane, walker or w/c? No  Grab bars in the bathroom? Yes  Shower chair or bench in shower? No  Elevated toilet seat or a handicapped toilet? No    TIMED UP AND GO:  Was the test performed? No .    Depression Screen PHQ 2/9 Scores 08/05/2018 06/13/2017 05/14/2016 05/02/2015  PHQ - 2 Score 0 0 2 0  PHQ- 9 Score - - 3 -     Cognitive Function     6CIT Screen 08/05/2018 06/13/2017  What Year? 0 points 0 points  What month? 0 points 0 points  What  time? 0 points 0 points  Count back from 20 0 points 0 points  Months in reverse 0 points 0 points  Repeat phrase 0 points 0 points  Total Score 0 0    Immunization History  Administered Date(s) Administered  . Influenza Split 01/16/2012  . Influenza, High Dose Seasonal PF 01/17/2016  . Influenza-Unspecified 12/23/2012, 12/05/2017  . Pneumococcal Conjugate-13 04/13/2014  . Pneumococcal Polysaccharide-23 01/16/2012  . Tdap 04/13/2014  . Zoster 01/29/2010    Qualifies for Shingles Vaccine? Yes  Zostavax completed 01/29/10. Due for Shingrix. Education has been provided regarding the importance of this vaccine. Pt has been advised to call insurance company to determine out of pocket expense. Advised may also receive vaccine at local pharmacy or Health Dept. Verbalized acceptance and understanding.  Tdap: Up to date  Flu Vaccine: Up to date  Pneumococcal Vaccine: Up to date  Screening Tests Health Maintenance  Topic Date Due  . COLONOSCOPY  04/09/2018  . INFLUENZA VACCINE  10/24/2018  . MAMMOGRAM  06/05/2019  . DEXA SCAN  07/24/2022  . TETANUS/TDAP  04/13/2024  . Hepatitis C Screening  Completed  . PNA vac Low Risk Adult  Completed    Cancer Screenings:  Colorectal Screening: Completed 04/09/13. Repeat every 5 years. Referral to GI placed today. Pt aware the office will call re: appt.  Mammogram: Completed 06/04/17. Pt requested an order for this year- ordered today.   Bone Density: Completed 07/23/17. Results reflect OSTEOPENIA. Repeat every 5 years.   Lung Cancer Screening: (Low Dose CT Chest recommended if Age 33-80 years, 30 pack-year currently smoking OR have quit w/in 15years.) does not qualify.   Additional Screening:  Hepatitis C Screening: Up to date  Vision Screening: Recommended annual ophthalmology exams for early detection of glaucoma and other disorders of the eye.  Dental Screening: Recommended annual dental exams for proper oral hygiene  Community  Resource Referral:  CRR required this visit?  No  Plan:  I have personally reviewed and addressed the Medicare Annual Wellness questionnaire and have noted the following in the patient's chart:  A. Medical and social history B. Use of alcohol, tobacco or illicit drugs  C. Current medications and supplements D. Functional ability and status E.  Nutritional status F.  Physical activity G. Advance directives H. List of other physicians I.  Hospitalizations, surgeries, and ER visits in previous 12 months J.  [place] such as hearing and vision if needed, cognitive and depression L. Referrals and appointments   In addition, I have reviewed and discussed with patient certain preventive protocols, quality metrics, and best practice recommendations. A written personalized care plan for preventive services as well as general preventive health recommendations were provided to patient. Nurse Health Advisor  Signed,    Jannet Calip [place], [place]  03/27/1115 Nurse Health Advisor   Nurse Notes: None.

## 2018-08-05 NOTE — Patient Instructions (Addendum)
Wanda Ochoa , Thank you for taking time to come for your Medicare Wellness Visit. I appreciate your ongoing commitment to your health goals. Please review the following plan we discussed and let me know if I can assist you in the future.   Screening recommendations/referrals: Colonoscopy: Referral to GI placed today. Pt aware the office will call re: appt. Mammogram: Ordered today. Pt aware she needs to call and set up apt. Bone Density: Up to date, due 07/2022 Recommended yearly ophthalmology/optometry visit for glaucoma screening and checkup Recommended yearly dental visit for hygiene and checkup  Vaccinations: Influenza vaccine: Up to date Pneumococcal vaccine: Completed series Tdap vaccine: Up to date, due 03/2024 Shingles vaccine: Pt declines today.     Advanced directives: Please bring a copy of your POA (Power of Attorney) and/or Living Will to your next appointment.   Conditions/risks identified: Fall risk prevention discussed today. Continue to increase water intake to 6-8 8 oz glasses a day.   Next appointment: 08/31/18 @ 1:20 PM with Dr Brita Romp. Declined scheduling an AWV for 2021 at this time.    Preventive Care 43 Years and Older, Female Preventive care refers to lifestyle choices and visits with your health care provider that can promote health and wellness. What does preventive care include?  A yearly physical exam. This is also called an annual well check.  Dental exams once or twice a year.  Routine eye exams. Ask your health care provider how often you should have your eyes checked.  Personal lifestyle choices, including:  Daily care of your teeth and gums.  Regular physical activity.  Eating a healthy diet.  Avoiding tobacco and drug use.  Limiting alcohol use.  Practicing safe sex.  Taking low-dose aspirin every day.  Taking vitamin and mineral supplements as recommended by your health care provider. What happens during an annual well check?  The services and screenings done by your health care provider during your annual well check will depend on your age, overall health, lifestyle risk factors, and family history of disease. Counseling  Your health care provider may ask you questions about your:  Alcohol use.  Tobacco use.  Drug use.  Emotional well-being.  Home and relationship well-being.  Sexual activity.  Eating habits.  History of falls.  Memory and ability to understand (cognition).  Work and work Statistician.  Reproductive health. Screening  You may have the following tests or measurements:  Height, weight, and BMI.  Blood pressure.  Lipid and cholesterol levels. These may be checked every 5 years, or more frequently if you are over 27 years old.  Skin check.  Lung cancer screening. You may have this screening every year starting at age 61 if you have a 30-pack-year history of smoking and currently smoke or have quit within the past 15 years.  Fecal occult blood test (FOBT) of the stool. You may have this test every year starting at age 68.  Flexible sigmoidoscopy or colonoscopy. You may have a sigmoidoscopy every 5 years or a colonoscopy every 10 years starting at age 40.  Hepatitis C blood test.  Hepatitis B blood test.  Sexually transmitted disease (STD) testing.  Diabetes screening. This is done by checking your blood sugar (glucose) after you have not eaten for a while (fasting). You may have this done every 1-3 years.  Bone density scan. This is done to screen for osteoporosis. You may have this done starting at age 35.  Mammogram. This may be done every 1-2 years. Talk to  your health care provider about how often you should have regular mammograms. Talk with your health care provider about your test results, treatment options, and if necessary, the need for more tests. Vaccines  Your health care provider may recommend certain vaccines, such as:  Influenza vaccine. This is  recommended every year.  Tetanus, diphtheria, and acellular pertussis (Tdap, Td) vaccine. You may need a Td booster every 10 years.  Zoster vaccine. You may need this after age 62.  Pneumococcal 13-valent conjugate (PCV13) vaccine. One dose is recommended after age 72.  Pneumococcal polysaccharide (PPSV23) vaccine. One dose is recommended after age 74. Talk to your health care provider about which screenings and vaccines you need and how often you need them. This information is not intended to replace advice given to you by your health care provider. Make sure you discuss any questions you have with your health care provider. Document Released: 04/07/2015 Document Revised: 11/29/2015 Document Reviewed: 01/10/2015 Elsevier Interactive Patient Education  2017 [place] Prevention in the Home Falls can cause injuries. They can happen to people of all ages. There are many things you can do to make your home safe and to help prevent falls. What can I do on the outside of my home?  Regularly fix the edges of walkways and driveways and fix any cracks.  Remove anything that might make you trip as you walk through a door, such as a raised step or threshold.  Trim any bushes or trees on the path to your home.  Use bright outdoor lighting.  Clear any walking paths of anything that might make someone trip, such as rocks or tools.  Regularly check to see if handrails are loose or broken. Make sure that both sides of any steps have handrails.  Any raised decks and porches should have guardrails on the edges.  Have any leaves, snow, or ice cleared regularly.  Use sand or salt on walking paths during winter.  Clean up any spills in your garage right away. This includes oil or grease spills. What can I do in the bathroom?  Use night lights.  Install grab bars by the toilet and in the tub and shower. Do not use towel bars as grab bars.  Use non-skid mats or decals in the tub or  shower.  If you need to sit down in the shower, use a plastic, non-slip stool.  Keep the floor dry. Clean up any water that spills on the floor as soon as it happens.  Remove soap buildup in the tub or shower regularly.  Attach bath mats securely with double-sided non-slip rug tape.  Do not have throw rugs and other things on the floor that can make you trip. What can I do in the bedroom?  Use night lights.  Make sure that you have a light by your bed that is easy to reach.  Do not use any sheets or blankets that are too big for your bed. They should not hang down onto the floor.  Have a firm chair that has side arms. You can use this for support while you get dressed.  Do not have throw rugs and other things on the floor that can make you trip. What can I do in the kitchen?  Clean up any spills right away.  Avoid walking on wet floors.  Keep items that you use a lot in easy-to-reach places.  If you need to reach something above you, use a strong step stool that has  a grab bar.  Keep electrical cords out of the way.  Do not use floor polish or wax that makes floors slippery. If you must use wax, use non-skid floor wax.  Do not have throw rugs and other things on the floor that can make you trip. What can I do with my stairs?  Do not leave any items on the stairs.  Make sure that there are handrails on both sides of the stairs and use them. Fix handrails that are broken or loose. Make sure that handrails are as long as the stairways.  Check any carpeting to make sure that it is firmly attached to the stairs. Fix any carpet that is loose or worn.  Avoid having throw rugs at the top or bottom of the stairs. If you do have throw rugs, attach them to the floor with carpet tape.  Make sure that you have a light switch at the top of the stairs and the bottom of the stairs. If you do not have them, ask someone to add them for you. What else can I do to help prevent falls?   Wear shoes that:  Do not have high heels.  Have rubber bottoms.  Are comfortable and fit you well.  Are closed at the toe. Do not wear sandals.  If you use a stepladder:  Make sure that it is fully opened. Do not climb a closed stepladder.  Make sure that both sides of the stepladder are locked into place.  Ask someone to hold it for you, if possible.  Clearly mark and make sure that you can see:  Any grab bars or handrails.  First and last steps.  Where the edge of each step is.  Use tools that help you move around (mobility aids) if they are needed. These include:  Canes.  Walkers.  Scooters.  Crutches.  Turn on the lights when you go into a dark area. Replace any light bulbs as soon as they burn out.  Set up your furniture so you have a clear path. Avoid moving your furniture around.  If any of your floors are uneven, fix them.  If there are any pets around you, be aware of where they are.  Review your medicines with your doctor. Some medicines can make you feel dizzy. This can increase your chance of falling. Ask your doctor what other things that you can do to help prevent falls. This information is not intended to replace advice given to you by your health care provider. Make sure you discuss any questions you have with your health care provider. Document Released: 01/05/2009 Document Revised: 08/17/2015 Document Reviewed: 04/15/2014 Elsevier Interactive Patient Education  2017 [employer].

## 2018-08-20 ENCOUNTER — Telehealth: Payer: Self-pay

## 2018-08-20 ENCOUNTER — Other Ambulatory Visit: Payer: Self-pay

## 2018-08-20 DIAGNOSIS — Z1211 Encounter for screening for malignant neoplasm of colon: Secondary | ICD-10-CM

## 2018-08-20 DIAGNOSIS — Z800 Family history of malignant neoplasm of digestive organs: Secondary | ICD-10-CM

## 2018-08-20 NOTE — Telephone Encounter (Signed)
Gastroenterology Pre-Procedure Review  Request Date: 09/29/18 Requesting Physician: Dr. Bonna Gains  PATIENT REVIEW QUESTIONS: The patient responded to the following health history questions as indicated:    1. Are you having any GI issues? no 2. Do you have a personal history of Polyps? no 3. Do you have a family history of Colon Cancer or Polyps? yes (mother colon cancer) 4. Diabetes Mellitus? no 5. Joint replacements in the past 12 months?no 6. Major health problems in the past 3 months?no 7. Any artificial heart valves, MVP, or defibrillator?no    MEDICATIONS & ALLERGIES:    Patient reports the following regarding taking any anticoagulation/antiplatelet therapy:   Plavix, Coumadin, Eliquis, Xarelto, Lovenox, Pradaxa, Brilinta, or Effient? no Aspirin? yes (81 mg daily)  Patient confirms/reports the following medications:  Current Outpatient Medications  Medication Sig Dispense Refill  . amLODipine (NORVASC) 5 MG tablet Take 5 mg by mouth 2 (two) times daily.     Marland Kitchen aspirin 81 MG tablet Take 81 mg by mouth daily.    Marland Kitchen KRILL OIL PO Take 1 capsule by mouth daily. Reported on 06/02/2015    . levothyroxine (SYNTHROID) 112 MCG tablet TAKE 1 TABLET(112 MCG) BY MOUTH DAILY 90 tablet 0  . lisinopril (PRINIVIL,ZESTRIL) 20 MG tablet Take 20 mg by mouth 2 (two) times daily.     . metoprolol tartrate (LOPRESSOR) 50 MG tablet TAKE 1 TABLET BY MOUTH TWICE DAILY (Patient taking differently: TAKE 1/2 TABLET BY MOUTH TWICE DAILY) 180 tablet 2  . Multiple Vitamin (MULTIVITAMIN) capsule Take 1 capsule by mouth daily.    . nortriptyline (PAMELOR) 25 MG capsule Take 4 capsule at bed time (Patient taking differently: Take 75 mg by mouth at bedtime. ) 120 capsule 0  . Omega 3-6-9 CAPS Take by mouth daily.     . QUEtiapine (SEROQUEL) 100 MG tablet Take 200 mg by mouth at bedtime.    Marland Kitchen QUEtiapine (SEROQUEL) 25 MG tablet Take 50 mg by mouth at bedtime.     . VENTOLIN HFA 108 (90 Base) MCG/ACT inhaler INHALE 2  PUFFS INTO THE LUNGS EVERY 6 HOURS AS NEEDED 18 g 4   No current facility-administered medications for this visit.     Patient confirms/reports the following allergies:  Allergies  Allergen Reactions  . Penicillins Anaphylaxis    No orders of the defined types were placed in this encounter.   AUTHORIZATION INFORMATION Primary Insurance: 1D#: Group #:  Secondary Insurance: 1D#: Group #:  SCHEDULE INFORMATION: Date: [facility] Time: Location:09/29/18

## 2018-08-24 ENCOUNTER — Other Ambulatory Visit: Payer: Self-pay

## 2018-08-24 DIAGNOSIS — I10 Essential (primary) hypertension: Secondary | ICD-10-CM

## 2018-08-24 MED ORDER — METOPROLOL TARTRATE 50 MG PO TABS: 50.0000 mg | ORAL_TABLET | Freq: Two times a day (BID) | ORAL | 2 refills | Status: DC

## 2018-08-31 ENCOUNTER — Encounter: Payer: Medicare Other | Admitting: Family Medicine

## 2018-08-31 NOTE — Progress Notes (Deleted)
Patient: Wanda Ochoa, Female    DOB: July 09, 1945, 73 y.o.   MRN: [id number] Visit Date: 08/31/2018  Today's Provider: Lavon Paganini, MD   No chief complaint on file.  Subjective:     Annual physical exam Wanda Ochoa is a 73 y.o. female who presents today for health maintenance and complete physical. She feels {DESC; WELL/FAIRLY WELL/POORLY:18703}. She reports exercising ***. She reports she is sleeping {DESC; WELL/FAIRLY WELL/POORLY:18703}.  -----------------------------------------------------------------   Review of Systems  Constitutional: Negative.   HENT: Negative.   Eyes: Negative.   Respiratory: Negative.   Cardiovascular: Negative.   Gastrointestinal: Negative.   Endocrine: Negative.   Genitourinary: Negative.   Musculoskeletal: Negative.   Skin: Negative.   Allergic/Immunologic: Negative.   Neurological: Negative.   Hematological: Negative.   Psychiatric/Behavioral: Negative.     Social History      She  reports that she has never smoked. She has never used smokeless tobacco. She reports that she does not drink alcohol or use drugs.       Social History   Socioeconomic History  . Marital status: Married    Spouse name: Avanni Turnbaugh  . Number of children: 3  . Years of education: 16  . Highest education level: Bachelor's degree (e.g., BA, AB, BS)  Occupational History  . 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 Financial resource strain: Not hard at all  . Food insecurity:    Worry: Never true    Inability: Never true  . Transportation needs:    Medical: No    Non-medical: No  Tobacco Use  . Smoking status: Never Smoker  . Smokeless tobacco: Never Used  Substance and Sexual Activity  . Alcohol use: No  . Drug use: No  . Sexual activity: Not on file  Lifestyle  . Physical activity:    Days per week: 0 days    Minutes per session: 0 min  .  Stress: Rather much  Relationships  . Social connections:    Talks on phone: Patient refused    Gets together: Patient refused    Attends religious service: Patient refused    Active member of club or organization: Patient refused    Attends meetings of clubs or organizations: Patient refused    Relationship status: Patient refused  Other Topics Concern  . Not on file  Social History Narrative  . Not on file    Past Medical History:  Diagnosis Date  . Anxiety   . Arthritis    right knee  . Asthma   . HTN (hypertension)   . Thyroid disease      Patient Active Problem List   Diagnosis Date Noted  . Effusion of right knee 06/18/2017  . Hematuria 09/11/2016  . Severe recurrent major depression without psychotic features ([place])   . Hypercholesteremia 05/02/2015  . Mitral valve prolapse 02/21/2015  . Allergic rhinitis 12/24/2014  . Osteopenia 12/24/2014  . Hypothyroidism 11/24/2014  . Generalized anxiety disorder 02/24/2012  . Nocturnal hypoxemia 01/20/2012  . Abdominal mass 01/20/2012  . Temporary cerebral vascular dysfunction 01/02/2009  . Benign hypertension 03/28/2008  . Asthma, exogenous 10/22/2007  . Adaptive colitis 03/12/2003    Past Surgical History:  Procedure Laterality Date  . ABDOMINAL HYSTERECTOMY  1990  . BLADDER SUSPENSION  1990  . BREAST CYST ASPIRATION Right 1985  . BREAST EXCISIONAL BIOPSY Right 1967   EXCISIONAL  BX - NEG  . BREAST SURGERY Right 1967   Lumpectomy  . CESAREAN SECTION    . [place]    Family History        Family Status  Relation Name Status  . Father  Deceased at age 58's  . Sister  Alive  . Son  Alive  . Mother  Deceased at age 17  . Daughter  Alive  . Son  Alive  . Brother  Alive  . Sister  Alive  . Mat Uncle  (Not Specified)  . MGF  (Not Specified)  . MGM  (Not Specified)  . PGM  (Not Specified)  . Other niece Alive        Her family history includes Allergies in her  brother and sister; Anxiety disorder in her mother; Asthma in her maternal uncle; Autism in her son; Breast cancer in her paternal grandmother and another family member; Breast cancer (age of onset: 77) in her sister; Colon cancer in her mother; Depression in her father and sister; Heart disease in her maternal grandfather, maternal grandmother, and mother; Hypertension in her mother; Mental illness in her daughter; Suicidality in her father.      Allergies  Allergen Reactions  . Penicillins Anaphylaxis     Current Outpatient Medications:  .  amLODipine (NORVASC) 5 MG tablet, Take 5 mg by mouth 2 (two) times daily. , Disp: , Rfl:  .  aspirin 81 MG tablet, Take 81 mg by mouth daily., Disp: , Rfl:  .  KRILL OIL PO, Take 1 capsule by mouth daily. Reported on 06/02/2015, Disp: , Rfl:  .  levothyroxine (SYNTHROID) 112 MCG tablet, TAKE 1 TABLET(112 MCG) BY MOUTH DAILY, Disp: 90 tablet, Rfl: 0 .  lisinopril (PRINIVIL,ZESTRIL) 20 MG tablet, Take 20 mg by mouth 2 (two) times daily. , Disp: , Rfl:  .  metoprolol tartrate (LOPRESSOR) 50 MG tablet, Take 1 tablet (50 mg total) by mouth 2 (two) times daily., Disp: 180 tablet, Rfl: 2 .  Multiple Vitamin (MULTIVITAMIN) capsule, Take 1 capsule by mouth daily., Disp: , Rfl:  .  nortriptyline (PAMELOR) 25 MG capsule, Take 4 capsule at bed time (Patient taking differently: Take 75 mg by mouth at bedtime. ), Disp: 120 capsule, Rfl: 0 .  Omega 3-6-9 CAPS, Take by mouth daily. , Disp: , Rfl:  .  QUEtiapine (SEROQUEL) 100 MG tablet, Take 200 mg by mouth at bedtime., Disp: , Rfl:  .  QUEtiapine (SEROQUEL) 25 MG tablet, Take 50 mg by mouth at bedtime. , Disp: , Rfl:  .  VENTOLIN HFA 108 (90 Base) MCG/ACT inhaler, INHALE 2 PUFFS INTO THE LUNGS EVERY 6 HOURS AS NEEDED, Disp: 18 g, Rfl: 4   Patient Care Team: Virginia Crews, MD as PCP - General (Family Medicine) Barbette Merino, NP as Nurse Practitioner (Cardiology) Chauncey Mann, MD as Referring Physician  (Psychiatry) Watt Climes, PA as Physician Assistant (Physician Assistant) Corey Skains, MD as Consulting Physician (Cardiology)    Objective:    Vitals: There were no vitals taken for this visit.  There were no vitals filed for this visit.   Physical Exam   Depression Screen PHQ 2/9 Scores 08/05/2018 06/13/2017 05/14/2016 05/02/2015  PHQ - 2 Score 0 0 2 0  PHQ- 9 Score - - 3 -       Assessment & Plan:     Routine Health Maintenance and Physical Exam  Exercise Activities and Dietary recommendations Goals    .  DIET - INCREASE WATER INTAKE     Recommend increasing water intake to 4 glasses a day.     [place] LIFESTYLE - DECREASE FALLS RISK     Recommend to remove any items from the home that may cause slips or trips.        Immunization History  Administered Date(s) Administered  . Influenza Split 01/16/2012  . Influenza, High Dose Seasonal PF 01/17/2016  . Influenza-Unspecified 12/23/2012, 12/05/2017  . Pneumococcal Conjugate-13 04/13/2014  . Pneumococcal Polysaccharide-23 01/16/2012  . Tdap 04/13/2014  . Zoster 01/29/2010    Health Maintenance  Topic Date Due  . COLONOSCOPY  04/09/2018  . INFLUENZA VACCINE  10/24/2018  . MAMMOGRAM  06/05/2019  . DEXA SCAN  07/24/2022  . TETANUS/TDAP  04/13/2024  . Hepatitis C Screening  Completed  . PNA vac Low Risk Adult  Completed     Discussed health benefits of physical activity, and encouraged her to engage in regular exercise appropriate for her age and condition.    --------------------------------------------------------------------    Lavon Paganini, MD  [place]

## 2018-09-01 ENCOUNTER — Telehealth: Payer: Self-pay | Admitting: Family Medicine

## 2018-09-01 NOTE — Telephone Encounter (Signed)
Pt called saying she missed her CPe on 6/9 and wants to schd asap.  I do not see any CPE's appts available any time soon.  Do you want to work her in somewhere?  Please advise  Thanks teri

## 2018-09-02 NOTE — Telephone Encounter (Signed)
Patient scheduled for a physical at 10:00 on 12/07/2018

## 2018-09-23 ENCOUNTER — Telehealth: Payer: Self-pay | Admitting: Gastroenterology

## 2018-09-23 ENCOUNTER — Other Ambulatory Visit: Payer: Self-pay

## 2018-09-23 DIAGNOSIS — Z1211 Encounter for screening for malignant neoplasm of colon: Secondary | ICD-10-CM

## 2018-09-23 NOTE — Telephone Encounter (Signed)
Pt left vm she states she is scheduled for a colonoscopy for next Tuesday and she normally goes to [facility] she is not sure how  She got connected to [place] please call pt she wants to cancel the apt

## 2018-09-23 NOTE — Telephone Encounter (Signed)
Colonoscopy and COVID testing has been canceled.  Patient states that she normally has her colonoscopy with Dr.Skulskie at [facility]. Jocelyn Lamer in Endoscopy has been informed.  Thanks [employer]

## 2018-09-25 ENCOUNTER — Other Ambulatory Visit: Admission: RE | Admit: 2018-09-25 | Payer: Medicare Other | Source: Ambulatory Visit

## 2018-09-29 ENCOUNTER — Ambulatory Visit: Admission: RE | Admit: 2018-09-29 | Payer: Medicare Other | Source: Home / Self Care | Admitting: Gastroenterology

## 2018-09-29 ENCOUNTER — Encounter: Admission: RE | Payer: Self-pay | Source: Home / Self Care

## 2018-09-29 SURGERY — COLONOSCOPY WITH PROPOFOL
Anesthesia: General

## 2018-09-30 DIAGNOSIS — F331 Major depressive disorder, recurrent, moderate: Secondary | ICD-10-CM | POA: Diagnosis not present

## 2018-09-30 DIAGNOSIS — F411 Generalized anxiety disorder: Secondary | ICD-10-CM | POA: Diagnosis not present

## 2018-09-30 DIAGNOSIS — F5105 Insomnia due to other mental disorder: Secondary | ICD-10-CM | POA: Diagnosis not present

## 2018-10-05 DIAGNOSIS — K5909 Other constipation: Secondary | ICD-10-CM | POA: Diagnosis not present

## 2018-10-05 DIAGNOSIS — Z86010 Personal history of colonic polyps: Secondary | ICD-10-CM | POA: Diagnosis not present

## 2018-10-07 ENCOUNTER — Other Ambulatory Visit: Payer: Self-pay

## 2018-10-07 ENCOUNTER — Ambulatory Visit
Admission: RE | Admit: 2018-10-07 | Discharge: 2018-10-07 | Disposition: A | Discharge status: Home / Self Care | Payer: Medicare Other | Source: Ambulatory Visit | Attending: Family Medicine | Admitting: Family Medicine

## 2018-10-07 DIAGNOSIS — Z1231 Encounter for screening mammogram for malignant neoplasm of breast: Secondary | ICD-10-CM

## 2018-10-08 ENCOUNTER — Telehealth: Payer: Self-pay

## 2018-10-08 NOTE — Telephone Encounter (Signed)
-----   Message from Virginia Crews, MD sent at 10/08/2018  8:11 AM EDT ----- Normal mammogram. Repeat in 1 yr

## 2018-10-08 NOTE — Telephone Encounter (Signed)
Pt advised.   Thanks,   -Laura  

## 2018-10-21 DIAGNOSIS — F411 Generalized anxiety disorder: Secondary | ICD-10-CM | POA: Diagnosis not present

## 2018-10-21 DIAGNOSIS — F331 Major depressive disorder, recurrent, moderate: Secondary | ICD-10-CM | POA: Diagnosis not present

## 2018-10-30 ENCOUNTER — Other Ambulatory Visit: Admission: RE | Admit: 2018-10-30 | Payer: Medicare Other | Source: Ambulatory Visit

## 2018-11-02 ENCOUNTER — Other Ambulatory Visit: Payer: Self-pay | Admitting: Family Medicine

## 2018-11-02 DIAGNOSIS — E039 Hypothyroidism, unspecified: Secondary | ICD-10-CM

## 2018-11-02 MED ORDER — LEVOTHYROXINE SODIUM 112 MCG PO TABS: ORAL_TABLET | ORAL | 3 refills | Status: DC

## 2018-11-02 NOTE — Telephone Encounter (Signed)
[facility] faxed refill request for the following medications:   levothyroxine (SYNTHROID) 112 MCG tablet Please advise.  

## 2018-11-25 DIAGNOSIS — F331 Major depressive disorder, recurrent, moderate: Secondary | ICD-10-CM | POA: Diagnosis not present

## 2018-11-25 DIAGNOSIS — F5105 Insomnia due to other mental disorder: Secondary | ICD-10-CM | POA: Diagnosis not present

## 2018-11-25 DIAGNOSIS — F411 Generalized anxiety disorder: Secondary | ICD-10-CM | POA: Diagnosis not present

## 2018-12-07 ENCOUNTER — Encounter: Payer: Self-pay | Admitting: Family Medicine

## 2018-12-07 ENCOUNTER — Other Ambulatory Visit: Payer: Self-pay

## 2018-12-07 ENCOUNTER — Ambulatory Visit (INDEPENDENT_AMBULATORY_CARE_PROVIDER_SITE_OTHER): Payer: Medicare Other | Admitting: Family Medicine

## 2018-12-07 VITALS — BP 150/75 | HR 82 | Temp 97.1°F | Wt 186.4 lb

## 2018-12-07 DIAGNOSIS — E7800 Pure hypercholesterolemia, unspecified: Secondary | ICD-10-CM | POA: Diagnosis not present

## 2018-12-07 DIAGNOSIS — I10 Essential (primary) hypertension: Secondary | ICD-10-CM

## 2018-12-07 DIAGNOSIS — E039 Hypothyroidism, unspecified: Secondary | ICD-10-CM | POA: Diagnosis not present

## 2018-12-07 DIAGNOSIS — Z79899 Other long term (current) drug therapy: Secondary | ICD-10-CM | POA: Diagnosis not present

## 2018-12-07 DIAGNOSIS — F332 Major depressive disorder, recurrent severe without psychotic features: Secondary | ICD-10-CM

## 2018-12-07 DIAGNOSIS — M10071 Idiopathic gout, right ankle and foot: Secondary | ICD-10-CM

## 2018-12-07 MED ORDER — HYDROCHLOROTHIAZIDE 12.5 MG PO TABS: 12.5000 mg | ORAL_TABLET | Freq: Every day | ORAL | 3 refills | Status: DC

## 2018-12-07 NOTE — Assessment & Plan Note (Signed)
Not currently on a statin Goal LDL less than 130 Recheck FLP and CMP Calculate ASCVD risk and determine need for statin for primary prevention

## 2018-12-07 NOTE — Assessment & Plan Note (Signed)
Well-controlled Followed by psychiatry Check CBC, lipid panel, A1c given Seroquel use

## 2018-12-07 NOTE — Patient Instructions (Addendum)
 The CDC recommends two doses of Shingrix (the shingles vaccine) separated by 2 to 6 months for adults age 72 years and older. I recommend checking with your insurance plan regarding coverage for this vaccine.     Preventive Care 65 Years and Older, Female Preventive care refers to lifestyle choices and visits with your health care provider that can promote health and wellness. This includes:  A yearly physical exam. This is also called an annual well check.  Regular dental and eye exams.  Immunizations.  Screening for certain conditions.  Healthy lifestyle choices, such as diet and exercise. What can I expect for my preventive care visit? Physical exam Your health care provider will check:  Height and weight. These may be used to calculate body mass index (BMI), which is a measurement that tells if you are at a healthy weight.  Heart rate and blood pressure.  Your skin for abnormal spots. Counseling Your health care provider may ask you questions about:  Alcohol, tobacco, and drug use.  Emotional well-being.  Home and relationship well-being.  Sexual activity.  Eating habits.  History of falls.  Memory and ability to understand (cognition).  Work and work environment.  Pregnancy and menstrual history. What immunizations do I need?  Influenza (flu) vaccine  This is recommended every year. Tetanus, diphtheria, and pertussis (Tdap) vaccine  You may need a Td booster every 10 years. Varicella (chickenpox) vaccine  You may need this vaccine if you have not already been vaccinated. Zoster (shingles) vaccine  You may need this after age 60. Pneumococcal conjugate (PCV13) vaccine  One dose is recommended after age 65. Pneumococcal polysaccharide (PPSV23) vaccine  One dose is recommended after age 65. Measles, mumps, and rubella (MMR) vaccine  You may need at least one dose of MMR if you were born in 1957 or later. You may also need a second  dose. Meningococcal conjugate (MenACWY) vaccine  You may need this if you have certain conditions. Hepatitis A vaccine  You may need this if you have certain conditions or if you travel or work in places where you may be exposed to hepatitis A. Hepatitis B vaccine  You may need this if you have certain conditions or if you travel or work in places where you may be exposed to hepatitis B. Haemophilus influenzae type b (Hib) vaccine  You may need this if you have certain conditions. You may receive vaccines as individual doses or as more than one vaccine together in one shot (combination vaccines). Talk with your health care provider about the risks and benefits of combination vaccines. What tests do I need? Blood tests  Lipid and cholesterol levels. These may be checked every 5 years, or more frequently depending on your overall health.  Hepatitis C test.  Hepatitis B test. Screening  Lung cancer screening. You may have this screening every year starting at age 55 if you have a 30-pack-year history of smoking and currently smoke or have quit within the past 15 years.  Colorectal cancer screening. All adults should have this screening starting at age 72 and continuing until age 75. Your health care provider may recommend screening at age 45 if you are at increased risk. You will have tests every 1-10 years, depending on your results and the type of screening test.  Diabetes screening. This is done by checking your blood sugar (glucose) after you have not eaten for a while (fasting). You may have this done every 1-3 years.  Mammogram. This may   be done every 1-2 years. Talk with your health care provider about how often you should have regular mammograms.  BRCA-related cancer screening. This may be done if you have a family history of breast, ovarian, tubal, or peritoneal cancers. Other tests  Sexually transmitted disease (STD) testing.  Bone density scan. This is done to screen for  osteoporosis. You may have this done starting at age 65. Follow these instructions at home: Eating and drinking  Eat a diet that includes fresh fruits and vegetables, whole grains, lean protein, and low-fat dairy products. Limit your intake of foods with high amounts of sugar, saturated fats, and salt.  Take vitamin and mineral supplements as recommended by your health care provider.  Do not drink alcohol if your health care provider tells you not to drink.  If you drink alcohol: ? Limit how much you have to 0-1 drink a day. ? Be aware of how much alcohol is in your drink. In the [place] one drink equals one 12 oz bottle of beer (355 mL), one 5 oz glass of wine (148 mL), or one 1 oz glass of hard liquor (44 mL). Lifestyle  Take daily care of your teeth and gums.  Stay active. Exercise for at least 30 minutes on 5 or more days each week.  Do not use any products that contain nicotine or tobacco, such as cigarettes, e-cigarettes, and chewing tobacco. If you need help quitting, ask your health care provider.  If you are sexually active, practice safe sex. Use a condom or other form of protection in order to prevent STIs (sexually transmitted infections).  Talk with your health care provider about taking a low-dose aspirin or statin. What's next?  Go to your health care provider once a year for a well check visit.  Ask your health care provider how often you should have your eyes and teeth checked.  Stay up to date on all vaccines. This information is not intended to replace advice given to you by your health care provider. Make sure you discuss any questions you have with your health care provider. Document Released: 04/07/2015 Document Revised: 03/05/2018 Document Reviewed: 03/05/2018 Elsevier Patient Education  2020 [employer].

## 2018-12-07 NOTE — Progress Notes (Signed)
Patient: Wanda Ochoa Female    DOB: 1945-11-17   73 y.o.   MRN: [id number] Visit Date: 12/07/2018  Today's Provider: Lavon Paganini, MD   Chief Complaint  Patient presents with  . Hypertension  . Hypothyroidism  . Hyperlipidemia   Subjective:    I, Porsha McClurkin CMA, am acting as a scribe for Lavon Paganini, MD.  AWE was on 08/05/2018 w/[place] HPI  Hypertension, follow-up:  BP Readings from Last 3 Encounters:  12/07/18 (!) 160/83  08/03/18 132/71  06/18/17 (!) 152/76    She was last seen for hypertension 4 months ago.  BP at that visit was 132/71. Management changes since that visit include none. She reports good compliance with treatment. She is not having side effects.  She is exercising. She is adherent to low salt diet.   Outside blood pressures are 140's/80's. She is experiencing none.  Patient denies chest pain, chest pressure/discomfort, exertional chest pressure/discomfort, fatigue, irregular heart beat and lower extremity edema.   Cardiovascular risk factors include advanced age (older than 25 for men, 24 for women) and hypertension.  Use of agents associated with hypertension: none.     Weight trend: stable Wt Readings from Last 3 Encounters:  12/07/18 186 lb 6.4 oz (84.6 kg)  06/18/17 188 lb (85.3 kg)  06/13/17 188 lb (85.3 kg)    Current diet: well balanced  ------------------------------------------------------------------------   Hypothyroid, follow-up:  TSH  Date Value Ref Range Status  06/18/2017 1.010 0.450 - 4.500 uIU/mL Final  05/16/2016 3.270 0.450 - 4.500 uIU/mL Final  12/06/2014 1.300 0.450 - 4.500 uIU/mL Final   Wt Readings from Last 3 Encounters:  12/07/18 186 lb 6.4 oz (84.6 kg)  06/18/17 188 lb (85.3 kg)  06/13/17 188 lb (85.3 kg)    She was last seen for hypothyroid 4 months ago.  Management since that visit includes none. She reports good compliance with treatment. She is not having side effects.   She is exercising. She is experiencing none She denies change in energy level, diarrhea, heat / cold intolerance, nervousness, palpitations and weight changes Weight trend: stable  ------------------------------------------------------------------------   Lipid/Cholesterol, Follow-up:   Last seen for this4 months ago.  Management changes since that visit include none. . Last Lipid Panel:    Component Value Date/Time   CHOL 199 06/18/2017 1048   TRIG 138 06/18/2017 1048   HDL 59 06/18/2017 1048   CHOLHDL 3.4 06/18/2017 1048   LDLCALC 112 (H) 06/18/2017 1048    Risk factors for vascular disease include hypercholesterolemia and hypertension  She reports good compliance with treatment. She is not having side effects.  Current symptoms include none and have been stable. Weight trend: stable Prior visit with dietician: no Current diet: well balanced Current exercise: gardening and housecleaning  Wt Readings from Last 3 Encounters:  12/07/18 186 lb 6.4 oz (84.6 kg)  06/18/17 188 lb (85.3 kg)  06/13/17 188 lb (85.3 kg)    -------------------------------------------------------------------   A few months ago R big toe swelled up, got red and tender.  She has never had this happen before.  She read about gout and feels like this is similar.  Denies any symptoms currently    Allergies  Allergen Reactions  . Penicillins Anaphylaxis     Current Outpatient Medications:  .  amLODipine (NORVASC) 5 MG tablet, Take 5 mg by mouth 2 (two) times daily. , Disp: , Rfl:  .  aspirin 81 MG tablet, Take 81 mg by mouth  daily., Disp: , Rfl:  .  KRILL OIL PO, Take 1 capsule by mouth daily. Reported on 06/02/2015, Disp: , Rfl:  .  levothyroxine (SYNTHROID) 112 MCG tablet, TAKE 1 TABLET(112 MCG) BY MOUTH DAILY, Disp: 90 tablet, Rfl: 3 .  lisinopril (PRINIVIL,ZESTRIL) 20 MG tablet, Take 20 mg by mouth 2 (two) times daily. , Disp: , Rfl:  .  metoprolol tartrate (LOPRESSOR) 50 MG tablet, Take  1 tablet (50 mg total) by mouth 2 (two) times daily., Disp: 180 tablet, Rfl: 2 .  Multiple Vitamin (MULTIVITAMIN) capsule, Take 1 capsule by mouth daily., Disp: , Rfl:  .  nortriptyline (PAMELOR) 25 MG capsule, Take 4 capsule at bed time (Patient taking differently: Take 75 mg by mouth at bedtime. ), Disp: 120 capsule, Rfl: 0 .  Omega 3-6-9 CAPS, Take by mouth daily. , Disp: , Rfl:  .  QUEtiapine (SEROQUEL) 100 MG tablet, Take 200 mg by mouth at bedtime., Disp: , Rfl:  .  VENTOLIN HFA 108 (90 Base) MCG/ACT inhaler, INHALE 2 PUFFS INTO THE LUNGS EVERY 6 HOURS AS NEEDED, Disp: 18 g, Rfl: 4 .  QUEtiapine (SEROQUEL) 25 MG tablet, Take 50 mg by mouth at bedtime. , Disp: , Rfl:   Review of Systems  Constitutional: Negative.   HENT: Positive for tinnitus.   Eyes: Positive for itching.  Respiratory: Negative.   Cardiovascular: Negative.   Gastrointestinal: Negative.   Endocrine: Negative.   Genitourinary: Negative.   Musculoskeletal: Positive for arthralgias.  Skin: Negative.   Allergic/Immunologic: Negative.   Neurological: Negative.   Hematological: Negative.   Psychiatric/Behavioral: Positive for sleep disturbance.    Social History   Tobacco Use  . Smoking status: Never Smoker  . Smokeless tobacco: Never Used  Substance Use Topics  . Alcohol use: No      Objective:   BP (!) 160/83 (BP Location: Left Arm, Patient Position: Sitting, Cuff Size: Normal)   Pulse 82   Temp (!) 97.1 F (36.2 C) (Temporal)   Wt 186 lb 6.4 oz (84.6 kg)   SpO2 95%   BMI 27.53 kg/m  Vitals:   12/07/18 1009  BP: (!) 160/83  Pulse: 82  Temp: (!) 97.1 F (36.2 C)  TempSrc: Temporal  SpO2: 95%  Weight: 186 lb 6.4 oz (84.6 kg)  Body mass index is 27.53 kg/m.   Physical Exam Vitals signs reviewed.  Constitutional:      General: She is not in acute distress.    Appearance: Normal appearance. She is well-developed. She is not diaphoretic.  HENT:     Head: Normocephalic and atraumatic.      Right Ear: Tympanic membrane, ear canal and external ear normal.     Left Ear: Tympanic membrane, ear canal and external ear normal.  Eyes:     General: No scleral icterus.    Conjunctiva/sclera: Conjunctivae normal.     Pupils: Pupils are equal, round, and reactive to light.  Neck:     Musculoskeletal: Neck supple.     Thyroid: No thyromegaly.  Cardiovascular:     Rate and Rhythm: Normal rate and regular rhythm.     Pulses: Normal pulses.     Heart sounds: Normal heart sounds. No murmur.  Pulmonary:     Effort: Pulmonary effort is normal. No respiratory distress.     Breath sounds: Normal breath sounds. No wheezing or rales.  Abdominal:     General: There is no distension.     Palpations: Abdomen is soft.     Tenderness:  There is no abdominal tenderness.  Musculoskeletal:        General: No deformity.     Right lower leg: No edema.     Left lower leg: No edema.  Lymphadenopathy:     Cervical: No cervical adenopathy.  Skin:    General: Skin is warm and dry.     Capillary Refill: Capillary refill takes less than 2 seconds.     Findings: No rash.  Neurological:     Mental Status: She is alert and oriented to person, place, and time. Mental status is at baseline.  Psychiatric:        Mood and Affect: Mood normal.        Behavior: Behavior normal.        Thought Content: Thought content normal.      No results found for any visits on 12/07/18.     Assessment & Plan   Problem List Items Addressed This Visit      Cardiovascular and Mediastinum   Benign hypertension    Uncontrolled Continue current meds Add HCTZ 12.5 mg daily Recheck metabolic panel Follow-up in about 2 months and consider dose titration Recheck metabolic panel at next visit Also followed by cardiology      Relevant Medications   hydrochlorothiazide (HYDRODIURIL) 12.5 MG tablet   Other Relevant Orders   Comprehensive metabolic panel     Endocrine   Hypothyroidism    Previously well controlled  Continue Synthroid at current dose  Recheck TSH and adjust Synthroid as indicated        Relevant Orders   TSH     Musculoskeletal and Integument   Idiopathic gout involving toe of right foot    History consistent with gout flare a few months ago No symptoms currently Check uric acid level while asymptomatic If grossly elevated, could consider uric acid lowering therapy Discussed that if she gets another episode, she should be seen that day to start treatment as well as consider uric acid lowering therapy      Relevant Orders   Uric acid     Other   Hypercholesteremia - Primary    Not currently on a statin Goal LDL less than 130 Recheck FLP and CMP Calculate ASCVD risk and determine need for statin for primary prevention      Relevant Medications   hydrochlorothiazide (HYDRODIURIL) 12.5 MG tablet   Other Relevant Orders   Lipid panel   Comprehensive metabolic panel   Severe recurrent major depression without psychotic features ([facility])    Well-controlled Followed by psychiatry Check CBC, lipid panel, A1c given Seroquel use       Other Visit Diagnoses    Long-term use of high-risk medication       Relevant Orders   Lipid panel   CBC With Differential   Hemoglobin A1c       Return in about 2 months (around 02/06/2019) for BP f/u.   The entirety of the information documented in the History of Present Illness, Review of Systems and Physical Exam were personally obtained by me. Portions of this information were initially documented by [facility], CMA and reviewed by me for thoroughness and accuracy.    Bacigalupo, Dionne Bucy, MD MPH [facility]

## 2018-12-07 NOTE — Assessment & Plan Note (Signed)
Uncontrolled Continue current meds Add HCTZ 12.5 mg daily Recheck metabolic panel Follow-up in about 2 months and consider dose titration Recheck metabolic panel at next visit Also followed by cardiology

## 2018-12-07 NOTE — Assessment & Plan Note (Signed)
History consistent with gout flare a few months ago No symptoms currently Check uric acid level while asymptomatic If grossly elevated, could consider uric acid lowering therapy Discussed that if she gets another episode, she should be seen that day to start treatment as well as consider uric acid lowering therapy

## 2018-12-07 NOTE — Assessment & Plan Note (Signed)
Previously well controlled Continue Synthroid at current dose  Recheck TSH and adjust Synthroid as indicated   

## 2018-12-08 LAB — CBC WITH DIFFERENTIAL
Basophils Absolute: 0.1 10*3/uL (ref 0.0–0.2)
Basos: 1 %
EOS (ABSOLUTE): 0.1 10*3/uL (ref 0.0–0.4)
Eos: 2 %
Hematocrit: 34.8 % (ref 34.0–46.6)
Hemoglobin: 12 g/dL (ref 11.1–15.9)
Immature Grans (Abs): 0 10*3/uL (ref 0.0–0.1)
Immature Granulocytes: 0 %
Lymphocytes Absolute: 2 10*3/uL (ref 0.7–3.1)
Lymphs: 35 %
MCH: 33.1 pg — ABNORMAL HIGH (ref 26.6–33.0)
MCHC: 34.5 g/dL (ref 31.5–35.7)
MCV: 96 fL (ref 79–97)
Monocytes Absolute: 0.5 10*3/uL (ref 0.1–0.9)
Monocytes: 9 %
Neutrophils Absolute: 3 10*3/uL (ref 1.4–7.0)
Neutrophils: 53 %
RBC: 3.63 x10E6/uL — ABNORMAL LOW (ref 3.77–5.28)
RDW: 11.9 % (ref 11.7–15.4)
WBC: 5.7 10*3/uL (ref 3.4–10.8)

## 2018-12-08 LAB — COMPREHENSIVE METABOLIC PANEL
ALT: 18 IU/L (ref 0–32)
AST: 28 IU/L (ref 0–40)
Albumin/Globulin Ratio: 1.3 (ref 1.2–2.2)
Albumin: 4.1 g/dL (ref 3.7–4.7)
Alkaline Phosphatase: 130 IU/L — ABNORMAL HIGH (ref 39–117)
BUN/Creatinine Ratio: 10 — ABNORMAL LOW (ref 12–28)
BUN: 10 mg/dL (ref 8–27)
Bilirubin Total: 0.4 mg/dL (ref 0.0–1.2)
CO2: 27 mmol/L (ref 20–29)
Calcium: 9.2 mg/dL (ref 8.7–10.3)
Chloride: 97 mmol/L (ref 96–106)
Creatinine, Ser: 1.02 mg/dL — ABNORMAL HIGH (ref 0.57–1.00)
GFR calc Af Amer: 64 mL/min/{1.73_m2} (ref >59)
GFR calc non Af Amer: 55 mL/min/{1.73_m2} — ABNORMAL LOW (ref >59)
Globulin, Total: 3.1 g/dL (ref 1.5–4.5)
Glucose: 97 mg/dL (ref 65–99)
Potassium: 4.3 mmol/L (ref 3.5–5.2)
Sodium: 136 mmol/L (ref 134–144)
Total Protein: 7.2 g/dL (ref 6.0–8.5)

## 2018-12-08 LAB — HEMOGLOBIN A1C
Est. average glucose Bld gHb Est-mCnc: 100 mg/dL
Hgb A1c MFr Bld: 5.1 % (ref 4.8–5.6)

## 2018-12-08 LAB — LIPID PANEL
Chol/HDL Ratio: 4.5 ratio — ABNORMAL HIGH (ref 0.0–4.4)
Cholesterol, Total: 236 mg/dL — ABNORMAL HIGH (ref 100–199)
HDL: 53 mg/dL (ref >39)
LDL Chol Calc (NIH): 148 mg/dL — ABNORMAL HIGH (ref 0–99)
Triglycerides: 194 mg/dL — ABNORMAL HIGH (ref 0–149)
VLDL Cholesterol Cal: 35 mg/dL (ref 5–40)

## 2018-12-08 LAB — URIC ACID: Uric Acid: 5.4 mg/dL (ref 2.5–7.1)

## 2018-12-08 LAB — TSH: TSH: 5.75 u[IU]/mL — ABNORMAL HIGH (ref 0.450–4.500)

## 2018-12-09 ENCOUNTER — Telehealth: Payer: Self-pay

## 2018-12-09 DIAGNOSIS — E039 Hypothyroidism, unspecified: Secondary | ICD-10-CM

## 2018-12-09 NOTE — Telephone Encounter (Signed)
Pt advised.  Lab order is at the front desk.    Thanks,   -Mickel Baas

## 2018-12-09 NOTE — Telephone Encounter (Signed)
Pt advised.  She states she has been about 3-4 doses of Synthroid this month.     Thanks,   -Mickel Baas

## 2018-12-09 NOTE — Telephone Encounter (Signed)
-----   Message from Virginia Crews, MD sent at 12/09/2018  1:32 PM EDT ----- Normal CBC, A1c, uric acid, kidney function, liver function, electrolytes, blood sugar.  TSH is elevated.  Is patient taking Synthroid regularly?  If so, we will need to increase the dose.  Cholesterol is increasing over the last year.  I recommend diet low in saturated fat and regular exercise - 30 min at least 5 times per week.

## 2018-12-09 NOTE — Telephone Encounter (Signed)
With several missed doses, it'd be reasonable to get back on track and recheck in 6 wks, rather than change dose now.  Ok to leave lab slip for TSH up front for her

## 2018-12-15 ENCOUNTER — Other Ambulatory Visit
Admission: RE | Admit: 2018-12-15 | Discharge: 2018-12-15 | Disposition: A | Discharge status: Home / Self Care | Payer: Medicare Other | Source: Ambulatory Visit | Attending: Gastroenterology | Admitting: Gastroenterology

## 2018-12-15 ENCOUNTER — Other Ambulatory Visit: Payer: Self-pay

## 2018-12-15 DIAGNOSIS — Z20828 Contact with and (suspected) exposure to other viral communicable diseases: Secondary | ICD-10-CM | POA: Diagnosis not present

## 2018-12-15 DIAGNOSIS — Z01812 Encounter for preprocedural laboratory examination: Secondary | ICD-10-CM | POA: Diagnosis not present

## 2018-12-16 LAB — SARS CORONAVIRUS 2 (TAT 6-24 HRS): SARS Coronavirus 2: NEGATIVE

## 2018-12-18 ENCOUNTER — Ambulatory Visit: Payer: Medicare Other | Admitting: Anesthesiology

## 2018-12-18 ENCOUNTER — Ambulatory Visit
Admission: RE | Admit: 2018-12-18 | Discharge: 2018-12-18 | Disposition: A | Discharge status: Home / Self Care | Payer: Medicare Other | Attending: Gastroenterology | Admitting: Gastroenterology

## 2018-12-18 ENCOUNTER — Encounter
Admission: RE | Disposition: A | Discharge status: Home / Self Care | Payer: Self-pay | Source: Home / Self Care | Attending: Gastroenterology

## 2018-12-18 ENCOUNTER — Encounter: Payer: Self-pay | Admitting: *Deleted

## 2018-12-18 DIAGNOSIS — Z79890 Hormone replacement therapy (postmenopausal): Secondary | ICD-10-CM | POA: Insufficient documentation

## 2018-12-18 DIAGNOSIS — Z1211 Encounter for screening for malignant neoplasm of colon: Secondary | ICD-10-CM | POA: Diagnosis not present

## 2018-12-18 DIAGNOSIS — Z79899 Other long term (current) drug therapy: Secondary | ICD-10-CM | POA: Insufficient documentation

## 2018-12-18 DIAGNOSIS — I10 Essential (primary) hypertension: Secondary | ICD-10-CM | POA: Insufficient documentation

## 2018-12-18 DIAGNOSIS — M1711 Unilateral primary osteoarthritis, right knee: Secondary | ICD-10-CM | POA: Diagnosis not present

## 2018-12-18 DIAGNOSIS — Z86010 Personal history of colonic polyps: Secondary | ICD-10-CM | POA: Diagnosis not present

## 2018-12-18 DIAGNOSIS — F419 Anxiety disorder, unspecified: Secondary | ICD-10-CM | POA: Insufficient documentation

## 2018-12-18 DIAGNOSIS — D122 Benign neoplasm of ascending colon: Secondary | ICD-10-CM | POA: Diagnosis not present

## 2018-12-18 DIAGNOSIS — J45909 Unspecified asthma, uncomplicated: Secondary | ICD-10-CM | POA: Diagnosis not present

## 2018-12-18 DIAGNOSIS — E079 Disorder of thyroid, unspecified: Secondary | ICD-10-CM | POA: Insufficient documentation

## 2018-12-18 DIAGNOSIS — Z7982 Long term (current) use of aspirin: Secondary | ICD-10-CM | POA: Insufficient documentation

## 2018-12-18 DIAGNOSIS — D126 Benign neoplasm of colon, unspecified: Secondary | ICD-10-CM | POA: Diagnosis not present

## 2018-12-18 DIAGNOSIS — Q438 Other specified congenital malformations of intestine: Secondary | ICD-10-CM | POA: Insufficient documentation

## 2018-12-18 DIAGNOSIS — Z87892 Personal history of anaphylaxis: Secondary | ICD-10-CM | POA: Diagnosis not present

## 2018-12-18 DIAGNOSIS — K621 Rectal polyp: Secondary | ICD-10-CM | POA: Insufficient documentation

## 2018-12-18 DIAGNOSIS — Z880 Allergy status to penicillin: Secondary | ICD-10-CM | POA: Insufficient documentation

## 2018-12-18 DIAGNOSIS — Z800 Family history of malignant neoplasm of digestive organs: Secondary | ICD-10-CM | POA: Insufficient documentation

## 2018-12-18 HISTORY — PX: COLONOSCOPY WITH PROPOFOL: SHX5780

## 2018-12-18 LAB — HM COLONOSCOPY

## 2018-12-18 SURGERY — COLONOSCOPY WITH PROPOFOL
Anesthesia: General

## 2018-12-18 MED ORDER — PROPOFOL 500 MG/50ML IV EMUL
INTRAVENOUS | Status: DC | PRN
  Administered 2018-12-18: 100 ug/kg/min via INTRAVENOUS

## 2018-12-18 MED ORDER — PROPOFOL 10 MG/ML IV BOLUS
INTRAVENOUS | Status: DC | PRN
  Administered 2018-12-18: 50 mg via INTRAVENOUS
  Administered 2018-12-18: 30 mg via INTRAVENOUS
  Administered 2018-12-18: 20 mg via INTRAVENOUS
  Administered 2018-12-18: 30 mg via INTRAVENOUS

## 2018-12-18 MED ORDER — SODIUM CHLORIDE 0.9 % IV SOLN
INTRAVENOUS | Status: DC
  Administered 2018-12-18: 08:00:00 via INTRAVENOUS

## 2018-12-18 NOTE — Anesthesia Postprocedure Evaluation (Signed)
Anesthesia Post Note  Patient: Wanda Ochoa  Procedure(s) Performed: COLONOSCOPY WITH PROPOFOL (N/A )  Patient location during evaluation: Endoscopy Anesthesia Type: General Level of consciousness: awake and alert Pain management: pain level controlled Vital Signs Assessment: post-procedure vital signs reviewed and stable Respiratory status: spontaneous breathing and respiratory function stable Cardiovascular status: stable Anesthetic complications: no     Last Vitals:  Vitals:   12/18/18 0754 12/18/18 1007  BP: (!) 158/78 123/68  Pulse: (!) 109   Temp: (!) 36.2 C (!) 36.3 C  SpO2: 96%     Last Pain:  Vitals:   12/18/18 1007  TempSrc: Tympanic  PainSc:                  Airyn Ellzey K

## 2018-12-18 NOTE — Anesthesia Post-op Follow-up Note (Signed)
Anesthesia QCDR form completed.        

## 2018-12-18 NOTE — H&P (Signed)
Outpatient short stay form Pre-procedure 12/18/2018 9:30 AM Lollie Sails MD  Primary Physician: Lavon Paganini MD  Reason for visit: Colonoscopy  History of present illness: Patient is a 73 year old female presenting today for colonoscopy in regards to her personal history of colon polyps and family history of colon cancer primary relative, mother.  She tolerated her prep well.  She does take 81 mg aspirin daily.  She takes no other aspirin product or blood thinning agent.    Current Facility-Administered Medications:  .  0.9 %  sodium chloride infusion, , Intravenous, Continuous, Lollie Sails, MD, Last Rate: 20 mL/hr at 12/18/18 [id number]  Medications Prior to Admission  Medication Sig Dispense Refill Last Dose  . amLODipine (NORVASC) 5 MG tablet Take 5 mg by mouth 2 (two) times daily.    12/17/2018 at Unknown time  . aspirin 81 MG tablet Take 81 mg by mouth daily.   12/17/2018 at Unknown time  . hydrochlorothiazide (HYDRODIURIL) 12.5 MG tablet Take 1 tablet (12.5 mg total) by mouth daily. 90 tablet 3 12/18/2018 at 0400  . hydrOXYzine (ATARAX/VISTARIL) 25 MG tablet Take 25 mg by mouth 4 (four) times daily as needed.   Past Week at Unknown time  . KRILL OIL PO Take 1 capsule by mouth daily. Reported on 06/02/2015   Past Week at Unknown time  . levothyroxine (SYNTHROID) 112 MCG tablet TAKE 1 TABLET(112 MCG) BY MOUTH DAILY 90 tablet 3 12/17/2018 at Unknown time  . lisinopril (ZESTRIL) 40 MG tablet Take 40 mg by mouth 2 (two) times daily.   12/17/2018 at Unknown time  . metoprolol tartrate (LOPRESSOR) 50 MG tablet Take 1 tablet (50 mg total) by mouth 2 (two) times daily. (Patient taking differently: Take 25 mg by mouth 2 (two) times daily. ) 180 tablet 2 12/18/2018 at 0400  . Multiple Vitamin (MULTIVITAMIN) capsule Take 1 capsule by mouth daily.   Past Week at Unknown time  . nortriptyline (PAMELOR) 25 MG capsule Take 4 capsule at bed time (Patient taking differently: Take 75 mg by mouth at  bedtime. ) 120 capsule 0 Past Week at Unknown time  . polyethylene glycol (MIRALAX / GLYCOLAX) 17 g packet Take 17 g by mouth daily. Mix in 4-8 oz of fluid     . lisinopril (PRINIVIL,ZESTRIL) 20 MG tablet Take 20 mg by mouth 2 (two) times daily.      Ernestine Conrad 3-6-9 CAPS Take by mouth daily.      . QUEtiapine (SEROQUEL) 100 MG tablet Take 200 mg by mouth at bedtime.     Marland Kitchen QUEtiapine (SEROQUEL) 25 MG tablet Take 50 mg by mouth at bedtime.      . VENTOLIN HFA 108 (90 Base) MCG/ACT inhaler INHALE 2 PUFFS INTO THE LUNGS EVERY 6 HOURS AS NEEDED (Patient not taking: Reported on 12/18/2018) 18 g 4 Not Taking at Unknown time     Allergies  Allergen Reactions  . Penicillins Anaphylaxis     Past Medical History:  Diagnosis Date  . Anxiety   . Arthritis    right knee  . Asthma   . HTN (hypertension)   . Thyroid disease     Review of systems:      Physical Exam    Heart and lungs: Good rate and rhythm without rub or gallop lungs are bilaterally clear    HEENT: Normocephalic atraumatic eyes are anicteric    Other:    Pertinant exam for procedure: Soft nontender nondistended bowel sounds positive normoactive  Planned proceedures: Colonoscopy and indicated procedures. I have discussed the risks benefits and complications of procedures to include not limited to bleeding, infection, perforation and the risk of sedation and the patient wishes to proceed.    Lollie Sails, MD Gastroenterology 12/18/2018  9:30 AM

## 2018-12-18 NOTE — Transfer of Care (Signed)
Immediate Anesthesia Transfer of Care Note  Patient: Wanda Ochoa  Procedure(s) Performed: COLONOSCOPY WITH PROPOFOL (N/A )  Patient Location: PACU and Endoscopy Unit  Anesthesia Type:General  Level of Consciousness: sedated and patient cooperative  Airway & Oxygen Therapy: Patient Spontanous Breathing  Post-op Assessment: Report given to RN and Post -op Vital signs reviewed and stable  Post vital signs: Reviewed and stable  Last Vitals:  Vitals Value Taken Time  BP 123/68 12/18/18 1007  Temp 36.3 C 12/18/18 1007  Pulse 61 12/18/18 1007  Resp 15 12/18/18 1007  SpO2 100 % 12/18/18 1007  Vitals shown include unvalidated device data.  Last Pain:  Vitals:   12/18/18 1007  TempSrc: Tympanic  PainSc:          Complications: No apparent anesthesia complications

## 2018-12-18 NOTE — Op Note (Signed)
[facility] Gastroenterology Patient Name: Wanda Ochoa Procedure Date: 12/18/2018 9:19 AM MRN: [id number] Account #: [id number] Date of Birth: [birth year] Admit Type: Outpatient Age: 73 Room: [facility] ENDO ROOM 1 Gender: Female Note Status: Finalized Procedure:            Colonoscopy Indications:          Family history of colon cancer in a first-degree                        relative before age 43 years, Personal history of                        colonic polyps Providers:            Lollie Sails, MD Medicines:            Monitored Anesthesia Care Complications:        No immediate complications. Procedure:            Pre-Anesthesia Assessment:                       - ASA Grade Assessment: II - A patient with mild                        systemic disease.                       After obtaining informed consent, the colonoscope was                        passed under direct vision. Throughout the procedure,                        the patient's blood pressure, pulse, and oxygen                        saturations were monitored continuously. The                        Colonoscope was introduced through the anus and                        advanced to the the cecum, identified by appendiceal                        orifice and ileocecal valve. The colonoscopy was                        performed with moderate difficulty due to a tortuous                        colon. Successful completion of the procedure was aided                        by changing the patient to a supine position and                        changing the patient to a prone position. The patient                        tolerated the procedure well. The  quality of the bowel                        preparation was good. Findings:      A 3 mm polyp was found in the ascending colon. The polyp was sessile.       The polyp was removed with a cold biopsy forceps. Resection and       retrieval were  complete.      A 2 mm polyp was found in the rectum. The polyp was sessile. The polyp       was removed with a cold biopsy forceps. Resection and retrieval were       complete.      The colon (entire examined portion) was significantly tortuous.      The digital rectal exam was normal.      No additional abnormalities were found on retroflexion. Impression:           - One 3 mm polyp in the ascending colon, removed with a                        cold biopsy forceps. Resected and retrieved.                       - One 2 mm polyp in the rectum, removed with a cold                        biopsy forceps. Resected and retrieved.                       - Tortuous colon. Recommendation:       - Discharge patient to home.                       - Await pathology results.                       - Soft diet today, then advance as tolerated to advance                        diet as tolerated.                       - Telephone GI clinic for pathology results in 5 days. Procedure Code(s):    --- Professional ---                       [phone], Colonoscopy, flexible; with biopsy, single or                        multiple Diagnosis Code(s):    --- Professional ---                       K63.5, Polyp of colon                       K62.1, Rectal polyp                       Z80.0, Family history of malignant neoplasm of                        digestive organs  Z86.010, Personal history of colonic polyps                       Q43.8, Other specified congenital malformations of                        intestine CPT copyright 2019 American Medical Association. All rights reserved. The codes documented in this report are preliminary and upon coder review may  be revised to meet current compliance requirements. Lollie Sails, MD 12/18/2018 10:05:29 AM This report has been signed electronically. Number of Addenda: 0 Note Initiated On: 12/18/2018 9:19 AM Scope Withdrawal Time: 0 hours 9  minutes 14 seconds  Total Procedure Duration: 0 hours 25 minutes 17 seconds       [facility]

## 2018-12-18 NOTE — Anesthesia Preprocedure Evaluation (Signed)
Anesthesia Evaluation  Patient identified by MRN, date of birth, ID band Patient awake    Reviewed: Allergy & Precautions, NPO status , [name] Chart, lab work & pertinent test results  History of Anesthesia Complications Negative for: history of anesthetic complications  Airway Mallampati: II       Dental   Pulmonary asthma (no inhalers > 1 year) , neg sleep apnea, neg COPD, Not current smoker,           Cardiovascular hypertension, Pt. on medications and Pt. on home beta blockers (-) Past MI and (-) CHF (-) dysrhythmias (-) Valvular Problems/Murmurs     Neuro/Psych neg Seizures Anxiety Depression    GI/Hepatic Neg liver ROS, neg GERD  ,  Endo/Other  neg diabetesHypothyroidism   Renal/GU negative Renal ROS     Musculoskeletal   Abdominal   Peds  Hematology   Anesthesia Other Findings   Reproductive/Obstetrics                             Anesthesia Physical Anesthesia Plan  ASA: II  Anesthesia Plan: General   Post-op Pain Management:    Induction: Intravenous  PONV Risk Score and Plan: 3 and Propofol infusion, TIVA and Ondansetron  Airway Management Planned: Nasal Cannula  Additional Equipment:   Intra-op Plan:   Post-operative Plan:   Informed Consent: I have reviewed the patients History and Physical, chart, labs and discussed the procedure including the risks, benefits and alternatives for the proposed anesthesia with the patient or authorized representative who has indicated his/her understanding and acceptance.       Plan Discussed with:   Anesthesia Plan Comments:         Anesthesia Quick Evaluation

## 2018-12-19 NOTE — Progress Notes (Signed)
Voicemail. No message left.

## 2018-12-21 ENCOUNTER — Encounter: Payer: Self-pay | Admitting: Gastroenterology

## 2018-12-21 LAB — SURGICAL PATHOLOGY

## 2018-12-22 DIAGNOSIS — F411 Generalized anxiety disorder: Secondary | ICD-10-CM | POA: Diagnosis not present

## 2018-12-22 DIAGNOSIS — F331 Major depressive disorder, recurrent, moderate: Secondary | ICD-10-CM | POA: Diagnosis not present

## 2018-12-23 ENCOUNTER — Other Ambulatory Visit: Payer: Self-pay

## 2018-12-23 ENCOUNTER — Emergency Department: Payer: Medicare Other

## 2018-12-23 ENCOUNTER — Emergency Department
Admission: EM | Admit: 2018-12-23 | Discharge: 2018-12-23 | Disposition: A | Discharge status: Home / Self Care | Payer: Medicare Other | Attending: Emergency Medicine | Admitting: Emergency Medicine

## 2018-12-23 ENCOUNTER — Encounter: Payer: Self-pay | Admitting: Emergency Medicine

## 2018-12-23 DIAGNOSIS — Z7982 Long term (current) use of aspirin: Secondary | ICD-10-CM | POA: Diagnosis not present

## 2018-12-23 DIAGNOSIS — Y929 Unspecified place or not applicable: Secondary | ICD-10-CM | POA: Insufficient documentation

## 2018-12-23 DIAGNOSIS — W1839XA Other fall on same level, initial encounter: Secondary | ICD-10-CM | POA: Insufficient documentation

## 2018-12-23 DIAGNOSIS — S0990XA Unspecified injury of head, initial encounter: Secondary | ICD-10-CM | POA: Diagnosis not present

## 2018-12-23 DIAGNOSIS — R51 Headache: Secondary | ICD-10-CM | POA: Diagnosis not present

## 2018-12-23 DIAGNOSIS — S0083XA Contusion of other part of head, initial encounter: Secondary | ICD-10-CM | POA: Insufficient documentation

## 2018-12-23 DIAGNOSIS — M542 Cervicalgia: Secondary | ICD-10-CM | POA: Diagnosis not present

## 2018-12-23 DIAGNOSIS — Y9301 Activity, walking, marching and hiking: Secondary | ICD-10-CM | POA: Insufficient documentation

## 2018-12-23 DIAGNOSIS — W19XXXA Unspecified fall, initial encounter: Secondary | ICD-10-CM

## 2018-12-23 DIAGNOSIS — E039 Hypothyroidism, unspecified: Secondary | ICD-10-CM | POA: Insufficient documentation

## 2018-12-23 DIAGNOSIS — R531 Weakness: Secondary | ICD-10-CM | POA: Insufficient documentation

## 2018-12-23 DIAGNOSIS — J45909 Unspecified asthma, uncomplicated: Secondary | ICD-10-CM | POA: Insufficient documentation

## 2018-12-23 DIAGNOSIS — I10 Essential (primary) hypertension: Secondary | ICD-10-CM | POA: Insufficient documentation

## 2018-12-23 DIAGNOSIS — Z79899 Other long term (current) drug therapy: Secondary | ICD-10-CM | POA: Diagnosis not present

## 2018-12-23 DIAGNOSIS — Y999 Unspecified external cause status: Secondary | ICD-10-CM | POA: Diagnosis not present

## 2018-12-23 LAB — CBC
HCT: 34 % — ABNORMAL LOW (ref 36.0–46.0)
Hemoglobin: 11.7 g/dL — ABNORMAL LOW (ref 12.0–15.0)
MCH: 32.8 pg (ref 26.0–34.0)
MCHC: 34.4 g/dL (ref 30.0–36.0)
MCV: 95.2 fL (ref 80.0–100.0)
Platelets: 200 10*3/uL (ref 150–400)
RBC: 3.57 MIL/uL — ABNORMAL LOW (ref 3.87–5.11)
RDW: 11.9 % (ref 11.5–15.5)
WBC: 7.4 10*3/uL (ref 4.0–10.5)
nRBC: 0 % (ref 0.0–0.2)

## 2018-12-23 LAB — URINALYSIS, COMPLETE (UACMP) WITH MICROSCOPIC
Bacteria, UA: NONE SEEN
Bilirubin Urine: NEGATIVE
Glucose, UA: NEGATIVE mg/dL
Hgb urine dipstick: NEGATIVE
Ketones, ur: NEGATIVE mg/dL
Nitrite: NEGATIVE
Protein, ur: NEGATIVE mg/dL
Specific Gravity, Urine: 1.009 (ref 1.005–1.030)
pH: 7 (ref 5.0–8.0)

## 2018-12-23 LAB — BASIC METABOLIC PANEL
Anion gap: 11 (ref 5–15)
BUN: 14 mg/dL (ref 8–23)
CO2: 28 mmol/L (ref 22–32)
Calcium: 9.6 mg/dL (ref 8.9–10.3)
Chloride: 94 mmol/L — ABNORMAL LOW (ref 98–111)
Creatinine, Ser: 1.08 mg/dL — ABNORMAL HIGH (ref 0.44–1.00)
GFR calc Af Amer: 59 mL/min — ABNORMAL LOW (ref >60)
GFR calc non Af Amer: 51 mL/min — ABNORMAL LOW (ref >60)
Glucose, Bld: 103 mg/dL — ABNORMAL HIGH (ref 70–99)
Potassium: 4.3 mmol/L (ref 3.5–5.1)
Sodium: 133 mmol/L — ABNORMAL LOW (ref 135–145)

## 2018-12-23 NOTE — ED Triage Notes (Signed)
Patient reports she was out walking when she got slightly dizzy and her legs felt weak, causing her to fall and hit her head on the concrete. Patient denies LOC. States she take baby aspirin daily. Patient reports abrasion to left cheek as well.

## 2018-12-23 NOTE — ED Provider Notes (Signed)
[facility] Emergency Department Provider Note  Time seen: 2:23 PM  I have reviewed the triage vital signs and the nursing notes.   HISTORY  Chief Complaint Fall   HPI Wanda Ochoa is a 73 y.o. female with a past medical history anxiety, arthritis, hypertension presents to the emergency department after a fall.  According to the patient she took her sleep aid very late last night because she was having trouble sleeping.  States she woke up early this morning and attempted to take a walk while taking walks she felt somewhat weak and went to sit down and fell hitting her head on the ground.  Patient states she broke her glasses when she fell and she went to her optometrist to try to get new glasses and they recommended she go to the emergency department because she had bruising along her face.  Patient was concerned as well so she came to the emergency department for evaluation.  The patient denies any symptoms.  Denies any head pain.  Denies any back or neck pain.  Denies LOC.  Largely negative review of systems including no recent fever cough or shortness of breath.   Past Medical History:  Diagnosis Date  . Anxiety   . Arthritis    right knee  . Asthma   . HTN (hypertension)   . Thyroid disease     Patient Active Problem List   Diagnosis Date Noted  . Idiopathic gout involving toe of right foot 12/07/2018  . Effusion of right knee 06/18/2017  . Hematuria 09/11/2016  . Severe recurrent major depression without psychotic features ([place])   . Hypercholesteremia 05/02/2015  . Mitral valve prolapse 02/21/2015  . Allergic rhinitis 12/24/2014  . Osteopenia 12/24/2014  . Hypothyroidism 11/24/2014  . Generalized anxiety disorder 02/24/2012  . Nocturnal hypoxemia 01/20/2012  . Abdominal mass 01/20/2012  . Temporary cerebral vascular dysfunction 01/02/2009  . Benign hypertension 03/28/2008  . Asthma, exogenous 10/22/2007  . Adaptive colitis 03/12/2003     Past Surgical History:  Procedure Laterality Date  . ABDOMINAL HYSTERECTOMY  1990  . BLADDER SUSPENSION  1990  . BREAST CYST ASPIRATION Right 1985  . BREAST EXCISIONAL BIOPSY Right 1967   EXCISIONAL BX - NEG  . BREAST SURGERY Right 1967   Lumpectomy  . CESAREAN SECTION    . COLONOSCOPY WITH PROPOFOL N/A 12/18/2018   Procedure: COLONOSCOPY WITH PROPOFOL;  Surgeon: Lollie Sails, MD;  Location[facility] ENDOSCOPY;  Service: Endoscopy;  Laterality: N/A;  . [place]    Prior to Admission medications   Medication Sig Start Date End Date Taking? Authorizing Provider  amLODipine (NORVASC) 5 MG tablet Take 5 mg by mouth 2 (two) times daily.     [provider]  aspirin 81 MG tablet Take 81 mg by mouth daily.    [provider]  hydrochlorothiazide (HYDRODIURIL) 12.5 MG tablet Take 1 tablet (12.5 mg total) by mouth daily. 12/07/18   Virginia Crews, MD  hydrOXYzine (ATARAX/VISTARIL) 25 MG tablet Take 25 mg by mouth 4 (four) times daily as needed.    [provider]  KRILL OIL PO Take 1 capsule by mouth daily. Reported on 06/02/2015    [provider]  levothyroxine (SYNTHROID) 112 MCG tablet TAKE 1 TABLET(112 MCG) BY MOUTH DAILY 11/02/18   Bacigalupo, Dionne Bucy, MD  lisinopril (PRINIVIL,ZESTRIL) 20 MG tablet Take 20 mg by mouth 2 (two) times daily.     [provider]  lisinopril (ZESTRIL) 40 MG tablet Take 40 mg by mouth 2 (two) times daily.    [provider]  metoprolol tartrate (LOPRESSOR) 50 MG tablet Take 1 tablet (50 mg total) by mouth 2 (two) times daily. Patient taking differently: Take 25 mg by mouth 2 (two) times daily.  08/24/18   Virginia Crews, MD  Multiple Vitamin (MULTIVITAMIN) capsule Take 1 capsule by mouth daily.    [provider]  nortriptyline (PAMELOR) 25 MG capsule Take 4 capsule at bed time Patient taking differently: Take 75 mg by mouth at bedtime.  04/08/12    Arfeen, Arlyce Harman, MD  Omega 3-6-9 CAPS Take by mouth daily.     [provider]  polyethylene glycol (MIRALAX / GLYCOLAX) 17 g packet Take 17 g by mouth daily. Mix in 4-8 oz of fluid    [provider]  QUEtiapine (SEROQUEL) 100 MG tablet Take 200 mg by mouth at bedtime.    [provider]  QUEtiapine (SEROQUEL) 25 MG tablet Take 50 mg by mouth at bedtime.  05/01/15   [provider]  VENTOLIN HFA 108 (90 Base) MCG/ACT inhaler INHALE 2 PUFFS INTO THE LUNGS EVERY 6 HOURS AS NEEDED Patient not taking: Reported on 12/18/2018 03/26/17   Birdie Sons, MD    Allergies  Allergen Reactions  . Penicillins Anaphylaxis    Family History  Problem Relation Age of Onset  . Depression Father   . Suicidality Father   . Depression Sister   . Breast cancer Sister 32  . Autism Son   . Heart disease Mother   . Colon cancer Mother   . Hypertension Mother   . Anxiety disorder Mother   . Mental illness Daughter   . Allergies Brother   . Allergies Sister   . Asthma Maternal Uncle   . Heart disease Maternal Grandfather   . Heart disease Maternal Grandmother   . Breast cancer Paternal Grandmother   . Breast cancer Other     Social History Social History   Tobacco Use  . Smoking status: Never Smoker  . Smokeless tobacco: Never Used  Substance Use Topics  . Alcohol use: No  . Drug use: No    Review of Systems Constitutional: Negative for fever. Cardiovascular: Negative for chest pain. Respiratory: Negative for shortness of breath. Gastrointestinal: Negative for abdominal pain Musculoskeletal: Negative for musculoskeletal complaints Neurological: Negative for headache All other ROS negative  ____________________________________________   PHYSICAL EXAM:  VITAL SIGNS: ED Triage Vitals  Enc Vitals Group     BP 12/23/18 1203 (!) 154/64     Pulse Rate 12/23/18 1203 68     Resp 12/23/18 1339 16     Temp 12/23/18 1203 98.5 F (36.9 C)     Temp Source  12/23/18 1203 Oral     SpO2 12/23/18 1203 100 %     Weight 12/23/18 1206 183 lb (83 kg)     Height 12/23/18 1206 5\' 9"  (1.753 m)     Head Circumference --      Peak Flow --      Pain Score 12/23/18 1206 2     Pain Loc --      Pain Edu? --      Excl. in [place]? --    Constitutional: Alert and oriented. Well appearing and in no distress. Eyes: Normal exam ENT      Head: Minimal amount of bruising around the chin and left face.      Mouth/Throat: Mucous membranes  are moist. Cardiovascular: Normal rate, regular rhythm. No murmur Respiratory: Normal respiratory effort without tachypnea nor retractions. Breath sounds are clear Gastrointestinal: Soft and nontender. No distention.   Musculoskeletal: Nontender with normal range of motion in all extremities.  Neurologic:  Normal speech and language. No gross focal neurologic deficits  Skin:  Skin is warm.  Small skin tear to left elbow. Psychiatric: Mood and affect are normal.   ____________________________________________    EKG  EKG viewed and interpreted by myself shows a normal sinus rhythm at 70 bpm with a narrow QRS, normal axis, normal intervals, no concerning ST changes.  Reassuring EKG.  ____________________________________________    RADIOLOGY  CT scans of the head neck and face are negative for acute abnormality.  ____________________________________________   INITIAL IMPRESSION / ASSESSMENT AND PLAN / ED COURSE  Pertinent labs & imaging results that were available during my care of the patient were reviewed by me and considered in my medical decision making (see chart for details).   Patient presents to the emergency department after a fall.  [name] work-up is overall reassuring including EKG and labs.  CT scans are negative.  Patient will be discharged home with PCP follow-up.  Patient agreeable to plan of care.  I instructed the patient to drink plenty of fluids and take her sleep aid earlier in the evening.  RIYANNA RADKA was evaluated in Emergency Department on 12/23/2018 for the symptoms described in the history of present illness. She was evaluated in the context of the global COVID-19 pandemic, which necessitated consideration that the patient might be at risk for infection with the SARS-CoV-2 virus that causes COVID-19. Institutional protocols and algorithms that pertain to the evaluation of patients at risk for COVID-19 are in a state of rapid change based on information released by regulatory bodies including the CDC and federal and state organizations. These policies and algorithms were followed during the patient's care in the ED.  ____________________________________________   FINAL CLINICAL IMPRESSION(S) / ED DIAGNOSES  Cheral Marker, MD 12/23/18 1429

## 2018-12-30 ENCOUNTER — Other Ambulatory Visit: Payer: Self-pay | Admitting: *Deleted

## 2018-12-30 DIAGNOSIS — Z20822 Contact with and (suspected) exposure to covid-19: Secondary | ICD-10-CM

## 2019-01-01 LAB — NOVEL CORONAVIRUS, NAA: SARS-CoV-2, NAA: NOT DETECTED

## 2019-01-19 DIAGNOSIS — F331 Major depressive disorder, recurrent, moderate: Secondary | ICD-10-CM | POA: Diagnosis not present

## 2019-01-19 DIAGNOSIS — F411 Generalized anxiety disorder: Secondary | ICD-10-CM | POA: Diagnosis not present

## 2019-01-20 ENCOUNTER — Encounter: Payer: Self-pay | Admitting: Family Medicine

## 2019-02-08 ENCOUNTER — Other Ambulatory Visit: Payer: Self-pay

## 2019-02-08 ENCOUNTER — Ambulatory Visit (INDEPENDENT_AMBULATORY_CARE_PROVIDER_SITE_OTHER): Payer: Medicare Other | Admitting: Family Medicine

## 2019-02-08 ENCOUNTER — Encounter: Payer: Self-pay | Admitting: Family Medicine

## 2019-02-08 VITALS — BP 102/64 | HR 84 | Temp 97.3°F | Resp 16 | Ht 69.0 in | Wt 183.4 lb

## 2019-02-08 DIAGNOSIS — I10 Essential (primary) hypertension: Secondary | ICD-10-CM

## 2019-02-08 DIAGNOSIS — E039 Hypothyroidism, unspecified: Secondary | ICD-10-CM

## 2019-02-08 NOTE — Assessment & Plan Note (Signed)
Well controlled Continue current medications Recheck metabolic panel F/u in 6 months  

## 2019-02-08 NOTE — Progress Notes (Signed)
Patient: Wanda Ochoa Female    DOB: 12/19/1945   73 y.o.   MRN: [id number] Visit Date: 02/08/2019  Today's Provider: Lavon Paganini, MD   Chief Complaint  Patient presents with  . Hypertension   Subjective:     HPI  Hypertension, follow-up:  BP Readings from Last 3 Encounters:  02/08/19 102/64  12/23/18 (!) 144/76  12/18/18 123/68    She was last seen for hypertension 2 months ago.  BP at that visit was 144/76. Management changes since that visit include add HCTZ 12.5 mg daily. She reports excellent compliance with treatment. She is not having side effects.  She is exercising. She is adherent to low salt diet.   Outside blood pressures are stable. She is experiencing none.  Patient denies chest pain and lower extremity edema.   Cardiovascular risk factors include advanced age (older than 36 for men, 86 for women) and hypertension.  Use of agents associated with hypertension: none.     Weight trend: stable Wt Readings from Last 3 Encounters:  02/08/19 183 lb 6.4 oz (83.2 kg)  12/23/18 183 lb (83 kg)  12/18/18 180 lb (81.6 kg)    Current diet: in general, a "healthy" diet    ------------------------------------------------------------------------ TSH slightly elevated at last visit and Synthroid dose increased. Doing well and asymptomatic  Allergies  Allergen Reactions  . Penicillins Anaphylaxis     Current Outpatient Medications:  .  amLODipine (NORVASC) 5 MG tablet, Take 5 mg by mouth 2 (two) times daily. , Disp: , Rfl:  .  aspirin 81 MG tablet, Take 81 mg by mouth daily., Disp: , Rfl:  .  hydrochlorothiazide (HYDRODIURIL) 12.5 MG tablet, Take 1 tablet (12.5 mg total) by mouth daily., Disp: 90 tablet, Rfl: 3 .  hydrOXYzine (ATARAX/VISTARIL) 25 MG tablet, Take 25 mg by mouth 4 (four) times daily as needed., Disp: , Rfl:  .  KRILL OIL PO, Take 1 capsule by mouth daily. Reported on 06/02/2015, Disp: , Rfl:  .  levothyroxine (SYNTHROID) 112  MCG tablet, TAKE 1 TABLET(112 MCG) BY MOUTH DAILY, Disp: 90 tablet, Rfl: 3 .  lisinopril (ZESTRIL) 40 MG tablet, Take 40 mg by mouth 2 (two) times daily., Disp: , Rfl:  .  metoprolol tartrate (LOPRESSOR) 50 MG tablet, Take 1 tablet (50 mg total) by mouth 2 (two) times daily. (Patient taking differently: Take 25 mg by mouth 2 (two) times daily. ), Disp: 180 tablet, Rfl: 2 .  Multiple Vitamin (MULTIVITAMIN) capsule, Take 1 capsule by mouth daily., Disp: , Rfl:  .  nortriptyline (PAMELOR) 25 MG capsule, Take 4 capsule at bed time (Patient taking differently: Take 3 capsule at bed time), Disp: 120 capsule, Rfl: 0 .  Omega 3-6-9 CAPS, Take by mouth daily. , Disp: , Rfl:  .  polyethylene glycol (MIRALAX / GLYCOLAX) 17 g packet, Take 17 g by mouth daily. Mix in 4-8 oz of fluid, Disp: , Rfl:  .  QUEtiapine (SEROQUEL) 100 MG tablet, Take 100 mg by mouth at bedtime. , Disp: , Rfl:  .  QUEtiapine (SEROQUEL) 25 MG tablet, Take 50 mg by mouth at bedtime. , Disp: , Rfl:  .  VENTOLIN HFA 108 (90 Base) MCG/ACT inhaler, INHALE 2 PUFFS INTO THE LUNGS EVERY 6 HOURS AS NEEDED, Disp: 18 g, Rfl: 4 .  lisinopril (PRINIVIL,ZESTRIL) 20 MG tablet, Take 20 mg by mouth 2 (two) times daily. , Disp: , Rfl:   Review of Systems  Constitutional: Negative.  Respiratory: Negative.   Cardiovascular: Negative.   Endocrine: Negative.   Neurological: Negative.   Psychiatric/Behavioral: Negative.     Social History   Tobacco Use  . Smoking status: Never Smoker  . Smokeless tobacco: Never Used  Substance Use Topics  . Alcohol use: No      Objective:   BP 102/64 (BP Location: Left Arm, Patient Position: Sitting, Cuff Size: Large)   Pulse 84   Temp (!) 97.3 F (36.3 C) (Temporal)   Resp 16   Ht 5\' 9"  (1.753 m)   Wt 183 lb 6.4 oz (83.2 kg)   BMI 27.08 kg/m  Vitals:   02/08/19 1137  BP: 102/64  Pulse: 84  Resp: 16  Temp: (!) 97.3 F (36.3 C)  TempSrc: Temporal  Weight: 183 lb 6.4 oz (83.2 kg)  Height: 5\' 9"   (1.753 m)  Body mass index is 27.08 kg/m.   Physical Exam Vitals signs reviewed.  Constitutional:      General: She is not in acute distress.    Appearance: Normal appearance. She is well-developed. She is not diaphoretic.  HENT:     Head: Normocephalic and atraumatic.  Eyes:     General: No scleral icterus.    Conjunctiva/sclera: Conjunctivae normal.  Neck:     Musculoskeletal: Neck supple.     Thyroid: No thyromegaly.     Comments: No thyromegaly Cardiovascular:     Rate and Rhythm: Normal rate and regular rhythm.     Pulses: Normal pulses.     Heart sounds: Normal heart sounds. No murmur.  Pulmonary:     Effort: Pulmonary effort is normal. No respiratory distress.     Breath sounds: Normal breath sounds. No wheezing, rhonchi or rales.  Musculoskeletal:     Right lower leg: No edema.     Left lower leg: No edema.  Lymphadenopathy:     Cervical: No cervical adenopathy.  Skin:    General: Skin is warm and dry.     Capillary Refill: Capillary refill takes less than 2 seconds.     Findings: No rash.  Neurological:     Mental Status: She is alert and oriented to person, place, and time. Mental status is at baseline.  Psychiatric:        Mood and Affect: Mood normal.        Behavior: Behavior normal.      No results found for any visits on 02/08/19.     Assessment & Plan   Problem List Items Addressed This Visit      Cardiovascular and Mediastinum   Benign hypertension - Primary    Well controlled Continue current medications Recheck metabolic panel F/u in 6 months       Relevant Orders   Basic Metabolic Panel (BMET)     Endocrine   Hypothyroidism    Previously with slightly elevated TSH Asymptomatic Continue Synthroid at current dose Recheck TSH      Relevant Orders   TSH       Return in about 6 months (around 08/08/2019) for CPE/AWV.   The entirety of the information documented in the History of Present Illness, Review of Systems and  Physical Exam were personally obtained by me. Portions of this information were initially documented by Lynford Humphrey , CMA and reviewed by me for thoroughness and accuracy.    Chiara Coltrin, Dionne Bucy, MD MPH [facility]

## 2019-02-08 NOTE — Assessment & Plan Note (Signed)
Previously with slightly elevated TSH Asymptomatic Continue Synthroid at current dose Recheck TSH

## 2019-02-09 LAB — BASIC METABOLIC PANEL
BUN/Creatinine Ratio: 11 — ABNORMAL LOW (ref 12–28)
BUN: 11 mg/dL (ref 8–27)
CO2: 25 mmol/L (ref 20–29)
Calcium: 9.1 mg/dL (ref 8.7–10.3)
Chloride: 97 mmol/L (ref 96–106)
Creatinine, Ser: 1.01 mg/dL — ABNORMAL HIGH (ref 0.57–1.00)
GFR calc Af Amer: 64 mL/min/{1.73_m2} (ref >59)
GFR calc non Af Amer: 56 mL/min/{1.73_m2} — ABNORMAL LOW (ref >59)
Glucose: 85 mg/dL (ref 65–99)
Potassium: 4.3 mmol/L (ref 3.5–5.2)
Sodium: 137 mmol/L (ref 134–144)

## 2019-02-09 LAB — TSH: TSH: 0.193 u[IU]/mL — ABNORMAL LOW (ref 0.450–4.500)

## 2019-02-10 ENCOUNTER — Telehealth: Payer: Self-pay

## 2019-02-10 DIAGNOSIS — E039 Hypothyroidism, unspecified: Secondary | ICD-10-CM

## 2019-02-10 MED ORDER — LEVOTHYROXINE SODIUM 100 MCG PO TABS: 100.0000 ug | ORAL_TABLET | Freq: Every day | ORAL | 0 refills | Status: DC

## 2019-02-10 NOTE — Telephone Encounter (Signed)
-----   Message from Virginia Crews, MD sent at 02/10/2019 11:31 AM EST ----- Stable kidney function and normal electrolytes.  TSH is now low, meaning that we have now overcorrected her thyroid.  Recommend decreasing Synthroid back to 100 mcg daily.  Okay to send prescription if patient agrees.  Recheck in 2 to 3 months.

## 2019-02-10 NOTE — Telephone Encounter (Signed)
Patient advised as below. Patient verbalizes understanding and is in agreement with treatment plan.  

## 2019-02-16 DIAGNOSIS — F331 Major depressive disorder, recurrent, moderate: Secondary | ICD-10-CM | POA: Diagnosis not present

## 2019-02-16 DIAGNOSIS — F411 Generalized anxiety disorder: Secondary | ICD-10-CM | POA: Diagnosis not present

## 2019-02-24 DIAGNOSIS — F411 Generalized anxiety disorder: Secondary | ICD-10-CM | POA: Diagnosis not present

## 2019-02-24 DIAGNOSIS — F5105 Insomnia due to other mental disorder: Secondary | ICD-10-CM | POA: Diagnosis not present

## 2019-02-24 DIAGNOSIS — F331 Major depressive disorder, recurrent, moderate: Secondary | ICD-10-CM | POA: Diagnosis not present

## 2019-03-30 ENCOUNTER — Other Ambulatory Visit: Payer: Self-pay

## 2019-03-30 ENCOUNTER — Ambulatory Visit: Payer: BLUE CROSS/BLUE SHIELD | Attending: Internal Medicine

## 2019-03-30 DIAGNOSIS — Z20822 Contact with and (suspected) exposure to covid-19: Secondary | ICD-10-CM

## 2019-04-01 LAB — NOVEL CORONAVIRUS, NAA: SARS-CoV-2, NAA: NOT DETECTED

## 2019-04-15 DIAGNOSIS — F331 Major depressive disorder, recurrent, moderate: Secondary | ICD-10-CM | POA: Diagnosis not present

## 2019-04-15 DIAGNOSIS — F411 Generalized anxiety disorder: Secondary | ICD-10-CM | POA: Diagnosis not present

## 2019-04-21 DIAGNOSIS — F411 Generalized anxiety disorder: Secondary | ICD-10-CM | POA: Diagnosis not present

## 2019-04-21 DIAGNOSIS — F5105 Insomnia due to other mental disorder: Secondary | ICD-10-CM | POA: Diagnosis not present

## 2019-04-21 DIAGNOSIS — F331 Major depressive disorder, recurrent, moderate: Secondary | ICD-10-CM | POA: Diagnosis not present

## 2019-04-30 ENCOUNTER — Other Ambulatory Visit
Admission: RE | Admit: 2019-04-30 | Discharge: 2019-04-30 | Disposition: A | Discharge status: Home / Self Care | Payer: Medicare Other | Source: Ambulatory Visit | Attending: Psychiatry | Admitting: Psychiatry

## 2019-04-30 ENCOUNTER — Encounter: Payer: Self-pay | Admitting: Family Medicine

## 2019-04-30 ENCOUNTER — Ambulatory Visit (INDEPENDENT_AMBULATORY_CARE_PROVIDER_SITE_OTHER): Payer: Medicare Other | Admitting: Family Medicine

## 2019-04-30 ENCOUNTER — Other Ambulatory Visit: Payer: Self-pay

## 2019-04-30 VITALS — BP 159/80 | HR 81 | Temp 95.1°F | Wt 180.0 lb

## 2019-04-30 DIAGNOSIS — I10 Essential (primary) hypertension: Secondary | ICD-10-CM | POA: Diagnosis not present

## 2019-04-30 DIAGNOSIS — Z01812 Encounter for preprocedural laboratory examination: Secondary | ICD-10-CM | POA: Diagnosis not present

## 2019-04-30 DIAGNOSIS — M25521 Pain in right elbow: Secondary | ICD-10-CM | POA: Diagnosis not present

## 2019-04-30 DIAGNOSIS — Z20822 Contact with and (suspected) exposure to covid-19: Secondary | ICD-10-CM | POA: Insufficient documentation

## 2019-04-30 DIAGNOSIS — R296 Repeated falls: Secondary | ICD-10-CM | POA: Diagnosis not present

## 2019-04-30 NOTE — Patient Instructions (Signed)

## 2019-04-30 NOTE — Progress Notes (Signed)
Patient: Wanda Ochoa. [place] Female    DOB: 23-Dec-1945   74 y.o.   MRN: [id number] Visit Date: 04/30/2019  Today's Provider: Lavon Paganini, MD   Chief Complaint  Patient presents with  . Fall    Pt reports falling three times in the last week.   Subjective:    Fall The pain is present in the right elbow. Pertinent negatives include no abdominal pain, bowel incontinence, headaches, hearing loss, loss of consciousness, numbness, tingling or visual change.    1st one heel caught on rug in kitchen and she fell This morning she was exercising and slowing down treadmill and fell Then fell this afternoon on the stairs  No prodrome of arrhythmia, dizziness, vision changes, or any other symptoms.  No loss of consciousness or seizure symptoms.  Has a lot of stress on her.  She is going to have ECT next week.  Son has come back to live with her.  R elbow is doing better, but did get bruising and swelling after the initial fall.  She does not have any loss of range of motion and pain is pretty minimal.  Depression screen [facility] 2/9 04/30/2019 02/08/2019 08/05/2018 06/13/2017 05/14/2016  Decreased Interest 1 0 0 0 1  Down, Depressed, Hopeless 0 0 0 0 1  PHQ - 2 Score 1 0 0 0 2  Altered sleeping 1 3 - - 1  Tired, decreased energy 0 0 - - 0  Change in appetite 0 0 - - 0  Feeling bad or failure about yourself  0 0 - - 0  Trouble concentrating 1 0 - - 0  Moving slowly or fidgety/restless 0 0 - - 0  Suicidal thoughts 0 0 - - 0  PHQ-9 Score 3 3 - - 3  Difficult doing work/chores Not difficult at all Not difficult at all - - Not difficult at all     GAD 7 : Generalized Anxiety Score 04/30/2019  Nervous, Anxious, on Edge 1  Control/stop worrying 1  Worry too much - different things 1  Trouble relaxing 1  Restless 0  Easily annoyed or irritable 0  Afraid - awful might happen 1  Total GAD 7 Score 5  Anxiety Difficulty Not difficult at all       Allergies  Allergen Reactions    . Penicillins Anaphylaxis     Current Outpatient Medications:  .  amLODipine (NORVASC) 5 MG tablet, Take 5 mg by mouth 2 (two) times daily. , Disp: , Rfl:  .  aspirin 81 MG tablet, Take 81 mg by mouth daily., Disp: , Rfl:  .  hydrochlorothiazide (HYDRODIURIL) 12.5 MG tablet, Take 1 tablet (12.5 mg total) by mouth daily., Disp: 90 tablet, Rfl: 3 .  hydrOXYzine (ATARAX/VISTARIL) 25 MG tablet, Take 25 mg by mouth 4 (four) times daily as needed., Disp: , Rfl:  .  KRILL OIL PO, Take 1 capsule by mouth daily. Reported on 06/02/2015, Disp: , Rfl:  .  levothyroxine (SYNTHROID) 100 MCG tablet, Take 1 tablet (100 mcg total) by mouth daily., Disp: 90 tablet, Rfl: 0 .  lisinopril (PRINIVIL,ZESTRIL) 20 MG tablet, Take 20 mg by mouth 2 (two) times daily. , Disp: , Rfl:  .  lisinopril (ZESTRIL) 40 MG tablet, Take 20 mg by mouth 2 (two) times daily., Disp: , Rfl:  .  metoprolol tartrate (LOPRESSOR) 50 MG tablet, Take 1 tablet (50 mg total) by mouth 2 (two) times daily. (Patient taking differently: Take 25 mg  by mouth 2 (two) times daily. ), Disp: 180 tablet, Rfl: 2 .  Multiple Vitamin (MULTIVITAMIN) capsule, Take 1 capsule by mouth daily., Disp: , Rfl:  .  nortriptyline (PAMELOR) 25 MG capsule, Take 4 capsule at bed time (Patient taking differently: Take 3 capsule at bed time), Disp: 120 capsule, Rfl: 0 .  Omega 3-6-9 CAPS, Take by mouth daily. , Disp: , Rfl:  .  polyethylene glycol (MIRALAX / GLYCOLAX) 17 g packet, Take 17 g by mouth daily. Mix in 4-8 oz of fluid, Disp: , Rfl:  .  QUEtiapine (SEROQUEL) 25 MG tablet, Take 50 mg by mouth at bedtime. , Disp: , Rfl:  .  VENTOLIN HFA 108 (90 Base) MCG/ACT inhaler, INHALE 2 PUFFS INTO THE LUNGS EVERY 6 HOURS AS NEEDED, Disp: 18 g, Rfl: 4 .  QUEtiapine (SEROQUEL) 100 MG tablet, Take 100 mg by mouth at bedtime. , Disp: , Rfl:   Review of Systems  Constitutional: Negative.   Respiratory: Negative.   Gastrointestinal: Negative.  Negative for abdominal pain and  bowel incontinence.  Genitourinary: Negative.   Musculoskeletal: Positive for arthralgias (Right elbow pain) and joint swelling. Negative for back pain, gait problem, myalgias, neck pain and neck stiffness.  Neurological: Negative for dizziness, tingling, loss of consciousness, weakness, light-headedness, numbness and headaches.  Psychiatric/Behavioral: The patient is nervous/anxious.     Social History   Tobacco Use  . Smoking status: Never Smoker  . Smokeless tobacco: Never Used  Substance Use Topics  . Alcohol use: No      Objective:   BP (!) 179/81 (BP Location: Left Arm, Patient Position: Sitting, Cuff Size: Large)   Pulse 81   Temp (!) 95.1 F (35.1 C) (Temporal)   Wt 180 lb (81.6 kg)   BMI 26.58 kg/m  Vitals:   04/30/19 1609  BP: (!) 179/81  Pulse: 81  Temp: (!) 95.1 F (35.1 C)  TempSrc: Temporal  Weight: 180 lb (81.6 kg)  Body mass index is 26.58 kg/m.   Physical Exam Vitals reviewed.  Constitutional:      General: She is not in acute distress.    Appearance: Normal appearance. She is not diaphoretic.  HENT:     Head: Normocephalic and atraumatic.  Eyes:     General: No scleral icterus.    Conjunctiva/sclera: Conjunctivae normal.  Cardiovascular:     Rate and Rhythm: Normal rate and regular rhythm.     Heart sounds: Normal heart sounds. No murmur.  Pulmonary:     Effort: Pulmonary effort is normal. No respiratory distress.     Breath sounds: Normal breath sounds. No wheezing or rhonchi.  Abdominal:     General: There is no distension.     Palpations: Abdomen is soft.     Tenderness: There is no abdominal tenderness.  Musculoskeletal:     Cervical back: Neck supple.     Comments: Lower extremities with 5 out of 5 strength in all muscle groups.  Sensation grossly intact to light touch. Right elbow: Minimal swelling remains.  No bony tenderness.  Range of motion intact.  Strength intact.  Large area of bruising noted  Lymphadenopathy:     Cervical:  No cervical adenopathy.  Skin:    General: Skin is warm and dry.     Findings: Bruising (of R elbow) present.  Neurological:     Mental Status: She is alert and oriented to person, place, and time. Mental status is at baseline.     Sensory: No sensory deficit.  Motor: No weakness.     Coordination: Coordination normal.     Gait: Gait normal.  Psychiatric:     Comments: Flat affect      No results found for any visits on 04/30/19.     Assessment & Plan    1. Frequent falls -New problem -Patient has no history of frequent falls, but 3 falls in the last week -They were all mechanical falls without any prodrome suggestive of cardiac, neurologic, or other etiology -We discussed fall precautions in the home, including removing throw rugs -It is possible that all of her stress is leading to distraction and decreased concentration which led to the stumbles -We will do physical therapy -Discussed return precautions and red flags - Ambulatory referral to Physical Therapy  2. Right elbow pain -New problem -She did hit it on her fall earlier this week -There is bruising and swelling, but no bony tenderness to suggest any fracture -No imaging required at this time -Discussed return precautions  3. Benign hypertension -Typically well controlled -Asymptomatic with no red flag symptoms -Elevated today, which may be related to all the stress as above -Encouraged her to check home blood pressures and let us know if it remains elevated    Return if symptoms worsen or fail to improve.   The entirety of the information documented in the History of Present Illness, Review of Systems and Physical Exam were personally obtained by me. Portions of this information were initially documented by Ashley Royalty, CMA and reviewed by me for thoroughness and accuracy.    Masen Luallen, Dionne Bucy, MD MPH [facility]

## 2019-05-01 LAB — SARS CORONAVIRUS 2 (TAT 6-24 HRS): SARS Coronavirus 2: NEGATIVE

## 2019-05-03 ENCOUNTER — Encounter
Admission: RE | Admit: 2019-05-03 | Discharge: 2019-05-03 | Disposition: A | Discharge status: Home / Self Care | Payer: Medicare Other | Source: Ambulatory Visit | Attending: Psychiatry | Admitting: Psychiatry

## 2019-05-03 ENCOUNTER — Other Ambulatory Visit: Payer: Self-pay | Admitting: Psychiatry

## 2019-05-03 ENCOUNTER — Encounter: Payer: Self-pay | Admitting: Anesthesiology

## 2019-05-03 ENCOUNTER — Other Ambulatory Visit: Payer: Self-pay

## 2019-05-03 DIAGNOSIS — F332 Major depressive disorder, recurrent severe without psychotic features: Secondary | ICD-10-CM | POA: Diagnosis not present

## 2019-05-03 DIAGNOSIS — F419 Anxiety disorder, unspecified: Secondary | ICD-10-CM | POA: Diagnosis not present

## 2019-05-03 DIAGNOSIS — J45909 Unspecified asthma, uncomplicated: Secondary | ICD-10-CM | POA: Insufficient documentation

## 2019-05-03 DIAGNOSIS — I10 Essential (primary) hypertension: Secondary | ICD-10-CM | POA: Diagnosis not present

## 2019-05-03 DIAGNOSIS — Z79899 Other long term (current) drug therapy: Secondary | ICD-10-CM | POA: Diagnosis not present

## 2019-05-03 DIAGNOSIS — F418 Other specified anxiety disorders: Secondary | ICD-10-CM | POA: Diagnosis not present

## 2019-05-03 DIAGNOSIS — Z7901 Long term (current) use of anticoagulants: Secondary | ICD-10-CM | POA: Insufficient documentation

## 2019-05-03 DIAGNOSIS — E079 Disorder of thyroid, unspecified: Secondary | ICD-10-CM | POA: Insufficient documentation

## 2019-05-03 DIAGNOSIS — F339 Major depressive disorder, recurrent, unspecified: Secondary | ICD-10-CM | POA: Diagnosis not present

## 2019-05-03 DIAGNOSIS — M1990 Unspecified osteoarthritis, unspecified site: Secondary | ICD-10-CM | POA: Diagnosis not present

## 2019-05-03 DIAGNOSIS — E669 Obesity, unspecified: Secondary | ICD-10-CM | POA: Diagnosis not present

## 2019-05-03 MED ORDER — SODIUM CHLORIDE 0.9 % IV SOLN: INTRAVENOUS | Status: DC | PRN

## 2019-05-03 MED ORDER — METHOHEXITAL SODIUM 100 MG/10ML IV SOSY
PREFILLED_SYRINGE | INTRAVENOUS | Status: DC | PRN
  Administered 2019-05-03: 80 mg via INTRAVENOUS

## 2019-05-03 MED ORDER — KETOROLAC TROMETHAMINE 30 MG/ML IJ SOLN: 30.0000 mg | Freq: Once | INTRAMUSCULAR | Status: AC

## 2019-05-03 MED ORDER — KETOROLAC TROMETHAMINE 30 MG/ML IJ SOLN
INTRAMUSCULAR | Status: AC
  Administered 2019-05-03: 09:00:00 30 mg via INTRAVENOUS
  Filled 2019-05-03: qty 1

## 2019-05-03 MED ORDER — LABETALOL HCL 5 MG/ML IV SOLN
INTRAVENOUS | Status: DC | PRN
  Administered 2019-05-03: 5 mg via INTRAVENOUS

## 2019-05-03 MED ORDER — SUCCINYLCHOLINE CHLORIDE 20 MG/ML IJ SOLN
INTRAMUSCULAR | Status: DC | PRN
  Administered 2019-05-03: 80 mg via INTRAVENOUS

## 2019-05-03 MED ORDER — SUCCINYLCHOLINE CHLORIDE 20 MG/ML IJ SOLN
INTRAMUSCULAR | Status: AC
  Filled 2019-05-03: qty 1

## 2019-05-03 MED ORDER — SODIUM CHLORIDE 0.9 % IV SOLN
500.0000 mL | Freq: Once | INTRAVENOUS | Status: AC
  Administered 2019-05-03: 500 mL via INTRAVENOUS

## 2019-05-03 NOTE — Transfer of Care (Signed)
Immediate Anesthesia Transfer of Care Note  Patient: Wanda Ochoa  Procedure(s) Performed: ECT TX  Patient Location: PACU  Anesthesia Type:General  Level of Consciousness: drowsy and patient cooperative  Airway & Oxygen Therapy: Patient Spontanous Breathing  Post-op Assessment: Report given to RN and Post -op Vital signs reviewed and stable  Post vital signs: Reviewed and stable  Last Vitals:  Vitals Value Taken Time  BP 161/83 05/03/19 1028  Temp 36.6 C 05/03/19 1028  Pulse 76 05/03/19 1029  Resp 16 05/03/19 1028  SpO2 97 % 05/03/19 1029  Vitals shown include unvalidated device data.  Last Pain:  Vitals:   05/03/19 1028  TempSrc:   PainSc: (P) Asleep         Complications: No apparent anesthesia complications

## 2019-05-03 NOTE — Discharge Instructions (Signed)
1)  The drugs that you have been given will stay in your system until tomorrow so for the       next 24 hours you should not:  A. Drive an automobile  B. Make any legal decisions  C. Drink any alcoholic beverages  2)  You may resume your regular meals upon return home.  3)  A responsible adult must take you home.  Someone should stay with you for a few          hours, then be available by phone for the remainder of the treatment day.  4)  You May experience any of the following symptoms:  Headache, Nausea and a dry mouth (due to the medications you were given),  temporary memory loss and some confusion, or sore muscles (a warm bath  should help this).  If you you experience any of these symptoms let us know on                your return visit.  5)  Report any of the following: any acute discomfort, severe headache, or temperature        greater than 100.5 F.   Also report any unusual redness, swelling, drainage, or pain         at your IV site.    You may report Symptoms to:  [place] [facility]          Phone: [phone], ECT Department           or Dr. Prescott Gum office [phone]  6)  Your next ECT Treatment is Wednesday February 10 at 8:30   We will call 2 days prior to your scheduled appointment for arrival times.  7)  Nothing to eat or drink after midnight the night before your procedure.  8)  Take blood pressure medication with a sip of water the morning of your procedure.  9)  Other Instructions: Call [phone] to cancel the morning of your procedure due         to illness or emergency.  10) We will call within 72 hours to assess how you are feeling.

## 2019-05-03 NOTE — Anesthesia Postprocedure Evaluation (Signed)
Anesthesia Post Note  Patient: Wanda Ochoa. Fees  Procedure(s) Performed: ECT TX  Patient location during evaluation: PACU Anesthesia Type: General Level of consciousness: awake and alert Pain management: pain level controlled Vital Signs Assessment: post-procedure vital signs reviewed and stable Respiratory status: spontaneous breathing, nonlabored ventilation and respiratory function stable Cardiovascular status: blood pressure returned to baseline and stable Postop Assessment: no signs of nausea or vomiting Anesthetic complications: no     Last Vitals:  Vitals:   05/03/19 1055 05/03/19 1106  BP: (!) 170/75 (!) 169/79  Pulse: 73 70  Resp: 16 18  Temp: 36.7 C 36.7 C  SpO2: 96%     Last Pain:  Vitals:   05/03/19 1106  TempSrc: Oral  PainSc: 0-No pain                 Young Brim

## 2019-05-03 NOTE — Anesthesia Preprocedure Evaluation (Addendum)
Anesthesia Evaluation  Patient identified by MRN, date of birth, ID band Patient awake    Reviewed: Allergy & Precautions, NPO status , [name] Chart, lab work & pertinent test results  History of Anesthesia Complications Negative for: history of anesthetic complications  Airway Mallampati: II  TM Distance: >3 FB Neck ROM: Full    Dental no notable dental hx.    Pulmonary asthma ,    breath sounds clear to auscultation- rhonchi (-) wheezing      Cardiovascular Exercise Tolerance: Good hypertension, Pt. on medications (-) CAD, (-) Past MI, (-) Cardiac Stents and (-) CABG  Rhythm:Regular Rate:Normal - Systolic murmurs and - Diastolic murmurs    Neuro/Psych neg Seizures PSYCHIATRIC DISORDERS Anxiety Depression negative neurological ROS     GI/Hepatic negative GI ROS, Neg liver ROS,   Endo/Other  neg diabetesHypothyroidism   Renal/GU negative Renal ROS     Musculoskeletal  (+) Arthritis ,   Abdominal (+) - obese,   Peds  Hematology negative hematology ROS (+)   Anesthesia Other Findings Past Medical History: No date: Anxiety No date: Arthritis     Comment:  right knee No date: Asthma No date: HTN (hypertension) No date: Thyroid disease   Reproductive/Obstetrics                             Anesthesia Physical Anesthesia Plan  ASA: III  Anesthesia Plan: General   Post-op Pain Management:    Induction: Intravenous  PONV Risk Score and Plan: 2  Airway Management Planned: Mask  Additional Equipment:   Intra-op Plan:   Post-operative Plan:   Informed Consent: I have reviewed the patients History and Physical, chart, labs and discussed the procedure including the risks, benefits and alternatives for the proposed anesthesia with the patient or authorized representative who has indicated his/her understanding and acceptance.     Dental advisory given  Plan Discussed with:  CRNA and Anesthesiologist  Anesthesia Plan Comments:         Anesthesia Quick Evaluation

## 2019-05-03 NOTE — H&P (Signed)
Wanda Ochoa is an 74 y.o. female.   Chief Complaint: return of ruminative anxiety HPI: hx of recurrent anxious depression  Past Medical History:  Diagnosis Date  . Anxiety   . Arthritis    right knee  . Asthma   . HTN (hypertension)   . Thyroid disease     Past Surgical History:  Procedure Laterality Date  . ABDOMINAL HYSTERECTOMY  1990  . BLADDER SUSPENSION  1990  . BREAST CYST ASPIRATION Right 1985  . BREAST EXCISIONAL BIOPSY Right 1967   EXCISIONAL BX - NEG  . BREAST SURGERY Right 1967   Lumpectomy  . CESAREAN SECTION    . COLONOSCOPY WITH PROPOFOL N/A 12/18/2018   Procedure: COLONOSCOPY WITH PROPOFOL;  Surgeon: Lollie Sails, MD;  Location: [facility] ENDOSCOPY;  Service: Endoscopy;  Laterality: N/A;  . HEMORRHOIDECTOMY WITH HEMORRHOID BANDING  1988    Family History  Problem Relation Age of Onset  . Depression Father   . Suicidality Father   . Depression Sister   . Breast cancer Sister 39  . Autism Son   . Heart disease Mother   . Colon cancer Mother   . Hypertension Mother   . Anxiety disorder Mother   . Mental illness Daughter   . Allergies Brother   . Allergies Sister   . Asthma Maternal Uncle   . Heart disease Maternal Grandfather   . Heart disease Maternal Grandmother   . Breast cancer Paternal Grandmother   . Breast cancer Other    Social History:  reports that she has never smoked. She has never used smokeless tobacco. She reports that she does not drink alcohol or use drugs.  Allergies:  Allergies  Allergen Reactions  . Penicillins Anaphylaxis    (Not in a hospital admission)   No results found for this or any previous visit (from the past 48 hour(s)). No results found.  Review of Systems  Constitutional: Negative.   HENT: Negative.   Eyes: Negative.   Respiratory: Negative.   Cardiovascular: Negative.   Gastrointestinal: Negative.   Musculoskeletal: Negative.   Skin: Negative.   Neurological: Negative.    Psychiatric/Behavioral: Positive for dysphoric mood and sleep disturbance. The patient is nervous/anxious.     Blood pressure (!) 168/74, pulse 80, temperature (!) 96.1 F (35.6 C), temperature source Oral, resp. rate 18, height 5\' 9"  (1.753 m), weight 81.2 kg, SpO2 98 %. Physical Exam  Nursing note and vitals reviewed. Constitutional: She appears well-developed and well-nourished.  HENT:  Head: Normocephalic and atraumatic.  Eyes: Pupils are equal, round, and reactive to light. Conjunctivae are normal.  Cardiovascular: Regular rhythm and normal heart sounds.  Respiratory: Effort normal.  GI: Soft.  Musculoskeletal:        General: Normal range of motion.     Cervical back: Normal range of motion.  Neurological: She is alert.  Skin: Skin is warm and dry.  Psychiatric: Judgment normal. Her mood appears anxious. Her affect is blunt. Her speech is delayed. She is slowed. Thought content is not paranoid. She expresses no homicidal and no suicidal ideation. She exhibits abnormal recent memory.     Assessment/Plan ECT today and Wednesday  [name], MD 05/03/2019, 9:58 AM

## 2019-05-04 ENCOUNTER — Encounter: Payer: Self-pay | Admitting: Physical Therapy

## 2019-05-04 ENCOUNTER — Ambulatory Visit: Payer: Medicare Other | Attending: Family Medicine | Admitting: Physical Therapy

## 2019-05-04 ENCOUNTER — Other Ambulatory Visit: Payer: Self-pay | Admitting: Psychiatry

## 2019-05-04 DIAGNOSIS — R2689 Other abnormalities of gait and mobility: Secondary | ICD-10-CM

## 2019-05-04 DIAGNOSIS — R262 Difficulty in walking, not elsewhere classified: Secondary | ICD-10-CM | POA: Insufficient documentation

## 2019-05-04 DIAGNOSIS — M6281 Muscle weakness (generalized): Secondary | ICD-10-CM | POA: Diagnosis not present

## 2019-05-04 NOTE — Patient Instructions (Signed)
Heel Raise: Bilateral (Standing)    Rise on balls of feet.  Hold for 2 seconds Repeat __20__ times per set. Do 1 sets per session. Do __2__ sessions per day.  [email or web address].exer.[email or web address]   Copyright  VHI. All rights reserved.

## 2019-05-04 NOTE — Therapy (Addendum)
[place] MAIN [facility] SERVICES [address] [place], [place], [address] Phone: [phone]   Fax:  [phone]  Physical Therapy Evaluation  Patient Details  Name: Wanda Ochoa MRN: [id number] Date of Birth: 1945/08/18 Referring Provider (PT): Lavon Paganini   Encounter Date: 05/04/2019  PT End of Session - 05/04/19 1343    Visit Number  1    Number of Visits  17    Date for PT Re-Evaluation  06/29/19    PT Start Time  0137    PT Stop Time  0237    PT Time Calculation (min)  60 min    Equipment Utilized During Treatment  Gait belt    Activity Tolerance  Patient tolerated treatment well       Past Medical History:  Diagnosis Date  . Anxiety   . Arthritis    right knee  . Asthma   . HTN (hypertension)   . Thyroid disease     Past Surgical History:  Procedure Laterality Date  . ABDOMINAL HYSTERECTOMY  1990  . BLADDER SUSPENSION  1990  . BREAST CYST ASPIRATION Right 1985  . BREAST EXCISIONAL BIOPSY Right 1967   EXCISIONAL BX - NEG  . BREAST SURGERY Right 1967   Lumpectomy  . CESAREAN SECTION    . COLONOSCOPY WITH PROPOFOL N/A 12/18/2018   Procedure: COLONOSCOPY WITH PROPOFOL;  Surgeon: Lollie Sails, MD;  Location: [facility] ENDOSCOPY;  Service: Endoscopy;  Laterality: N/A;  . HEMORRHOIDECTOMY WITH HEMORRHOID BANDING  1988    There were no vitals filed for this visit.   Subjective Assessment - 05/04/19 1342    Subjective  Patient reports that she has been falling at home.    Pertinent History  Patient is having some falls and had 4 falls in the last month. She was indoors and she did not get hurt. She does not use an AD.    Limitations  Walking    How long can you sit comfortably?  unlimited    How long can you stand comfortably?  unlimited    Patient Stated Goals  to not fall    Currently in Pain?  No/denies    Multiple Pain Sites  No         [facility] PT Assessment - 05/04/19 1346      Assessment   Medical Diagnosis  imbalance    Referring Provider (PT)  Lavon Paganini    Onset Date/Surgical Date  04/30/19    Hand Dominance  Right    Prior Therapy  no      Precautions   Precautions  Fall      Restrictions   Weight Bearing Restrictions  No      Balance Screen   Has the patient fallen in the past 6 months  Yes    How many times?  4    Has the patient had a decrease in activity level because of a fear of falling?   No    Is the patient reluctant to leave their home because of a fear of falling?   No      Home Social worker  Private residence    Living Arrangements  Spouse/significant other    Available Help at Discharge  Family    Type of [place] to enter    Entrance Stairs-Number of Steps  [place]  Right    Home  Layout  One level    Home Equipment  Grab bars - tub/shower      Prior Function   Level of Independence  Independent    Vocation  Retired    Leisure  walk, puzzles      Cognition   Overall Cognitive Status  Within Functional Limits for tasks assessed      Standardized Balance Assessment   Standardized Balance Assessment  Berg Balance Test      Berg Balance Test   Sit to Stand  Able to stand without using hands and stabilize independently    Standing Unsupported  Able to stand safely 2 minutes    Sitting with Back Unsupported but Feet Supported on Floor or Stool  Able to sit safely and securely 2 minutes    Stand to Sit  Sits safely with minimal use of hands    Transfers  Able to transfer safely, minor use of hands    Standing Unsupported with Eyes Closed  Able to stand 10 seconds safely    Standing Unsupported with Feet Together  Able to place feet together independently and stand 1 minute safely    From Standing, Reach Forward with Outstretched Arm  Can reach forward >12 cm safely (5")    From Standing Position, Pick up Object from Floor  Able to pick up shoe safely and easily    From  Standing Position, Turn to Look Behind Over each Shoulder  Looks behind one side only/other side shows less weight shift    Turn 360 Degrees  Able to turn 360 degrees safely in 4 seconds or less    Standing Unsupported, Alternately Place Feet on Step/Stool  Able to stand independently and safely and complete 8 steps in 20 seconds    Standing Unsupported, One Foot in Front  Needs help to step but can hold 15 seconds    Standing on One Leg  Unable to try or needs assist to prevent fall    Total Score  47         POSTURE: WNL   PROM/AROM: WNL BUE and BLE  STRENGTH:  Graded on a 0-5 scale Muscle Group Left Right                          Hip Flex 4/5 4/5  Hip Abd 4/5 4/5  Hip Add    Hip Ext    Hip IR/ER    Knee Flex 5/5 5/5  Knee Ext 5/5 5/5  Ankle DF 4/5 4/5  Ankle PF -3/5 -3/5   SENSATION:  Tingling BLE feet    BALANCE: Berg balance 47/56   GAIT: Patient has decreased gait speed without AD  OUTCOME MEASURES: TEST Outcome Interpretation  5 times sit<>stand 17.09sec >60 yo, >15 sec indicates increased risk for falls  10 meter walk test     1.14            m/s <1.0 m/s indicates increased risk for falls; limited community ambulator  Timed up and Go        12.20         sec <14 sec indicates increased risk for falls  6 minute walk test      1340          Feet 1000 feet is community Water quality scientist 47/56 <36/56 (100% risk for falls), 37-45 (80% risk for falls); 46-51 (>50% risk for falls); 52-55 (lower risk <25% of falls)  Treatment: Heel raises with 2 sec hold x 20  Patient performed with instruction, verbal cues, tactile cues of therapist: goal: increase tissue extensibility, promote proper posture, improve mobility    photo score:  [name] Physical FS Primary Measure 76 [name] intake functional measure is 76 out of 100 (higher number = greater function). Risk Adjusted Statistical FOTO* 64 G    Objective measurements completed  on examination: See above findings.              PT Education - 05/04/19 1343    Education Details  plan of care    Person(s) Educated  Patient    Methods  Explanation    Comprehension  Verbalized understanding       PT Short Term Goals - 05/04/19 1427      PT SHORT TERM GOAL #1   Title  Patient will be independent in home exercise program to improve strength/mobility for better functional independence with ADLs.    Period  Weeks    Status  New    Target Date  06/01/19        PT Long Term Goals - 05/04/19 1428      PT LONG TERM GOAL #1   Title  Patient (> 1 years old) will complete five times sit to stand test in < 15 seconds indicating an increased LE strength and improved balance.    Time  8    Period  Weeks    Status  New    Target Date  06/29/19      PT LONG TERM GOAL #2   Title  Patient will increase Berg Balance score by > 6 points to demonstrate decreased fall risk during functional activities.    Time  8    Period  Weeks    Status  New    Target Date  06/29/19      PT LONG TERM GOAL #3   Title  Patient will reduce timed up and go to <11 seconds to reduce fall risk and demonstrate improved transfer/gait ability.    Time  8    Period  Weeks    Status  New    Target Date  06/29/19             Plan - 05/04/19 1344    Clinical Impression Statement  PT examination reveals grossly weak BLE feet with moderate UE reliance for sit to stand transfers. Patient has significantly impaired balance, decreased LE power noted with 5 x sit to stand, and TUG  with primary difficulty with performing sit to stand phase of test. Gait speed and 6 MW test is also below age/gender normal.  Pt will benefit from skilled PT services to address deficits in balance and strength in order to improve function and decrease fall risk.      Personal Factors and Comorbidities  Age    Stability/Clinical Decision Making  Stable/Uncomplicated    Clinical Decision Making  Low     Rehab Potential  Poor    PT Frequency  2x / week    PT Duration  8 weeks    PT Treatment/Interventions  Patient/family education;Balance training;Neuromuscular re-education;Manual techniques;Gait training;Therapeutic activities;Therapeutic exercise    PT Next Visit Plan  balance and strengthening    Consulted and Agree with Plan of Care  Patient       Patient will benefit from skilled therapeutic intervention in order to improve the following deficits and impairments:  Abnormal gait, Difficulty walking, Decreased strength, Impaired sensation  Visit Diagnosis: Other abnormalities of gait and mobility  Difficulty in walking, not elsewhere classified  Muscle weakness (generalized)     Problem List Patient Active Problem List   Diagnosis Date Noted  . Idiopathic gout involving toe of right foot 12/07/2018  . Effusion of right knee 06/18/2017  . Hematuria 09/11/2016  . Severe recurrent major depression without psychotic features ([place])   . Hypercholesteremia 05/02/2015  . Mitral valve prolapse 02/21/2015  . Allergic rhinitis 12/24/2014  . Osteopenia 12/24/2014  . Hypothyroidism 11/24/2014  . Generalized anxiety disorder 02/24/2012  . Nocturnal hypoxemia 01/20/2012  . Abdominal mass 01/20/2012  . Temporary cerebral vascular dysfunction 01/02/2009  . Benign hypertension 03/28/2008  . Asthma, exogenous 10/22/2007  . Adaptive colitis 03/12/2003    [name], PT DPT 05/04/2019, 2:36 PM  [place] MAIN [facility] SERVICES [address] [place], [place], [address] Phone: [phone]   Fax:  [phone]  Name: Wanda Ochoa MRN: [id number] Date of Birth: 13-Jan-1946

## 2019-05-05 ENCOUNTER — Encounter: Payer: Self-pay | Admitting: Anesthesiology

## 2019-05-05 ENCOUNTER — Ambulatory Visit: Payer: Self-pay | Admitting: Anesthesiology

## 2019-05-05 ENCOUNTER — Encounter (HOSPITAL_BASED_OUTPATIENT_CLINIC_OR_DEPARTMENT_OTHER)
Admission: RE | Admit: 2019-05-05 | Discharge: 2019-05-05 | Disposition: A | Discharge status: Home / Self Care | Payer: Medicare Other | Source: Ambulatory Visit | Attending: Psychiatry | Admitting: Psychiatry

## 2019-05-05 ENCOUNTER — Other Ambulatory Visit: Payer: Self-pay

## 2019-05-05 DIAGNOSIS — E079 Disorder of thyroid, unspecified: Secondary | ICD-10-CM | POA: Diagnosis not present

## 2019-05-05 DIAGNOSIS — Z7901 Long term (current) use of anticoagulants: Secondary | ICD-10-CM | POA: Diagnosis not present

## 2019-05-05 DIAGNOSIS — I10 Essential (primary) hypertension: Secondary | ICD-10-CM | POA: Diagnosis not present

## 2019-05-05 DIAGNOSIS — E785 Hyperlipidemia, unspecified: Secondary | ICD-10-CM | POA: Diagnosis not present

## 2019-05-05 DIAGNOSIS — F419 Anxiety disorder, unspecified: Secondary | ICD-10-CM | POA: Diagnosis not present

## 2019-05-05 DIAGNOSIS — E039 Hypothyroidism, unspecified: Secondary | ICD-10-CM | POA: Diagnosis not present

## 2019-05-05 DIAGNOSIS — J45909 Unspecified asthma, uncomplicated: Secondary | ICD-10-CM | POA: Diagnosis not present

## 2019-05-05 DIAGNOSIS — F418 Other specified anxiety disorders: Secondary | ICD-10-CM | POA: Diagnosis not present

## 2019-05-05 DIAGNOSIS — F339 Major depressive disorder, recurrent, unspecified: Secondary | ICD-10-CM | POA: Diagnosis not present

## 2019-05-05 MED ORDER — SUCCINYLCHOLINE CHLORIDE 20 MG/ML IJ SOLN
INTRAMUSCULAR | Status: AC
  Filled 2019-05-05: qty 1

## 2019-05-05 MED ORDER — SODIUM CHLORIDE 0.9 % IV SOLN
500.0000 mL | Freq: Once | INTRAVENOUS | Status: AC
  Administered 2019-05-05: 500 mL via INTRAVENOUS

## 2019-05-05 MED ORDER — LABETALOL HCL 5 MG/ML IV SOLN
INTRAVENOUS | Status: AC
  Filled 2019-05-05: qty 4

## 2019-05-05 MED ORDER — SUCCINYLCHOLINE CHLORIDE 20 MG/ML IJ SOLN
INTRAMUSCULAR | Status: DC | PRN
  Administered 2019-05-05: 80 mg via INTRAVENOUS

## 2019-05-05 MED ORDER — METHOHEXITAL SODIUM 0.5 G IJ SOLR
INTRAMUSCULAR | Status: AC
  Filled 2019-05-05: qty 500

## 2019-05-05 MED ORDER — METHOHEXITAL SODIUM 100 MG/10ML IV SOSY
PREFILLED_SYRINGE | INTRAVENOUS | Status: DC | PRN
  Administered 2019-05-05: 80 mg via INTRAVENOUS

## 2019-05-05 MED ORDER — KETOROLAC TROMETHAMINE 30 MG/ML IJ SOLN: 30.0000 mg | Freq: Once | INTRAMUSCULAR | Status: DC

## 2019-05-05 MED ORDER — LABETALOL HCL 5 MG/ML IV SOLN
INTRAVENOUS | Status: DC | PRN
  Administered 2019-05-05: 5 mg via INTRAVENOUS

## 2019-05-05 NOTE — Procedures (Signed)
ECT SERVICES Physician's Interval Evaluation & Treatment Note  Patient Identification: Jessicaanne Achord C. [place] MRN:  [id number] Date of Evaluation:  05/05/2019 TX #: 2  MADRS:   MMSE:   P.E. Findings:  Physical exam unremarkable  Psychiatric Interval Note:  She feels slightly less anxious.  Still had a little bit of down mood yesterday.  Minor memory complaints  Subjective:  Patient is a 74 y.o. female seen for evaluation for Electroconvulsive Therapy. Overall seems a little bit better  Treatment Summary:   [x]   Right Unilateral             []  Bilateral   % Energy : 0.3 ms 75%   Impedance: 1470 ohms  Seizure Energy Index: No reading  Postictal Suppression Index: No reading  Seizure Concordance Index: 68% but I doubt this is very accurate either.  One of the leads on the head came off and our measurements were bad on everything.  Medications  Pre Shock: Toradol 30 mg Brevital 80 mg succinylcholine 80 mg labetalol 5 mg  Post Shock:    Seizure Duration: 28 seconds by measured movement 48 seconds by EEG to my reading   Comments: We will see her back on Friday and that may conclude this series  Lungs:  [x]   Clear to auscultation               []  Other:   Heart:    [x]   Regular rhythm             []  irregular rhythm    [x]   Previous H&P reviewed, patient examined and there are NO CHANGES                 []   Previous H&P reviewed, patient examined and there are changes noted.   [name], MD 2/10/202110:28 AM

## 2019-05-05 NOTE — Discharge Instructions (Signed)
1)  The drugs that you have been given will stay in your system until tomorrow so for the       next 24 hours you should not:  A. Drive an automobile  B. Make any legal decisions  C. Drink any alcoholic beverages  2)  You may resume your regular meals upon return home.  3)  A responsible adult must take you home.  Someone should stay with you for a few          hours, then be available by phone for the remainder of the treatment day.  4)  You May experience any of the following symptoms:  Headache, Nausea and a dry mouth (due to the medications you were given),  temporary memory loss and some confusion, or sore muscles (a warm bath  should help this).  If you you experience any of these symptoms let us know on                your return visit.  5)  Report any of the following: any acute discomfort, severe headache, or temperature        greater than 100.5 F.   Also report any unusual redness, swelling, drainage, or pain         at your IV site.    You may report Symptoms to:  [place] [facility]          Phone: [phone], ECT Department           or Dr. Prescott Gum office [phone]  6)  Your next ECT Treatment is Friday February 12 at 8:30  We will call 2 days prior to your scheduled appointment for arrival times.  7)  Nothing to eat or drink after midnight the night before your procedure.  8)  TAKE YOUR BLOOD PRESSURE MEDICATION  with a sip of water the morning of your procedure.  9)  Other Instructions: Call [phone] to cancel the morning of your procedure due         to illness or emergency.  10) We will call within 72 hours to assess how you are feeling.

## 2019-05-05 NOTE — H&P (Signed)
Wanda Ochoa is an 74 y.o. female.   Chief Complaint: Patient has no specific new complaint.  She continues with anxiety HPI: History of recurrent anxiety and depression with risk of catatonia  Past Medical History:  Diagnosis Date  . Anxiety   . Arthritis    right knee  . Asthma   . HTN (hypertension)   . Thyroid disease     Past Surgical History:  Procedure Laterality Date  . ABDOMINAL HYSTERECTOMY  1990  . BLADDER SUSPENSION  1990  . BREAST CYST ASPIRATION Right 1985  . BREAST EXCISIONAL BIOPSY Right 1967   EXCISIONAL BX - NEG  . BREAST SURGERY Right 1967   Lumpectomy  . CESAREAN SECTION    . COLONOSCOPY WITH PROPOFOL N/A 12/18/2018   Procedure: COLONOSCOPY WITH PROPOFOL;  Surgeon: Lollie Sails, MD;  Location: [facility] ENDOSCOPY;  Service: Endoscopy;  Laterality: N/A;  . HEMORRHOIDECTOMY WITH HEMORRHOID BANDING  1988    Family History  Problem Relation Age of Onset  . Depression Father   . Suicidality Father   . Depression Sister   . Breast cancer Sister 75  . Autism Son   . Heart disease Mother   . Colon cancer Mother   . Hypertension Mother   . Anxiety disorder Mother   . Mental illness Daughter   . Allergies Brother   . Allergies Sister   . Asthma Maternal Uncle   . Heart disease Maternal Grandfather   . Heart disease Maternal Grandmother   . Breast cancer Paternal Grandmother   . Breast cancer Other    Social History:  reports that she has never smoked. She has never used smokeless tobacco. She reports that she does not drink alcohol or use drugs.  Allergies:  Allergies  Allergen Reactions  . Penicillins Anaphylaxis    (Not in a hospital admission)   No results found for this or any previous visit (from the past 48 hour(s)). No results found.  Review of Systems  Constitutional: Negative.   HENT: Negative.   Eyes: Negative.   Respiratory: Negative.   Cardiovascular: Negative.   Gastrointestinal: Negative.   Musculoskeletal:  Negative.   Skin: Negative.   Neurological: Negative.   Psychiatric/Behavioral: Positive for dysphoric mood. The patient is nervous/anxious.     Blood pressure (!) 167/79, pulse 70, temperature 98.5 F (36.9 C), temperature source Oral, resp. rate 18, SpO2 100 %. Physical Exam  Nursing note and vitals reviewed. Constitutional: She appears well-developed and well-nourished.  HENT:  Head: Normocephalic and atraumatic.  Eyes: Pupils are equal, round, and reactive to light. Conjunctivae are normal.  Cardiovascular: Normal heart sounds.  Respiratory: Effort normal.  GI: Soft.  Musculoskeletal:        General: Normal range of motion.     Cervical back: Normal range of motion.  Neurological: She is alert.  Skin: Skin is warm and dry.  Psychiatric: Her speech is normal and behavior is normal. Judgment and thought content normal. Her mood appears anxious. Her affect is blunt. She exhibits abnormal recent memory.     Assessment/Plan Patient will have treatment today and we are scheduling her again on Wednesday.  Patient and I agree that this would be a good judgment to try and pull her completely out of this before she develops a catatonic episode again.  [name], MD 05/05/2019, 10:15 AM

## 2019-05-05 NOTE — Transfer of Care (Signed)
Immediate Anesthesia Transfer of Care Note  Patient: Wanda Ochoa. Poster  Procedure(s) Performed: ECT TX  Patient Location: PACU  Anesthesia Type:General  Level of Consciousness: awake and alert   Airway & Oxygen Therapy: Patient Spontanous Breathing and Patient connected to face mask oxygen  Post-op Assessment: Report given to RN  Post vital signs: Reviewed and stable  Last Vitals:  Vitals Value Taken Time  BP 184/88 05/05/19 1051  Temp 36.9 C 05/05/19 1050  Pulse 76 05/05/19 1053  Resp 18 05/05/19 1053  SpO2 100 % 05/05/19 1053  Vitals shown include unvalidated device data.  Last Pain:  Vitals:   05/05/19 1050  TempSrc:   PainSc: Asleep         Complications: No apparent anesthesia complications

## 2019-05-05 NOTE — Procedures (Signed)
ECT SERVICES Physician's Interval Evaluation & Treatment Note  Patient Identification: Wanda Ochoa MRN:  [id number] Date of Evaluation:  05/03/2019 TX #: 1  MADRS:   MMSE:   P.E. Findings:  stable  Psychiatric Interval Note:  Anxious and depressed  Subjective:  Patient is a 74 y.o. female seen for evaluation for Electroconvulsive Therapy. anxious  Treatment Summary:   [x]   Right Unilateral             []  Bilateral   % Energy : 0.27ms 75%    Impedance: 1810ohms  Seizure Energy Index: 6186 mv2  Postictal Suppression Index: 71%  Seizure Concordance Index: 81%  Medications  Pre Shock: toradol 30mg , brevital 80mg , suc 80mg   Post Shock:    Seizure Duration: 42sec emg, 63 sec eeg   Comments: Follow up wednesday   Lungs:  [x]   Clear to auscultation               []  Other:   Heart:    [x]   Regular rhythm             []  irregular rhythm    [x]   Previous H&P reviewed, patient examined and there are NO CHANGES                 []   Previous H&P reviewed, patient examined and there are changes noted.   [name], MD 2/08/20219:48 AM

## 2019-05-05 NOTE — Anesthesia Preprocedure Evaluation (Signed)
Anesthesia Evaluation  Patient identified by MRN, date of birth, ID band Patient awake    Reviewed: Allergy & Precautions, NPO status , [name] Chart, lab work & pertinent test results  History of Anesthesia Complications Negative for: history of anesthetic complications  Airway Mallampati: II  TM Distance: >3 FB Neck ROM: Full    Dental no notable dental hx.    Pulmonary asthma ,    breath sounds clear to auscultation- rhonchi (-) wheezing      Cardiovascular Exercise Tolerance: Good hypertension, Pt. on medications (-) CAD, (-) Past MI, (-) Cardiac Stents and (-) CABG  Rhythm:Regular Rate:Normal - Systolic murmurs and - Diastolic murmurs    Neuro/Psych neg Seizures PSYCHIATRIC DISORDERS Anxiety Depression negative neurological ROS     GI/Hepatic negative GI ROS, Neg liver ROS,   Endo/Other  neg diabetesHypothyroidism   Renal/GU negative Renal ROS     Musculoskeletal  (+) Arthritis ,   Abdominal (+) - obese,   Peds  Hematology negative hematology ROS (+)   Anesthesia Other Findings Past Medical History: No date: Anxiety No date: Arthritis     Comment:  right knee No date: Asthma No date: HTN (hypertension) No date: Thyroid disease   Reproductive/Obstetrics                             Anesthesia Physical  Anesthesia Plan  ASA: III  Anesthesia Plan: General   Post-op Pain Management:    Induction: Intravenous  PONV Risk Score and Plan: 2  Airway Management Planned: Mask  Additional Equipment:   Intra-op Plan:   Post-operative Plan:   Informed Consent: I have reviewed the patients History and Physical, chart, labs and discussed the procedure including the risks, benefits and alternatives for the proposed anesthesia with the patient or authorized representative who has indicated his/her understanding and acceptance.     Dental advisory given  Plan Discussed with:  CRNA and Anesthesiologist  Anesthesia Plan Comments:         Anesthesia Quick Evaluation

## 2019-05-05 NOTE — H&P (Signed)
Wanda Ochoa is an 74 y.o. female.   Chief Complaint: Chronic recurrent depression and anxiety.  A little less anxious than last time. HPI: History of recurrent depression that has resulted in catatonic spells in the past  Past Medical History:  Diagnosis Date  . Anxiety   . Arthritis    right knee  . Asthma   . HTN (hypertension)   . Thyroid disease     Past Surgical History:  Procedure Laterality Date  . ABDOMINAL HYSTERECTOMY  1990  . BLADDER SUSPENSION  1990  . BREAST CYST ASPIRATION Right 1985  . BREAST EXCISIONAL BIOPSY Right 1967   EXCISIONAL BX - NEG  . BREAST SURGERY Right 1967   Lumpectomy  . CESAREAN SECTION    . COLONOSCOPY WITH PROPOFOL N/A 12/18/2018   Procedure: COLONOSCOPY WITH PROPOFOL;  Surgeon: Lollie Sails, MD;  Location: [facility] ENDOSCOPY;  Service: Endoscopy;  Laterality: N/A;  . HEMORRHOIDECTOMY WITH HEMORRHOID BANDING  1988    Family History  Problem Relation Age of Onset  . Depression Father   . Suicidality Father   . Depression Sister   . Breast cancer Sister 52  . Autism Son   . Heart disease Mother   . Colon cancer Mother   . Hypertension Mother   . Anxiety disorder Mother   . Mental illness Daughter   . Allergies Brother   . Allergies Sister   . Asthma Maternal Uncle   . Heart disease Maternal Grandfather   . Heart disease Maternal Grandmother   . Breast cancer Paternal Grandmother   . Breast cancer Other    Social History:  reports that she has never smoked. She has never used smokeless tobacco. She reports that she does not drink alcohol or use drugs.  Allergies:  Allergies  Allergen Reactions  . Penicillins Anaphylaxis    (Not in a hospital admission)   No results found for this or any previous visit (from the past 48 hour(s)). No results found.  Review of Systems  Constitutional: Negative.   HENT: Negative.   Eyes: Negative.   Respiratory: Negative.   Cardiovascular: Negative.   Gastrointestinal:  Negative.   Musculoskeletal: Negative.   Skin: Negative.   Neurological: Negative.   Psychiatric/Behavioral: Negative.     Blood pressure (!) 167/79, pulse 70, temperature 98.5 F (36.9 C), temperature source Oral, resp. rate 18, SpO2 100 %. Physical Exam  Nursing note and vitals reviewed. Constitutional: She appears well-developed and well-nourished.  HENT:  Head: Normocephalic and atraumatic.  Eyes: Pupils are equal, round, and reactive to light. Conjunctivae are normal.  Cardiovascular: Normal heart sounds.  Respiratory: Effort normal.  GI: Soft.  Musculoskeletal:        General: Normal range of motion.     Cervical back: Normal range of motion.  Neurological: She is alert.  Skin: Skin is warm and dry.  Psychiatric: Her mood appears anxious. She exhibits a depressed mood.     Assessment/Plan Treatment today with another treatment scheduled for Friday.  That may be all we need and we will reassess at that time.  [name], MD 05/05/2019, 10:26 AM

## 2019-05-06 ENCOUNTER — Other Ambulatory Visit: Payer: Self-pay | Admitting: Psychiatry

## 2019-05-06 NOTE — Anesthesia Postprocedure Evaluation (Signed)
Anesthesia Post Note  Patient: Wanda Ochoa. [place]  Procedure(s) Performed: ECT TX  Patient location during evaluation: PACU Anesthesia Type: General Level of consciousness: awake and alert Pain management: pain level controlled Vital Signs Assessment: post-procedure vital signs reviewed and stable Respiratory status: spontaneous breathing, nonlabored ventilation, respiratory function stable and patient connected to nasal cannula oxygen Cardiovascular status: blood pressure returned to baseline and stable Postop Assessment: no apparent nausea or vomiting Anesthetic complications: no     Last Vitals:  Vitals:   05/05/19 1120 05/05/19 1133  BP: (!) 177/80 (!) 172/86  Pulse: 71 71  Resp: 14 18  Temp: 37.4 C 37.1 C  SpO2: 98%     Last Pain:  Vitals:   05/05/19 1133  TempSrc: Oral  PainSc: 0-No pain                 Martha Clan

## 2019-05-07 ENCOUNTER — Encounter: Payer: Self-pay | Admitting: Registered Nurse

## 2019-05-07 ENCOUNTER — Other Ambulatory Visit: Payer: Self-pay

## 2019-05-07 ENCOUNTER — Encounter (HOSPITAL_BASED_OUTPATIENT_CLINIC_OR_DEPARTMENT_OTHER)
Admission: RE | Admit: 2019-05-07 | Discharge: 2019-05-07 | Disposition: A | Discharge status: Home / Self Care | Payer: Medicare Other | Source: Ambulatory Visit | Attending: Psychiatry | Admitting: Psychiatry

## 2019-05-07 DIAGNOSIS — F419 Anxiety disorder, unspecified: Secondary | ICD-10-CM | POA: Diagnosis not present

## 2019-05-07 DIAGNOSIS — I10 Essential (primary) hypertension: Secondary | ICD-10-CM | POA: Diagnosis not present

## 2019-05-07 DIAGNOSIS — J45909 Unspecified asthma, uncomplicated: Secondary | ICD-10-CM | POA: Diagnosis not present

## 2019-05-07 DIAGNOSIS — E079 Disorder of thyroid, unspecified: Secondary | ICD-10-CM | POA: Diagnosis not present

## 2019-05-07 DIAGNOSIS — F339 Major depressive disorder, recurrent, unspecified: Secondary | ICD-10-CM | POA: Diagnosis not present

## 2019-05-07 DIAGNOSIS — F332 Major depressive disorder, recurrent severe without psychotic features: Secondary | ICD-10-CM | POA: Diagnosis not present

## 2019-05-07 DIAGNOSIS — Z7901 Long term (current) use of anticoagulants: Secondary | ICD-10-CM | POA: Diagnosis not present

## 2019-05-07 MED ORDER — SODIUM CHLORIDE 0.9 % IV SOLN
500.0000 mL | Freq: Once | INTRAVENOUS | Status: AC
  Administered 2019-05-07: 500 mL via INTRAVENOUS

## 2019-05-07 MED ORDER — KETOROLAC TROMETHAMINE 30 MG/ML IJ SOLN
30.0000 mg | Freq: Once | INTRAMUSCULAR | Status: AC
  Administered 2019-05-07: 30 mg via INTRAVENOUS

## 2019-05-07 MED ORDER — SUCCINYLCHOLINE CHLORIDE 20 MG/ML IJ SOLN
INTRAMUSCULAR | Status: DC | PRN
  Administered 2019-05-07: 80 mg via INTRAVENOUS

## 2019-05-07 MED ORDER — METHOHEXITAL SODIUM 100 MG/10ML IV SOSY
PREFILLED_SYRINGE | INTRAVENOUS | Status: DC | PRN
  Administered 2019-05-07: 80 mg via INTRAVENOUS

## 2019-05-07 MED ORDER — LABETALOL HCL 5 MG/ML IV SOLN
INTRAVENOUS | Status: DC | PRN
  Administered 2019-05-07: 5 mg via INTRAVENOUS

## 2019-05-07 MED ORDER — SODIUM CHLORIDE 0.9 % IV SOLN: INTRAVENOUS | Status: DC | PRN

## 2019-05-07 MED ORDER — KETOROLAC TROMETHAMINE 30 MG/ML IJ SOLN
INTRAMUSCULAR | Status: AC
  Filled 2019-05-07: qty 1

## 2019-05-07 NOTE — Anesthesia Preprocedure Evaluation (Signed)
Anesthesia Evaluation  Patient identified by MRN, date of birth, ID band Patient awake    Reviewed: Allergy & Precautions, NPO status , [name] Chart, lab work & pertinent test results  History of Anesthesia Complications Negative for: history of anesthetic complications  Airway Mallampati: II  TM Distance: >3 FB Neck ROM: Full    Dental no notable dental hx.    Pulmonary asthma ,    breath sounds clear to auscultation- rhonchi (-) wheezing      Cardiovascular Exercise Tolerance: Good hypertension, Pt. on medications (-) CAD, (-) Past MI, (-) Cardiac Stents and (-) CABG  Rhythm:Regular Rate:Normal - Systolic murmurs and - Diastolic murmurs    Neuro/Psych neg Seizures PSYCHIATRIC DISORDERS Anxiety Depression negative neurological ROS     GI/Hepatic negative GI ROS, Neg liver ROS,   Endo/Other  neg diabetesHypothyroidism   Renal/GU negative Renal ROS     Musculoskeletal  (+) Arthritis ,   Abdominal (+) - obese,   Peds  Hematology negative hematology ROS (+)   Anesthesia Other Findings Past Medical History: No date: Anxiety No date: Arthritis     Comment:  right knee No date: Asthma No date: HTN (hypertension) No date: Thyroid disease   Reproductive/Obstetrics                             Anesthesia Physical  Anesthesia Plan  ASA: III  Anesthesia Plan: General   Post-op Pain Management:    Induction: Intravenous  PONV Risk Score and Plan: 2  Airway Management Planned: Mask  Additional Equipment:   Intra-op Plan:   Post-operative Plan:   Informed Consent: I have reviewed the patients History and Physical, chart, labs and discussed the procedure including the risks, benefits and alternatives for the proposed anesthesia with the patient or authorized representative who has indicated his/her understanding and acceptance.     Dental advisory given  Plan Discussed with:  CRNA and Anesthesiologist  Anesthesia Plan Comments:         Anesthesia Quick Evaluation

## 2019-05-07 NOTE — Anesthesia Procedure Notes (Signed)
Performed by: Anastasios Melander, CRNA Pre-anesthesia Checklist: Patient identified, Emergency Drugs available, Suction available and Patient being monitored Patient Re-evaluated:Patient Re-evaluated prior to induction Oxygen Delivery Method: Circle system utilized Preoxygenation: Pre-oxygenation with 100% oxygen Induction Type: IV induction Ventilation: Mask ventilation without difficulty and Mask ventilation throughout procedure Airway Equipment and Method: Bite block Placement Confirmation: positive ETCO2 Dental Injury: Teeth and Oropharynx as per pre-operative assessment        

## 2019-05-07 NOTE — Discharge Instructions (Signed)
1)  The drugs that you have been given will stay in your system until tomorrow so for the       next 24 hours you should not:  A. Drive an automobile  B. Make any legal decisions  C. Drink any alcoholic beverages  2)  You may resume your regular meals upon return home.  3)  A responsible adult must take you home.  Someone should stay with you for a few          hours, then be available by phone for the remainder of the treatment day.  4)  You May experience any of the following symptoms:  Headache, Nausea and a dry mouth (due to the medications you were given),  temporary memory loss and some confusion, or sore muscles (a warm bath  should help this).  If you you experience any of these symptoms let us know on                your return visit.  5)  Report any of the following: any acute discomfort, severe headache, or temperature        greater than 100.5 F.   Also report any unusual redness, swelling, drainage, or pain         at your IV site.    You may report Symptoms to:  [place] at [facility]          Phone: [phone], ECT Department           or Dr. Prescott Gum office [phone]  6)  Your next ECT Treatment is when you call to make an appointment   We will call 2 days prior to your scheduled appointment for arrival times.  7)  Nothing to eat or drink after midnight the night before your procedure.  8)  Take blood pressure with a sip of water the morning of your procedure.  9)  Other Instructions: Call [phone] to cancel the morning of your procedure due         to illness or emergency.  10) We will call within 72 hours to assess how you are feeling.

## 2019-05-07 NOTE — H&P (Signed)
Wanda Ochoa is an 74 y.o. female.   Chief Complaint: feeling better with improved anxirty HPI: hx of recurrent severe depression with a hx a catatonia under stress  Past Medical History:  Diagnosis Date  . Anxiety   . Arthritis    right knee  . Asthma   . HTN (hypertension)   . Thyroid disease     Past Surgical History:  Procedure Laterality Date  . ABDOMINAL HYSTERECTOMY  1990  . BLADDER SUSPENSION  1990  . BREAST CYST ASPIRATION Right 1985  . BREAST EXCISIONAL BIOPSY Right 1967   EXCISIONAL BX - NEG  . BREAST SURGERY Right 1967   Lumpectomy  . CESAREAN SECTION    . COLONOSCOPY WITH PROPOFOL N/A 12/18/2018   Procedure: COLONOSCOPY WITH PROPOFOL;  Surgeon: Lollie Sails, MD;  Location: [facility] ENDOSCOPY;  Service: Endoscopy;  Laterality: N/A;  . HEMORRHOIDECTOMY WITH HEMORRHOID BANDING  1988    Family History  Problem Relation Age of Onset  . Depression Father   . Suicidality Father   . Depression Sister   . Breast cancer Sister 49  . Autism Son   . Heart disease Mother   . Colon cancer Mother   . Hypertension Mother   . Anxiety disorder Mother   . Mental illness Daughter   . Allergies Brother   . Allergies Sister   . Asthma Maternal Uncle   . Heart disease Maternal Grandfather   . Heart disease Maternal Grandmother   . Breast cancer Paternal Grandmother   . Breast cancer Other    Social History:  reports that she has never smoked. She has never used smokeless tobacco. She reports that she does not drink alcohol or use drugs.  Allergies:  Allergies  Allergen Reactions  . Penicillins Anaphylaxis    (Not in a hospital admission)   No results found for this or any previous visit (from the past 48 hour(s)). No results found.  Review of Systems  Constitutional: Negative.   HENT: Negative.   Eyes: Negative.   Respiratory: Negative.   Cardiovascular: Negative.   Gastrointestinal: Negative.   Musculoskeletal: Negative.   Skin: Negative.    Neurological: Negative.   Psychiatric/Behavioral: Negative.     Blood pressure (!) 167/86, pulse 68, temperature 97.9 F (36.6 C), temperature source Oral, resp. rate 18, height 5\' 9"  (1.753 m), weight 80.7 kg, SpO2 100 %. Physical Exam  Nursing note and vitals reviewed. Constitutional: She appears well-developed and well-nourished.  HENT:  Head: Normocephalic and atraumatic.  Eyes: Pupils are equal, round, and reactive to light. Conjunctivae are normal.  Cardiovascular: Regular rhythm and normal heart sounds.  Respiratory: Effort normal.  GI: Soft.  Musculoskeletal:        General: Normal range of motion.     Cervical back: Normal range of motion.  Neurological: She is alert.  Skin: Skin is warm and dry.  Psychiatric: She has a normal mood and affect. Her speech is normal and behavior is normal. Judgment normal. Her affect is not blunt. Thought content is not paranoid and not delusional. She expresses no homicidal and no suicidal ideation. She exhibits abnormal recent memory.     Assessment/Plan We discussed options, and we agree that a reasonable plan is to stop ECT after this treatment and leave it open ended that she or husband or Dr Nicolasa Ducking can let me know if there are signs of needing more ECT  Wanda Berthold, MD 05/07/2019, 9:49 AM

## 2019-05-07 NOTE — Procedures (Signed)
ECT SERVICES Physician's Interval Evaluation & Treatment Note  Patient Identification: Wanda Ochoa MRN:  [id number] Date of Evaluation:  05/07/2019 TX #: 3  MADRS:   MMSE:   P.E. Findings:  No change physical exam.  Blood pressure stable  Psychiatric Interval Note:  Feeling better less anxious  Subjective:  Patient is a 74 y.o. female seen for evaluation for Electroconvulsive Therapy. Doing much better only minor memory problems  Treatment Summary:   [x]   Right Unilateral             []  Bilateral   % Energy : 0.3 ms 75%   Impedance: 2290 ohms  Seizure Energy Index: No reading but it looks like a good seizure  Postictal Suppression Index: No reading  Seizure Concordance Index: No reading  Medications  Pre Shock: Toradol 30 mg Brevital 80 mg succinylcholine 80 mg  Post Shock:    Seizure Duration: 27 seconds EMG 36 seconds EEG   Comments: Once again unfortunately we did not have good readings from our leads but it was certainly good enough to see that she had an adequate seizure and to read the ending points of it.  We have decided that we will stop this brief course of treatment and leave it as as needed going forward.  Lungs:  [x]   Clear to auscultation               []  Other:   Heart:    [x]   Regular rhythm             []  irregular rhythm    [x]   Previous H&P reviewed, patient examined and there are NO CHANGES                 []   Previous H&P reviewed, patient examined and there are changes noted.   [name], MD 2/12/202110:30 AM

## 2019-05-07 NOTE — Anesthesia Postprocedure Evaluation (Signed)
Anesthesia Post Note  Patient: Wanda Ochoa  Procedure(s) Performed: ECT TX  Patient location during evaluation: PACU Anesthesia Type: General Level of consciousness: awake and alert Pain management: pain level controlled Vital Signs Assessment: post-procedure vital signs reviewed and stable Respiratory status: spontaneous breathing, nonlabored ventilation and respiratory function stable Cardiovascular status: blood pressure returned to baseline and stable Postop Assessment: no signs of nausea or vomiting Anesthetic complications: no     Last Vitals:  Vitals:   05/07/19 1116 05/07/19 1123  BP: (!) 160/75 (!) 169/74  Pulse: 63 62  Resp: 14 18  Temp: 36.4 C   SpO2: 99%     Last Pain:  Vitals:   05/07/19 1123  TempSrc: Oral  PainSc:                  Tianni Escamilla

## 2019-05-07 NOTE — Transfer of Care (Signed)
Immediate Anesthesia Transfer of Care Note  Patient: Wanda Ochoa. Wolf  Procedure(s) Performed: ECT TX  Patient Location: PACU  Anesthesia Type:General  Level of Consciousness: sedated  Airway & Oxygen Therapy: Patient Spontanous Breathing and Patient connected to face mask oxygen  Post-op Assessment: Report given to RN and Post -op Vital signs reviewed and stable  Post vital signs: Reviewed and stable  Last Vitals:  Vitals Value Taken Time  BP 177/86 05/07/19 1045  Temp    Pulse 70 05/07/19 1045  Resp 20 05/07/19 1045  SpO2 100 % 05/07/19 1045  Vitals shown include unvalidated device data.  Last Pain:  Vitals:   05/07/19 0840  TempSrc:   PainSc: 0-No pain         Complications: No apparent anesthesia complications

## 2019-05-08 DIAGNOSIS — F5105 Insomnia due to other mental disorder: Secondary | ICD-10-CM | POA: Diagnosis not present

## 2019-05-08 DIAGNOSIS — F411 Generalized anxiety disorder: Secondary | ICD-10-CM | POA: Diagnosis not present

## 2019-05-08 DIAGNOSIS — F331 Major depressive disorder, recurrent, moderate: Secondary | ICD-10-CM | POA: Diagnosis not present

## 2019-05-11 ENCOUNTER — Encounter: Payer: Self-pay | Admitting: Physical Therapy

## 2019-05-11 ENCOUNTER — Ambulatory Visit: Payer: Medicare Other | Admitting: Physical Therapy

## 2019-05-11 ENCOUNTER — Other Ambulatory Visit: Payer: Self-pay

## 2019-05-11 DIAGNOSIS — R2689 Other abnormalities of gait and mobility: Secondary | ICD-10-CM | POA: Diagnosis not present

## 2019-05-11 DIAGNOSIS — R262 Difficulty in walking, not elsewhere classified: Secondary | ICD-10-CM | POA: Diagnosis not present

## 2019-05-11 DIAGNOSIS — M6281 Muscle weakness (generalized): Secondary | ICD-10-CM

## 2019-05-11 NOTE — Therapy (Signed)
[place] MAIN [facility] SERVICES [address] [place], [place], [address] Phone: [phone]   Fax:  [phone]  Physical Therapy Treatment  Patient Details  Name: Wanda Ochoa. Weberg MRN: [id number] Date of Birth: [birth year] Referring Provider (PT): Lavon Paganini   Encounter Date: 05/11/2019  PT End of Session - 05/11/19 1307    Visit Number  2    Number of Visits  17    Date for PT Re-Evaluation  06/29/19    PT Start Time  0102    PT Stop Time  0140    PT Time Calculation (min)  38 min    Equipment Utilized During Treatment  Gait belt    Activity Tolerance  Patient tolerated treatment well       Past Medical History:  Diagnosis Date  . Anxiety   . Arthritis    right knee  . Asthma   . HTN (hypertension)   . Thyroid disease     Past Surgical History:  Procedure Laterality Date  . ABDOMINAL HYSTERECTOMY  1990  . BLADDER SUSPENSION  1990  . BREAST CYST ASPIRATION Right 1985  . BREAST EXCISIONAL BIOPSY Right 1967   EXCISIONAL BX - NEG  . BREAST SURGERY Right 1967   Lumpectomy  . CESAREAN SECTION    . COLONOSCOPY WITH PROPOFOL N/A 12/18/2018   Procedure: COLONOSCOPY WITH PROPOFOL;  Surgeon: Lollie Sails, MD;  Location: [facility] ENDOSCOPY;  Service: Endoscopy;  Laterality: N/A;  . HEMORRHOIDECTOMY WITH HEMORRHOID BANDING  1988    There were no vitals filed for this visit.  Subjective Assessment - 05/11/19 1306    Subjective  Patient reports that she has been falling at home.,No new complaints , no pain.    Pertinent History  Patient is having some falls and had 4 falls in the last month. She was indoors and she did not get hurt. She does not use an AD.    Limitations  Walking    How long can you sit comfortably?  unlimited    How long can you stand comfortably?  unlimited    Patient Stated Goals  to not fall    Currently in Pain?  No/denies    Multiple Pain Sites  No          Neuromuscular Re-education    Matrix 22. 5 lbs x 3 reps fwd/bwd, side to side with cGA   Tandem stand  without UE support x 2 min Heel/toe raises without UE support 3s hold x 10 each 1/2 foam roll balance with flat side up 30s x 2 reps 1/2 foam roll balance with flat side down 30s x 2 reps Lateral side steps from foam to 6 inch stool left and right x 15  [place] fwd/bwd, side to side , diagonal x 10 ,cues for posture and stepping strategies, occasional LOB Star stepping left and right x 10       Pt educated throughout session about proper posture and technique with exercises. Improved exercise technique, movement at target joints, use of target muscles after min to mod verbal, visual, tactile cues. CGA and Min to mod verbal cues used throughout with increased in postural sway and LOB most seen with narrow base of support and while on uneven surfaces. Continues to have balance deficits typical with diagnosis. Patient performs intermediate level exercises without pain behaviors and needs verbal cuing for postural alignment and head positioning Tactile cues and assistance needed to keep lower leg and knee in  neutral to avoid compensations with ankle motions.                      PT Education - 05/11/19 1306    Education Details  HEP    Person(s) Educated  Patient    Methods  Explanation    Comprehension  Verbalized understanding       PT Short Term Goals - 05/04/19 1427      PT SHORT TERM GOAL #1   Title  Patient will be independent in home exercise program to improve strength/mobility for better functional independence with ADLs.    Period  Weeks    Status  New    Target Date  06/01/19        PT Long Term Goals - 05/04/19 1428      PT LONG TERM GOAL #1   Title  Patient (> 59 years old) will complete five times sit to stand test in < 15 seconds indicating an increased LE strength and improved balance.    Time  8    Period  Weeks    Status  New    Target Date  06/29/19      PT  LONG TERM GOAL #2   Title  Patient will increase Berg Balance score by > 6 points to demonstrate decreased fall risk during functional activities.    Time  8    Period  Weeks    Status  New    Target Date  06/29/19      PT LONG TERM GOAL #3   Title  Patient will reduce timed up and go to <11 seconds to reduce fall risk and demonstrate improved transfer/gait ability.    Time  8    Period  Weeks    Status  New    Target Date  06/29/19            Plan - 05/11/19 1307    Clinical Impression Statement  Patient instructed in beginning balance and coordination exercise. Patient required mod VCs and min A for gait to improve weight shift and postural control. Patient requires min VCs to improve ankle stability with gait.  Patients would benefit from additional skilled PT intervention to improve balance/gait safety and reduce fall risk.   Personal Factors and Comorbidities  Age    Stability/Clinical Decision Making  Stable/Uncomplicated    Rehab Potential  Poor    PT Frequency  2x / week    PT Duration  8 weeks    PT Treatment/Interventions  Patient/family education;Balance training;Neuromuscular re-education;Manual techniques;Gait training;Therapeutic activities;Therapeutic exercise    PT Next Visit Plan  balance and strengthening    Consulted and Agree with Plan of Care  Patient       Patient will benefit from skilled therapeutic intervention in order to improve the following deficits and impairments:  Abnormal gait, Difficulty walking, Decreased strength, Impaired sensation  Visit Diagnosis: Difficulty in walking, not elsewhere classified  Other abnormalities of gait and mobility  Muscle weakness (generalized)     Problem List Patient Active Problem List   Diagnosis Date Noted  . Idiopathic gout involving toe of right foot 12/07/2018  . Effusion of right knee 06/18/2017  . Hematuria 09/11/2016  . Severe recurrent major depression without psychotic features ([place])   .  Hypercholesteremia 05/02/2015  . Mitral valve prolapse 02/21/2015  . Allergic rhinitis 12/24/2014  . Osteopenia 12/24/2014  . Hypothyroidism 11/24/2014  . Generalized anxiety disorder 02/24/2012  . Nocturnal hypoxemia 01/20/2012  .  Abdominal mass 01/20/2012  . Temporary cerebral vascular dysfunction 01/02/2009  . Benign hypertension 03/28/2008  . Asthma, exogenous 10/22/2007  . Adaptive colitis 03/12/2003    [name], PT DPT 05/11/2019, 1:08 PM  [place] MAIN [facility] SERVICES [address] [place], [place], [address] Phone: [phone]   Fax:  [phone]  Name: Abbrielle Rentas. Slyter MRN: [id number] Date of Birth: 05-02-1945

## 2019-05-13 ENCOUNTER — Ambulatory Visit: Payer: Medicare Other | Admitting: Physical Therapy

## 2019-05-17 ENCOUNTER — Encounter: Payer: Self-pay | Admitting: Physical Therapy

## 2019-05-17 ENCOUNTER — Ambulatory Visit: Payer: Medicare Other | Admitting: Physical Therapy

## 2019-05-17 ENCOUNTER — Other Ambulatory Visit: Payer: Self-pay

## 2019-05-17 DIAGNOSIS — R2689 Other abnormalities of gait and mobility: Secondary | ICD-10-CM

## 2019-05-17 DIAGNOSIS — M6281 Muscle weakness (generalized): Secondary | ICD-10-CM

## 2019-05-17 DIAGNOSIS — R262 Difficulty in walking, not elsewhere classified: Secondary | ICD-10-CM | POA: Diagnosis not present

## 2019-05-17 NOTE — Therapy (Signed)
[place] MAIN [facility] SERVICES [address] [place], [place], [address] Phone: [phone]   Fax:  [phone]  Physical Therapy Treatment  Patient Details  Name: Wanda Ochoa. Clock MRN: [id number] Date of Birth: October 26, 1945 Referring Provider (PT): Lavon Paganini   Encounter Date: 05/17/2019  PT End of Session - 05/17/19 1722    Visit Number  3    Number of Visits  17    Date for PT Re-Evaluation  06/29/19    PT Start Time  0400    PT Stop Time  0445    PT Time Calculation (min)  45 min    Equipment Utilized During Treatment  Gait belt    Activity Tolerance  Patient tolerated treatment well    Behavior During Therapy  WFL for tasks assessed/performed       Past Medical History:  Diagnosis Date  . Anxiety   . Arthritis    right knee  . Asthma   . HTN (hypertension)   . Thyroid disease     Past Surgical History:  Procedure Laterality Date  . ABDOMINAL HYSTERECTOMY  1990  . BLADDER SUSPENSION  1990  . BREAST CYST ASPIRATION Right 1985  . BREAST EXCISIONAL BIOPSY Right 1967   EXCISIONAL BX - NEG  . BREAST SURGERY Right 1967   Lumpectomy  . CESAREAN SECTION    . COLONOSCOPY WITH PROPOFOL N/A 12/18/2018   Procedure: COLONOSCOPY WITH PROPOFOL;  Surgeon: Lollie Sails, MD;  Location: [facility] ENDOSCOPY;  Service: Endoscopy;  Laterality: N/A;  . HEMORRHOIDECTOMY WITH HEMORRHOID BANDING  1988    There were no vitals filed for this visit.  Subjective Assessment - 05/17/19 1720    Subjective  Pt denies any falls since last PT visit.  She has been doing exercises at home but asks about the correct frequency for home exercises.    Pertinent History  Patient is having some falls and had 4 falls in the last month. She was indoors and she did not get hurt. She does not use an AD.    Limitations  Walking    How long can you sit comfortably?  unlimited    How long can you stand comfortably?  unlimited    Patient Stated Goals   to not fall    Currently in Pain?  No/denies    Multiple Pain Sites  No        Treatment:  NMR:  Marching on airex foam pad in // bars with light UE support; tandem stance in // bars without UE support; SLS in // bars without UE support; narrow BOS on blue airex pad with EO and EC, and with head turns to R and L; toe tapping on 6" step without UE support in // bars  Therapeutic Exercises:  Nustep L4 x 7 min; standing B hip / and hip abd 2x10 each; standing B heel/toe raises 20x; sit to stands from edge of plinth with hip add ball squeeze 2x10.                       PT Education - 05/17/19 1721    Education Details  tandem stance and SLS at [employer]) Educated  Patient    Methods  Explanation    Comprehension  Verbalized understanding;Returned demonstration       PT Short Term Goals - 05/04/19 1427      PT SHORT TERM GOAL #1   Title  Patient will be independent in home exercise program to improve strength/mobility for better functional independence with ADLs.    Period  Weeks    Status  New    Target Date  06/01/19        PT Long Term Goals - 05/04/19 1428      PT LONG TERM GOAL #1   Title  Patient (> 74 years old) will complete five times sit to stand test in < 15 seconds indicating an increased LE strength and improved balance.    Time  8    Period  Weeks    Status  New    Target Date  06/29/19      PT LONG TERM GOAL #2   Title  Patient will increase Berg Balance score by > 6 points to demonstrate decreased fall risk during functional activities.    Time  8    Period  Weeks    Status  New    Target Date  06/29/19      PT LONG TERM GOAL #3   Title  Patient will reduce timed up and go to <11 seconds to reduce fall risk and demonstrate improved transfer/gait ability.    Time  8    Period  Weeks    Status  New    Target Date  06/29/19            Plan - 05/17/19 1722    Clinical Impression Statement  Pt did not have any  LOB with resisted ambulation walk outs.  She did report that marching on foam airex was too difficult to perform without UE support in // bars today.  She demonstrated some muscle fatigue with sit to stands from plinth today, but was able to perform 2 sets of 10 reps.    Personal Factors and Comorbidities  Age    Stability/Clinical Decision Making  Stable/Uncomplicated    Clinical Decision Making  Low    Rehab Potential  Poor    PT Frequency  2x / week    PT Duration  8 weeks    PT Treatment/Interventions  Patient/family education;Balance training;Neuromuscular re-education;Manual techniques;Gait training;Therapeutic activities;Therapeutic exercise    PT Next Visit Plan  balance and strengthening    Consulted and Agree with Plan of Care  Patient       Patient will benefit from skilled therapeutic intervention in order to improve the following deficits and impairments:  Abnormal gait, Difficulty walking, Decreased strength, Impaired sensation  Visit Diagnosis: Difficulty in walking, not elsewhere classified  Other abnormalities of gait and mobility  Muscle weakness (generalized)     Problem List Patient Active Problem List   Diagnosis Date Noted  . Idiopathic gout involving toe of right foot 12/07/2018  . Effusion of right knee 06/18/2017  . Hematuria 09/11/2016  . Severe recurrent major depression without psychotic features ([place])   . Hypercholesteremia 05/02/2015  . Mitral valve prolapse 02/21/2015  . Allergic rhinitis 12/24/2014  . Osteopenia 12/24/2014  . Hypothyroidism 11/24/2014  . Generalized anxiety disorder 02/24/2012  . Nocturnal hypoxemia 01/20/2012  . Abdominal mass 01/20/2012  . Temporary cerebral vascular dysfunction 01/02/2009  . Benign hypertension 03/28/2008  . Asthma, exogenous 10/22/2007  . Adaptive colitis 03/12/2003    Darean Rote, MPT 05/17/2019, 5:25 PM  [place] MAIN [facility] SERVICES [address]  [place], [place], [address] Phone: [phone]   Fax:  [phone]  Name: Hawo Pokorny. [place]ick MRN: [id number] Date of Birth: [birth year]

## 2019-05-25 ENCOUNTER — Other Ambulatory Visit: Payer: Self-pay

## 2019-05-25 ENCOUNTER — Ambulatory Visit: Payer: Medicare Other | Attending: Family Medicine

## 2019-05-25 ENCOUNTER — Other Ambulatory Visit: Payer: Self-pay | Admitting: Family Medicine

## 2019-05-25 DIAGNOSIS — M6281 Muscle weakness (generalized): Secondary | ICD-10-CM | POA: Diagnosis not present

## 2019-05-25 DIAGNOSIS — R2689 Other abnormalities of gait and mobility: Secondary | ICD-10-CM | POA: Diagnosis not present

## 2019-05-25 DIAGNOSIS — I10 Essential (primary) hypertension: Secondary | ICD-10-CM

## 2019-05-25 DIAGNOSIS — R262 Difficulty in walking, not elsewhere classified: Secondary | ICD-10-CM | POA: Diagnosis not present

## 2019-05-25 NOTE — Telephone Encounter (Signed)
[place] faxed refill request for the following medications:  metoprolol tartrate (LOPRESSOR) 50 MG tablet    Please advise.  Thanks, [employer]

## 2019-05-25 NOTE — Therapy (Signed)
[place] MAIN [facility] SERVICES [address] [place], [place], [address] Phone: [phone]   Fax:  [phone]  Physical Therapy Treatment  Patient Details  Name: Wanda Ochoa MRN: [id number] Date of Birth: Sep 16, 1945 Referring Provider (PT): Lavon Paganini   Encounter Date: 05/25/2019  PT End of Session - 05/25/19 1227    Visit Number  4    Number of Visits  17    Date for PT Re-Evaluation  06/29/19    PT Start Time  [id number]    PT Stop Time  1203    PT Time Calculation (min)  40 min    Equipment Utilized During Treatment  Gait belt    Activity Tolerance  Patient tolerated treatment well    Behavior During Therapy  WFL for tasks assessed/performed       Past Medical History:  Diagnosis Date  . Anxiety   . Arthritis    right knee  . Asthma   . HTN (hypertension)   . Thyroid disease     Past Surgical History:  Procedure Laterality Date  . ABDOMINAL HYSTERECTOMY  1990  . BLADDER SUSPENSION  1990  . BREAST CYST ASPIRATION Right 1985  . BREAST EXCISIONAL BIOPSY Right 1967   EXCISIONAL BX - NEG  . BREAST SURGERY Right 1967   Lumpectomy  . CESAREAN SECTION    . COLONOSCOPY WITH PROPOFOL N/A 12/18/2018   Procedure: COLONOSCOPY WITH PROPOFOL;  Surgeon: Lollie Sails, MD;  Location: [facility] ENDOSCOPY;  Service: Endoscopy;  Laterality: N/A;  . HEMORRHOIDECTOMY WITH HEMORRHOID BANDING  1988    There were no vitals filed for this visit.  Subjective Assessment - 05/25/19 1226    Subjective  Patient presents to physical therapy, reports no falls or LOB since last session. Has been going to the [place] between sessions.    Pertinent History  Patient is having some falls and had 4 falls in the last month. She was indoors and she did not get hurt. She does not use an AD.    Limitations  Walking    How long can you sit comfortably?  unlimited    How long can you stand comfortably?  unlimited    Patient Stated Goals  to not  fall    Currently in Pain?  No/denies         Neuromuscular Re-education    airex pad: 6: step tandem stance; hold 30 seconds one foot on each surface x2 trials each LE   airex pad 6" step toe taps no UE support, cueing for slow controlled single limb stance. 12x each LE  airex pad 6" step lateral toe taps no UE support, cueing for slow controlled movements for stabilizing on single limb, 12x each LE   airex pad: df/pf 15x each side   Modified single limb stance with soccer ball under foot; hold 60 seconds each LE  Eccentric heel tap : focus on heel strike very challenging with RLE stabilzing LE   Lateral hip step up 12x each side; UE support 6" step  5 cone lateral step overs x 4 trials, very challenging for foot clearance with frequent episodes of circumduction   Forward step over and back orange hurdle 12x each LE  TherEx Seated:   RTB 4 way ankle; df/pf/ev/in 15x each direction against PT resistance Rainbow ball adduction with LAQ 12x Sit to stand 15x with soccer ball overhead press.  Dynadisc: df/pf 15x, ev/inv 15x, clockwise circle 15x, counterclockwise 15x each LE  Pt educated throughout session about proper posture and technique with exercises. Improved exercise technique, movement at target joints, use of target muscles after min to mod verbal, visual, tactile cues.    Patient presents to physical therapy with excellent motivation. She demonstrates limited stability with unstable surfaces and single limb stance. Ankle instability focused on with interventions and coordination. Pt will benefit from skilled PT services to address deficits in balance and strength in order to improve function and decrease fall risk.                PT Education - 05/25/19 1226    Education Details  exercise technique, body mechanics    Person(s) Educated  Patient    Methods  Explanation;Demonstration;Tactile cues;Verbal cues    Comprehension  Verbalized  understanding;Returned demonstration;Verbal cues required;Tactile cues required       PT Short Term Goals - 05/04/19 1427      PT SHORT TERM GOAL #1   Title  Patient will be independent in home exercise program to improve strength/mobility for better functional independence with ADLs.    Period  Weeks    Status  New    Target Date  06/01/19        PT Long Term Goals - 05/04/19 1428      PT LONG TERM GOAL #1   Title  Patient (> 68 years old) will complete five times sit to stand test in < 15 seconds indicating an increased LE strength and improved balance.    Time  8    Period  Weeks    Status  New    Target Date  06/29/19      PT LONG TERM GOAL #2   Title  Patient will increase Berg Balance score by > 6 points to demonstrate decreased fall risk during functional activities.    Time  8    Period  Weeks    Status  New    Target Date  06/29/19      PT LONG TERM GOAL #3   Title  Patient will reduce timed up and go to <11 seconds to reduce fall risk and demonstrate improved transfer/gait ability.    Time  8    Period  Weeks    Status  New    Target Date  06/29/19            Plan - 05/25/19 1229    Clinical Impression Statement  Patient presents to physical therapy with excellent motivation. She demonstrates limited stability with unstable surfaces and single limb stance. Ankle instability focused on with interventions and coordination. Pt will benefit from skilled PT services to address deficits in balance and strength in order to improve function and decrease fall risk.    Personal Factors and Comorbidities  Age    Stability/Clinical Decision Making  Stable/Uncomplicated    Rehab Potential  Poor    PT Frequency  2x / week    PT Duration  8 weeks    PT Treatment/Interventions  Patient/family education;Balance training;Neuromuscular re-education;Manual techniques;Gait training;Therapeutic activities;Therapeutic exercise    PT Next Visit Plan  balance and strengthening     Consulted and Agree with Plan of Care  Patient       Patient will benefit from skilled therapeutic intervention in order to improve the following deficits and impairments:  Abnormal gait, Difficulty walking, Decreased strength, Impaired sensation  Visit Diagnosis: Difficulty in walking, not elsewhere classified  Other abnormalities of gait and mobility  Muscle weakness (generalized)  Problem List Patient Active Problem List   Diagnosis Date Noted  . Idiopathic gout involving toe of right foot 12/07/2018  . Effusion of right knee 06/18/2017  . Hematuria 09/11/2016  . Severe recurrent major depression without psychotic features ([place])   . Hypercholesteremia 05/02/2015  . Mitral valve prolapse 02/21/2015  . Allergic rhinitis 12/24/2014  . Osteopenia 12/24/2014  . Hypothyroidism 11/24/2014  . Generalized anxiety disorder 02/24/2012  . Nocturnal hypoxemia 01/20/2012  . Abdominal mass 01/20/2012  . Temporary cerebral vascular dysfunction 01/02/2009  . Benign hypertension 03/28/2008  . Asthma, exogenous 10/22/2007  . Adaptive colitis 03/12/2003   Janna Arch, PT, DPT   05/25/2019, 12:30 PM  [place] MAIN [facility] SERVICES [address] [place], [place], [address] Phone: [phone]   Fax:  [phone]  Name: Wanda Ochoa MRN: [id number] Date of Birth: 02/03/1946

## 2019-05-27 ENCOUNTER — Ambulatory Visit: Payer: Medicare Other

## 2019-05-27 ENCOUNTER — Other Ambulatory Visit: Payer: Self-pay

## 2019-05-27 DIAGNOSIS — M6281 Muscle weakness (generalized): Secondary | ICD-10-CM | POA: Diagnosis not present

## 2019-05-27 DIAGNOSIS — R262 Difficulty in walking, not elsewhere classified: Secondary | ICD-10-CM

## 2019-05-27 DIAGNOSIS — R2689 Other abnormalities of gait and mobility: Secondary | ICD-10-CM | POA: Diagnosis not present

## 2019-05-27 NOTE — Therapy (Signed)
[place] MAIN [facility] SERVICES [address] [place], [place], [address] Phone: [phone]   Fax:  [phone]  Physical Therapy Treatment  Patient Details  Name: Wanda Ochoa. [place] MRN: [id number] Date of Birth: 09/04/1945 Referring Provider (PT): Lavon Paganini   Encounter Date: 05/27/2019  PT End of Session - 05/27/19 1121    Visit Number  5    Number of Visits  17    Date for PT Re-Evaluation  06/29/19    PT Start Time  1116    PT Stop Time  1200    PT Time Calculation (min)  44 min    Equipment Utilized During Treatment  Gait belt    Activity Tolerance  Patient tolerated treatment well    Behavior During Therapy  WFL for tasks assessed/performed       Past Medical History:  Diagnosis Date  . Anxiety   . Arthritis    right knee  . Asthma   . HTN (hypertension)   . Thyroid disease     Past Surgical History:  Procedure Laterality Date  . ABDOMINAL HYSTERECTOMY  1990  . BLADDER SUSPENSION  1990  . BREAST CYST ASPIRATION Right 1985  . BREAST EXCISIONAL BIOPSY Right 1967   EXCISIONAL BX - NEG  . BREAST SURGERY Right 1967   Lumpectomy  . CESAREAN SECTION    . COLONOSCOPY WITH PROPOFOL N/A 12/18/2018   Procedure: COLONOSCOPY WITH PROPOFOL;  Surgeon: Lollie Sails, MD;  Location: [facility] ENDOSCOPY;  Service: Endoscopy;  Laterality: N/A;  . HEMORRHOIDECTOMY WITH HEMORRHOID BANDING  1988    There were no vitals filed for this visit.  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 Reports no falls or LOB since last session.    Pertinent History  Patient is having some falls and had 4 falls in the last month. She was indoors and she did not get hurt. She does not use an AD.    Limitations  Walking    How long can you sit comfortably?  unlimited    How long can you stand comfortably?  unlimited    Patient Stated Goals  to not fall    Currently in Pain?  No/denies      Octane fitness Lvl 6 4 minutes for cardiovascular challenge.     Neuromuscular Re-education    Obstacle course : step over two hurdles two half foam rollers and around 3 cones airex pad: 6: step tandem stance; hold 30 seconds one foot on each surface x2 trials each LE    airex pad with dyna disc modified tandem stance 30 second holds each foot position x2 trials each LE  airex pad under feet sit to stand with UE support 15x   Resisted walking 12.5 3x each direction forward, backwards, lateral L and R ; close CGA , two near LOB    Half foam roller: df/pf rocking flat side up 15x   Agility ladder:  -forward stepping one foot each square 2x each length , for increased step length and stabilization of single limb -lateral stepping two feet per square 2x each length -forward in in out out diagonal for coordination spatial awareness and stability 2x length -lateral two feet in two feet out 2x length, very challenging one near LOB   TherEx Seated:    RTB around toes: df with marching 10x each LE  RTB around ankles: LAQ with  upright posture 10x each LE; added to HEP   GTB df: 15x each LE added to HEP       Pt educated throughout session about proper posture and technique with exercises. Improved exercise technique, movement at target joints, use of target muscles after min to mod verbal, visual, tactile cues                      PT Education - 05/27/19 1120    Education Details  exercise technique, body mechanics    Person(s) Educated  Patient    Methods  Explanation;Demonstration;Tactile cues;Verbal cues    Comprehension  Verbalized understanding;Returned demonstration;Verbal cues required;Tactile cues required       PT Short Term Goals - 05/04/19 1427      PT SHORT TERM GOAL #1   Title  Patient will be independent in home exercise program to improve strength/mobility for better functional independence with ADLs.     Period  Weeks    Status  New    Target Date  06/01/19        PT Long Term Goals - 05/04/19 1428      PT LONG TERM GOAL #1   Title  Patient (> 81 years old) will complete five times sit to stand test in < 15 seconds indicating an increased LE strength and improved balance.    Time  8    Period  Weeks    Status  New    Target Date  06/29/19      PT LONG TERM GOAL #2   Title  Patient will increase Berg Balance score by > 6 points to demonstrate decreased fall risk during functional activities.    Time  8    Period  Weeks    Status  New    Target Date  06/29/19      PT LONG TERM GOAL #3   Title  Patient will reduce timed up and go to <11 seconds to reduce fall risk and demonstrate improved transfer/gait ability.    Time  8    Period  Weeks    Status  New    Target Date  06/29/19            Plan - 05/27/19 1251    Clinical Impression Statement  Patient continues to progress with single limb stability on stable and unstable surfaces. Tandem stance with dynadisc was progressed today with challenge to patient at this time. Dynamic stability with coordination is challenging for patient when tempo is increased indicating additional area of progression. Pt will benefit from skilled PT services to address deficits in balance and strength in order to improve function and decrease fall risk.    Personal Factors and Comorbidities  Age    Stability/Clinical Decision Making  Stable/Uncomplicated    Rehab Potential  Poor    PT Frequency  2x / week    PT Duration  8 weeks    PT Treatment/Interventions  Patient/family education;Balance training;Neuromuscular re-education;Manual techniques;Gait training;Therapeutic activities;Therapeutic exercise    PT Next Visit Plan  balance and strengthening    Consulted and Agree with Plan of Care  Patient       Patient will benefit from skilled therapeutic intervention in order to improve the following deficits and impairments:  Abnormal gait,  Difficulty walking, Decreased strength, Impaired sensation  Visit Diagnosis: Difficulty in walking, not elsewhere classified  Other abnormalities of gait and mobility  Muscle weakness (generalized)     Problem List Patient  Active Problem List   Diagnosis Date Noted  . Idiopathic gout involving toe of right foot 12/07/2018  . Effusion of right knee 06/18/2017  . Hematuria 09/11/2016  . Severe recurrent major depression without psychotic features ([place])   . Hypercholesteremia 05/02/2015  . Mitral valve prolapse 02/21/2015  . Allergic rhinitis 12/24/2014  . Osteopenia 12/24/2014  . Hypothyroidism 11/24/2014  . Generalized anxiety disorder 02/24/2012  . Nocturnal hypoxemia 01/20/2012  . Abdominal mass 01/20/2012  . Temporary cerebral vascular dysfunction 01/02/2009  . Benign hypertension 03/28/2008  . Asthma, exogenous 10/22/2007  . Adaptive colitis 03/12/2003   Janna Arch, PT, DPT   05/27/2019, 12:52 PM  [place] MAIN [facility] SERVICES [address] [place], [place], [address] Phone: [phone]   Fax:  [phone]  Name: Kahlia Gruenberg. [place] MRN: [id number] Date of Birth: Apr 22, 1945

## 2019-06-01 MED ORDER — METOPROLOL TARTRATE 50 MG PO TABS: 50.0000 mg | ORAL_TABLET | Freq: Two times a day (BID) | ORAL | 2 refills | Status: DC

## 2019-06-01 NOTE — Telephone Encounter (Signed)
We received another refill request for this.  Please advise

## 2019-06-03 ENCOUNTER — Encounter: Payer: Self-pay | Admitting: Physical Therapy

## 2019-06-03 ENCOUNTER — Ambulatory Visit: Payer: Medicare Other | Admitting: Physical Therapy

## 2019-06-03 ENCOUNTER — Other Ambulatory Visit: Payer: Self-pay

## 2019-06-03 DIAGNOSIS — F411 Generalized anxiety disorder: Secondary | ICD-10-CM | POA: Diagnosis not present

## 2019-06-03 DIAGNOSIS — R262 Difficulty in walking, not elsewhere classified: Secondary | ICD-10-CM | POA: Diagnosis not present

## 2019-06-03 DIAGNOSIS — R2689 Other abnormalities of gait and mobility: Secondary | ICD-10-CM | POA: Diagnosis not present

## 2019-06-03 DIAGNOSIS — F331 Major depressive disorder, recurrent, moderate: Secondary | ICD-10-CM | POA: Diagnosis not present

## 2019-06-03 DIAGNOSIS — F5105 Insomnia due to other mental disorder: Secondary | ICD-10-CM | POA: Diagnosis not present

## 2019-06-03 DIAGNOSIS — M6281 Muscle weakness (generalized): Secondary | ICD-10-CM

## 2019-06-03 NOTE — Therapy (Signed)
[place] MAIN [facility] SERVICES [address] [place], [place], [address] Phone: [phone]   Fax:  [phone]  Physical Therapy Treatment  Patient Details  Name: Wanda Ochoa MRN: [id number] Date of Birth: 1945/03/30 Referring Provider (PT): Lavon Paganini   Encounter Date: 06/03/2019  PT End of Session - 06/03/19 1502    Visit Number  6    Number of Visits  17    Date for PT Re-Evaluation  06/29/19    PT Start Time  1450    PT Stop Time  1530    PT Time Calculation (min)  40 min    Equipment Utilized During Treatment  Gait belt    Activity Tolerance  Patient tolerated treatment well    Behavior During Therapy  WFL for tasks assessed/performed       Past Medical History:  Diagnosis Date  . Anxiety   . Arthritis    right knee  . Asthma   . HTN (hypertension)   . Thyroid disease     Past Surgical History:  Procedure Laterality Date  . ABDOMINAL HYSTERECTOMY  1990  . BLADDER SUSPENSION  1990  . BREAST CYST ASPIRATION Right 1985  . BREAST EXCISIONAL BIOPSY Right 1967   EXCISIONAL BX - NEG  . BREAST SURGERY Right 1967   Lumpectomy  . CESAREAN SECTION    . COLONOSCOPY WITH PROPOFOL N/A 12/18/2018   Procedure: COLONOSCOPY WITH PROPOFOL;  Surgeon: Lollie Sails, MD;  Location: [facility] ENDOSCOPY;  Service: Endoscopy;  Laterality: N/A;  . HEMORRHOIDECTOMY WITH HEMORRHOID BANDING  1988    There were no vitals filed for this visit.  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  Reports no falls or LOB since last session.    Pertinent History  Patient is having some falls and had 4 falls in the last month. She was indoors and she did not get hurt. She does not use an AD.    Limitations  Walking    How long can you sit comfortably?  unlimited    How long can you stand comfortably?  unlimited    Patient Stated Goals  to not fall    Currently in  Pain?  No/denies    Multiple Pain Sites  No       Neuromuscular Re-education  Standing on foam and tapping 6 inch stool x 20  Rocker board fwd/bwd, side to side x 20 each direction Tandem stand on 1/2 foam  without UE support x 2 lengths Side stepping on foam balance without UE support x 5 lengths Heel/toe raises without UE support 3s hold x 10 each 1/2 foam roll balance with flat side up 30s x 2 reps 1/2 foam roll balance with flat side down 30s x 2 reps 1/2 foam roll tandem balance alternating forward LE 30s x 2 each LE forward Lateral side steps from foam to 6 inch stool left and right x 15 Backwards stepping from foam to 6 inch stool x 15  Fwd / bwd step over hurdle x 10  Side to side stepping over hurdle x 10       Pt educated throughout session about proper posture and technique with exercises. Improved exercise technique, movement at target joints, use of target muscles after min to mod verbal, visual, tactile cues. CGA and Min to mod verbal cues used throughout with increased in postural sway and LOB  most seen with narrow base of support and while on uneven surfaces. Continues to have balance deficits typical with diagnosis.                      PT Education - 06/03/19 1501    Education Details  HEP    Person(s) Educated  Patient    Methods  Explanation    Comprehension  Verbalized understanding       PT Short Term Goals - 05/04/19 1427      PT SHORT TERM GOAL #1   Title  Patient will be independent in home exercise program to improve strength/mobility for better functional independence with ADLs.    Period  Weeks    Status  New    Target Date  06/01/19        PT Long Term Goals - 05/04/19 1428      PT LONG TERM GOAL #1   Title  Patient (> 68 years old) will complete five times sit to stand test in < 15 seconds indicating an increased LE strength and improved balance.    Time  8    Period  Weeks    Status  New    Target Date  06/29/19       PT LONG TERM GOAL #2   Title  Patient will increase Berg Balance score by > 6 points to demonstrate decreased fall risk during functional activities.    Time  8    Period  Weeks    Status  New    Target Date  06/29/19      PT LONG TERM GOAL #3   Title  Patient will reduce timed up and go to <11 seconds to reduce fall risk and demonstrate improved transfer/gait ability.    Time  8    Period  Weeks    Status  New    Target Date  06/29/19            Plan - 06/03/19 1503    Clinical Impression Statement  Patient instructed in advanced LE strengthening and intermediate balance exercise. Patient required min VCS to improve weight shift through her feet and ankles . Patient would benefit from additional skilled PT intervention to improve strength, balance and gait safety.   Personal Factors and Comorbidities  Age    Stability/Clinical Decision Making  Stable/Uncomplicated    Rehab Potential  Poor    PT Frequency  2x / week    PT Duration  8 weeks    PT Treatment/Interventions  Patient/family education;Balance training;Neuromuscular re-education;Manual techniques;Gait training;Therapeutic activities;Therapeutic exercise    PT Next Visit Plan  balance and strengthening    Consulted and Agree with Plan of Care  Patient       Patient will benefit from skilled therapeutic intervention in order to improve the following deficits and impairments:  Abnormal gait, Difficulty walking, Decreased strength, Impaired sensation  Visit Diagnosis: Difficulty in walking, not elsewhere classified  Other abnormalities of gait and mobility  Muscle weakness (generalized)     Problem List Patient Active Problem List   Diagnosis Date Noted  . Idiopathic gout involving toe of right foot 12/07/2018  . Effusion of right knee 06/18/2017  . Hematuria 09/11/2016  . Severe recurrent major depression without psychotic features ([place])   . Hypercholesteremia 05/02/2015  . Mitral valve prolapse  02/21/2015  . Allergic rhinitis 12/24/2014  . Osteopenia 12/24/2014  . Hypothyroidism 11/24/2014  . Generalized anxiety disorder 02/24/2012  . Nocturnal hypoxemia  01/20/2012  . Abdominal mass 01/20/2012  . Temporary cerebral vascular dysfunction 01/02/2009  . Benign hypertension 03/28/2008  . Asthma, exogenous 10/22/2007  . Adaptive colitis 03/12/2003    [name], PT DPT 06/03/2019, 3:07 PM  [place] MAIN [facility] SERVICES [address] [place], [place], [address] Phone: [phone]   Fax:  [phone]  Name: Wanda Ochoa MRN: [id number] Date of Birth: 23-Nov-1945

## 2019-06-07 ENCOUNTER — Other Ambulatory Visit: Payer: Self-pay

## 2019-06-07 ENCOUNTER — Encounter: Payer: Self-pay | Admitting: Emergency Medicine

## 2019-06-07 ENCOUNTER — Emergency Department: Payer: Medicare Other

## 2019-06-07 ENCOUNTER — Ambulatory Visit: Payer: Medicare Other | Admitting: Physical Therapy

## 2019-06-07 ENCOUNTER — Emergency Department
Admission: EM | Admit: 2019-06-07 | Discharge: 2019-06-07 | Disposition: A | Discharge status: Home / Self Care | Payer: Medicare Other | Attending: Emergency Medicine | Admitting: Emergency Medicine

## 2019-06-07 DIAGNOSIS — M6281 Muscle weakness (generalized): Secondary | ICD-10-CM | POA: Diagnosis not present

## 2019-06-07 DIAGNOSIS — Y9389 Activity, other specified: Secondary | ICD-10-CM | POA: Diagnosis not present

## 2019-06-07 DIAGNOSIS — Y92481 Parking lot as the place of occurrence of the external cause: Secondary | ICD-10-CM | POA: Diagnosis not present

## 2019-06-07 DIAGNOSIS — I10 Essential (primary) hypertension: Secondary | ICD-10-CM | POA: Diagnosis not present

## 2019-06-07 DIAGNOSIS — Z79899 Other long term (current) drug therapy: Secondary | ICD-10-CM | POA: Insufficient documentation

## 2019-06-07 DIAGNOSIS — W010XXA Fall on same level from slipping, tripping and stumbling without subsequent striking against object, initial encounter: Secondary | ICD-10-CM | POA: Diagnosis not present

## 2019-06-07 DIAGNOSIS — Z7982 Long term (current) use of aspirin: Secondary | ICD-10-CM | POA: Diagnosis not present

## 2019-06-07 DIAGNOSIS — S0990XA Unspecified injury of head, initial encounter: Secondary | ICD-10-CM | POA: Diagnosis not present

## 2019-06-07 DIAGNOSIS — Y992 Volunteer activity: Secondary | ICD-10-CM | POA: Insufficient documentation

## 2019-06-07 DIAGNOSIS — S199XXA Unspecified injury of neck, initial encounter: Secondary | ICD-10-CM | POA: Diagnosis not present

## 2019-06-07 DIAGNOSIS — S0003XA Contusion of scalp, initial encounter: Secondary | ICD-10-CM | POA: Diagnosis not present

## 2019-06-07 DIAGNOSIS — R2689 Other abnormalities of gait and mobility: Secondary | ICD-10-CM

## 2019-06-07 DIAGNOSIS — R262 Difficulty in walking, not elsewhere classified: Secondary | ICD-10-CM | POA: Diagnosis not present

## 2019-06-07 DIAGNOSIS — R220 Localized swelling, mass and lump, head: Secondary | ICD-10-CM | POA: Diagnosis not present

## 2019-06-07 MED ORDER — ACETAMINOPHEN 325 MG PO TABS
650.0000 mg | ORAL_TABLET | Freq: Once | ORAL | Status: AC
  Administered 2019-06-07: 650 mg via ORAL
  Filled 2019-06-07: qty 2

## 2019-06-07 NOTE — ED Notes (Signed)
See triage note  Presents s/p fall  States she fell in parking lot    Small hematoma to back of head

## 2019-06-07 NOTE — ED Provider Notes (Signed)
[facility] Emergency Department Provider Note  ____________________________________________  Time seen: Approximately 11:42 AM  I have reviewed the triage vital signs and the nursing notes.   HISTORY  Chief Complaint Fall and Head Injury    HPI Wanda Ochoa is a 74 y.o. female that presents to the emergency department for evaluation of head injury.  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  While she was trying to catch the bag, she lost her footing and fell backwards.  She hit the back of her head.  She did not lose consciousness.  She is not on any blood thinners.  Her shoulders are sore but she had denies any additional injuries.  No headache, dizziness, visual changes.   Past Medical History:  Diagnosis Date  . Anxiety   . Arthritis    right knee  . Asthma   . HTN (hypertension)   . Thyroid disease     Patient Active Problem List   Diagnosis Date Noted  . Idiopathic gout involving toe of right foot 12/07/2018  . Effusion of right knee 06/18/2017  . Hematuria 09/11/2016  . Severe recurrent major depression without psychotic features ([place])   . Hypercholesteremia 05/02/2015  . Mitral valve prolapse 02/21/2015  . Allergic rhinitis 12/24/2014  . Osteopenia 12/24/2014  . Hypothyroidism 11/24/2014  . Generalized anxiety disorder 02/24/2012  . Nocturnal hypoxemia 01/20/2012  . Abdominal mass 01/20/2012  . Temporary cerebral vascular dysfunction 01/02/2009  . Benign hypertension 03/28/2008  . Asthma, exogenous 10/22/2007  . Adaptive colitis 03/12/2003    Past Surgical History:  Procedure Laterality Date  . ABDOMINAL HYSTERECTOMY  1990  . BLADDER SUSPENSION  1990  . BREAST CYST ASPIRATION Right 1985  . BREAST EXCISIONAL BIOPSY Right 1967   EXCISIONAL BX - NEG  . BREAST SURGERY Right 1967    Lumpectomy  . CESAREAN SECTION    . COLONOSCOPY WITH PROPOFOL N/A 12/18/2018   Procedure: COLONOSCOPY WITH PROPOFOL;  Surgeon: Lollie Sails, MD;  Location: [facility] ENDOSCOPY;  Service: Endoscopy;  Laterality: N/A;  . [place]    Prior to Admission medications   Medication Sig Start Date End Date Taking? Authorizing Provider  amLODipine (NORVASC) 5 MG tablet Take 5 mg by mouth 2 (two) times daily.     [provider]  aspirin 81 MG tablet Take 81 mg by mouth daily.    [provider]  hydrochlorothiazide (HYDRODIURIL) 12.5 MG tablet Take 1 tablet (12.5 mg total) by mouth daily. 12/07/18   Virginia Crews, MD  hydrOXYzine (ATARAX/VISTARIL) 25 MG tablet Take 25 mg by mouth 4 (four) times daily as needed.    [provider]  KRILL OIL PO Take 1 capsule by mouth daily. Reported on 06/02/2015    [provider]  levothyroxine (SYNTHROID) 100 MCG tablet Take 1 tablet (100 mcg total) by mouth daily. 02/10/19   Virginia Crews, MD  lisinopril (PRINIVIL,ZESTRIL) 20 MG tablet Take 20 mg by mouth 2 (two) times daily.     [provider]  lisinopril (ZESTRIL) 40 MG tablet Take 20 mg by mouth 2 (two) times daily.    [provider]  metoprolol tartrate (LOPRESSOR) 50 MG tablet Take 1 tablet (50 mg total) by mouth 2 (two) times daily. 06/01/19   Virginia Crews,  MD  Multiple Vitamin (MULTIVITAMIN) capsule Take 1 capsule by mouth daily.    [provider]  nortriptyline (PAMELOR) 25 MG capsule Take 4 capsule at bed time Patient taking differently: Take 3 capsule at bed time 04/08/12   Arfeen, Arlyce Harman, MD  Omega 3-6-9 CAPS Take by mouth daily.     [provider]  polyethylene glycol (MIRALAX / GLYCOLAX) 17 g packet Take 17 g by mouth daily. Mix in 4-8 oz of fluid    [provider]  QUEtiapine (SEROQUEL) 100 MG tablet Take 100 mg by mouth at bedtime.     [provider]   QUEtiapine (SEROQUEL) 25 MG tablet Take 50 mg by mouth at bedtime.  05/01/15   [provider]  VENTOLIN HFA 108 (90 Base) MCG/ACT inhaler INHALE 2 PUFFS INTO THE LUNGS EVERY 6 HOURS AS NEEDED 03/26/17   Birdie Sons, MD    Allergies Penicillins  Family History  Problem Relation Age of Onset  . Depression Father   . Suicidality Father   . Depression Sister   . Breast cancer Sister 84  . Autism Son   . Heart disease Mother   . Colon cancer Mother   . Hypertension Mother   . Anxiety disorder Mother   . Mental illness Daughter   . Allergies Brother   . Allergies Sister   . Asthma Maternal Uncle   . Heart disease Maternal Grandfather   . Heart disease Maternal Grandmother   . Breast cancer Paternal Grandmother   . Breast cancer Other     Social History Social History   Tobacco Use  . Smoking status: Never Smoker  . Smokeless tobacco: Never Used  Substance Use Topics  . Alcohol use: No  . Drug use: No     Review of Systems  Respiratory: No SOB. Gastrointestinal: No nausea, no vomiting.  Musculoskeletal: Positive for sore shoulder. Skin: Negative for rash, abrasions, lacerations, ecchymosis. Neurological: Negative for headaches, numbness or tingling   ____________________________________________   PHYSICAL EXAM:  VITAL SIGNS: ED Triage Vitals [06/07/19 1128]  Enc Vitals Group     BP (!) 150/67     Pulse Rate 67     Resp 16     Temp 98.1 F (36.7 C)     Temp Source Oral     SpO2 99 %     Weight 180 lb (81.6 kg)     Height 5\' 9"  (1.753 m)     Head Circumference      Peak Flow      Pain Score 0     Pain Loc      Pain Edu?      Excl. in [place]?      Constitutional: Alert and oriented. Well appearing and in no acute distress. Eyes: Conjunctivae are normal. PERRL. EOMI. Head: Hematoma to posterior skull. ENT:      Ears:      Nose: No congestion/rhinnorhea.      Mouth/Throat: Mucous membranes are moist.  Neck: No stridor. No cervical spine  tenderness to palpation. Full ROM of neck. Cardiovascular: Normal rate, regular rhythm.  Good peripheral circulation. Respiratory: Normal respiratory effort without tachypnea or retractions. Lungs CTAB. Good air entry to the bases with no decreased or absent breath sounds. Musculoskeletal: Full range of motion to all extremities. No gross deformities appreciated. Neurologic:  Normal speech and language. No gross focal neurologic deficits are appreciated.  Skin:  Skin is warm, dry and intact. No rash noted. Psychiatric: Mood  and affect are normal. Speech and behavior are normal. Patient exhibits appropriate insight and judgement.   ____________________________________________   LABS (all labs ordered are listed, but only abnormal results are displayed)  Labs Reviewed - No data to display ____________________________________________  EKG   ____________________________________________  RADIOLOGY Robinette Haines, personally viewed and evaluated these images (plain radiographs) as part of my medical decision making, as well as reviewing the written report by the radiologist.  CT Head Wo Contrast  Result Date: 06/07/2019 CLINICAL DATA:  Head trauma, headache. Additional history provided: Patient fell backwards in parking lot today, swelling to back of head, neck soreness. EXAM: CT HEAD WITHOUT CONTRAST CT CERVICAL SPINE WITHOUT CONTRAST TECHNIQUE: Multidetector CT imaging of the head and cervical spine was performed following the standard protocol without intravenous contrast. Multiplanar CT image reconstructions of the cervical spine were also generated. COMPARISON:  CT head/maxillofacial 12/23/2018, CT cervical spine 12/23/2018. FINDINGS: CT HEAD FINDINGS Brain: There is no evidence of acute intracranial hemorrhage, intracranial mass, midline shift or extra-axial fluid collection.No demarcated cortical infarction. Mild ill-defined hypoattenuation within the cerebral white matter is unchanged.  Findings are nonspecific, but consistent with chronic small vessel ischemic disease. Stable, mild generalized parenchymal atrophy. Vascular: No hyperdense vessel.  Atherosclerotic calcifications. Skull: Normal. Negative for fracture or focal lesion. Sinuses/Orbits: Visualized orbits demonstrate no acute abnormality. No significant paranasal sinus disease or mastoid effusion at the imaged levels. Other: Prominent parietooccipital scalp hematoma. CT CERVICAL SPINE FINDINGS Alignment: Straightening of the expected cervical lordosis. Trace C3-C4 grade 1 anterolisthesis. 2 mm C4-C5 grade 1 anterolisthesis. Skull base and vertebrae: The basion-dental and atlanto-dental intervals are maintained.No evidence of acute fracture to the cervical spine. Soft tissues and spinal canal: No prevertebral fluid or swelling. No visible canal hematoma. Disc levels: Cervical spondylosis greatest at C5-C6 where there is moderate to advanced disc height loss, posterior disc osteophyte complex as well as uncovertebral hypertrophy. Bilateral neural foraminal narrowing as well as mild bony canal stenosis at this level. Upper chest: No consolidation within the imaged lung apices. No visible pneumothorax. IMPRESSION: CT head: 1. No evidence of acute intracranial abnormality. Midline parietooccipital scalp hematoma. 2. Stable generalized parenchymal atrophy and chronic small vessel ischemic disease. CT cervical spine: 1. No evidence of acute fracture to the cervical spine. 2. Cervical spondylosis as described and greatest at C5-C6. Multilevel degenerative grade 1 anterolisthesis. Electronically Signed   By: Kellie Simmering DO   On: 06/07/2019 12:29   CT Cervical Spine Wo Contrast  Result Date: 06/07/2019 CLINICAL DATA:  Head trauma, headache. Additional history provided: Patient fell backwards in parking lot today, swelling to back of head, neck soreness. EXAM: CT HEAD WITHOUT CONTRAST CT CERVICAL SPINE WITHOUT CONTRAST TECHNIQUE: Multidetector  CT imaging of the head and cervical spine was performed following the standard protocol without intravenous contrast. Multiplanar CT image reconstructions of the cervical spine were also generated. COMPARISON:  CT head/maxillofacial 12/23/2018, CT cervical spine 12/23/2018. FINDINGS: CT HEAD FINDINGS Brain: There is no evidence of acute intracranial hemorrhage, intracranial mass, midline shift or extra-axial fluid collection.No demarcated cortical infarction. Mild ill-defined hypoattenuation within the cerebral white matter is unchanged. Findings are nonspecific, but consistent with chronic small vessel ischemic disease. Stable, mild generalized parenchymal atrophy. Vascular: No hyperdense vessel.  Atherosclerotic calcifications. Skull: Normal. Negative for fracture or focal lesion. Sinuses/Orbits: Visualized orbits demonstrate no acute abnormality. No significant paranasal sinus disease or mastoid effusion at the imaged levels. Other: Prominent parietooccipital scalp hematoma. CT CERVICAL SPINE FINDINGS Alignment: Straightening of the  expected cervical lordosis. Trace C3-C4 grade 1 anterolisthesis. 2 mm C4-C5 grade 1 anterolisthesis. Skull base and vertebrae: The basion-dental and atlanto-dental intervals are maintained.No evidence of acute fracture to the cervical spine. Soft tissues and spinal canal: No prevertebral fluid or swelling. No visible canal hematoma. Disc levels: Cervical spondylosis greatest at C5-C6 where there is moderate to advanced disc height loss, posterior disc osteophyte complex as well as uncovertebral hypertrophy. Bilateral neural foraminal narrowing as well as mild bony canal stenosis at this level. Upper chest: No consolidation within the imaged lung apices. No visible pneumothorax. IMPRESSION: CT head: 1. No evidence of acute intracranial abnormality. Midline parietooccipital scalp hematoma. 2. Stable generalized parenchymal atrophy and chronic small vessel ischemic disease. CT cervical  spine: 1. No evidence of acute fracture to the cervical spine. 2. Cervical spondylosis as described and greatest at C5-C6. Multilevel degenerative grade 1 anterolisthesis. Electronically Signed   By: Kellie Simmering DO   On: 06/07/2019 12:29    ____________________________________________    PROCEDURES  Procedure(s) performed:    Procedures    Medications  acetaminophen (TYLENOL) tablet 650 mg (650 mg Oral Given 06/07/19 1329)     ____________________________________________   INITIAL IMPRESSION / ASSESSMENT AND PLAN / ED COURSE  Pertinent labs & imaging results that were available during my care of the patient were reviewed by me and considered in my medical decision making (see chart for details).  Review of the [place] CSRS was performed in accordance of the [place] prior to dispensing any controlled drugs.  Patient presented to the emergency department for evaluation of head injury today.  Vital signs and exam are reassuring.  No acute intracranial abnormality on head CT and no evidence of fracture on cervical CT.  Patient denies any pain currently. She is doing a crossword puzzle.  She is going to stay for her volunteer shift, as she feels well.  Patient is to follow up with PCP as directed. Patient is given ED precautions to return to the ED for any worsening or new symptoms.   Evette Georges C. Bracher was evaluated in Emergency Department on 06/07/2019 for the symptoms described in the history of present illness. She was evaluated in the context of the global COVID-19 pandemic, which necessitated consideration that the patient might be at risk for infection with the SARS-CoV-2 virus that causes COVID-19. Institutional protocols and algorithms that pertain to the evaluation of patients at risk for COVID-19 are in a state of rapid change based on information released by regulatory bodies including the CDC and federal and state organizations. These policies and algorithms were followed  during the patient's care in the ED.  ____________________________________________  FINAL CLINICAL IMPRESSION(S) / ED DIAGNOSES  Final diagnoses:  Injury of head, initial encounter      NEW MEDICATIONS STARTED DURING THIS VISIT:  ED Discharge Orders    None          This chart was dictated using voice recognition software/Dragon. Despite best efforts to proofread, errors can occur which can change the meaning. Any change was purely unintentional.    Laban Emperor, PA-C 06/07/19 1543    Carrie Mew, MD 06/08/19 1539

## 2019-06-07 NOTE — ED Triage Notes (Signed)
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  Small hematoma noted to posterior head.  Denies LOC, denies use of blood thinners.  NAD noted at this time.

## 2019-06-07 NOTE — Discharge Instructions (Signed)
You can ice the back of your head today.  Please return to the emergency department for severe headaches, dizziness, confusion, visual changes, vomiting, any other symptoms concerning to you.

## 2019-06-08 NOTE — Therapy (Signed)
[place] MAIN [facility] SERVICES [address] [place], [place], [address] Phone: [phone]   Fax:  [phone]  Physical Therapy Treatment  Patient Details  Name: Wanda Ochoa MRN: [id number] Date of Birth: 05-19-1945 Referring Provider (PT): Lavon Paganini   Encounter Date: 06/07/2019  PT End of Session - 06/07/19 1701    Visit Number  7    Number of Visits  17    Date for PT Re-Evaluation  06/29/19    PT Start Time  [id number]    PT Stop Time  1700    PT Time Calculation (min)  45 min    Equipment Utilized During Treatment  Gait belt    Activity Tolerance  Patient tolerated treatment well    Behavior During Therapy  WFL for tasks assessed/performed       Past Medical History:  Diagnosis Date  . Anxiety   . Arthritis    right knee  . Asthma   . HTN (hypertension)   . Thyroid disease     Past Surgical History:  Procedure Laterality Date  . ABDOMINAL HYSTERECTOMY  1990  . BLADDER SUSPENSION  1990  . BREAST CYST ASPIRATION Right 1985  . BREAST EXCISIONAL BIOPSY Right 1967   EXCISIONAL BX - NEG  . BREAST SURGERY Right 1967   Lumpectomy  . CESAREAN SECTION    . COLONOSCOPY WITH PROPOFOL N/A 12/18/2018   Procedure: COLONOSCOPY WITH PROPOFOL;  Surgeon: Lollie Sails, MD;  Location: [facility] ENDOSCOPY;  Service: Endoscopy;  Laterality: N/A;  . HEMORRHOIDECTOMY WITH HEMORRHOID BANDING  1988    There were no vitals filed for this visit.  Subjective Assessment - 06/08/19 1655    Subjective  Patient had a fall in the parking lot today but went to the MD and she thinks she is fine.    Pertinent History  Patient is having some falls and had 4 falls in the last month. She was indoors and she did not get hurt. She does not use an AD.    Limitations  Walking    How long can you sit comfortably?  unlimited    How long can you stand comfortably?  unlimited    Patient Stated Goals  to not fall    Currently in Pain?  No/denies     Pain Score  0-No pain         Neuromuscular Re-education  Rocker board fwd/bwd, side to side x 20 each direction Tandem foam without UE support x 2 lengths Side stepping on foam without UE support x 2 lengths Heel/toe raises without UE support 3s hold x 10 each 1/2 foam roll balance with flat side up 30s x 2 reps 1/2 foam roll balance with flat side down 30s x 2 reps 1/2 foam roll tandem balance alternating forward LE 30s x 2 each LE forward Lateral side steps from foam to 6 inch stool left and right x 15 Backwards stepping from foam to 6 inch stool x 15  Star stepping left and right x 10 Leg press 40 lbs x 20 x 2 Leg press heel raises x 20 x 2        Pt educated throughout session about proper posture and technique with exercises. Improved exercise technique, movement at target joints, use of target muscles after min to mod verbal, visual, tactile cues. CGA and Min to mod verbal cues used throughout with increased in postural sway and LOB most seen with narrow base of support and  while on uneven surfaces. Continues to have balance deficits typical with diagnosis. Patient performs intermediate level exercises without pain behaviors and needs verbal cuing for postural alignment and head positioning Tactile cues and assistance needed to keep lower leg and knee in neutral to avoid compensations with ankle motions.                       PT Education - 06/08/19 1700    Education Details  HEP    Person(s) Educated  Patient    Methods  Explanation    Comprehension  Verbalized understanding;Returned demonstration;Need further instruction       PT Short Term Goals - 05/04/19 1427      PT SHORT TERM GOAL #1   Title  Patient will be independent in home exercise program to improve strength/mobility for better functional independence with ADLs.    Period  Weeks    Status  New    Target Date  06/01/19        PT Long Term Goals - 05/04/19 1428      PT LONG TERM  GOAL #1   Title  Patient (> 11 years old) will complete five times sit to stand test in < 15 seconds indicating an increased LE strength and improved balance.    Time  8    Period  Weeks    Status  New    Target Date  06/29/19      PT LONG TERM GOAL #2   Title  Patient will increase Berg Balance score by > 6 points to demonstrate decreased fall risk during functional activities.    Time  8    Period  Weeks    Status  New    Target Date  06/29/19      PT LONG TERM GOAL #3   Title  Patient will reduce timed up and go to <11 seconds to reduce fall risk and demonstrate improved transfer/gait ability.    Time  8    Period  Weeks    Status  New    Target Date  06/29/19            Plan - 06/07/19 1704    Clinical Impression Statement  Pt presents with unsteadiness on uneven surfaces and narrow base of support, and fatigues with therapeutic exercises.  Patient tolerated all interventions well this date and will benefit from continued skilled PT interventions to improve strength and balance and decrease risk of falling.   Personal Factors and Comorbidities  Age    Stability/Clinical Decision Making  Stable/Uncomplicated    Rehab Potential  Poor    PT Frequency  2x / week    PT Duration  8 weeks    PT Treatment/Interventions  Patient/family education;Balance training;Neuromuscular re-education;Manual techniques;Gait training;Therapeutic activities;Therapeutic exercise    PT Next Visit Plan  balance and strengthening    Consulted and Agree with Plan of Care  Patient       Patient will benefit from skilled therapeutic intervention in order to improve the following deficits and impairments:  Abnormal gait, Difficulty walking, Decreased strength, Impaired sensation  Visit Diagnosis: Difficulty in walking, not elsewhere classified  Other abnormalities of gait and mobility  Muscle weakness (generalized)     Problem List Patient Active Problem List   Diagnosis Date Noted  .  Idiopathic gout involving toe of right foot 12/07/2018  . Effusion of right knee 06/18/2017  . Hematuria 09/11/2016  . Severe recurrent major depression without  psychotic features ([place])   . Hypercholesteremia 05/02/2015  . Mitral valve prolapse 02/21/2015  . Allergic rhinitis 12/24/2014  . Osteopenia 12/24/2014  . Hypothyroidism 11/24/2014  . Generalized anxiety disorder 02/24/2012  . Nocturnal hypoxemia 01/20/2012  . Abdominal mass 01/20/2012  . Temporary cerebral vascular dysfunction 01/02/2009  . Benign hypertension 03/28/2008  . Asthma, exogenous 10/22/2007  . Adaptive colitis 03/12/2003    [name], PT DPT 06/08/2019, 5:04 PM  [place] MAIN [facility] SERVICES [address] [place], [place], [address] Phone: [phone]   Fax:  [phone]  Name: Wanda Ochoa MRN: [id number] Date of Birth: Aug 27, 1945

## 2019-06-09 ENCOUNTER — Other Ambulatory Visit: Payer: Self-pay

## 2019-06-09 ENCOUNTER — Encounter: Payer: Self-pay | Admitting: Physical Therapy

## 2019-06-09 ENCOUNTER — Ambulatory Visit: Payer: Medicare Other | Admitting: Physical Therapy

## 2019-06-09 DIAGNOSIS — R2689 Other abnormalities of gait and mobility: Secondary | ICD-10-CM | POA: Diagnosis not present

## 2019-06-09 DIAGNOSIS — R262 Difficulty in walking, not elsewhere classified: Secondary | ICD-10-CM | POA: Diagnosis not present

## 2019-06-09 DIAGNOSIS — M6281 Muscle weakness (generalized): Secondary | ICD-10-CM | POA: Diagnosis not present

## 2019-06-09 NOTE — Therapy (Signed)
[place] MAIN [facility] SERVICES [address] [place], [place], [address] Phone: [phone]   Fax:  [phone]  Physical Therapy Treatment  Patient Details  Name: Wanda Ochoa MRN: [id number] Date of Birth: 1945/04/07 Referring Provider (PT): Lavon Paganini   Encounter Date: 06/09/2019  PT End of Session - 06/09/19 1618    Visit Number  8    Number of Visits  17    Date for PT Re-Evaluation  06/29/19    PT Start Time  1615    PT Stop Time  1700    PT Time Calculation (min)  45 min    Equipment Utilized During Treatment  Gait belt    Activity Tolerance  Patient tolerated treatment well    Behavior During Therapy  WFL for tasks assessed/performed       Past Medical History:  Diagnosis Date  . Anxiety   . Arthritis    right knee  . Asthma   . HTN (hypertension)   . Thyroid disease     Past Surgical History:  Procedure Laterality Date  . ABDOMINAL HYSTERECTOMY  1990  . BLADDER SUSPENSION  1990  . BREAST CYST ASPIRATION Right 1985  . BREAST EXCISIONAL BIOPSY Right 1967   EXCISIONAL BX - NEG  . BREAST SURGERY Right 1967   Lumpectomy  . CESAREAN SECTION    . COLONOSCOPY WITH PROPOFOL N/A 12/18/2018   Procedure: COLONOSCOPY WITH PROPOFOL;  Surgeon: Lollie Sails, MD;  Location: [facility] END[facility]COPY;  Service: Endoscopy;  Laterality: N/A;  . HEMORRHOIDECTOMY WITH HEMORRHOID BANDING  1988    There were no vitals filed for this visit.  Subjective Assessment - 06/09/19 1617    Subjective  Patient had a fall in the parking lot today but went to the MD and she thinks she is fine.    Pertinent History  Patient is having some falls and had 4 falls in the last month. She was indoors and she did not get hurt. She does not use an AD.    Limitations  Walking    How long can you sit comfortably?  unlimited    How long can you stand comfortably?  unlimited    Patient Stated Goals  to not fall    Currently in Pain?  No/denies     Pain Score  0-No pain       Treatment: Neuromuscular Training:  Stool and foam : staggered stance, head turns side/side, up/down x 5 reps each, each foot in front; VCs for proper technique and positioning for each exercise staggered stance, trunk rotation with 2 lb rod, VC to keep UE straight and turn head with trunk  1/2 foam: Forward and backward stepping over 1/2 foam x15 each direction, VCs to take big enough steps and to try to increase speed to work on coordination  Side stepping over 1/2 foam  15x each direction  Blue Foam: Side stepping x10 on blue balance CGA for safety, VCs for taking a big enough step  Airex pad trunk rotation x 2 min, CGA for safety, demonstrated difficulty with keeping arms extended and full rotation with head turn Airex pad, balloon tapping to mirror x2 min, supervision for safety with varying directions and speed of balloon, VCs for utilizing both hands and minimizing UE support  Leg press: 40 lbs hip and knee x 20 x 2  25 lbs ankle DF x 20 x 2  Floor Star exercise: Performed stepping on the star diagram on the  floor. Working on weight-shifting forward onto the foot that stepped forward and then weight-shifting back and bringing her feet back together with CGA. Performed 2 reps each foot.   Matrix: Fwd/bwd gait with 22. 5 lbs and CGA, cues for posture and stepping strategies, occasional LOB Side stepping left and right and CGA with cues to slow movement    Pt educated throughout session about proper posture and technique with exercises. Improved exercise technique, movement at target joints, use of target muscles after min to mod verbal, visual, tactile cues. CGA and Min to mod verbal cues used throughout with increased in postural sway and LOB most seen with narrow base of support and while on uneven surfaces. Continues to have balance deficits typical with diagnosis.                       PT Education - 06/09/19 1618     Education Details  HEP    Person(s) Educated  Patient    Methods  Explanation;Demonstration    Comprehension  Verbalized understanding;Returned demonstration;Need further instruction       PT Short Term Goals - 05/04/19 1427      PT SHORT TERM GOAL #1   Title  Patient will be independent in home exercise program to improve strength/mobility for better functional independence with ADLs.    Period  Weeks    Status  New    Target Date  06/01/19        PT Long Term Goals - 05/04/19 1428      PT LONG TERM GOAL #1   Title  Patient (> 54 years old) will complete five times sit to stand test in < 15 seconds indicating an increased LE strength and improved balance.    Time  8    Period  Weeks    Status  New    Target Date  06/29/19      PT LONG TERM GOAL #2   Title  Patient will increase Berg Balance score by > 6 points to demonstrate decreased fall risk during functional activities.    Time  8    Period  Weeks    Status  New    Target Date  06/29/19      PT LONG TERM GOAL #3   Title  Patient will reduce timed up and go to <11 seconds to reduce fall risk and demonstrate improved transfer/gait ability.    Time  8    Period  Weeks    Status  New    Target Date  06/29/19            Plan - 06/09/19 1619    Clinical Impression Statement  Pt presents with unsteadiness on uneven surfaces and fatigues with therapeutic exercises. Patient needs assist with instruction for balance with side stepping on uneven surfaces and needs CGA assist with balance activities. Patient demonstrates difficulty with side stepping on foam and navigating small spaces with decreased base of support and increased single leg challenges for LE.  Patient tolerated all interventions well this date and will benefit from continued skilled PT interventions to improve strength and balance and decrease risk of falling   Personal Factors and Comorbidities  Age    Stability/Clinical Decision Making   Stable/Uncomplicated    Rehab Potential  Poor    PT Frequency  2x / week    PT Duration  8 weeks    PT Treatment/Interventions  Patient/family education;Balance training;Neuromuscular re-education;Manual techniques;Gait training;Therapeutic activities;Therapeutic  exercise    PT Next Visit Plan  balance and strengthening    Consulted and Agree with Plan of Care  Patient       Patient will benefit from skilled therapeutic intervention in order to improve the following deficits and impairments:  Abnormal gait, Difficulty walking, Decreased strength, Impaired sensation  Visit Diagnosis: Difficulty in walking, not elsewhere classified  Other abnormalities of gait and mobility  Muscle weakness (generalized)     Problem List Patient Active Problem List   Diagnosis Date Noted  . Idiopathic gout involving toe of right foot 12/07/2018  . Effusion of right knee 06/18/2017  . Hematuria 09/11/2016  . Severe recurrent major depression without psychotic features ([place])   . Hypercholesteremia 05/02/2015  . Mitral valve prolapse 02/21/2015  . Allergic rhinitis 12/24/2014  . Osteopenia 12/24/2014  . Hypothyroidism 11/24/2014  . Generalized anxiety disorder 02/24/2012  . Nocturnal hypoxemia 01/20/2012  . Abdominal mass 01/20/2012  . Temporary cerebral vascular dysfunction 01/02/2009  . Benign hypertension 03/28/2008  . Asthma, exogenous 10/22/2007  . Adaptive colitis 03/12/2003    [name] , PT DPT 06/09/2019, 4:20 PM  [place] MAIN [facility] SERVICES [address] [place], [place], [address] Phone: [phone]   Fax:  [phone]  Name: Wanda Ochoa MRN: [id number] Date of Birth: 11-04-1945

## 2019-06-14 ENCOUNTER — Ambulatory Visit: Payer: Medicare Other

## 2019-06-14 ENCOUNTER — Other Ambulatory Visit: Payer: Self-pay

## 2019-06-14 DIAGNOSIS — R2689 Other abnormalities of gait and mobility: Secondary | ICD-10-CM

## 2019-06-14 DIAGNOSIS — R262 Difficulty in walking, not elsewhere classified: Secondary | ICD-10-CM

## 2019-06-14 DIAGNOSIS — M6281 Muscle weakness (generalized): Secondary | ICD-10-CM | POA: Diagnosis not present

## 2019-06-14 NOTE — Therapy (Signed)
[place] MAIN [facility] SERVICES [address] [place], [place], [address] Phone: [phone]   Fax:  [phone]  Physical Therapy Treatment  Patient Details  Name: Wanda Ochoa. Rakers MRN: [id number] Date of Birth: 06-24-1945 Referring Provider (PT): Lavon Paganini   Encounter Date: 06/14/2019  PT End of Session - 06/14/19 1607    Visit Number  9    Number of Visits  17    Date for PT Re-Evaluation  06/29/19    PT Start Time  1600    PT Stop Time  1644    PT Time Calculation (min)  44 min    Equipment Utilized During Treatment  Gait belt    Activity Tolerance  Patient tolerated treatment well    Behavior During Therapy  WFL for tasks assessed/performed       Past Medical History:  Diagnosis Date  . Anxiety   . Arthritis    right knee  . Asthma   . HTN (hypertension)   . Thyroid disease     Past Surgical History:  Procedure Laterality Date  . ABDOMINAL HYSTERECTOMY  1990  . BLADDER SUSPENSION  1990  . BREAST CYST ASPIRATION Right 1985  . BREAST EXCISIONAL BIOPSY Right 1967   EXCISIONAL BX - NEG  . BREAST SURGERY Right 1967   Lumpectomy  . CESAREAN SECTION    . COLONOSCOPY WITH PROPOFOL N/A 12/18/2018   Procedure: COLONOSCOPY WITH PROPOFOL;  Surgeon: Lollie Sails, MD;  Location: [facility] ENDOSCOPY;  Service: Endoscopy;  Laterality: N/A;  . HEMORRHOIDECTOMY WITH HEMORRHOID BANDING  1988    There were no vitals filed for this visit.  Subjective Assessment - 06/14/19 1605    Subjective  Patient reports she has been doing well. No more falls since her one last week. Has been compliant with HEP.    Pertinent History  Patient is having some falls and had 4 falls in the last month. She was indoors and she did not get hurt. She does not use an AD.    Limitations  Walking    How long can you sit comfortably?  unlimited    How long can you stand comfortably?  unlimited    Patient Stated Goals  to not fall    Currently  in Pain?  No/denies            Octane fitness Lvl 6 4 minutes for cardiovascular challenge.     Neuromuscular Re-education    Obstacle course : step over two hurdles two half foam rollers and around 3 cones airex pad: 6: step tandem stance; hold 30 seconds one foot on each surface x2 trials each LE    airex pad with dyna disc modified tandem stance 30 second holds each foot position x2 trials each LE   airex pad under feet sit to stand with UE support 10x   airex pad: 6" step horizontal taps 15x each LE  airex pad 6" forward toe taps no UE support 15x each LE      8 cones:  -side step ; very challenging with frequent circumduction  Initially x 4 trials each direction -forward backwards weave 4x length.  -lateral step, toe tap each LE to cone, and side step to the next 2x length, very challenging for single limb stability.      TherEx 4lb ankle weight:  -hip extension 12x each LE, BUE support  Seated:    4lb ankle weight: LAQ 10x each LE; cues for decreasing velocity  of movement Marching with upright posture, 10x each LE, arms crossed  RTB around toes: df with marching 10x each LE   Pt educated throughout session about proper posture and technique with exercises. Improved exercise technique, movement at target joints, use of target muscles after min to mod verbal, visual, tactile cues                        PT Education - 06/14/19 1606    Education Details  exercise technique, body mechanics    Person(s) Educated  Patient    Methods  Explanation;Demonstration;Tactile cues;Verbal cues    Comprehension  Verbalized understanding;Returned demonstration;Verbal cues required;Tactile cues required       PT Short Term Goals - 05/04/19 1427      PT SHORT TERM GOAL #1   Title  Patient will be independent in home exercise program to improve strength/mobility for better functional independence with ADLs.    Period  Weeks    Status  New    Target Date   06/01/19        PT Long Term Goals - 05/04/19 1428      PT LONG TERM GOAL #1   Title  Patient (> 35 years old) will complete five times sit to stand test in < 15 seconds indicating an increased LE strength and improved balance.    Time  8    Period  Weeks    Status  New    Target Date  06/29/19      PT LONG TERM GOAL #2   Title  Patient will increase Berg Balance score by > 6 points to demonstrate decreased fall risk during functional activities.    Time  8    Period  Weeks    Status  New    Target Date  06/29/19      PT LONG TERM GOAL #3   Title  Patient will reduce timed up and go to <11 seconds to reduce fall risk and demonstrate improved transfer/gait ability.    Time  8    Period  Weeks    Status  New    Target Date  06/29/19            Plan - 06/14/19 1746    Clinical Impression Statement  Patient presents to physical therapy with excellent motivation. Dual challenge tasks as well as interventions that require single limb stability are challenging for the patient. Ankle righting reactions on unstable surfaces are improving with decreased upper chain reactions. Pt will benefit from skilled PT services to address deficits in balance and strength in order to improve function and decrease fall risk.    Personal Factors and Comorbidities  Age    Stability/Clinical Decision Making  Stable/Uncomplicated    Rehab Potential  Poor    PT Frequency  2x / week    PT Duration  8 weeks    PT Treatment/Interventions  Patient/family education;Balance training;Neuromuscular re-education;Manual techniques;Gait training;Therapeutic activities;Therapeutic exercise    PT Next Visit Plan  balance and strengthening    Consulted and Agree with Plan of Care  Patient       Patient will benefit from skilled therapeutic intervention in order to improve the following deficits and impairments:  Abnormal gait, Difficulty walking, Decreased strength, Impaired sensation  Visit  Diagnosis: Difficulty in walking, not elsewhere classified  Other abnormalities of gait and mobility  Muscle weakness (generalized)     Problem List Patient Active Problem List  Diagnosis Date Noted  . Idiopathic gout involving toe of right foot 12/07/2018  . Effusion of right knee 06/18/2017  . Hematuria 09/11/2016  . Severe recurrent major depression without psychotic features ([place])   . Hypercholesteremia 05/02/2015  . Mitral valve prolapse 02/21/2015  . Allergic rhinitis 12/24/2014  . Osteopenia 12/24/2014  . Hypothyroidism 11/24/2014  . Generalized anxiety disorder 02/24/2012  . Nocturnal hypoxemia 01/20/2012  . Abdominal mass 01/20/2012  . Temporary cerebral vascular dysfunction 01/02/2009  . Benign hypertension 03/28/2008  . Asthma, exogenous 10/22/2007  . Adaptive colitis 03/12/2003    Janna Arch, PT, DPT   06/14/2019, 5:47 PM  [place] MAIN [facility] SERVICES [address] [place], [place], [address] Phone: [phone]   Fax:  [phone]  Name: Sedra Kinsman. Kuntzman MRN: [id number] Date of Birth: 03-16-1946

## 2019-06-15 DIAGNOSIS — F331 Major depressive disorder, recurrent, moderate: Secondary | ICD-10-CM | POA: Diagnosis not present

## 2019-06-15 DIAGNOSIS — F411 Generalized anxiety disorder: Secondary | ICD-10-CM | POA: Diagnosis not present

## 2019-06-16 ENCOUNTER — Encounter: Payer: Self-pay | Admitting: Physical Therapy

## 2019-06-16 ENCOUNTER — Other Ambulatory Visit: Payer: Self-pay

## 2019-06-16 ENCOUNTER — Ambulatory Visit: Payer: Medicare Other | Admitting: Physical Therapy

## 2019-06-16 DIAGNOSIS — M6281 Muscle weakness (generalized): Secondary | ICD-10-CM

## 2019-06-16 DIAGNOSIS — R262 Difficulty in walking, not elsewhere classified: Secondary | ICD-10-CM

## 2019-06-16 DIAGNOSIS — R2689 Other abnormalities of gait and mobility: Secondary | ICD-10-CM

## 2019-06-16 NOTE — Therapy (Signed)
[place] MAIN [facility] SERVICES [address] [place], [place], [address] Phone: [phone]   Fax:  [phone]  Physical Therapy Treatment Physical Therapy Progress Note   Dates of reporting period 05/04/19 to  06/16/19  Patient Details  Name: Wanda Ochoa [place] MRN: [id number] Date of Birth: 05-14-1945 Referring Provider (PT): Lavon Paganini   Encounter Date: 06/16/2019  PT End of Session - 06/16/19 1611    Visit Number  10    Number of Visits  17    Date for PT Re-Evaluation  06/29/19    PT Start Time  1605    PT Stop Time  1645    PT Time Calculation (min)  40 min    Equipment Utilized During Treatment  Gait belt    Activity Tolerance  Patient tolerated treatment well    Behavior During Therapy  WFL for tasks assessed/performed       Past Medical History:  Diagnosis Date  . Anxiety   . Arthritis    right knee  . Asthma   . HTN (hypertension)   . Thyroid disease     Past Surgical History:  Procedure Laterality Date  . ABDOMINAL HYSTERECTOMY  1990  . BLADDER SUSPENSION  1990  . BREAST CYST ASPIRATION Right 1985  . BREAST EXCISIONAL BIOPSY Right 1967   EXCISIONAL BX - NEG  . BREAST SURGERY Right 1967   Lumpectomy  . CESAREAN SECTION    . COLONOSCOPY WITH PROPOFOL N/A 12/18/2018   Procedure: COLONOSCOPY WITH PROPOFOL;  Surgeon: Lollie Sails, MD;  Location: [facility] ENDOSCOPY;  Service: Endoscopy;  Laterality: N/A;  . HEMORRHOIDECTOMY WITH HEMORRHOID BANDING  1988    There were no vitals filed for this visit.  Subjective Assessment - 06/16/19 1610    Subjective  Patient reports she has been doing well. No more falls since her one last week. Has been compliant with HEP.    Pertinent History  Patient is having some falls and had 4 falls in the last month. She was indoors and she did not get hurt. She does not use an AD.    Limitations  Walking    How long can you sit comfortably?  unlimited    How long can you  stand comfortably?  unlimited    Patient Stated Goals  to not fall    Currently in Pain?  No/denies    Pain Score  0-No pain         [facility] PT Assessment - 06/16/19 0001      Standardized Balance Assessment   Standardized Balance Assessment  Berg Balance Test      Berg Balance Test   Sit to Stand  Able to stand without using hands and stabilize independently    Standing Unsupported  Able to stand safely 2 minutes    Sitting with Back Unsupported but Feet Supported on Floor or Stool  Able to sit safely and securely 2 minutes    Stand to Sit  Sits safely with minimal use of hands    Transfers  Able to transfer safely, minor use of hands    Standing Unsupported with Eyes Closed  Able to stand 10 seconds safely    Standing Unsupported with Feet Together  Able to place feet together independently and stand 1 minute safely    From Standing, Reach Forward with Outstretched Arm  Can reach confidently >25 cm (10")    From Standing Position, Pick up Object from Floor  Able to  pick up shoe safely and easily    From Standing Position, Turn to Look Behind Over each Shoulder  Looks behind one side only/other side shows less weight shift    Turn 360 Degrees  Able to turn 360 degrees safely in 4 seconds or less    Standing Unsupported, Alternately Place Feet on Step/Stool  Able to stand independently and safely and complete 8 steps in 20 seconds    Standing Unsupported, One Foot in Front  Able to take small step independently and hold 30 seconds    Standing on One Leg  Able to lift leg independently and hold 5-10 seconds    Total Score  52      Treatment: Outcome measures were performed with progress made towards goals Berg balance, 5 x sit to stand, TUG, performed. Reviewed progress with patient                        PT Education - 06/16/19 1610    Education Details  HEP    Person(s) Educated  Patient    Methods  Explanation    Comprehension  Verbalized  understanding;Returned demonstration;Need further instruction       PT Short Term Goals - 06/16/19 1629      PT SHORT TERM GOAL #1   Title  Patient will be independent in home exercise program to improve strength/mobility for better functional independence with ADLs.    Period  Weeks    Status  New    Target Date  06/01/19        PT Long Term Goals - 06/16/19 1612      PT LONG TERM GOAL #1   Title  Patient (> 21 years old) will complete five times sit to stand test in < 15 seconds indicating an increased LE strength and improved balance.    Baseline  06/16/19=16.66 sec    Time  8    Period  Weeks    Status  Partially Met    Target Date  06/29/19      PT LONG TERM GOAL #2   Title  Patient will increase Berg Balance score by > 6 points to demonstrate decreased fall risk during functional activities.    Baseline  06/16/19=52/56    Time  8    Period  Weeks    Status  Partially Met    Target Date  06/29/19      PT LONG TERM GOAL #3   Title  Patient will reduce timed up and go to <11 seconds to reduce fall risk and demonstrate improved transfer/gait ability.    Baseline  06/16/19=11.64    Time  8    Period  Weeks    Status  Partially Met    Target Date  06/29/19            Plan - 06/16/19 1611    Clinical Impression Statement  [name] condition has the potential to improve in response to therapy. Maximum improvement is yet to be obtained. The anticipated improvement is attainable and reasonable in a generally predictable time.  Patient reports that she is feeling a bit more steady. Patient required min verbal cues to perform matrix fwd/ bwd/ side stepping with weights for posture and control and required verbal and tactile cues during all dynamic standing balance activities. Patient demonstrated decreased gait speed with ambulation without AD.  Patient will continue to benefit from skilled therapy in order to improve strength, dynamic standing balance  and increase gait speed  to reduce risk for falls   Personal Factors and Comorbidities  Age    Stability/Clinical Decision Making  Stable/Uncomplicated    Rehab Potential  Poor    PT Frequency  2x / week    PT Duration  8 weeks    PT Treatment/Interventions  Patient/family education;Balance training;Neuromuscular re-education;Manual techniques;Gait training;Therapeutic activities;Therapeutic exercise    PT Next Visit Plan  balance and strengthening    Consulted and Agree with Plan of Care  Patient       Patient will benefit from skilled therapeutic intervention in order to improve the following deficits and impairments:  Abnormal gait, Difficulty walking, Decreased strength, Impaired sensation  Visit Diagnosis: Difficulty in walking, not elsewhere classified  Other abnormalities of gait and mobility  Muscle weakness (generalized)     Problem List Patient Active Problem List   Diagnosis Date Noted  . Idiopathic gout involving toe of right foot 12/07/2018  . Effusion of right knee 06/18/2017  . Hematuria 09/11/2016  . Severe recurrent major depression without psychotic features ([place])   . Hypercholesteremia 05/02/2015  . Mitral valve prolapse 02/21/2015  . Allergic rhinitis 12/24/2014  . Osteopenia 12/24/2014  . Hypothyroidism 11/24/2014  . Generalized anxiety disorder 02/24/2012  . Nocturnal hypoxemia 01/20/2012  . Abdominal mass 01/20/2012  . Temporary cerebral vascular dysfunction 01/02/2009  . Benign hypertension 03/28/2008  . Asthma, exogenous 10/22/2007  . Adaptive colitis 03/12/2003    [name], PT DPT 06/16/2019, 5:16 PM  [place] MAIN [facility] SERVICES [address] [place], [place], [address] Phone: [phone]   Fax:  [phone]  Name: Danese Dorsainvil. Andreen MRN: [id number] Date of Birth: 01/22/1946

## 2019-06-21 ENCOUNTER — Other Ambulatory Visit: Payer: Self-pay

## 2019-06-21 ENCOUNTER — Ambulatory Visit: Payer: Medicare Other

## 2019-06-21 DIAGNOSIS — M6281 Muscle weakness (generalized): Secondary | ICD-10-CM

## 2019-06-21 DIAGNOSIS — R2689 Other abnormalities of gait and mobility: Secondary | ICD-10-CM

## 2019-06-21 DIAGNOSIS — R262 Difficulty in walking, not elsewhere classified: Secondary | ICD-10-CM | POA: Diagnosis not present

## 2019-06-21 NOTE — Therapy (Signed)
[place] MAIN [facility] SERVICES [address] [place], [place], [address] Phone: [phone]   Fax:  [phone]  Physical Therapy Treatment  Patient Details  Name: Wanda Ochoa MRN: [id number] Date of Birth: Nov 16, 1945 Referring Provider (PT): Lavon Paganini   Encounter Date: 06/21/2019  PT End of Session - 06/21/19 1625    Visit Number  11    Number of Visits  17    Date for PT Re-Evaluation  06/29/19    PT Start Time  1615    PT Stop Time  1700    PT Time Calculation (min)  45 min    Equipment Utilized During Treatment  Gait belt    Activity Tolerance  Patient tolerated treatment well    Behavior During Therapy  WFL for tasks assessed/performed       Past Medical History:  Diagnosis Date  . Anxiety   . Arthritis    right knee  . Asthma   . HTN (hypertension)   . Thyroid disease     Past Surgical History:  Procedure Laterality Date  . ABDOMINAL HYSTERECTOMY  1990  . BLADDER SUSPENSION  1990  . BREAST CYST ASPIRATION Right 1985  . BREAST EXCISIONAL BIOPSY Right 1967   EXCISIONAL BX - NEG  . BREAST SURGERY Right 1967   Lumpectomy  . CESAREAN SECTION    . COLONOSCOPY WITH PROPOFOL N/A 12/18/2018   Procedure: COLONOSCOPY WITH PROPOFOL;  Surgeon: Lollie Sails, MD;  Location: [facility] ENDOSCOPY;  Service: Endoscopy;  Laterality: N/A;  . HEMORRHOIDECTOMY WITH HEMORRHOID BANDING  1988    There were no vitals filed for this visit.  Subjective Assessment - 06/21/19 1617    Subjective  Patient reports she has been doing well. No more falls since last therapy session. Has been compliant with HEP and is going to the [employer]. Reports some R knee pain with [facility]Step at [facility]. She went this morning and is having 4/10 R knee pain upon arrival with activity. Otherwise no specific questions or concerns.    Pertinent History  Patient is having some falls and had 4 falls in the last month. She was indoors and she did not get  hurt. She does not use an AD.    Limitations  Walking    How long can you sit comfortably?  unlimited    How long can you stand comfortably?  unlimited    Patient Stated Goals  to not fall    Currently in Pain?  Yes    Pain Score  4     Pain Location  Knee    Pain Orientation  Right    Pain Descriptors / Indicators  Stabbing    Pain Type  Chronic pain    Pain Onset  More than a month ago          TREATMENT   Ther-ex  Standing with 5# ankle weight, BUE support: Hip flexion marching x 20 bilateral; Hip abduction x 20 bilateral; Hip extension x 20 bilateral;  Seated with 5# ankle weight: LAQ x 20 bilateral; Marching x 20 bilateral;  Clams with green tband x 20 bilateral; Adductor ball squeeze 3s hold x 20;   Neuromuscular Re-education Airex pad: 6" step forward and lateral toe taps x 10 each direction; Front foot on 6" step alternating LE x 30s each; Front foot on 6" step alternating LE with horizontal and vertical head turns x 30s each; Front foot on dynadisc alternating LE x 30s each;  Airex balance beam tandem gait x 6 lengths; Airex balance beam side stepping x 6 lengths; Rockerboard in A/P and R/L orientation static balance x 30s each; Rockerboard in A/P orientation and R/L orientation with heel/toe rocking and R/L weight shifts respectively x 30s each;   Pt educated throughout session about proper posture and technique with exercises. Improved exercise technique, movement at target joints, use of target muscles after min to mod verbal, visual, tactile cues   Pt demonstrates excellent motivation during session today. Continued with LE strengthening and progressed ankle weights as well as repetitions today. Also continued with balance exercises incorporating unstable surfaces. Pt encoruaged to continue her HEP and follow-up as scheduled. Pt will benefit from PT services to address deficits in strength, balance, and mobility in order to return to full function  at home.                       PT Short Term Goals - 06/16/19 1629      PT SHORT TERM GOAL #1   Title  Patient will be independent in home exercise program to improve strength/mobility for better functional independence with ADLs.    Period  Weeks    Status  New    Target Date  06/01/19        PT Long Term Goals - 06/16/19 1612      PT LONG TERM GOAL #1   Title  Patient (> 51 years old) will complete five times sit to stand test in < 15 seconds indicating an increased LE strength and improved balance.    Baseline  06/16/19=16.66 sec    Time  8    Period  Weeks    Status  Partially Met    Target Date  06/29/19      PT LONG TERM GOAL #2   Title  Patient will increase Berg Balance score by > 6 points to demonstrate decreased fall risk during functional activities.    Baseline  06/16/19=52/56    Time  8    Period  Weeks    Status  Partially Met    Target Date  06/29/19      PT LONG TERM GOAL #3   Title  Patient will reduce timed up and go to <11 seconds to reduce fall risk and demonstrate improved transfer/gait ability.    Baseline  06/16/19=11.64    Time  8    Period  Weeks    Status  Partially Met    Target Date  06/29/19            Plan - 06/21/19 1625    Clinical Impression Statement  Pt demonstrates excellent motivation during session today. Continued with LE strengthening and progressed ankle weights as well as repetitions today. Also continued with balance exercises incorporating unstable surfaces. Pt encoruaged to continue her HEP and follow-up as scheduled. Pt will benefit from PT services to address deficits in strength, balance, and mobility in order to return to full function at home.    Personal Factors and Comorbidities  Age    Stability/Clinical Decision Making  Stable/Uncomplicated    Rehab Potential  Poor    PT Frequency  2x / week    PT Duration  8 weeks    PT Treatment/Interventions  Patient/family education;Balance  training;Neuromuscular re-education;Manual techniques;Gait training;Therapeutic activities;Therapeutic exercise    PT Next Visit Plan  balance and strengthening    Consulted and Agree with Plan of Care  Patient  Patient will benefit from skilled therapeutic intervention in order to improve the following deficits and impairments:  Abnormal gait, Difficulty walking, Decreased strength, Impaired sensation  Visit Diagnosis: Difficulty in walking, not elsewhere classified  Other abnormalities of gait and mobility  Muscle weakness (generalized)     Problem List Patient Active Problem List   Diagnosis Date Noted  . Idiopathic gout involving toe of right foot 12/07/2018  . Effusion of right knee 06/18/2017  . Hematuria 09/11/2016  . Severe recurrent major depression without psychotic features ([place])   . Hypercholesteremia 05/02/2015  . Mitral valve prolapse 02/21/2015  . Allergic rhinitis 12/24/2014  . Osteopenia 12/24/2014  . Hypothyroidism 11/24/2014  . Generalized anxiety disorder 02/24/2012  . Nocturnal hypoxemia 01/20/2012  . Abdominal mass 01/20/2012  . Temporary cerebral vascular dysfunction 01/02/2009  . Benign hypertension 03/28/2008  . Asthma, exogenous 10/22/2007  . Adaptive colitis 03/12/2003   Phillips Grout PT, DPT, GCS  Deveion Denz 06/21/2019, 5:18 PM  [place] MAIN [facility] SERVICES [address] [place], [place], [address] Phone: [phone]   Fax:  [phone]  Name: Wanda Ochoa MRN: [id number] Date of Birth: 1945-10-15

## 2019-06-23 ENCOUNTER — Encounter: Payer: Self-pay | Admitting: Physical Therapy

## 2019-06-23 ENCOUNTER — Other Ambulatory Visit: Payer: Self-pay

## 2019-06-23 ENCOUNTER — Ambulatory Visit: Payer: Medicare Other | Admitting: Physical Therapy

## 2019-06-23 DIAGNOSIS — M6281 Muscle weakness (generalized): Secondary | ICD-10-CM

## 2019-06-23 DIAGNOSIS — R262 Difficulty in walking, not elsewhere classified: Secondary | ICD-10-CM | POA: Diagnosis not present

## 2019-06-23 DIAGNOSIS — R2689 Other abnormalities of gait and mobility: Secondary | ICD-10-CM

## 2019-06-23 NOTE — Therapy (Signed)
[place] MAIN [facility] SERVICES [address] [place], [place], [address] Phone: [phone]   Fax:  [phone]  Physical Therapy Treatment  Patient Details  Name: Wanda Ochoa MRN: [id number] Date of Birth: 02-26-1946 Referring Provider (PT): Lavon Paganini   Encounter Date: 06/23/2019  PT End of Session - 06/23/19 1608    Visit Number  12    Number of Visits  17    Date for PT Re-Evaluation  06/29/19    PT Start Time  1600    PT Stop Time  1640    PT Time Calculation (min)  40 min    Equipment Utilized During Treatment  Gait belt    Activity Tolerance  Patient tolerated treatment well    Behavior During Therapy  WFL for tasks assessed/performed       Past Medical History:  Diagnosis Date  . Anxiety   . Arthritis    right knee  . Asthma   . HTN (hypertension)   . Thyroid disease     Past Surgical History:  Procedure Laterality Date  . ABDOMINAL HYSTERECTOMY  1990  . BLADDER SUSPENSION  1990  . BREAST CYST ASPIRATION Right 1985  . BREAST EXCISIONAL BIOPSY Right 1967   EXCISIONAL BX - NEG  . BREAST SURGERY Right 1967   Lumpectomy  . CESAREAN SECTION    . COLONOSCOPY WITH PROPOFOL N/A 12/18/2018   Procedure: COLONOSCOPY WITH PROPOFOL;  Surgeon: Lollie Sails, MD;  Location: [facility] ENDOSCOPY;  Service: Endoscopy;  Laterality: N/A;  . HEMORRHOIDECTOMY WITH HEMORRHOID BANDING  1988    There were no vitals filed for this visit.  Subjective Assessment - 06/23/19 1607    Subjective  Patient reports she has been doing well. No more falls since last therapy session. Has been compliant with HEP and is going to the [employer]. Reports some R knee pain with NuStep at [facility]. She went this morning and is having 8/10 R knee pain.    Pertinent History  Patient is having some falls and had 4 falls in the last month. She was indoors and she did not get hurt. She does not use an AD.    Limitations  Walking    How long can  you sit comfortably?  unlimited    How long can you stand comfortably?  unlimited    Patient Stated Goals  to not fall    Currently in Pain?  Yes    Pain Score  8     Pain Location  Knee    Pain Orientation  Right    Pain Descriptors / Indicators  Aching    Pain Onset  More than a month ago         Ther-ex  Nu-step  x 5 mins  Hip flexion marches with 2# ankle weights x 10 bilateral; Hip abduction with 2# x 10 bilateral Hip extension with 2# x 10 bilateral Lunges to BOSU ball x 15 BLE Heel raises x 15 x 2 sets Step ups to 6-inch stool x 20  Eccentric step downs from 3 inch stool x 10 verbal cues to complete slow and tap heel.  BUE CGA  Neuromuscular Training: Balloon tapping to mirror feet together x 2 mins Stool: staggered stance, head turns side/side, up/down x 5 reps each, each foot in front; VCs for proper technique and positioning for each exercise staggered stance, trunk rotation with 2 lb rod, VC to keep UE straight and turn head with trunk  Hurdle: Forward and backward stepping over hurdle x15 each direction, VCs to take big enough steps and to try to increase speed to work on coordination  Side stepping over hurdle 15x each direction  BOSU: Lunge to BOSU ball x 10 , cues for going slow and to control the speed   Matrix: Fwd/bwd gait with 22. 5 lbs and CGA, cues for posture and stepping strategies, occasional LOB Side stepping left and right and CGA with cues to slow movement    Pt educated throughout session about proper posture and technique with exercises. Improved exercise technique, movement at target joints, use of target muscles after min to mod verbal, visual, tactile cues. CGA and Min to mod verbal cues used throughout with increased in postural sway and LOB most seen with narrow base of support and while on uneven surfaces. Continues to have balance deficits typical with diagnosis. Patient performs intermediate level exercises without pain behaviors and  needs verbal cuing for postural alignment and head positioning Tactile cues and assistance needed to keep lower leg and knee in neutral to avoid compensations with ankle motions. Patient needs occasional verbal cueing to improve posture and cueing to correctly perform exercises slowly, holding at end of range to increase motor firing of desired muscle to encourage fatigue.                        PT Education - 06/23/19 1608    Education Details  HEP    Person(s) Educated  Patient    Methods  Explanation    Comprehension  Verbalized understanding;Returned demonstration;Need further instruction       PT Short Term Goals - 06/16/19 1629      PT SHORT TERM GOAL #1   Title  Patient will be independent in home exercise program to improve strength/mobility for better functional independence with ADLs.    Period  Weeks    Status  New    Target Date  06/01/19        PT Long Term Goals - 06/16/19 1612      PT LONG TERM GOAL #1   Title  Patient (> 3 years old) will complete five times sit to stand test in < 15 seconds indicating an increased LE strength and improved balance.    Baseline  06/16/19=16.66 sec    Time  8    Period  Weeks    Status  Partially Met    Target Date  06/29/19      PT LONG TERM GOAL #2   Title  Patient will increase Berg Balance score by > 6 points to demonstrate decreased fall risk during functional activities.    Baseline  06/16/19=52/56    Time  8    Period  Weeks    Status  Partially Met    Target Date  06/29/19      PT LONG TERM GOAL #3   Title  Patient will reduce timed up and go to <11 seconds to reduce fall risk and demonstrate improved transfer/gait ability.    Baseline  06/16/19=11.64    Time  8    Period  Weeks    Status  Partially Met    Target Date  06/29/19            Plan - 06/23/19 1609    Clinical Impression Statement  Pt presents with unsteadiness on uneven surfaces and fatigues with therapeutic exercises.  Patient needs assist with beginning moderate balance activities and  needs CGA assist with standing activities. Patient demonstrates difficulty with dynamic standing balance and with narrow base of support and increased challenges for LE.  Patient tolerated all interventions well this date and skilled PT will continue to improve mobility and strength.    Personal Factors and Comorbidities  Age    Stability/Clinical Decision Making  Stable/Uncomplicated    Rehab Potential  Poor    PT Frequency  2x / week    PT Duration  8 weeks    PT Treatment/Interventions  Patient/family education;Balance training;Neuromuscular re-education;Manual techniques;Gait training;Therapeutic activities;Therapeutic exercise    PT Next Visit Plan  balance and strengthening    Consulted and Agree with Plan of Care  Patient       Patient will benefit from skilled therapeutic intervention in order to improve the following deficits and impairments:  Abnormal gait, Difficulty walking, Decreased strength, Impaired sensation  Visit Diagnosis: Other abnormalities of gait and mobility  Difficulty in walking, not elsewhere classified  Muscle weakness (generalized)     Problem List Patient Active Problem List   Diagnosis Date Noted  . Idiopathic gout involving toe of right foot 12/07/2018  . Effusion of right knee 06/18/2017  . Hematuria 09/11/2016  . Severe recurrent major depression without psychotic features ([place])   . Hypercholesteremia 05/02/2015  . Mitral valve prolapse 02/21/2015  . Allergic rhinitis 12/24/2014  . Osteopenia 12/24/2014  . Hypothyroidism 11/24/2014  . Generalized anxiety disorder 02/24/2012  . Nocturnal hypoxemia 01/20/2012  . Abdominal mass 01/20/2012  . Temporary cerebral vascular dysfunction 01/02/2009  . Benign hypertension 03/28/2008  . Asthma, exogenous 10/22/2007  . Adaptive colitis 03/12/2003    [name], PT DPT 06/23/2019, 4:10 PM  [place] MAIN [facility] SERVICES [address] [place], [place], [address] Phone: [phone]   Fax:  [phone]  Name: Wanda Ochoa MRN: [id number] Date of Birth: 1945/11/02

## 2019-06-28 ENCOUNTER — Ambulatory Visit: Payer: Medicare Other | Admitting: Physical Therapy

## 2019-06-30 ENCOUNTER — Other Ambulatory Visit: Payer: Self-pay

## 2019-06-30 ENCOUNTER — Encounter: Payer: Self-pay | Admitting: Physical Therapy

## 2019-06-30 ENCOUNTER — Ambulatory Visit: Payer: Medicare Other | Attending: Podiatry | Admitting: Physical Therapy

## 2019-06-30 DIAGNOSIS — R262 Difficulty in walking, not elsewhere classified: Secondary | ICD-10-CM

## 2019-06-30 DIAGNOSIS — M6281 Muscle weakness (generalized): Secondary | ICD-10-CM | POA: Insufficient documentation

## 2019-06-30 DIAGNOSIS — R2689 Other abnormalities of gait and mobility: Secondary | ICD-10-CM | POA: Diagnosis not present

## 2019-06-30 NOTE — Therapy (Signed)
[place] MAIN [facility] SERVICES [address] [place], [place], [address] Phone: [phone]   Fax:  [phone]  Physical Therapy Treatment  Patient Details  Name: Wanda Ochoa. [place] MRN: [id number] Date of Birth: Dec 18, 1945 Referring Provider (PT): Lavon Paganini   Encounter Date: 06/30/2019  PT End of Session - 06/30/19 1608    Visit Number  13    Number of Visits  17    Date for PT Re-Evaluation  06/29/19    PT Start Time  1602    PT Stop Time  1640    PT Time Calculation (min)  38 min    Equipment Utilized During Treatment  Gait belt    Activity Tolerance  Patient tolerated treatment well    Behavior During Therapy  WFL for tasks assessed/performed       Past Medical History:  Diagnosis Date  . Anxiety   . Arthritis    right knee  . Asthma   . HTN (hypertension)   . Thyroid disease     Past Surgical History:  Procedure Laterality Date  . ABDOMINAL HYSTERECTOMY  1990  . BLADDER SUSPENSION  1990  . BREAST CYST ASPIRATION Right 1985  . BREAST EXCISIONAL BIOPSY Right 1967   EXCISIONAL BX - NEG  . BREAST SURGERY Right 1967   Lumpectomy  . CESAREAN SECTION    . COLONOSCOPY WITH PROPOFOL N/A 12/18/2018   Procedure: COLONOSCOPY WITH PROPOFOL;  Surgeon: Lollie Sails, MD;  Location: [facility] END[facility]COPY;  Service: Endoscopy;  Laterality: N/A;  . HEMORRHOIDECTOMY WITH HEMORRHOID BANDING  1988    There were no vitals filed for this visit.  Subjective Assessment - 06/30/19 1606    Subjective  Patient reports she has been doing well. She is having some right knee pain.Marland Kitchen Has been compliant with HEP and is going to the [employer]. Reports some R knee pain with NuStep at [facility]. She went this morning and is having 8/10 R knee pain.    Pertinent History  Patient is having some falls and had 4 falls in the last month. She was indoors and she did not get hurt. She does not use an AD.    Limitations  Walking    How long can you  sit comfortably?  unlimited    How long can you stand comfortably?  unlimited    Patient Stated Goals  to not fall    Currently in Pain?  Yes    Pain Score  2     Pain Location  Knee    Pain Orientation  Right    Pain Descriptors / Indicators  Aching    Pain Type  Chronic pain    Pain Onset  More than a month ago    Aggravating Factors   moving a certain way    Pain Relieving Factors  ice    Effect of Pain on Daily Activities  no effect    Multiple Pain Sites  No       Treatment: Octane fitness x 5 mins L 6  Ther-ex  Hip flexion marches with 2# ankle weights x 10 bilateral Hip abduction with 2# x 10 bilateral Hip extension with 2# x 10 bilateral Heel raises x 15 x 2 sets Step ups to 6-inch stool x 20  Leg press 40 lbs x 20 x 3 sets  Patient needs occasional verbal cueing to improve posture and cueing to correctly perform exercises slowly, holding at end of range to increase motor firing  of desired muscle to encourage fatigue.    Neuromuscular Re-education  TM at . 3 miles / hour bwd, side to side x 3 mins each direction Tandem gait on floor  without UE support x 2o feet 1/2 foam roll balance with flat side up 30s x 2 reps 1/2 foam roll balance with flat side down 30s x 2 reps Foam to 6 inch stool tapping x 20  Matrix x 3 reps 22.5 lbs and CGA [place] fwd/bwd, side to side , diagonal x 10 ,cues for posture and stepping strategies, occasional LOB [place] stepping left and right x 10       Pt educated throughout session about proper posture and technique with exercises. Improved exercise technique, movement at target joints, use of target muscles after min to mod verbal, visual, tactile cues. CGA and Min to mod verbal cues used throughout with increased in postural sway and LOB most seen with narrow base of support and while on uneven surfaces. Continues to have balance deficits typical with diagnosis.                     PT Education - 06/30/19 1607     Education Details  HEP    Person(s) Educated  Patient    Methods  Explanation    Comprehension  Verbalized understanding;Returned demonstration;Verbal cues required;Tactile cues required;Need further instruction       PT Short Term Goals - 06/16/19 1629      PT SHORT TERM GOAL #1   Title  Patient will be independent in home exercise program to improve strength/mobility for better functional independence with ADLs.    Period  Weeks    Status  New    Target Date  06/01/19        PT Long Term Goals - 06/16/19 1612      PT LONG TERM GOAL #1   Title  Patient (> 6 years old) will complete five times sit to stand test in < 15 seconds indicating an increased LE strength and improved balance.    Baseline  06/16/19=16.66 sec    Time  8    Period  Weeks    Status  Partially Met    Target Date  06/29/19      PT LONG TERM GOAL #2   Title  Patient will increase Berg Balance score by > 6 points to demonstrate decreased fall risk during functional activities.    Baseline  06/16/19=52/56    Time  8    Period  Weeks    Status  Partially Met    Target Date  06/29/19      PT LONG TERM GOAL #3   Title  Patient will reduce timed up and go to <11 seconds to reduce fall risk and demonstrate improved transfer/gait ability.    Baseline  06/16/19=11.64    Time  8    Period  Weeks    Status  Partially Met    Target Date  06/29/19            Plan - 06/30/19 1609    Clinical Impression Statement  Patient demonstrating increased coordination of muscle activation with smoother movements during strengthening interventions in close chained. Decreased fatigue at end of set implicates increased strength additionally. Patient continues to be challenged by dynamic surfaces and single limb stance. Patient will continue to benefit from skilled physical therapy to improve gait mechanics, strength, and balance for return to PLOF   Personal Factors and  Comorbidities  Age    Stability/Clinical Decision  Making  Stable/Uncomplicated    Rehab Potential  Poor    PT Frequency  2x / week    PT Duration  8 weeks    PT Treatment/Interventions  Patient/family education;Balance training;Neuromuscular re-education;Manual techniques;Gait training;Therapeutic activities;Therapeutic exercise    PT Next Visit Plan  balance and strengthening    Consulted and Agree with Plan of Care  Patient       Patient will benefit from skilled therapeutic intervention in order to improve the following deficits and impairments:  Abnormal gait, Difficulty walking, Decreased strength, Impaired sensation  Visit Diagnosis: Difficulty in walking, not elsewhere classified  Other abnormalities of gait and mobility  Muscle weakness (generalized)     Problem List Patient Active Problem List   Diagnosis Date Noted  . Idiopathic gout involving toe of right foot 12/07/2018  . Effusion of right knee 06/18/2017  . Hematuria 09/11/2016  . Severe recurrent major depression without psychotic features ([place])   . Hypercholesteremia 05/02/2015  . Mitral valve prolapse 02/21/2015  . Allergic rhinitis 12/24/2014  . Osteopenia 12/24/2014  . Hypothyroidism 11/24/2014  . Generalized anxiety disorder 02/24/2012  . Nocturnal hypoxemia 01/20/2012  . Abdominal mass 01/20/2012  . Temporary cerebral vascular dysfunction 01/02/2009  . Benign hypertension 03/28/2008  . Asthma, exogenous 10/22/2007  . Adaptive colitis 03/12/2003    [name], PT DPT 06/30/2019, 4:10 PM  [place] MAIN [facility] SERVICES [address] [place], [place], [address] Phone: [phone]   Fax:  [phone]  Name: [place] Kirchner. [place] MRN: [id number] Date of Birth: 20-Oct-1945

## 2019-07-05 ENCOUNTER — Other Ambulatory Visit: Payer: Self-pay

## 2019-07-05 ENCOUNTER — Encounter: Payer: Self-pay | Admitting: Physical Therapy

## 2019-07-05 ENCOUNTER — Ambulatory Visit: Payer: Medicare Other | Admitting: Physical Therapy

## 2019-07-05 DIAGNOSIS — M6281 Muscle weakness (generalized): Secondary | ICD-10-CM

## 2019-07-05 DIAGNOSIS — R262 Difficulty in walking, not elsewhere classified: Secondary | ICD-10-CM | POA: Diagnosis not present

## 2019-07-05 DIAGNOSIS — R2689 Other abnormalities of gait and mobility: Secondary | ICD-10-CM

## 2019-07-05 NOTE — Therapy (Signed)
[place] MAIN [facility] SERVICES [address] [place], [place], [address] Phone: [phone]   Fax:  [phone]  Physical Therapy Treatment  Patient Details  Name: Wanda Ochoa. Sthilaire MRN: [id number] Date of Birth: 1945-08-27 Referring Provider (PT): Lavon Paganini   Encounter Date: 07/05/2019  PT End of Session - 07/05/19 1603    Visit Number  14    Number of Visits  17    Date for PT Re-Evaluation  06/29/19    PT Start Time  1601    PT Stop Time  1641    PT Time Calculation (min)  40 min    Equipment Utilized During Treatment  Gait belt    Activity Tolerance  Patient tolerated treatment well    Behavior During Therapy  WFL for tasks assessed/performed       Past Medical History:  Diagnosis Date  . Anxiety   . Arthritis    right knee  . Asthma   . HTN (hypertension)   . Thyroid disease     Past Surgical History:  Procedure Laterality Date  . ABDOMINAL HYSTERECTOMY  1990  . BLADDER SUSPENSION  1990  . BREAST CYST ASPIRATION Right 1985  . BREAST EXCISIONAL BIOPSY Right 1967   EXCISIONAL BX - NEG  . BREAST SURGERY Right 1967   Lumpectomy  . CESAREAN SECTION    . COLONOSCOPY WITH PROPOFOL N/A 12/18/2018   Procedure: COLONOSCOPY WITH PROPOFOL;  Surgeon: Lollie Sails, MD;  Location: [facility] ENDOSCOPY;  Service: Endoscopy;  Laterality: N/A;  . HEMORRHOIDECTOMY WITH HEMORRHOID BANDING  1988    There were no vitals filed for this visit.  Subjective Assessment - 07/05/19 1602    Subjective  Patient reports she has been doing well. She is having some right knee pain.Marland Kitchen Has been compliant with HEP and is going to the [employer]. Reports some R knee pain with NuStep at [facility]. She went this morning and is having 8/10 R knee pain.    Pertinent History  Patient is having some falls and had 4 falls in the last month. She was indoors and she did not get hurt. She does not use an AD.    Limitations  Walking    How long can you  sit comfortably?  unlimited    How long can you stand comfortably?  unlimited    Patient Stated Goals  to not fall    Currently in Pain?  Yes    Pain Score  2     Pain Location  Knee    Pain Orientation  Right    Pain Descriptors / Indicators  Aching    Pain Onset  More than a month ago     Treatment: [employer] fitness L 4  TM side stepping . 4 miles / hour x 3 mins left and right    Neuromuscular Re-education  Rocker board fwd/bwd, side to side x 20 each direction Tandem gait on 2"x4" without UE support x 2 lengths Side stepping on 2"x4" without UE support x 2 lengths Heel/toe raises without UE support 3s hold x 10 each 1/2 foam roll balance with flat side up 30s x 2 reps 1/2 foam roll balance with flat side down 30s x 2 reps 1/2 foam roll tandem balance alternating forward LE 30s x 2 each LE forward Lateral side steps from foam to 6 inch stool left and right x 15 Backwards stepping from foam to 6 inch stool x 15  Pt educated throughout session about proper posture and technique with exercises. Improved exercise technique, movement at target joints, use of target muscles after min to mod verbal, visual, tactile cues. CGA and Min to mod verbal cues used throughout with increased in postural sway and LOB most seen with narrow base of support and while on uneven surfaces.                        PT Education - 07/05/19 1602    Education Details  HEP    Person(s) Educated  Patient    Methods  Explanation    Comprehension  Verbalized understanding;Returned demonstration;Tactile cues required;Need further instruction       PT Short Term Goals - 06/16/19 1629      PT SHORT TERM GOAL #1   Title  Patient will be independent in home exercise program to improve strength/mobility for better functional independence with ADLs.    Period  Weeks    Status  New    Target Date  06/01/19        PT Long Term Goals - 06/16/19 1612      PT LONG TERM GOAL #1    Title  Patient (74 years old) will complete five times sit to stand test in < 15 seconds indicating an increased LE strength and improved balance.    Baseline  06/16/19=16.66 sec    Time  8    Period  Weeks    Status  Partially Met    Target Date  06/29/19      PT LONG TERM GOAL #2   Title  Patient will increase Berg Balance score by > 6 points to demonstrate decreased fall risk during functional activities.    Baseline  06/16/19=52/56    Time  8    Period  Weeks    Status  Partially Met    Target Date  06/29/19      PT LONG TERM GOAL #3   Title  Patient will reduce timed up and go to <11 seconds to reduce fall risk and demonstrate improved transfer/gait ability.    Baseline  06/16/19=11.64    Time  8    Period  Weeks    Status  Partially Met    Target Date  06/29/19            Plan - 07/05/19 1604    Clinical Impression Statement  Patient demonstrates deficits with postural control in tandem and narrow stance on purple foam. Patient demonstrated hesitation with full weight shifting and on uneven surfaces but minimal cueing resulted in good technique and no LOB.  Patient improved ability to challenge dynamic balance with supervision and with UE assist today.   Patient will continue to benefit from skilled physical therapy to improve endurance and dynamic balance to reduce fall risk    Personal Factors and Comorbidities  Age    Stability/Clinical Decision Making  Stable/Uncomplicated    Rehab Potential  Poor    PT Frequency  2x / week    PT Duration  8 weeks    PT Treatment/Interventions  Patient/family education;Balance training;Neuromuscular re-education;Manual techniques;Gait training;Therapeutic activities;Therapeutic exercise    PT Next Visit Plan  balance and strengthening    Consulted and Agree with Plan of Care  Patient       Patient will benefit from skilled therapeutic intervention in order to improve the following deficits and impairments:  Abnormal gait,  Difficulty walking, Decreased strength, Impaired sensation  Visit Diagnosis: Difficulty in walking, not elsewhere classified  Other abnormalities of gait and mobility  Muscle weakness (generalized)     Problem List Patient Active Problem List   Diagnosis Date Noted  . Idiopathic gout involving toe of right foot 12/07/2018  . Effusion of right knee 06/18/2017  . Hematuria 09/11/2016  . Severe recurrent major depression without psychotic features ([place])   . Hypercholesteremia 05/02/2015  . Mitral valve prolapse 02/21/2015  . Allergic rhinitis 12/24/2014  . Osteopenia 12/24/2014  . Hypothyroidism 11/24/2014  . Generalized anxiety disorder 02/24/2012  . Nocturnal hypoxemia 01/20/2012  . Abdominal mass 01/20/2012  . Temporary cerebral vascular dysfunction 01/02/2009  . Benign hypertension 03/28/2008  . Asthma, exogenous 10/22/2007  . Adaptive colitis 03/12/2003    [name] , PT DPT 07/05/2019, 4:07 PM  [place] MAIN [facility] SERVICES [address] [place], [place], [address] Phone: [phone]   Fax:  [phone]  Name: Caron Ode. Simko MRN: [id number] Date of Birth: 11-Mar-1946

## 2019-07-07 ENCOUNTER — Ambulatory Visit: Payer: Medicare Other | Admitting: Physical Therapy

## 2019-07-07 ENCOUNTER — Other Ambulatory Visit: Payer: Self-pay

## 2019-07-07 ENCOUNTER — Encounter: Payer: Self-pay | Admitting: Physical Therapy

## 2019-07-07 DIAGNOSIS — R2689 Other abnormalities of gait and mobility: Secondary | ICD-10-CM

## 2019-07-07 DIAGNOSIS — R262 Difficulty in walking, not elsewhere classified: Secondary | ICD-10-CM

## 2019-07-07 DIAGNOSIS — M6281 Muscle weakness (generalized): Secondary | ICD-10-CM | POA: Diagnosis not present

## 2019-07-07 NOTE — Therapy (Signed)
[place] MAIN [facility] SERVICES [address] [place], [place], [address] Phone: [phone]   Fax:  [phone]  Physical Therapy Treatment  Patient Details  Name: Wanda Ochoa MRN: [id number] Date of Birth: [birth year] Referring Provider (PT): Lavon Paganini   Encounter Date: 07/07/2019  PT End of Session - 07/07/19 1610    Visit Number  15    Number of Visits  17    Date for PT Re-Evaluation  06/29/19    PT Start Time  1601    PT Stop Time  1642    PT Time Calculation (min)  41 min    Equipment Utilized During Treatment  Gait belt    Activity Tolerance  Patient tolerated treatment well    Behavior During Therapy  WFL for tasks assessed/performed       Past Medical History:  Diagnosis Date  . Anxiety   . Arthritis    right knee  . Asthma   . HTN (hypertension)   . Thyroid disease     Past Surgical History:  Procedure Laterality Date  . ABDOMINAL HYSTERECTOMY  1990  . BLADDER SUSPENSION  1990  . BREAST CYST ASPIRATION Right 1985  . BREAST EXCISIONAL BIOPSY Right 1967   EXCISIONAL BX - NEG  . BREAST SURGERY Right 1967   Lumpectomy  . CESAREAN SECTION    . COLONOSCOPY WITH PROPOFOL N/A 12/18/2018   Procedure: COLONOSCOPY WITH PROPOFOL;  Surgeon: Lollie Sails, MD;  Location: [facility] ENDOSCOPY;  Service: Endoscopy;  Laterality: N/A;  . HEMORRHOIDECTOMY WITH HEMORRHOID BANDING  1988    There were no vitals filed for this visit.  Subjective Assessment - 07/07/19 1603    Subjective  Patient reports she has been doing well. She is having some right knee pain.Marland Kitchen Has been compliant with HEP and is going to the [employer]. Reports some R knee pain with [facility]Step at [facility]. She went this morning and is having 8/10 R knee pain.    Pertinent History  Patient is having some falls and had 4 falls in the last month. She was indoors and she did not get hurt. She does not use an AD.    Limitations  Walking    How long can you  sit comfortably?  unlimited    How long can you stand comfortably?  unlimited    Patient Stated Goals  to not fall    Currently in Pain?  No/denies    Pain Score  0-No pain    Pain Onset  More than a month ago       Treatment: Treatment: Neuromuscular Training: Stool: staggered stance, head turns side/side, up/down x 5 reps each, each foot in front; VCs for proper technique and positioning for each exercise staggered stance, trunk rotation with 2 lb rod, VC to keep UE straight and turn head with trunk  Hurdle: Forward and backward stepping over hurdle x15 each direction, VCs to take big enough steps and to try to increase speed to work on coordination  Side stepping over hurdle 15x each direction  Blue Foam: Side stepping x10 on blue balance CGA for safety, VCs for taking a big enough step  Airex pad trunk rotation x2 min, CGA for safety, demonstrated difficulty with keeping arms extended and full rotation with head turn Airex pad, balloon tapping to mirror x2 min, supervision for safety with varying directions and speed of balloon, VCs for utilizing both hands and minimizing UE support  TM side stepping at .  4 miles / hour x 3 mins left sidestepping and 3 mins right side stepping elevation 2  Floor Star exercise: Performed stepping on the star diagram on the floor. Working on weight-shifting forward onto the foot that stepped forward and then weight-shifting back and bringing her feet back together with CGA. Performed 2 reps each foot.   Matrix: Fwd/bwd gait with 22. 5 lbs and CGA, cues for posture and stepping strategies, occasional LOB Side stepping left and right and CGA with cues to slow movement  Pt educated throughout session about proper posture and technique with exercises. Improved exercise technique, movement at target joints, use of target muscles after min to mod verbal, visual, tactile cues. CGA and Min to mod verbal cues used throughout with increased in postural  sway and LOB most seen with narrow base of support and while on uneven surfaces                       PT Education - 07/07/19 1604    Education Details  HEP    Person(s) Educated  Patient    Methods  Explanation    Comprehension  Verbalized understanding;Returned demonstration       PT Short Term Goals - 06/16/19 1629      PT SHORT TERM GOAL #1   Title  Patient will be independent in home exercise program to improve strength/mobility for better functional independence with ADLs.    Period  Weeks    Status  New    Target Date  06/01/19        PT Long Term Goals - 06/16/19 1612      PT LONG TERM GOAL #1   Title  Patient (> 89 years old) will complete five times sit to stand test in < 15 seconds indicating an increased LE strength and improved balance.    Baseline  06/16/19=16.66 sec    Time  8    Period  Weeks    Status  Partially Met    Target Date  06/29/19      PT LONG TERM GOAL #2   Title  Patient will increase Berg Balance score by > 6 points to demonstrate decreased fall risk during functional activities.    Baseline  06/16/19=52/56    Time  8    Period  Weeks    Status  Partially Met    Target Date  06/29/19      PT LONG TERM GOAL #3   Title  Patient will reduce timed up and go to <11 seconds to reduce fall risk and demonstrate improved transfer/gait ability.    Baseline  06/16/19=11.64    Time  8    Period  Weeks    Status  Partially Met    Target Date  06/29/19            Plan - 07/07/19 1612    Clinical Impression Statement  Pt presents with unsteadiness on uneven surfaces and fatigues with therapeutic exercises. Patient needs assist with instruction for balance with side stepping on uneven surfaces and needs CGA assist with balance activities. Patient demonstrates difficulty with side stepping and navigating small spaces with decreased base of support and increased challenges for LE.  Patient tolerated all interventions well this date  and will benefit from continued skilled PT interventions to improve strength and balance and decrease risk of falling   Personal Factors and Comorbidities  Age    Stability/Clinical Decision Making  Stable/Uncomplicated  Rehab Potential  Poor    PT Frequency  2x / week    PT Duration  8 weeks    PT Treatment/Interventions  Patient/family education;Balance training;Neuromuscular re-education;Manual techniques;Gait training;Therapeutic activities;Therapeutic exercise    PT Next Visit Plan  balance and strengthening    Consulted and Agree with Plan of Care  Patient       Patient will benefit from skilled therapeutic intervention in order to improve the following deficits and impairments:  Abnormal gait, Difficulty walking, Decreased strength, Impaired sensation  Visit Diagnosis: Difficulty in walking, not elsewhere classified  Other abnormalities of gait and mobility  Muscle weakness (generalized)     Problem List Patient Active Problem List   Diagnosis Date Noted  . Idiopathic gout involving toe of right foot 12/07/2018  . Effusion of right knee 06/18/2017  . Hematuria 09/11/2016  . Severe recurrent major depression without psychotic features ([place])   . Hypercholesteremia 05/02/2015  . Mitral valve prolapse 02/21/2015  . Allergic rhinitis 12/24/2014  . Osteopenia 12/24/2014  . Hypothyroidism 11/24/2014  . Generalized anxiety disorder 02/24/2012  . Nocturnal hypoxemia 01/20/2012  . Abdominal mass 01/20/2012  . Temporary cerebral vascular dysfunction 01/02/2009  . Benign hypertension 03/28/2008  . Asthma, exogenous 10/22/2007  . Adaptive colitis 03/12/2003    [name], [place] DPT 07/07/2019, 4:30 PM  [place] MAIN [facility] SERVICES [address] [place], [place], [address] Phone: [phone]   Fax:  [phone]  Name: Wanda Ochoa MRN: [id number] Date of Birth: 07-30-1945

## 2019-07-12 ENCOUNTER — Ambulatory Visit: Payer: Medicare Other | Admitting: Physical Therapy

## 2019-07-12 ENCOUNTER — Other Ambulatory Visit: Payer: Self-pay

## 2019-07-12 DIAGNOSIS — R2689 Other abnormalities of gait and mobility: Secondary | ICD-10-CM

## 2019-07-12 DIAGNOSIS — R262 Difficulty in walking, not elsewhere classified: Secondary | ICD-10-CM

## 2019-07-12 DIAGNOSIS — M6281 Muscle weakness (generalized): Secondary | ICD-10-CM

## 2019-07-13 NOTE — Therapy (Signed)
[place] MAIN [facility] SERVICES [address] [place], [place], [address] Phone: [phone]   Fax:  [phone]  Physical Therapy Treatment  Patient Details  Name: [name]. Henckel MRN: [id number] Date of Birth: July 16, 1945 Referring Provider (PT): Lavon Paganini   Encounter Date: 07/12/2019    Past Medical History:  Diagnosis Date  . Anxiety   . Arthritis    right knee  . Asthma   . HTN (hypertension)   . Thyroid disease     Past Surgical History:  Procedure Laterality Date  . ABDOMINAL HYSTERECTOMY  1990  . BLADDER SUSPENSION  1990  . BREAST CYST ASPIRATION Right 1985  . BREAST EXCISIONAL BIOPSY Right 1967   EXCISIONAL BX - NEG  . BREAST SURGERY Right 1967   Lumpectomy  . CESAREAN SECTION    . COLONOSCOPY WITH PROPOFOL N/A 12/18/2018   Procedure: COLONOSCOPY WITH PROPOFOL;  Surgeon: Lollie Sails, MD;  Location: [facility] ENDOSCOPY;  Service: Endoscopy;  Laterality: N/A;  . HEMORRHOIDECTOMY WITH HEMORRHOID BANDING  1988    There were no vitals filed for this visit.   Patient arrived but had a family emergency right as she entered the PT treatment area and needed to leave.                            PT Short Term Goals - 06/16/19 1629      PT SHORT TERM GOAL #1   Title  Patient will be independent in home exercise program to improve strength/mobility for better functional independence with ADLs.    Period  Weeks    Status  New    Target Date  06/01/19        PT Long Term Goals - 06/16/19 1612      PT LONG TERM GOAL #1   Title  Patient (> 74 years old) will complete five times sit to stand test in < 15 seconds indicating an increased LE strength and improved balance.    Baseline  06/16/19=16.66 sec    Time  8    Period  Weeks    Status  Partially Met    Target Date  06/29/19      PT LONG TERM GOAL #2   Title  Patient will increase Berg Balance score by > 6 points to demonstrate  decreased fall risk during functional activities.    Baseline  06/16/19=52/56    Time  8    Period  Weeks    Status  Partially Met    Target Date  06/29/19      PT LONG TERM GOAL #3   Title  Patient will reduce timed up and go to <11 seconds to reduce fall risk and demonstrate improved transfer/gait ability.    Baseline  06/16/19=11.64    Time  8    Period  Weeks    Status  Partially Met    Target Date  06/29/19              Patient will benefit from skilled therapeutic intervention in order to improve the following deficits and impairments:     Visit Diagnosis: Other abnormalities of gait and mobility  Difficulty in walking, not elsewhere classified  Muscle weakness (generalized)     Problem List Patient Active Problem List   Diagnosis Date Noted  . Idiopathic gout involving toe of right foot 12/07/2018  . Effusion of right knee 06/18/2017  . Hematuria  09/11/2016  . Severe recurrent major depression without psychotic features ([place])   . Hypercholesteremia 05/02/2015  . Mitral valve prolapse 02/21/2015  . Allergic rhinitis 12/24/2014  . Osteopenia 12/24/2014  . Hypothyroidism 11/24/2014  . Generalized anxiety disorder 02/24/2012  . Nocturnal hypoxemia 01/20/2012  . Abdominal mass 01/20/2012  . Temporary cerebral vascular dysfunction 01/02/2009  . Benign hypertension 03/28/2008  . Asthma, exogenous 10/22/2007  . Adaptive colitis 03/12/2003    [name], PT DPT 07/13/2019, 9:06 AM  [place] MAIN [facility] SERVICES [address] [place], [place], [address] Phone: [phone]   Fax:  [phone]  Name: [name] MRN: [id number] Date of Birth: Aug 09, 1945

## 2019-07-14 ENCOUNTER — Other Ambulatory Visit: Payer: Self-pay

## 2019-07-14 ENCOUNTER — Ambulatory Visit: Payer: Medicare Other | Admitting: Physical Therapy

## 2019-07-14 ENCOUNTER — Encounter: Payer: Self-pay | Admitting: Physical Therapy

## 2019-07-14 DIAGNOSIS — R2689 Other abnormalities of gait and mobility: Secondary | ICD-10-CM | POA: Diagnosis not present

## 2019-07-14 DIAGNOSIS — M6281 Muscle weakness (generalized): Secondary | ICD-10-CM | POA: Diagnosis not present

## 2019-07-14 DIAGNOSIS — R262 Difficulty in walking, not elsewhere classified: Secondary | ICD-10-CM

## 2019-07-14 NOTE — Therapy (Signed)
[place] MAIN [facility] SERVICES [address] [place], [place], [address] Phone: [phone]   Fax:  [phone]  Physical Therapy Treatment  Patient Details  Name: Wanda Ochoa MRN: [id number] Date of Birth: February 16, 1946 Referring Provider (PT): Lavon Paganini   Encounter Date: 07/14/2019  PT End of Session - 07/14/19 1608    Visit Number  16    Number of Visits  17    Date for PT Re-Evaluation  06/29/19    PT Start Time  1601    PT Stop Time  1640    PT Time Calculation (min)  39 min    Equipment Utilized During Treatment  Gait belt    Activity Tolerance  Patient tolerated treatment well    Behavior During Therapy  WFL for tasks assessed/performed       Past Medical History:  Diagnosis Date  . Anxiety   . Arthritis    right knee  . Asthma   . HTN (hypertension)   . Thyroid disease     Past Surgical History:  Procedure Laterality Date  . ABDOMINAL HYSTERECTOMY  1990  . BLADDER SUSPENSION  1990  . BREAST CYST ASPIRATION Right 1985  . BREAST EXCISIONAL BIOPSY Right 1967   EXCISIONAL BX - NEG  . BREAST SURGERY Right 1967   Lumpectomy  . CESAREAN SECTION    . COLONOSCOPY WITH PROPOFOL N/A 12/18/2018   Procedure: COLONOSCOPY WITH PROPOFOL;  Surgeon: Lollie Sails, MD;  Location: [facility] ENDOSCOPY;  Service: Endoscopy;  Laterality: N/A;  . HEMORRHOIDECTOMY WITH HEMORRHOID BANDING  1988    There were no vitals filed for this visit.  Subjective Assessment - 07/14/19 1607    Subjective  Patient reports she has been doing well. She is having some right knee pain.Marland Kitchen Has been compliant with HEP and is going to the [employer]. Reports some R knee pain with Nu[facility] at [facility]. She went this morning and is having 8/10 R knee pain.    Pertinent History  Patient is having some falls and had 4 falls in the last month. She was indoors and she did not get hurt. She does not use an AD.    Limitations  Walking    How long can you  sit comfortably?  unlimited    How long can you stand comfortably?  unlimited    Patient Stated Goals  to not fall    Currently in Pain?  Yes    Pain Score  2     Pain Location  Knee    Pain Orientation  Right    Pain Descriptors / Indicators  Aching    Pain Onset  More than a month ago       Treatment: Neuromuscular Training: Stool: staggered stance, and ball toss x 5 reps each, each foot in front; VCs for proper technique and positioning for each exercise staggered stance, trunk rotation with 2 lb rod, VC to keep UE straight and turn head with trunk   Blue Foam: Side stepping x10 on blue balance CGA for safety, VCs for taking a big enough step  Airex pad trunk rotation x2 min, CGA for safety, demonstrated difficulty with keeping arms extended and full rotation with head turn  BOSU: Lunge to BOSU ball x 10 , cues for going slow and to control the speed  Matrix: Fwd/bwd gait with 22. 5 lbs and CGA, cues for posture and stepping strategies, occasional LOB Side stepping left and right and CGA with cues  to slow movement  Leg press  40 lbs x 20 x 3     Pt educated throughout session about proper posture and technique with exercises. Improved exercise technique, movement at target joints, use of target muscles after min to mod verbal, visual, tactile cues. CGA and Min to mod verbal cues used throughout with increased in postural sway and LOB most seen with narrow base of support and while on uneven surfaces.                     PT Education - 07/14/19 1607    Education Details  HEP    Person(s) Educated  Patient    Methods  Explanation    Comprehension  Verbalized understanding       PT Short Term Goals - 06/16/19 1629      PT SHORT TERM GOAL #1   Title  Patient will be independent in home exercise program to improve strength/mobility for better functional independence with ADLs.    Period  Weeks    Status  New    Target Date  06/01/19        PT  Long Term Goals - 06/16/19 1612      PT LONG TERM GOAL #1   Title  Patient (> 73 years old) will complete five times sit to stand test in < 15 seconds indicating an increased LE strength and improved balance.    Baseline  06/16/19=16.66 sec    Time  8    Period  Weeks    Status  Partially Met    Target Date  06/29/19      PT LONG TERM GOAL #2   Title  Patient will increase Berg Balance score by > 6 points to demonstrate decreased fall risk during functional activities.    Baseline  06/16/19=52/56    Time  8    Period  Weeks    Status  Partially Met    Target Date  06/29/19      PT LONG TERM GOAL #3   Title  Patient will reduce timed up and go to <11 seconds to reduce fall risk and demonstrate improved transfer/gait ability.    Baseline  06/16/19=11.64    Time  8    Period  Weeks    Status  Partially Met    Target Date  06/29/19            Plan - 07/14/19 1608    Clinical Impression Statement  Patient demonstrates deficits with postural control in tandem and narrow stance on purple foam with UE balance challenges. Patient has better LE control with lunge on BOSU ball.   Patient improved ability to challenge dynamic balance with supervision and without UE assist today.  Patient shows good BLE strength and hip control during DL leg press. Patient will continue to benefit from skilled physical therapy to improve endurance and dynamic balance to reduce fall risk.    Personal Factors and Comorbidities  Age    Stability/Clinical Decision Making  Stable/Uncomplicated    Rehab Potential  Poor    PT Frequency  2x / week    PT Duration  8 weeks    PT Treatment/Interventions  Patient/family education;Balance training;Neuromuscular re-education;Manual techniques;Gait training;Therapeutic activities;Therapeutic exercise    PT Next Visit Plan  balance and strengthening    Consulted and Agree with Plan of Care  Patient       Patient will benefit from skilled therapeutic intervention in  order to improve  the following deficits and impairments:  Abnormal gait, Difficulty walking, Decreased strength, Impaired sensation  Visit Diagnosis: Difficulty in walking, not elsewhere classified  Other abnormalities of gait and mobility  Muscle weakness (generalized)     Problem List Patient Active Problem List   Diagnosis Date Noted  . Idiopathic gout involving toe of right foot 12/07/2018  . Effusion of right knee 06/18/2017  . Hematuria 09/11/2016  . Severe recurrent major depression without psychotic features ([place])   . Hypercholesteremia 05/02/2015  . Mitral valve prolapse 02/21/2015  . Allergic rhinitis 12/24/2014  . Osteopenia 12/24/2014  . Hypothyroidism 11/24/2014  . Generalized anxiety disorder 02/24/2012  . Nocturnal hypoxemia 01/20/2012  . Abdominal mass 01/20/2012  . Temporary cerebral vascular dysfunction 01/02/2009  . Benign hypertension 03/28/2008  . Asthma, exogenous 10/22/2007  . Adaptive colitis 03/12/2003    [name], PT DPT 07/14/2019, 4:09 PM  [place] MAIN [facility] SERVICES [address] [place], [place], [address] Phone: [phone]   Fax:  [phone]  Name: Wanda Ochoa MRN: [id number] Date of Birth: [birth year]

## 2019-07-19 ENCOUNTER — Ambulatory Visit: Payer: Medicare Other | Admitting: Physical Therapy

## 2019-07-19 ENCOUNTER — Encounter: Payer: Self-pay | Admitting: Physical Therapy

## 2019-07-19 ENCOUNTER — Other Ambulatory Visit: Payer: Self-pay

## 2019-07-19 DIAGNOSIS — R262 Difficulty in walking, not elsewhere classified: Secondary | ICD-10-CM | POA: Diagnosis not present

## 2019-07-19 DIAGNOSIS — M6281 Muscle weakness (generalized): Secondary | ICD-10-CM

## 2019-07-19 DIAGNOSIS — R2689 Other abnormalities of gait and mobility: Secondary | ICD-10-CM

## 2019-07-19 NOTE — Therapy (Signed)
[place] MAIN [facility] SERVICES [address] [place], [place], [address] Phone: [phone]   Fax:  [phone]  Physical Therapy Treatment  Patient Details  Name: Wanda Ochoa MRN: [id number] Date of Birth: 1945/07/28 Referring Provider (PT): Lavon Paganini   Encounter Date: 07/19/2019    Past Medical History:  Diagnosis Date  . Anxiety   . Arthritis    right knee  . Asthma   . HTN (hypertension)   . Thyroid disease     Past Surgical History:  Procedure Laterality Date  . ABDOMINAL HYSTERECTOMY  1990  . BLADDER SUSPENSION  1990  . BREAST CYST ASPIRATION Right 1985  . BREAST EXCISIONAL BIOPSY Right 1967   EXCISIONAL BX - NEG  . BREAST SURGERY Right 1967   Lumpectomy  . CESAREAN SECTION    . COLONOSCOPY WITH PROPOFOL N/A 12/18/2018   Procedure: COLONOSCOPY WITH PROPOFOL;  Surgeon: Lollie Sails, MD;  Location: [facility] ENDOSCOPY;  Service: Endoscopy;  Laterality: N/A;  . HEMORRHOIDECTOMY WITH HEMORRHOID BANDING  1988    There were no vitals filed for this visit.    Neuromuscular Re-education  Hurdle  fwd/bwd, side to side x 20 each direction Tandem on 1/2 foam and lateral tapes to stepping stones without UE support x 20 standing on foam and fwd tapes to stepping stones without UE support x 20 Side stepping on foam without UE support x 2 lengths Heel/toe raises without UE support 3s hold x 10 each 1/2 foam roll balance with flat side up 30s x 2 reps 1/2 foam roll balance with flat side down 30s x 2 reps Lateral side steps from foam to 6 inch stool left and right x 15 Backwards stepping from foam to 6 inch stool x 15  Patient performed with instruction, verbal cues, tactile cues of therapist: goal: increase tissue extensibility, promote proper posture, improve mobility                          PT Short Term Goals - 06/16/19 1629      PT SHORT TERM GOAL #1   Title  Patient will be  independent in home exercise program to improve strength/mobility for better functional independence with ADLs.    Period  Weeks    Status  New    Target Date  06/01/19        PT Long Term Goals - 07/21/19 1045      PT LONG TERM GOAL #1   Title  Patient (> 30 years old) will complete five times sit to stand test in < 15 seconds indicating an increased LE strength and improved balance.    Baseline  06/16/19=16.66 sec    Time  8    Period  Weeks    Status  Partially Met    Target Date  08/24/19      PT LONG TERM GOAL #2   Title  Patient will increase Berg Balance score by > 6 points to demonstrate decreased fall risk during functional activities.    Baseline  06/16/19=52/56    Time  8    Period  Weeks    Status  Partially Met    Target Date  08/24/19      PT LONG TERM GOAL #3   Title  Patient will reduce timed up and go to <11 seconds to reduce fall risk and demonstrate improved transfer/gait ability.    Baseline  06/16/19=11.64  Time  8    Period  Weeks    Status  Partially Met    Target Date  08/24/19            Plan - 07/19/19 1613    Clinical Impression Statement  Pt presents with unsteadiness on uneven surfaces and fatigues with therapeutic exercises.  Patient tolerated all interventions well this date and will benefit from continued skilled PT interventions to improve strength and balance and decrease risk of falling.   Personal Factors and Comorbidities  Age    Stability/Clinical Decision Making  Stable/Uncomplicated    Rehab Potential  Poor    PT Frequency  2x / week    PT Duration  8 weeks    PT Treatment/Interventions  Patient/family education;Balance training;Neuromuscular re-education;Manual techniques;Gait training;Therapeutic activities;Therapeutic exercise    PT Next Visit Plan  balance and strengthening    Consulted and Agree with Plan of Care  Patient       Patient will benefit from skilled therapeutic intervention in order to improve the following  deficits and impairments:  Abnormal gait, Difficulty walking, Decreased strength, Impaired sensation  Visit Diagnosis: Difficulty in walking, not elsewhere classified  Other abnormalities of gait and mobility  Muscle weakness (generalized)     Problem List Patient Active Problem List   Diagnosis Date Noted  . Idiopathic gout involving toe of right foot 12/07/2018  . Effusion of right knee 06/18/2017  . Hematuria 09/11/2016  . Severe recurrent major depression without psychotic features ([place])   . Hypercholesteremia 05/02/2015  . Mitral valve prolapse 02/21/2015  . Allergic rhinitis 12/24/2014  . Osteopenia 12/24/2014  . Hypothyroidism 11/24/2014  . Generalized anxiety disorder 02/24/2012  . Nocturnal hypoxemia 01/20/2012  . Abdominal mass 01/20/2012  . Temporary cerebral vascular dysfunction 01/02/2009  . Benign hypertension 03/28/2008  . Asthma, exogenous 10/22/2007  . Adaptive colitis 03/12/2003    [name], PT DPT 07/21/2019, 10:45 AM  [place] MAIN [facility] SERVICES [address] [place], [place], [address] Phone: [phone]   Fax:  [phone]  Name: Wanda Ochoa MRN: [id number] Date of Birth: 11/05/1945

## 2019-07-21 ENCOUNTER — Encounter: Payer: Self-pay | Admitting: Physical Therapy

## 2019-07-21 ENCOUNTER — Other Ambulatory Visit: Payer: Self-pay

## 2019-07-21 ENCOUNTER — Ambulatory Visit: Payer: Medicare Other | Admitting: Physical Therapy

## 2019-07-21 DIAGNOSIS — M6281 Muscle weakness (generalized): Secondary | ICD-10-CM | POA: Diagnosis not present

## 2019-07-21 DIAGNOSIS — R2689 Other abnormalities of gait and mobility: Secondary | ICD-10-CM | POA: Diagnosis not present

## 2019-07-21 DIAGNOSIS — R262 Difficulty in walking, not elsewhere classified: Secondary | ICD-10-CM

## 2019-07-21 NOTE — Therapy (Addendum)
[place] MAIN [facility] SERVICES [address] [place], [place], [address] Phone: [phone]   Fax:  [phone]  Physical Therapy Treatment  Patient Details  Name: Wanda Ochoa MRN: [id number] Date of Birth: May 10, 1945 Referring Provider (PT): Lavon Paganini   Encounter Date: 07/21/2019  PT End of Session - 07/21/19 1603    Visit Number  18    Number of Visits  17    Date for PT Re-Evaluation  08/24/19    PT Start Time  1600    PT Stop Time  1640    PT Time Calculation (min)  40 min    Equipment Utilized During Treatment  Gait belt    Activity Tolerance  Patient tolerated treatment well    Behavior During Therapy  WFL for tasks assessed/performed       Past Medical History:  Diagnosis Date  . Anxiety   . Arthritis    right knee  . Asthma   . HTN (hypertension)   . Thyroid disease     Past Surgical History:  Procedure Laterality Date  . ABDOMINAL HYSTERECTOMY  1990  . BLADDER SUSPENSION  1990  . BREAST CYST ASPIRATION Right 1985  . BREAST EXCISIONAL BIOPSY Right 1967   EXCISIONAL BX - NEG  . BREAST SURGERY Right 1967   Lumpectomy  . CESAREAN SECTION    . COLONOSCOPY WITH PROPOFOL N/A 12/18/2018   Procedure: COLONOSCOPY WITH PROPOFOL;  Surgeon: Lollie Sails, MD;  Location: [facility] ENDOSCOPY;  Service: Endoscopy;  Laterality: N/A;  . HEMORRHOIDECTOMY WITH HEMORRHOID BANDING  1988    There were no vitals filed for this visit.  Subjective Assessment - 07/21/19 1619    Subjective  Pateint reports that she fell yesterday in her yard. She did not get hurt.    Pertinent History  Patient is having some falls and had 4 falls in the last month. She was indoors and she did not get hurt. She does not use an AD.    Limitations  Walking    How long can you sit comfortably?  unlimited    How long can you stand comfortably?  unlimited    Patient Stated Goals  to not fall    Currently in Pain?  No/denies    Pain Score   0-No pain         Neuromuscular Re-education  Hurdle  fwd/bwd, side to side x 20 each direction Tandem on 1/2 foam and lateral tapes to stepping stones without UE support x 20 standing on foam and fwd tapes to stepping stones without UE support x 20 Side stepping on foam without UE support x 2 lengths Heel/toe raises without UE support 3s hold x 10 each 1/2 foam roll balance with flat side up 30s x 2 reps 1/2 foam roll balance with flat side down 30s x 2 reps Lateral side steps from foam to 6 inch stool left and right x 15 Backwards stepping from foam to 6 inch stool x 15    Patient performed with instruction, verbal cues, tactile cues of therapist: goal: increase tissue extensibility, promote proper posture, improve mobility                     PT Education - 07/21/19 1603    Education Details  HEP    Person(s) Educated  Patient    Methods  Explanation    Comprehension  Verbalized understanding       PT Short Term Goals -  06/16/19 1629      PT SHORT TERM GOAL #1   Title  Patient will be independent in home exercise program to improve strength/mobility for better functional independence with ADLs.    Period  Weeks    Status  New    Target Date  06/01/19        PT Long Term Goals - 07/21/19 1045      PT LONG TERM GOAL #1   Title  Patient (> 21 years old) will complete five times sit to stand test in < 15 seconds indicating an increased LE strength and improved balance.    Baseline  06/16/19=16.66 sec    Time  8    Period  Weeks    Status  Partially Met    Target Date  08/24/19      PT LONG TERM GOAL #2   Title  Patient will increase Berg Balance score by > 6 points to demonstrate decreased fall risk during functional activities.    Baseline  06/16/19=52/56    Time  8    Period  Weeks    Status  Partially Met    Target Date  08/24/19      PT LONG TERM GOAL #3   Title  Patient will reduce timed up and go to <11 seconds to reduce fall risk and  demonstrate improved transfer/gait ability.    Baseline  06/16/19=11.64    Time  8    Period  Weeks    Status  Partially Met    Target Date  08/24/19            Plan - 07/21/19 1604    Clinical Impression Statement  Patient instructed in intermediate strengthening and balance exercise.  Patient requires min Vcs for correct exercise technique including to improve LE control with standing exercise. Patient demonstrates better LE control. Patient would benefit from additional skilled PT intervention to improve balance/gait safety and reduce fall risk.   Personal Factors and Comorbidities  Age    Stability/Clinical Decision Making  Stable/Uncomplicated    Rehab Potential  Poor    PT Frequency  2x / week    PT Duration  8 weeks    PT Treatment/Interventions  Patient/family education;Balance training;Neuromuscular re-education;Manual techniques;Gait training;Therapeutic activities;Therapeutic exercise    PT Next Visit Plan  balance and strengthening    Consulted and Agree with Plan of Care  Patient       Patient will benefit from skilled therapeutic intervention in order to improve the following deficits and impairments:  Abnormal gait, Difficulty walking, Decreased strength, Impaired sensation  Visit Diagnosis: Difficulty in walking, not elsewhere classified  Other abnormalities of gait and mobility  Muscle weakness (generalized)     Problem List Patient Active Problem List   Diagnosis Date Noted  . Idiopathic gout involving toe of right foot 12/07/2018  . Effusion of right knee 06/18/2017  . Hematuria 09/11/2016  . Severe recurrent major depression without psychotic features ([place])   . Hypercholesteremia 05/02/2015  . Mitral valve prolapse 02/21/2015  . Allergic rhinitis 12/24/2014  . Osteopenia 12/24/2014  . Hypothyroidism 11/24/2014  . Generalized anxiety disorder 02/24/2012  . Nocturnal hypoxemia 01/20/2012  . Abdominal mass 01/20/2012  . Temporary cerebral vascular  dysfunction 01/02/2009  . Benign hypertension 03/28/2008  . Asthma, exogenous 10/22/2007  . Adaptive colitis 03/12/2003    [name], PT DPT 07/21/2019, 4:22 PM  [place] MAIN [facility] SERVICES [place], [place] Phone: [phone]   Fax:  [phone]  Name: Wanda Ochoa MRN: [id number] Date of Birth: 1945-08-06

## 2019-07-26 ENCOUNTER — Other Ambulatory Visit: Payer: Self-pay

## 2019-07-26 ENCOUNTER — Ambulatory Visit: Payer: Medicare Other | Attending: Podiatry | Admitting: Physical Therapy

## 2019-07-26 ENCOUNTER — Encounter: Payer: Self-pay | Admitting: Physical Therapy

## 2019-07-26 DIAGNOSIS — R2689 Other abnormalities of gait and mobility: Secondary | ICD-10-CM | POA: Diagnosis not present

## 2019-07-26 DIAGNOSIS — R262 Difficulty in walking, not elsewhere classified: Secondary | ICD-10-CM | POA: Insufficient documentation

## 2019-07-26 DIAGNOSIS — M6281 Muscle weakness (generalized): Secondary | ICD-10-CM | POA: Insufficient documentation

## 2019-07-26 NOTE — Therapy (Signed)
[place] MAIN [facility] SERVICES [address] [place], [place], [address] Phone: [phone]   Fax:  [phone]  Physical Therapy Treatment  Patient Details  Name: Wanda Ochoa. Soutar MRN: [id number] Date of Birth: 30-Nov-1945 Referring Provider (PT): Lavon Paganini   Encounter Date: 07/26/2019  PT End of Session - 07/26/19 1619    Visit Number  19    PT Start Time  1600    PT Stop Time  1640    PT Time Calculation (min)  40 min    Equipment Utilized During Treatment  Gait belt    Activity Tolerance  Patient tolerated treatment well    Behavior During Therapy  WFL for tasks assessed/performed       Past Medical History:  Diagnosis Date  . Anxiety   . Arthritis    right knee  . Asthma   . HTN (hypertension)   . Thyroid disease     Past Surgical History:  Procedure Laterality Date  . ABDOMINAL HYSTERECTOMY  1990  . BLADDER SUSPENSION  1990  . BREAST CYST ASPIRATION Right 1985  . BREAST EXCISIONAL BIOPSY Right 1967   EXCISIONAL BX - NEG  . BREAST SURGERY Right 1967   Lumpectomy  . CESAREAN SECTION    . COLONOSCOPY WITH PROPOFOL N/A 12/18/2018   Procedure: COLONOSCOPY WITH PROPOFOL;  Surgeon: Lollie Sails, MD;  Location: [facility] ENDOSCOPY;  Service: Endoscopy;  Laterality: N/A;  . HEMORRHOIDECTOMY WITH HEMORRHOID BANDING  1988    There were no vitals filed for this visit.  Subjective Assessment - 07/26/19 1618    Subjective  Pateint reports that she fell yesterday in her yard. She did not get hurt.    Pertinent History  Patient is having some falls and had 4 falls in the last month. She was indoors and she did not get hurt. She does not use an AD.    Limitations  Walking    How long can you sit comfortably?  unlimited    How long can you stand comfortably?  unlimited    Patient Stated Goals  to not fall    Currently in Pain?  No/denies    Pain Score  0-No pain    Pain Onset  More than a month ago          Treatment: Neuromuscular Training: Stool: staggered stance,on 1/2 foam  Lateral step outs to stepping stones, each foot in front; VCs for proper technique and positioning for each exercise staggered stance, trunk rotation with 2 lb rod, VC to keep UE straight and turn head with trunk Horizontal stand on 1/2 foam flat side down , tapping stepping stones fwd x 10 alternating LE's Hurdle: Forward and backward stepping over hurdle x15 each direction, VCs to take big enough steps and to try to increase speed to work on coordination  Side stepping over hurdle 15x each direction  Blue Foam: Side stepping x10 on blue balance CGA for safety, VCs for taking a big enough step  Airex pad trunk rotation x2 min, CGA for safety, demonstrated difficulty with keeping arms extended and full rotation with head turn Airex pad, balloon tapping to mirror x2 min, supervision for safety with varying directions and speed of balloon, VCs for utilizing both hands and minimizing UE support  BOSU: Lunge to BOSU ball x 10 , cues for going slow and to control the speed  Matrix: Fwd/bwd gait with 22. 5 lbs and CGA, cues for posture and stepping strategies, occasional  LOB x 5 reps Side stepping left and right and CGA with cues to slow movement x 5 reps    Pt educated throughout session about proper posture and technique with exercises. Improved exercise technique, movement at target joints, use of target muscles after min to mod verbal, visual, tactile cues. CGA and Min to mod verbal cues used throughout with increased in postural sway and LOB most seen with narrow base of support and while on uneven surfaces. Continues to have balance deficits typical with diagnosis. Patient performs intermediate level exercises without pain behaviors and needs verbal cuing for postural alignment and head positioning                      PT Education - 07/26/19 1618    Education Details  HEP    Person(s) Educated   Patient    Methods  Explanation    Comprehension  Verbalized understanding       PT Short Term Goals - 06/16/19 1629      PT SHORT TERM GOAL #1   Title  Patient will be independent in home exercise program to improve strength/mobility for better functional independence with ADLs.    Period  Weeks    Status  New    Target Date  06/01/19        PT Long Term Goals - 07/21/19 1045      PT LONG TERM GOAL #1   Title  Patient (> 59 years old) will complete five times sit to stand test in < 15 seconds indicating an increased LE strength and improved balance.    Baseline  06/16/19=16.66 sec    Time  8    Period  Weeks    Status  Partially Met    Target Date  08/24/19      PT LONG TERM GOAL #2   Title  Patient will increase Berg Balance score by > 6 points to demonstrate decreased fall risk during functional activities.    Baseline  06/16/19=52/56    Time  8    Period  Weeks    Status  Partially Met    Target Date  08/24/19      PT LONG TERM GOAL #3   Title  Patient will reduce timed up and go to <11 seconds to reduce fall risk and demonstrate improved transfer/gait ability.    Baseline  06/16/19=11.64    Time  8    Period  Weeks    Status  Partially Met    Target Date  08/24/19            Plan - 07/26/19 1621    Clinical Impression Statement  Pt demonstrates increased postural sway when standing on uneven surface and requires // bars to steady, and demonstrates fatigue at end of set of exercises focused on strength and endurance.  Patient will continue to benefit from skilled PT for improved balance and strength   Personal Factors and Comorbidities  Age    Stability/Clinical Decision Making  Stable/Uncomplicated    Rehab Potential  Poor    PT Frequency  2x / week    PT Duration  8 weeks    PT Treatment/Interventions  Patient/family education;Balance training;Neuromuscular re-education;Manual techniques;Gait training;Therapeutic activities;Therapeutic exercise    PT Next  Visit Plan  balance and strengthening    Consulted and Agree with Plan of Care  Patient       Patient will benefit from skilled therapeutic intervention in order to improve the  following deficits and impairments:  Abnormal gait, Difficulty walking, Decreased strength, Impaired sensation  Visit Diagnosis: Difficulty in walking, not elsewhere classified  Other abnormalities of gait and mobility  Muscle weakness (generalized)     Problem List Patient Active Problem List   Diagnosis Date Noted  . Idiopathic gout involving toe of right foot 12/07/2018  . Effusion of right knee 06/18/2017  . Hematuria 09/11/2016  . Severe recurrent major depression without psychotic features ([place])   . Hypercholesteremia 05/02/2015  . Mitral valve prolapse 02/21/2015  . Allergic rhinitis 12/24/2014  . Osteopenia 12/24/2014  . Hypothyroidism 11/24/2014  . Generalized anxiety disorder 02/24/2012  . Nocturnal hypoxemia 01/20/2012  . Abdominal mass 01/20/2012  . Temporary cerebral vascular dysfunction 01/02/2009  . Benign hypertension 03/28/2008  . Asthma, exogenous 10/22/2007  . Adaptive colitis 03/12/2003    [name], PT DPT 07/26/2019, 4:22 PM  [place] MAIN [facility] SERVICES [address] [place], [place], [address] Phone: [phone]   Fax:  [phone]  Name: Dossie Ocanas. Macchia MRN: [id number] Date of Birth: 1945/10/03

## 2019-07-28 ENCOUNTER — Other Ambulatory Visit: Payer: Self-pay

## 2019-07-28 ENCOUNTER — Encounter: Payer: Self-pay | Admitting: Physical Therapy

## 2019-07-28 ENCOUNTER — Ambulatory Visit: Payer: Medicare Other | Admitting: Physical Therapy

## 2019-07-28 DIAGNOSIS — R262 Difficulty in walking, not elsewhere classified: Secondary | ICD-10-CM | POA: Diagnosis not present

## 2019-07-28 DIAGNOSIS — M6281 Muscle weakness (generalized): Secondary | ICD-10-CM | POA: Diagnosis not present

## 2019-07-28 DIAGNOSIS — R2689 Other abnormalities of gait and mobility: Secondary | ICD-10-CM | POA: Diagnosis not present

## 2019-07-28 NOTE — Therapy (Signed)
[place] MAIN [facility] SERVICES [address] [place], [place], [address] Phone: [phone]   Fax:  [phone]  Physical Therapy Treatment / discharge summary  Patient Details  Name: Wanda Ochoa. Grater MRN: [id number] Date of Birth: September 23, 1945 Referring Provider (PT): Lavon Paganini   Encounter Date: 07/28/2019  PT End of Session - 07/28/19 1610    Visit Number  20    PT Start Time  1600    PT Stop Time  1640    PT Time Calculation (min)  40 min    Equipment Utilized During Treatment  Gait belt    Activity Tolerance  Patient tolerated treatment well    Behavior During Therapy  WFL for tasks assessed/performed       Past Medical History:  Diagnosis Date  . Anxiety   . Arthritis    right knee  . Asthma   . HTN (hypertension)   . Thyroid disease     Past Surgical History:  Procedure Laterality Date  . ABDOMINAL HYSTERECTOMY  1990  . BLADDER SUSPENSION  1990  . BREAST CYST ASPIRATION Right 1985  . BREAST EXCISIONAL BIOPSY Right 1967   EXCISIONAL BX - NEG  . BREAST SURGERY Right 1967   Lumpectomy  . CESAREAN SECTION    . COLONOSCOPY WITH PROPOFOL N/A 12/18/2018   Procedure: COLONOSCOPY WITH PROPOFOL;  Surgeon: Lollie Sails, MD;  Location: [facility] ENDOSCOPY;  Service: Endoscopy;  Laterality: N/A;  . HEMORRHOIDECTOMY WITH HEMORRHOID BANDING  1988    There were no vitals filed for this visit.  Subjective Assessment - 07/28/19 1609    Subjective  Patient is feeling wells.    Pertinent History  Patient is having some falls and had 4 falls in the last month. She was indoors and she did not get hurt. She does not use an AD.    Limitations  Walking    How long can you sit comfortably?  unlimited    How long can you stand comfortably?  unlimited    Patient Stated Goals  to not fall    Pain Onset  More than a month ago         [facility] PT Assessment - 07/28/19 0001      Berg Balance Test   Sit to Stand  Able to stand  without using hands and stabilize independently    Standing Unsupported  Able to stand safely 2 minutes    Sitting with Back Unsupported but Feet Supported on Floor or Stool  Able to sit safely and securely 2 minutes    Stand to Sit  Sits safely with minimal use of hands    Transfers  Able to transfer safely, minor use of hands    Standing Unsupported with Eyes Closed  Able to stand 10 seconds safely    Standing Unsupported with Feet Together  Able to place feet together independently and stand 1 minute safely    From Standing, Reach Forward with Outstretched Arm  Can reach confidently >25 cm (10")    From Standing Position, Pick up Object from Floor  Able to pick up shoe safely and easily    From Standing Position, Turn to Look Behind Over each Shoulder  Looks behind from both sides and weight shifts well    Turn 360 Degrees  Able to turn 360 degrees safely in 4 seconds or less    Standing Unsupported, Alternately Place Feet on Step/Stool  Able to stand independently and safely and complete  8 steps in 20 seconds    Standing Unsupported, One Foot in [place] to plae foot ahead of the other independently and hold 30 seconds    Standing on One Leg  Able to lift leg independently and hold equal to or more than 3 seconds    Total Score  53         Treatment: Goals reviewed and outcome measures performed.  Berg balance, 5 x sit to stand and TUG Reviewed HEP : including SLS for balance in the corner and tandem standing and modified tandem standing Reviewed safety for when performing outdoor gardening including use of loftstrand crutch or small seat for reaching ground during weeding and transplanting flowers.  Reviewed hx of falls and what could be changed in the future to prevent the same scenarios from reoccurring. .                    PT Education - 07/28/19 1610    Education Details  HEP    Person(s) Educated  Patient    Methods  Explanation    Comprehension   Verbalized understanding;Returned demonstration       PT Short Term Goals - 07/28/19 1623      PT SHORT TERM GOAL #1   Title  Patient will be independent in home exercise program to improve strength/mobility for better functional independence with ADLs.    Period  Weeks    Status  Achieved    Target Date  07/28/19        PT Long Term Goals - 07/28/19 1611      PT LONG TERM GOAL #1   Title  Patient (> 67 years old) will complete five times sit to stand test in < 15 seconds indicating an increased LE strength and improved balance.    Baseline  06/16/19=16.66 sec, 07/28/19=13.52 sec    Time  8    Period  Weeks    Status  Achieved    Target Date  07/28/19      PT LONG TERM GOAL #2   Title  Patient will increase Berg Balance score by > 6 points to demonstrate decreased fall risk during functional activities.    Baseline  06/16/19=52/56    Time  8    Period  Weeks    Status  Achieved    Target Date  07/28/19      PT LONG TERM GOAL #3   Title  Patient will reduce timed up and go to <11 seconds to reduce fall risk and demonstrate improved transfer/gait ability.    Baseline  06/16/19=11.64,  07/28/19=10.32    Time  8    Period  Weeks    Status  Partially Met            Plan - 07/28/19 1610    Clinical Impression Statement  Patient has demonstrated improved dynamic balance, gait, transfers, mobility, LE strength, UE strength,  and has made progress with goals # 1,2,3,. Patient will be discharged from skilled physical therapy to HEP.   Personal Factors and Comorbidities  Age    Stability/Clinical Decision Making  Stable/Uncomplicated    Rehab Potential  Poor    PT Frequency  2x / week    PT Duration  8 weeks    PT Treatment/Interventions  Patient/family education;Balance training;Neuromuscular re-education;Manual techniques;Gait training;Therapeutic activities;Therapeutic exercise    PT Next Visit Plan  balance and strengthening    Consulted and Agree with Plan of Care  Patient  Patient will benefit from skilled therapeutic intervention in order to improve the following deficits and impairments:  Abnormal gait, Difficulty walking, Decreased strength, Impaired sensation  Visit Diagnosis: Difficulty in walking, not elsewhere classified  Other abnormalities of gait and mobility  Muscle weakness (generalized)     Problem List Patient Active Problem List   Diagnosis Date Noted  . Idiopathic gout involving toe of right foot 12/07/2018  . Effusion of right knee 06/18/2017  . Hematuria 09/11/2016  . Severe recurrent major depression without psychotic features ([place])   . Hypercholesteremia 05/02/2015  . Mitral valve prolapse 02/21/2015  . Allergic rhinitis 12/24/2014  . Osteopenia 12/24/2014  . Hypothyroidism 11/24/2014  . Generalized anxiety disorder 02/24/2012  . Nocturnal hypoxemia 01/20/2012  . Abdominal mass 01/20/2012  . Temporary cerebral vascular dysfunction 01/02/2009  . Benign hypertension 03/28/2008  . Asthma, exogenous 10/22/2007  . Adaptive colitis 03/12/2003    [name], PT DPT 07/28/2019, 4:51 PM  [place] MAIN [facility] SERVICES [address] [place], [place], [address] Phone: [phone]   Fax:  [phone]  Name: Arthur Aydelotte. Hnat MRN: [id number] Date of Birth: 04-23-1945

## 2019-07-29 DIAGNOSIS — F411 Generalized anxiety disorder: Secondary | ICD-10-CM | POA: Diagnosis not present

## 2019-07-29 DIAGNOSIS — F331 Major depressive disorder, recurrent, moderate: Secondary | ICD-10-CM | POA: Diagnosis not present

## 2019-07-29 DIAGNOSIS — F5105 Insomnia due to other mental disorder: Secondary | ICD-10-CM | POA: Diagnosis not present

## 2019-08-02 ENCOUNTER — Ambulatory Visit: Payer: Medicare Other | Admitting: Physical Therapy

## 2019-08-04 ENCOUNTER — Ambulatory Visit: Payer: Medicare Other | Admitting: Physical Therapy

## 2019-08-09 ENCOUNTER — Ambulatory Visit: Payer: Medicare Other

## 2019-08-09 NOTE — Progress Notes (Signed)
Established patient visit   Patient: Wanda Ochoa. Roskelley   DOB: 22-Mar-1946   74 y.o. Female  MRN: [id number] Visit Date: 08/10/2019  Today's healthcare provider: Lavon Paganini, MD   Chief Complaint  Patient presents with  . Hypertension  . Hypothyroidism  . Hyperlipidemia   Subjective    Thyroid Problem Presents for follow-up visit. Patient reports no anxiety, cold intolerance, constipation, depressed mood, diarrhea, dry skin, fatigue, hair loss, heat intolerance, weight gain or weight loss. The symptoms have been stable.   Hypertension, follow-up  BP Readings from Last 3 Encounters:  08/10/19 128/62  08/10/19 128/62  06/07/19 (!) 166/80   Wt Readings from Last 3 Encounters:  08/10/19 187 lb (84.8 kg)  08/10/19 187 lb (84.8 kg)  06/07/19 180 lb (81.6 kg)     She was last seen for hypertension 3 months ago.  BP at that visit was 159/80 . Management since that visit includes No changes.  She reports excellent compliance with treatment. She is not having side effects.  She is following a Regular diet. She is exercising. She does not smoke.  Use of agents associated with hypertension: none.   Outside blood pressures are high in the mornings. Symptoms: No chest pain No chest pressure  No palpitations No syncope  No dyspnea No orthopnea  No paroxysmal nocturnal dyspnea No lower extremity edema   Pertinent labs: Lab Results  Component Value Date   CHOL 236 (H) 12/07/2018   HDL 53 12/07/2018   LDLCALC 148 (H) 12/07/2018   TRIG 194 (H) 12/07/2018   CHOLHDL 4.5 (H) 12/07/2018   Lab Results  Component Value Date   NA 137 02/08/2019   K 4.3 02/08/2019   CREATININE 1.01 (H) 02/08/2019   GFRNONAA 56 (L) 02/08/2019   GFRAA 64 02/08/2019   GLUCOSE 85 02/08/2019     The 10-year ASCVD risk score Mikey Bussing DC Jr., et al., 2013) is: 17.7%    ---------------------------------------------------------------------------------------------------  Lipid/Cholesterol, Follow-up  Last lipid panel Other pertinent labs  Lab Results  Component Value Date   CHOL 236 (H) 12/07/2018   HDL 53 12/07/2018   LDLCALC 148 (H) 12/07/2018   TRIG 194 (H) 12/07/2018   CHOLHDL 4.5 (H) 12/07/2018   Lab Results  Component Value Date   ALT 18 12/07/2018   AST 28 12/07/2018   PLT 200 12/23/2018   TSH 0.193 (L) 02/08/2019     She was last seen for this 8 months ago.  Management since that visit includes working on lifestyle changes.  She reports excellent compliance with treatment. She is not having side effects.     Current diet: in general, a "healthy" diet   Current exercise: walking  The 10-year ASCVD risk score Mikey Bussing DC Brooke Bonito., et al., 2013) is: 17.7%  ---------------------------------------------------------------------------------------------------   Patient Active Problem List   Diagnosis Date Noted  . Idiopathic gout involving toe of right foot 12/07/2018  . Hematuria 09/11/2016  . Severe recurrent major depression without psychotic features ([place])   . Hypercholesteremia 05/02/2015  . Mitral valve prolapse 02/21/2015  . Allergic rhinitis 12/24/2014  . Osteopenia 12/24/2014  . Hypothyroidism 11/24/2014  . Generalized anxiety disorder 02/24/2012  . Nocturnal hypoxemia 01/20/2012  . Abdominal mass 01/20/2012  . Temporary cerebral vascular dysfunction 01/02/2009  . Benign hypertension 03/28/2008  . Asthma, exogenous 10/22/2007  . Adaptive colitis 03/12/2003   Past Medical History:  Diagnosis Date  . Anxiety   . Arthritis    right knee  . Asthma   .  HTN (hypertension)   . Thyroid disease    Social History   Tobacco Use  . Smoking status: Never Smoker  . Smokeless tobacco: Never Used  Substance Use Topics  . Alcohol use: No  . Drug use: No   Allergies  Allergen Reactions  . Penicillins Anaphylaxis        Medications: Outpatient Medications Prior to Visit  Medication Sig  . amLODipine (NORVASC) 5 MG tablet Take 5 mg by mouth 2 (two) times daily.   Marland Kitchen aspirin 81 MG tablet Take 81 mg by mouth daily.  . citalopram (CELEXA) 10 MG tablet Take 10 mg by mouth daily.  . hydrochlorothiazide (HYDRODIURIL) 12.5 MG tablet Take 1 tablet (12.5 mg total) by mouth daily.  . hydrOXYzine (ATARAX/VISTARIL) 25 MG tablet Take 25 mg by mouth 4 (four) times daily as needed.  Marland Kitchen KRILL OIL PO Take 1 capsule by mouth daily. Reported on 06/02/2015  . levothyroxine (SYNTHROID) 100 MCG tablet Take 1 tablet (100 mcg total) by mouth daily.  Marland Kitchen lisinopril (PRINIVIL,ZESTRIL) 20 MG tablet Take 20 mg by mouth 2 (two) times daily.   Marland Kitchen lisinopril (ZESTRIL) 40 MG tablet Take 20 mg by mouth 2 (two) times daily.  . metoprolol tartrate (LOPRESSOR) 50 MG tablet Take 1 tablet (50 mg total) by mouth 2 (two) times daily. (Patient taking differently: Take 25 mg by mouth 2 (two) times daily. )  . Multiple Vitamin (MULTIVITAMIN) capsule Take 1 capsule by mouth daily.  . nortriptyline (PAMELOR) 25 MG capsule Take 4 capsule at bed time (Patient taking differently: Take 3 capsule at bed time)  . Omega 3-6-9 CAPS Take by mouth daily.   . polyethylene glycol (MIRALAX / GLYCOLAX) 17 g packet Take 17 g by mouth daily. Mix in 4-8 oz of fluid  . QUEtiapine (SEROQUEL) 100 MG tablet Take 200 mg by mouth at bedtime.   Marland Kitchen QUEtiapine (SEROQUEL) 25 MG tablet Take 50 mg by mouth at bedtime.   . [DISCONTINUED] VENTOLIN HFA 108 (90 Base) MCG/ACT inhaler INHALE 2 PUFFS INTO THE LUNGS EVERY 6 HOURS AS NEEDED   No facility-administered medications prior to visit.    Review of Systems  Constitutional: Negative for fatigue, weight gain and weight loss.  Gastrointestinal: Negative for constipation and diarrhea.  Endocrine: Negative for cold intolerance and heat intolerance.  Psychiatric/Behavioral: The patient is not nervous/anxious.     Last CBC Lab  Results  Component Value Date   WBC 7.4 12/23/2018   HGB 11.7 (L) 12/23/2018   HCT 34.0 (L) 12/23/2018   MCV 95.2 12/23/2018   MCH 32.8 12/23/2018   RDW 11.9 12/23/2018   PLT 200 [id number]   Last metabolic panel Lab Results  Component Value Date   GLUCOSE 85 02/08/2019   NA 137 02/08/2019   K 4.3 02/08/2019   CL 97 02/08/2019   CO2 25 02/08/2019   BUN 11 02/08/2019   CREATININE 1.01 (H) 02/08/2019   GFRNONAA 56 (L) 02/08/2019   GFRAA 64 02/08/2019   CALCIUM 9.1 02/08/2019   PHOS 3.4 05/16/2016   PROT 7.2 12/07/2018   ALBUMIN 4.1 12/07/2018   LABGLOB 3.1 12/07/2018   AGRATIO 1.3 12/07/2018   BILITOT 0.4 12/07/2018   ALKPHOS 130 (H) 12/07/2018   AST 28 12/07/2018   ALT 18 12/07/2018   ANIONGAP 11 12/23/2018   Last lipids Lab Results  Component Value Date   CHOL 236 (H) 12/07/2018   HDL 53 12/07/2018   LDLCALC 148 (H) 12/07/2018   TRIG 194 (H)  12/07/2018   CHOLHDL 4.5 (H) 12/07/2018   Last hemoglobin A1c Lab Results  Component Value Date   HGBA1C 5.1 12/07/2018   Last thyroid functions Lab Results  Component Value Date   TSH 0.193 (L) 02/08/2019      Objective    BP 128/62 (BP Location: Right Arm, Patient Position: Sitting, Cuff Size: Normal)   Pulse 75   Temp 97.8 F (36.6 C) (Oral)   Ht 5\' 9"  (1.753 m)   Wt 187 lb (84.8 kg)   BMI 27.62 kg/m  BP Readings from Last 3 Encounters:  08/10/19 128/62  08/10/19 128/62  06/07/19 (!) 166/80      Physical Exam Vitals reviewed.  Constitutional:      General: She is not in acute distress.    Appearance: Normal appearance. She is well-developed. She is not diaphoretic.  HENT:     Head: Normocephalic and atraumatic.     Right Ear: Tympanic membrane, ear canal and external ear normal.     Left Ear: Tympanic membrane, ear canal and external ear normal.  Eyes:     General: No scleral icterus.    Conjunctiva/sclera: Conjunctivae normal.     Pupils: Pupils are equal, round, and reactive to light.  Neck:      Thyroid: No thyromegaly.  Cardiovascular:     Rate and Rhythm: Normal rate and regular rhythm.     Pulses: Normal pulses.     Heart sounds: Normal heart sounds. No murmur.  Pulmonary:     Effort: Pulmonary effort is normal. No respiratory distress.     Breath sounds: Normal breath sounds. No wheezing, rhonchi or rales.  Abdominal:     General: There is no distension.     Palpations: Abdomen is soft.     Tenderness: There is no abdominal tenderness.  Musculoskeletal:     Cervical back: Neck supple.     Right lower leg: No edema.     Left lower leg: No edema.  Lymphadenopathy:     Cervical: No cervical adenopathy.  Skin:    General: Skin is warm and dry.     Findings: No rash.  Neurological:     Mental Status: She is alert and oriented to person, place, and time. Mental status is at baseline.  Psychiatric:        Mood and Affect: Mood normal.        Behavior: Behavior normal.      No results found for any visits on 08/10/19.  Assessment & Plan     Problem List Items Addressed This Visit      Cardiovascular and Mediastinum   Benign hypertension - Primary    Well controlled Continue current medications Recheck metabolic panel F/u in 6 months       Relevant Orders   Comprehensive metabolic panel     Respiratory   Asthma, exogenous    Chronic, stable, mild No acute exacerbation Continue albuterol prn      Relevant Medications   albuterol (VENTOLIN HFA) 108 (90 Base) MCG/ACT inhaler     Endocrine   Hypothyroidism    Previously well controlled Continue Synthroid at current dose  Recheck TSH and adjust Synthroid as indicated       Relevant Orders   TSH     Other   Generalized anxiety disorder    Well controlled Followed by psych      Hypercholesteremia    Reviewed last lipid panel Not currently on a statin Goal LDL <130 Recheck FLP  and CMP Discussed diet and exercise       Relevant Orders   Comprehensive metabolic panel   Lipid panel    Severe recurrent major depression without psychotic features ([place])    Well controlled Followed by psych Check CBC, lipid panel, A1c given Seroquel use       Other Visit Diagnoses    Asthma, exogenous, unspecified asthma severity, uncomplicated       Relevant Medications   albuterol (VENTOLIN HFA) 108 (90 Base) MCG/ACT inhaler   Anemia, unspecified type       Relevant Orders   CBC w/Diff/Platelet   Long-term use of high-risk medication       Relevant Orders   Hemoglobin A1c       Return in about 6 months (around 02/10/2020) for chronic disease f/u.      I, Lavon Paganini, MD, have reviewed all documentation for this visit. The documentation on 08/10/19 for the exam, diagnosis, procedures, and orders are all accurate and complete.   Awilda Covin, Dionne Bucy, MD, MPH [facility]

## 2019-08-09 NOTE — Progress Notes (Signed)
Subjective:   Wanda Ochoa is a 74 y.o. female who presents for Medicare Annual (Subsequent) preventive examination.  Review of Systems:  N/A  Cardiac Risk Factors include: advanced age (>69men, >2 women);hypertension;dyslipidemia     Objective:     Vitals: BP 128/62 (BP Location: Right Arm)   Pulse 75   Temp 97.8 F (36.6 C) (Oral)   Ht 5\' 9"  (1.753 m)   Wt 187 lb (84.8 kg)   SpO2 95%   BMI 27.62 kg/m   Body mass index is 27.62 kg/m.  Advanced Directives 08/10/2019 06/07/2019 05/04/2019 12/23/2018 12/18/2018 08/05/2018 06/13/2017  Does Patient Have a Medical Advance Directive? Yes Yes Yes Yes Yes Yes Yes  Type of Paramedic of [place];Living will Living will;Healthcare Power of [place];Living will [place];Out of facility DNR (pink MOST or yellow form);Living will - [place];Living will Living will  Does patient want to make changes to medical advance directive? - No - Patient declined - - - - -  Copy of [place] in Chart? No - copy requested No - copy requested - No - copy requested - No - copy requested -    Tobacco Social History   Tobacco Use  Smoking Status Never Smoker  Smokeless Tobacco Never Used     Counseling given: Not Answered   Clinical Intake:  Pre-visit preparation completed: Yes  Pain : No/denies pain Pain Score: 0-No pain     Nutritional Status: BMI 25 -29 Overweight Nutritional Risks: None Diabetes: No  How often do you need to have someone help you when you read instructions, pamphlets, or other written materials from your doctor or pharmacy?: 1 - Never  Interpreter Needed?: No  Information entered by :: [facility], LPN  Past Medical History:  Diagnosis Date  . Anxiety   . Arthritis    right knee  . Asthma   . HTN (hypertension)   . Thyroid disease    Past Surgical History:  Procedure Laterality Date  .  ABDOMINAL HYSTERECTOMY  1990  . BLADDER SUSPENSION  1990  . BREAST CYST ASPIRATION Right 1985  . BREAST EXCISIONAL BIOPSY Right 1967   EXCISIONAL BX - NEG  . BREAST SURGERY Right 1967   Lumpectomy  . CESAREAN SECTION    . COLONOSCOPY WITH PROPOFOL N/A 12/18/2018   Procedure: COLONOSCOPY WITH PROPOFOL;  Surgeon: Lollie Sails, MD;  Location[facility] ENDOSCOPY;  Service: Endoscopy;  Laterality: N/A;  . HEMORRHOIDECTOMY WITH HEMORRHOID BANDING  1988   Family History  Problem Relation Age of Onset  . Depression Father   . Suicidality Father   . Depression Sister   . Breast cancer Sister 56  . Autism Son   . Heart disease Mother   . Colon cancer Mother   . Hypertension Mother   . Anxiety disorder Mother   . Mental illness Daughter   . Allergies Brother   . Allergies Sister   . Asthma Maternal Uncle   . Heart disease Maternal Grandfather   . Heart disease Maternal Grandmother   . Breast cancer Paternal Grandmother   . Breast cancer Other    Social History   Socioeconomic History  . Marital status: Married    Spouse name: Shawnn Rottman  . Number of children: 3  . Years of education: 16  . Highest education level: Bachelor's degree (e.g., BA, AB, BS)  Occupational History  . Occupation: retired    Fish farm manager:  [employer]    Comment: volunteer at hospital  Tobacco Use  . Smoking status: Never Smoker  . Smokeless tobacco: Never Used  Substance and Sexual Activity  . Alcohol use: No  . Drug use: No  . Sexual activity: Not on file  Other Topics Concern  . Not on file  Social History Narrative  . Not on file   Social Determinants of Health   Financial Resource Strain: Low Risk   . Difficulty of Paying Living Expenses: Not hard at all  Food Insecurity: No Food Insecurity  . Worried About Charity fundraiser in the Last Year: Never true  . Ran Out of Food in the Last Year: Never true  Transportation Needs: No Transportation Needs  . Lack of  Transportation (Medical): No  . Lack of Transportation (Non-Medical): No  Physical Activity: Sufficiently Active  . Days of Exercise per Week: 4 days  . Minutes of Exercise per Session: 60 min  Stress: Stress Concern Present  . Feeling of Stress : To some extent  Social Connections: Slightly Isolated  . Frequency of Communication with Friends and Family: Twice a week  . Frequency of Social Gatherings with Friends and Family: More than three times a week  . Attends Religious Services: Never  . Active Member of Clubs or Organizations: Yes  . Attends Archivist Meetings: More than 4 times per year  . Marital Status: Married    Outpatient Encounter Medications as of 08/10/2019  Medication Sig  . amLODipine (NORVASC) 5 MG tablet Take 5 mg by mouth 2 (two) times daily.   Marland Kitchen aspirin 81 MG tablet Take 81 mg by mouth daily.  . citalopram (CELEXA) 10 MG tablet Take 10 mg by mouth daily.  . hydrochlorothiazide (HYDRODIURIL) 12.5 MG tablet Take 1 tablet (12.5 mg total) by mouth daily.  . hydrOXYzine (ATARAX/VISTARIL) 25 MG tablet Take 25 mg by mouth 4 (four) times daily as needed.  Marland Kitchen KRILL OIL PO Take 1 capsule by mouth daily. Reported on 06/02/2015  . levothyroxine (SYNTHROID) 100 MCG tablet Take 1 tablet (100 mcg total) by mouth daily.  Marland Kitchen lisinopril (PRINIVIL,ZESTRIL) 20 MG tablet Take 20 mg by mouth 2 (two) times daily.   . metoprolol tartrate (LOPRESSOR) 50 MG tablet Take 1 tablet (50 mg total) by mouth 2 (two) times daily. (Patient taking differently: Take 25 mg by mouth 2 (two) times daily. )  . Multiple Vitamin (MULTIVITAMIN) capsule Take 1 capsule by mouth daily.  . nortriptyline (PAMELOR) 25 MG capsule Take 4 capsule at bed time (Patient taking differently: Take 3 capsule at bed time)  . Omega 3-6-9 CAPS Take by mouth daily.   . polyethylene glycol (MIRALAX / GLYCOLAX) 17 g packet Take 17 g by mouth daily. Mix in 4-8 oz of fluid  . QUEtiapine (SEROQUEL) 100 MG tablet Take 200 mg by  mouth at bedtime.   . VENTOLIN HFA 108 (90 Base) MCG/ACT inhaler INHALE 2 PUFFS INTO THE LUNGS EVERY 6 HOURS AS NEEDED  . lisinopril (ZESTRIL) 40 MG tablet Take 20 mg by mouth 2 (two) times daily.  . QUEtiapine (SEROQUEL) 25 MG tablet Take 50 mg by mouth at bedtime.    No facility-administered encounter medications on file as of 08/10/2019.    Activities of Daily Living In your present state of health, do you have any difficulty performing the following activities: 08/10/2019  Hearing? N  Vision? N  Difficulty concentrating or making decisions? Y  Walking or climbing stairs?  N  Dressing or bathing? N  Doing errands, shopping? N  Preparing Food and eating ? N  Using the Toilet? N  In the past six months, have you accidently leaked urine? N  Do you have problems with loss of bowel control? N  Managing your Medications? N  Managing your Finances? N  Housekeeping or managing your Housekeeping? N  Some recent data might be hidden    Patient Care Team: Virginia Crews, MD as PCP - General (Family Medicine) Chauncey Mann, MD as Referring Physician (Psychiatry) Corey Skains, MD as Consulting Physician (Cardiology) Lollie Sails, MD (Inactive) as Consulting Physician (Gastroenterology) Miguel Dibble, LCSW as Social Worker (Licensed Clinical Social Worker) Estill Cotta, MD (Ophthalmology)    Assessment:   This is a routine wellness examination for [employer].  Exercise Activities and Dietary recommendations Current Exercise Habits: Home exercise routine, Type of exercise: walking, Time (Minutes): 60, Frequency (Times/Week): 4, Weekly Exercise (Minutes/Week): 240, Intensity: Mild, Exercise limited by: None identified  Goals    . DIET - INCREASE WATER INTAKE     Recommend increasing water intake to 4 glasses a day.     Marland Kitchen LIFESTYLE - DECREASE FALLS RISK     Recommend to remove any items from the home that may cause slips or trips.        Fall  Risk: Fall Risk  08/10/2019 08/05/2018 06/13/2017 05/14/2016 05/02/2015  Falls in the past year? 1 1 No Yes No  Number falls in past yr: 1 1 - 1 -  Injury with Fall? 0 0 - No -  Risk for fall due to : Other (Comment) - - - -  Risk for fall due to: Comment Accidental falls - - - -  Follow up Falls prevention discussed Falls prevention discussed - - -  Comment Completed PT for previous falls. - - - -    FALL RISK PREVENTION PERTAINING TO THE HOME:  Any stairs in or around the home? Yes  If so, are there any without handrails? No   Home free of loose throw rugs in walkways, pet beds, electrical cords, etc? Yes  Adequate lighting in your home to reduce risk of falls? Yes   ASSISTIVE DEVICES UTILIZED TO PREVENT FALLS:  Life alert? No  Use of a cane, walker or w/c? No  Grab bars in the bathroom? Yes  Shower chair or bench in shower? No  Elevated toilet seat or a handicapped toilet? No    TIMED UP AND GO:  Was the test performed? No .    Depression Screen PHQ 2/9 Scores 08/10/2019 04/30/2019 02/08/2019 08/05/2018  PHQ - 2 Score 0 1 0 0  PHQ- 9 Score - 3 3 -     Cognitive Function     6CIT Screen 08/10/2019 08/05/2018 06/13/2017  What Year? 0 points 0 points 0 points  What month? 0 points 0 points 0 points  What time? 0 points 0 points 0 points  Count back from 20 0 points 0 points 0 points  Months in reverse 0 points 0 points 0 points  Repeat phrase 6 points 0 points 0 points  Total Score 6 0 0    Immunization History  Administered Date(s) Administered  . Influenza Split 01/16/2012  . Influenza, High Dose Seasonal PF 01/17/2016  . Influenza-Unspecified 12/23/2012, 12/05/2017, 01/08/2019  . PFIZER SARS-COV-2 Vaccination 04/12/2019, 05/04/2019  . Pneumococcal Conjugate-13 04/13/2014  . Pneumococcal Polysaccharide-23 01/16/2012  . Tdap 04/13/2014  . Zoster 01/29/2010  Qualifies for Shingles Vaccine? Yes  Zostavax completed 01/29/10. Due for Shingrix. Pt has been advised to  call insurance company to determine out of pocket expense. Advised may also receive vaccine at local pharmacy or Health Dept. Verbalized acceptance and understanding.  Tdap: Up to date  Flu Vaccine: Up to date  Pneumococcal Vaccine: Completed series  Screening Tests Health Maintenance  Topic Date Due  . INFLUENZA VACCINE  10/24/2019  . MAMMOGRAM  10/06/2020  . DEXA SCAN  07/24/2022  . COLONOSCOPY  12/18/2023  . TETANUS/TDAP  04/13/2024  . COVID-19 Vaccine  Completed  . Hepatitis C Screening  Completed  . PNA vac Low Risk Adult  Completed    Cancer Screenings:  Colorectal Screening: Completed 12/18/18. Repeat every 5 years.   Mammogram: Completed 10/07/18. Repeat every 1-2 years as advised.   Bone Density: Completed 07/23/17. Results reflect OSTEOPENIA. Repeat every 5 years.   Lung Cancer Screening: (Low Dose CT Chest recommended if Age 65-80 years, 30 pack-year currently smoking OR have quit w/in 15years.) does not qualify.   Additional Screening:  Hepatitis C Screening: Up to date  Vision Screening: Recommended annual ophthalmology exams for early detection of glaucoma and other disorders of the eye.  Dental Screening: Recommended annual dental exams for proper oral hygiene  Community Resource Referral:  CRR required this visit? No     Plan:  I have personally reviewed and addressed the Medicare Annual Wellness questionnaire and have noted the following in the patient's chart:  A. Medical and social history B. Use of alcohol, tobacco or illicit drugs  C. Current medications and supplements D. Functional ability and status E.  Nutritional status F.  Physical activity G. Advance directives H. List of other physicians I.  Hospitalizations, surgeries, and ER visits in previous 12 months J.  [place] such as hearing and vision if needed, cognitive and depression L. Referrals and appointments   In addition, I have reviewed and discussed with patient  certain preventive protocols, quality metrics, and best practice recommendations. A written personalized care plan for preventive services as well as general preventive health recommendations were provided to patient. Nurse Health Advisor  Signed,    Charlane Westry [place], [place]  [id number] Nurse Health Advisor   Nurse Notes: None.

## 2019-08-10 ENCOUNTER — Ambulatory Visit (INDEPENDENT_AMBULATORY_CARE_PROVIDER_SITE_OTHER): Payer: Medicare Other | Admitting: Family Medicine

## 2019-08-10 ENCOUNTER — Encounter: Payer: Self-pay | Admitting: Family Medicine

## 2019-08-10 ENCOUNTER — Other Ambulatory Visit: Payer: Self-pay

## 2019-08-10 ENCOUNTER — Other Ambulatory Visit: Payer: Self-pay | Admitting: Family Medicine

## 2019-08-10 ENCOUNTER — Ambulatory Visit (INDEPENDENT_AMBULATORY_CARE_PROVIDER_SITE_OTHER): Payer: Medicare Other

## 2019-08-10 VITALS — BP 128/62 | HR 75 | Temp 97.8°F | Ht 69.0 in | Wt 187.0 lb

## 2019-08-10 DIAGNOSIS — I10 Essential (primary) hypertension: Secondary | ICD-10-CM | POA: Diagnosis not present

## 2019-08-10 DIAGNOSIS — F332 Major depressive disorder, recurrent severe without psychotic features: Secondary | ICD-10-CM

## 2019-08-10 DIAGNOSIS — J4520 Mild intermittent asthma, uncomplicated: Secondary | ICD-10-CM | POA: Diagnosis not present

## 2019-08-10 DIAGNOSIS — Z79899 Other long term (current) drug therapy: Secondary | ICD-10-CM | POA: Diagnosis not present

## 2019-08-10 DIAGNOSIS — E7800 Pure hypercholesterolemia, unspecified: Secondary | ICD-10-CM

## 2019-08-10 DIAGNOSIS — Z0000 Encounter for general adult medical examination without abnormal findings: Secondary | ICD-10-CM | POA: Diagnosis not present

## 2019-08-10 DIAGNOSIS — F411 Generalized anxiety disorder: Secondary | ICD-10-CM | POA: Diagnosis not present

## 2019-08-10 DIAGNOSIS — J45909 Unspecified asthma, uncomplicated: Secondary | ICD-10-CM | POA: Diagnosis not present

## 2019-08-10 DIAGNOSIS — D649 Anemia, unspecified: Secondary | ICD-10-CM | POA: Diagnosis not present

## 2019-08-10 DIAGNOSIS — F331 Major depressive disorder, recurrent, moderate: Secondary | ICD-10-CM | POA: Diagnosis not present

## 2019-08-10 DIAGNOSIS — E039 Hypothyroidism, unspecified: Secondary | ICD-10-CM | POA: Diagnosis not present

## 2019-08-10 MED ORDER — ALBUTEROL SULFATE HFA 108 (90 BASE) MCG/ACT IN AERS: INHALATION_SPRAY | RESPIRATORY_TRACT | 4 refills | Status: DC

## 2019-08-10 NOTE — Patient Instructions (Signed)
Wanda Ochoa , Thank you for taking time to come for your Medicare Wellness Visit. I appreciate your ongoing commitment to your health goals. Please review the following plan we discussed and let me know if I can assist you in the future.   Screening recommendations/referrals: Colonoscopy: Up to date, due 11/2023 Mammogram: Up to date, due 09/2020 Bone Density: Up to date, due 07/2022 Recommended yearly ophthalmology/optometry visit for glaucoma screening and checkup Recommended yearly dental visit for hygiene and checkup  Vaccinations: Influenza vaccine: Up to date Pneumococcal vaccine: Completed series Tdap vaccine: Up to date, due 03/2024 Shingles vaccine: Pt declines today.     Advanced directives: Please bring a copy of your POA (Power of Attorney) and/or Living Will to your next appointment.   Conditions/risks identified: Fall risk prevention discussed today. Recommend increasing water intake to 6-8 8 oz glasses a day.   Next appointment: 9:00 AM today with Dr Brita Romp    Preventive Care 73 Years and Older, Female Preventive care refers to lifestyle choices and visits with your health care provider that can promote health and wellness. What does preventive care include?  A yearly physical exam. This is also called an annual well check.  Dental exams once or twice a year.  Routine eye exams. Ask your health care provider how often you should have your eyes checked.  Personal lifestyle choices, including:  Daily care of your teeth and gums.  Regular physical activity.  Eating a healthy diet.  Avoiding tobacco and drug use.  Limiting alcohol use.  Practicing safe sex.  Taking low-dose aspirin every day.  Taking vitamin and mineral supplements as recommended by your health care provider. What happens during an annual well check? The services and screenings done by your health care provider during your annual well check will depend on your age, overall health,  lifestyle risk factors, and family history of disease. Counseling  Your health care provider may ask you questions about your:  Alcohol use.  Tobacco use.  Drug use.  Emotional well-being.  Home and relationship well-being.  Sexual activity.  Eating habits.  History of falls.  Memory and ability to understand (cognition).  Work and work Statistician.  Reproductive health. Screening  You may have the following tests or measurements:  Height, weight, and BMI.  Blood pressure.  Lipid and cholesterol levels. These may be checked every 5 years, or more frequently if you are over 57 years old.  Skin check.  Lung cancer screening. You may have this screening every year starting at age 35 if you have a 30-pack-year history of smoking and currently smoke or have quit within the past 15 years.  Fecal occult blood test (FOBT) of the stool. You may have this test every year starting at age 48.  Flexible sigmoidoscopy or colonoscopy. You may have a sigmoidoscopy every 5 years or a colonoscopy every 10 years starting at age 40.  Hepatitis C blood test.  Hepatitis B blood test.  Sexually transmitted disease (STD) testing.  Diabetes screening. This is done by checking your blood sugar (glucose) after you have not eaten for a while (fasting). You may have this done every 1-3 years.  Bone density scan. This is done to screen for osteoporosis. You may have this done starting at age 71.  Mammogram. This may be done every 1-2 years. Talk to your health care provider about how often you should have regular mammograms. Talk with your health care provider about your test results, treatment options, and if  necessary, the need for more tests. Vaccines  Your health care provider may recommend certain vaccines, such as:  Influenza vaccine. This is recommended every year.  Tetanus, diphtheria, and acellular pertussis (Tdap, Td) vaccine. You may need a Td booster every 10 years.  Zoster  vaccine. You may need this after age 24.  Pneumococcal 13-valent conjugate (PCV13) vaccine. One dose is recommended after age 27.  Pneumococcal polysaccharide (PPSV23) vaccine. One dose is recommended after age 23. Talk to your health care provider about which screenings and vaccines you need and how often you need them. This information is not intended to replace advice given to you by your health care provider. Make sure you discuss any questions you have with your health care provider. Document Released: 04/07/2015 Document Revised: 11/29/2015 Document Reviewed: 01/10/2015 Elsevier Interactive Patient Education  2017 [place] Prevention in the Home Falls can cause injuries. They can happen to people of all ages. There are many things you can do to make your home safe and to help prevent falls. What can I do on the outside of my home?  Regularly fix the edges of walkways and driveways and fix any cracks.  Remove anything that might make you trip as you walk through a door, such as a raised step or threshold.  Trim any bushes or trees on the path to your home.  Use bright outdoor lighting.  Clear any walking paths of anything that might make someone trip, such as rocks or tools.  Regularly check to see if handrails are loose or broken. Make sure that both sides of any steps have handrails.  Any raised decks and porches should have guardrails on the edges.  Have any leaves, snow, or ice cleared regularly.  Use sand or salt on walking paths during winter.  Clean up any spills in your garage right away. This includes oil or grease spills. What can I do in the bathroom?  Use night lights.  Install grab bars by the toilet and in the tub and shower. Do not use towel bars as grab bars.  Use non-skid mats or decals in the tub or shower.  If you need to sit down in the shower, use a plastic, non-slip stool.  Keep the floor dry. Clean up any water that spills on the  floor as soon as it happens.  Remove soap buildup in the tub or shower regularly.  Attach bath mats securely with double-sided non-slip rug tape.  Do not have throw rugs and other things on the floor that can make you trip. What can I do in the bedroom?  Use night lights.  Make sure that you have a light by your bed that is easy to reach.  Do not use any sheets or blankets that are too big for your bed. They should not hang down onto the floor.  Have a firm chair that has side arms. You can use this for support while you get dressed.  Do not have throw rugs and other things on the floor that can make you trip. What can I do in the kitchen?  Clean up any spills right away.  Avoid walking on wet floors.  Keep items that you use a lot in easy-to-reach places.  If you need to reach something above you, use a strong step stool that has a grab bar.  Keep electrical cords out of the way.  Do not use floor polish or wax that makes floors slippery. If you must  use wax, use non-skid floor wax.  Do not have throw rugs and other things on the floor that can make you trip. What can I do with my stairs?  Do not leave any items on the stairs.  Make sure that there are handrails on both sides of the stairs and use them. Fix handrails that are broken or loose. Make sure that handrails are as long as the stairways.  Check any carpeting to make sure that it is firmly attached to the stairs. Fix any carpet that is loose or worn.  Avoid having throw rugs at the top or bottom of the stairs. If you do have throw rugs, attach them to the floor with carpet tape.  Make sure that you have a light switch at the top of the stairs and the bottom of the stairs. If you do not have them, ask someone to add them for you. What else can I do to help prevent falls?  Wear shoes that:  Do not have high heels.  Have rubber bottoms.  Are comfortable and fit you well.  Are closed at the toe. Do not wear  sandals.  If you use a stepladder:  Make sure that it is fully opened. Do not climb a closed stepladder.  Make sure that both sides of the stepladder are locked into place.  Ask someone to hold it for you, if possible.  Clearly mark and make sure that you can see:  Any grab bars or handrails.  First and last steps.  Where the edge of each step is.  Use tools that help you move around (mobility aids) if they are needed. These include:  Canes.  Walkers.  Scooters.  Crutches.  Turn on the lights when you go into a dark area. Replace any light bulbs as soon as they burn out.  Set up your furniture so you have a clear path. Avoid moving your furniture around.  If any of your floors are uneven, fix them.  If there are any pets around you, be aware of where they are.  Review your medicines with your doctor. Some medicines can make you feel dizzy. This can increase your chance of falling. Ask your doctor what other things that you can do to help prevent falls. This information is not intended to replace advice given to you by your health care provider. Make sure you discuss any questions you have with your health care provider. Document Released: 01/05/2009 Document Revised: 08/17/2015 Document Reviewed: 04/15/2014 Elsevier Interactive Patient Education  2017 [employer].

## 2019-08-10 NOTE — Assessment & Plan Note (Signed)
Well controlled Followed by psych Check CBC, lipid panel, A1c given Seroquel use

## 2019-08-10 NOTE — Assessment & Plan Note (Signed)
Reviewed last lipid panel Not currently on a statin Goal LDL <130 Recheck FLP and CMP Discussed diet and exercise

## 2019-08-10 NOTE — Patient Instructions (Signed)
The CDC recommends two doses of Shingrix (the shingles vaccine) separated by 2 to 6 months for adults age 73 years and older. I recommend checking with your insurance plan regarding coverage for this vaccine.    Preventive Care 34 Years and Older, Female Preventive care refers to lifestyle choices and visits with your health care provider that can promote health and wellness. This includes:  A yearly physical exam. This is also called an annual well check.  Regular dental and eye exams.  Immunizations.  Screening for certain conditions.  Healthy lifestyle choices, such as diet and exercise. What can I expect for my preventive care visit? Physical exam Your health care provider will check:  Height and weight. These may be used to calculate body mass index (BMI), which is a measurement that tells if you are at a healthy weight.  Heart rate and blood pressure.  Your skin for abnormal spots. Counseling Your health care provider may ask you questions about:  Alcohol, tobacco, and drug use.  Emotional well-being.  Home and relationship well-being.  Sexual activity.  Eating habits.  History of falls.  Memory and ability to understand (cognition).  Work and work Statistician.  Pregnancy and menstrual history. What immunizations do I need?  Influenza (flu) vaccine  This is recommended every year. Tetanus, diphtheria, and pertussis (Tdap) vaccine  You may need a Td booster every 10 years. Varicella (chickenpox) vaccine  You may need this vaccine if you have not already been vaccinated. Zoster (shingles) vaccine  You may need this after age 79. Pneumococcal conjugate (PCV13) vaccine  One dose is recommended after age 2. Pneumococcal polysaccharide (PPSV23) vaccine  One dose is recommended after age 32. Measles, mumps, and rubella (MMR) vaccine  You may need at least one dose of MMR if you were born in 1957 or later. You may also need a second  dose. Meningococcal conjugate (MenACWY) vaccine  You may need this if you have certain conditions. Hepatitis A vaccine  You may need this if you have certain conditions or if you travel or work in places where you may be exposed to hepatitis A. Hepatitis B vaccine  You may need this if you have certain conditions or if you travel or work in places where you may be exposed to hepatitis B. Haemophilus influenzae type b (Hib) vaccine  You may need this if you have certain conditions. You may receive vaccines as individual doses or as more than one vaccine together in one shot (combination vaccines). Talk with your health care provider about the risks and benefits of combination vaccines. What tests do I need? Blood tests  Lipid and cholesterol levels. These may be checked every 5 years, or more frequently depending on your overall health.  Hepatitis C test.  Hepatitis B test. Screening  Lung cancer screening. You may have this screening every year starting at age 9 if you have a 30-pack-year history of smoking and currently smoke or have quit within the past 15 years.  Colorectal cancer screening. All adults should have this screening starting at age 17 and continuing until age 85. Your health care provider may recommend screening at age 67 if you are at increased risk. You will have tests every 1-10 years, depending on your results and the type of screening test.  Diabetes screening. This is done by checking your blood sugar (glucose) after you have not eaten for a while (fasting). You may have this done every 1-3 years.  Mammogram. This may be  done every 1-2 years. Talk with your health care provider about how often you should have regular mammograms.  BRCA-related cancer screening. This may be done if you have a family history of breast, ovarian, tubal, or peritoneal cancers. Other tests  Sexually transmitted disease (STD) testing.  Bone density scan. This is done to screen for  osteoporosis. You may have this done starting at age 76. Follow these instructions at home: Eating and drinking  Eat a diet that includes fresh fruits and vegetables, whole grains, lean protein, and low-fat dairy products. Limit your intake of foods with high amounts of sugar, saturated fats, and salt.  Take vitamin and mineral supplements as recommended by your health care provider.  Do not drink alcohol if your health care provider tells you not to drink.  If you drink alcohol: ? Limit how much you have to 0-1 drink a day. ? Be aware of how much alcohol is in your drink. In the [place] one drink equals one 12 oz bottle of beer (355 mL), one 5 oz glass of wine (148 mL), or one 1 oz glass of hard liquor (44 mL). Lifestyle  Take daily care of your teeth and gums.  Stay active. Exercise for at least 30 minutes on 5 or more days each week.  Do not use any products that contain nicotine or tobacco, such as cigarettes, e-cigarettes, and chewing tobacco. If you need help quitting, ask your health care provider.  If you are sexually active, practice safe sex. Use a condom or other form of protection in order to prevent STIs (sexually transmitted infections).  Talk with your health care provider about taking a low-dose aspirin or statin. What's next?  Go to your health care provider once a year for a well check visit.  Ask your health care provider how often you should have your eyes and teeth checked.  Stay up to date on all vaccines. This information is not intended to replace advice given to you by your health care provider. Make sure you discuss any questions you have with your health care provider. Document Revised: 03/05/2018 Document Reviewed: 03/05/2018 Elsevier Patient Education  2020 [employer].

## 2019-08-10 NOTE — Assessment & Plan Note (Signed)
Chronic, stable, mild No acute exacerbation Continue albuterol prn

## 2019-08-10 NOTE — Assessment & Plan Note (Signed)
Well controlled Continue current medications Recheck metabolic panel F/u in 6 months  

## 2019-08-10 NOTE — Assessment & Plan Note (Signed)
Previously well controlled Continue Synthroid at current dose  Recheck TSH and adjust Synthroid as indicated   

## 2019-08-10 NOTE — Assessment & Plan Note (Signed)
Well controlled Followed by psych

## 2019-08-11 ENCOUNTER — Ambulatory Visit: Payer: Medicare Other | Admitting: Physical Therapy

## 2019-08-11 ENCOUNTER — Telehealth: Payer: Self-pay

## 2019-08-11 LAB — LIPID PANEL
Chol/HDL Ratio: 3.8 ratio (ref 0.0–4.4)
Cholesterol, Total: 214 mg/dL — ABNORMAL HIGH (ref 100–199)
HDL: 57 mg/dL (ref >39)
LDL Chol Calc (NIH): 132 mg/dL — ABNORMAL HIGH (ref 0–99)
Triglycerides: 141 mg/dL (ref 0–149)
VLDL Cholesterol Cal: 25 mg/dL (ref 5–40)

## 2019-08-11 LAB — CBC WITH DIFFERENTIAL/PLATELET
Basophils Absolute: 0 10*3/uL (ref 0.0–0.2)
Basos: 1 %
EOS (ABSOLUTE): 0.2 10*3/uL (ref 0.0–0.4)
Eos: 3 %
Hematocrit: 33.2 % — ABNORMAL LOW (ref 34.0–46.6)
Hemoglobin: 11.5 g/dL (ref 11.1–15.9)
Immature Grans (Abs): 0 10*3/uL (ref 0.0–0.1)
Immature Granulocytes: 0 %
Lymphocytes Absolute: 2 10*3/uL (ref 0.7–3.1)
Lymphs: 42 %
MCH: 32.8 pg (ref 26.6–33.0)
MCHC: 34.6 g/dL (ref 31.5–35.7)
MCV: 95 fL (ref 79–97)
Monocytes Absolute: 0.6 10*3/uL (ref 0.1–0.9)
Monocytes: 13 %
Neutrophils Absolute: 1.9 10*3/uL (ref 1.4–7.0)
Neutrophils: 41 %
Platelets: 211 10*3/uL (ref 150–450)
RBC: 3.51 x10E6/uL — ABNORMAL LOW (ref 3.77–5.28)
RDW: 11.5 % — ABNORMAL LOW (ref 11.7–15.4)
WBC: 4.8 10*3/uL (ref 3.4–10.8)

## 2019-08-11 LAB — COMPREHENSIVE METABOLIC PANEL
ALT: 13 IU/L (ref 0–32)
AST: 24 IU/L (ref 0–40)
Albumin/Globulin Ratio: 1.3 (ref 1.2–2.2)
Albumin: 4 g/dL (ref 3.7–4.7)
Alkaline Phosphatase: 117 IU/L (ref 48–121)
BUN/Creatinine Ratio: 16 (ref 12–28)
BUN: 17 mg/dL (ref 8–27)
Bilirubin Total: 0.3 mg/dL (ref 0.0–1.2)
CO2: 25 mmol/L (ref 20–29)
Calcium: 8.9 mg/dL (ref 8.7–10.3)
Chloride: 97 mmol/L (ref 96–106)
Creatinine, Ser: 1.04 mg/dL — ABNORMAL HIGH (ref 0.57–1.00)
GFR calc Af Amer: 62 mL/min/{1.73_m2} (ref >59)
GFR calc non Af Amer: 53 mL/min/{1.73_m2} — ABNORMAL LOW (ref >59)
Globulin, Total: 3 g/dL (ref 1.5–4.5)
Glucose: 86 mg/dL (ref 65–99)
Potassium: 3.9 mmol/L (ref 3.5–5.2)
Sodium: 134 mmol/L (ref 134–144)
Total Protein: 7 g/dL (ref 6.0–8.5)

## 2019-08-11 LAB — HEMOGLOBIN A1C
Est. average glucose Bld gHb Est-mCnc: 108 mg/dL
Hgb A1c MFr Bld: 5.4 % (ref 4.8–5.6)

## 2019-08-11 LAB — TSH: TSH: 1.6 u[IU]/mL (ref 0.450–4.500)

## 2019-08-11 NOTE — Telephone Encounter (Signed)
-----   Message from Virginia Crews, MD sent at 08/11/2019 12:57 PM EDT ----- Normal/stable labs

## 2019-08-11 NOTE — Telephone Encounter (Signed)
Patient advised as below.  

## 2019-08-16 ENCOUNTER — Ambulatory Visit: Payer: Medicare Other | Admitting: Physical Therapy

## 2019-08-18 ENCOUNTER — Ambulatory Visit: Payer: Medicare Other | Admitting: Physical Therapy

## 2019-09-10 DIAGNOSIS — I2089 Other forms of angina pectoris: Secondary | ICD-10-CM | POA: Insufficient documentation

## 2019-09-10 DIAGNOSIS — N1831 Chronic kidney disease, stage 3a: Secondary | ICD-10-CM | POA: Insufficient documentation

## 2019-09-10 DIAGNOSIS — N183 Chronic kidney disease, stage 3 unspecified: Secondary | ICD-10-CM | POA: Insufficient documentation

## 2019-09-10 DIAGNOSIS — I208 Other forms of angina pectoris: Secondary | ICD-10-CM | POA: Insufficient documentation

## 2019-09-21 ENCOUNTER — Telehealth: Payer: Self-pay

## 2019-09-21 ENCOUNTER — Other Ambulatory Visit: Payer: Self-pay

## 2019-09-21 ENCOUNTER — Ambulatory Visit (INDEPENDENT_AMBULATORY_CARE_PROVIDER_SITE_OTHER): Payer: Medicare Other | Admitting: Adult Health

## 2019-09-21 ENCOUNTER — Encounter: Payer: Self-pay | Admitting: Adult Health

## 2019-09-21 VITALS — BP 160/70 | HR 68 | Temp 97.2°F | Resp 16 | Wt 192.8 lb

## 2019-09-21 DIAGNOSIS — T50905A Adverse effect of unspecified drugs, medicaments and biological substances, initial encounter: Secondary | ICD-10-CM | POA: Diagnosis not present

## 2019-09-21 DIAGNOSIS — R21 Rash and other nonspecific skin eruption: Secondary | ICD-10-CM | POA: Diagnosis not present

## 2019-09-21 NOTE — Patient Instructions (Signed)
Rash, Adult A rash is a change in the color of your skin. A rash can also change the way your skin feels. There are many different conditions and factors that can cause a rash. Some rashes may disappear after a few days, but some may last for a few weeks. Common causes of rashes include:  Viral infections, such as: ? Colds. ? Measles. ? Hand, foot, and mouth disease.  Bacterial infections, such as: ? Scarlet fever. ? Impetigo.  Fungal infections, such as Candida.  Allergic reactions to food, medicines, or skin care products. Follow these instructions at home: The goal of treatment is to stop the itching and keep the rash from spreading. Pay attention to any changes in your symptoms. Follow these instructions to help with your condition: Medicine Take or apply over-the-counter and prescription medicines only as told by your health care provider. These may include:  Corticosteroid creams to treat red or swollen skin.  Anti-itch lotions.  Oral allergy medicines (antihistamines).  Oral corticosteroids for severe symptoms.  Skin care  Apply cool compresses to the affected areas.  [place] not scratch or rub your skin.  Avoid covering the rash. Make sure the rash is exposed to air as much as possible. Managing itching and discomfort  Avoid hot showers or baths, which can make itching worse. A cold shower may help.  Try taking a bath with: ? Epsom salts. Follow manufacturer instructions on the packaging. You can get these at your local pharmacy or grocery store. ? Baking soda. Pour a small amount into the bath as told by your health care provider. ? Colloidal oatmeal. Follow manufacturer instructions on the packaging. You can get this at your local pharmacy or grocery store.  Try applying baking soda paste to your skin. Stir water into baking soda until it reaches a paste-like consistency.  Try applying calamine lotion. This is an over-the-counter lotion that helps to relieve  itchiness.  Keep cool and out of the sun. Sweating and being hot can make itching worse. General instructions   Rest as needed.  Drink enough fluid to keep your urine pale yellow.  Wear loose-fitting clothing.  Avoid scented soaps, detergents, and perfumes. Use gentle soaps, detergents, perfumes, and other cosmetic products.  Avoid any substance that causes your rash. Keep a journal to help track what causes your rash. Write down: ? What you eat. ? What cosmetic products you use. ? What you drink. ? What you wear. This includes jewelry.  Keep all follow-up visits as told by your health care provider. This is important. Contact a health care provider if:  You sweat at night.  You lose weight.  You urinate more than normal.  You urinate less than normal, or you notice that your urine is a darker color than usual.  You feel weak.  You vomit.  Your skin or the whites of your eyes look yellow (jaundice).  Your skin: ? Tingles. ? Is numb.  Your rash: ? Does not go away after several days. ? Gets worse.  You are: ? Unusually thirsty. ? More tired than normal.  You have: ? New symptoms. ? Pain in your abdomen. ? A fever. ? Diarrhea. Get help right away if you:  Have a fever and your symptoms suddenly get worse.  Develop confusion.  Have a severe headache or a stiff neck.  Have severe joint pains or stiffness.  Have a seizure.  Develop a rash that covers all or most of your body. The rash may   or may not be painful.  Develop blisters that: ? Are on top of the rash. ? Grow larger or grow together. ? Are painful. ? Are inside your nose or mouth.  Develop a rash that: ? Looks like purple pinprick-sized spots all over your body. ? Has a "bull's eye" or looks like a target. ? Is not related to sun exposure, is red and painful, and causes your skin to peel. Summary  A rash is a change in the color of your skin. Some rashes disappear after a few days,  but some may last for a few weeks.  The goal of treatment is to stop the itching and keep the rash from spreading.  Take or apply over-the-counter and prescription medicines only as told by your health care provider.  Contact a health care provider if you have new or worsening symptoms.  Keep all follow-up visits as told by your health care provider. This is important. This information is not intended to replace advice given to you by your health care provider. Make sure you discuss any questions you have with your health care provider. Document Revised: 07/03/2018 Document Reviewed: 10/13/2017 Elsevier Patient Education  [place]. Drug Rash  A drug rash occurs when a medicine causes a change in the color or texture of the skin. It can develop minutes, hours, or days after you take the medicine. The rash may appear on a small area of skin or all over your body. What are the causes? This condition is usually caused by your body's reaction (allergy) to a medicine. It can also be caused by exposure to sunlight after taking a medicine that makes your skin sensitive to light. Though any medicine can cause a rash or reaction, medicines that are more likely to cause rashes include:  Penicillin.  Antibiotic medicines.  Medicines that treat seizures.  Medicines that treat cancer (chemotherapy).  Aspirin and other NSAIDs.  Injectable dyes that contain iodine.  Insulin. What are the signs or symptoms? Symptoms of this condition include:  Redness.  Tiny bumps.  Peeling.  Itching.  Itchy welts (hives).  Swelling. How is this diagnosed? This condition may be diagnosed based on:  A physical exam.  Tests to find out which medicine caused the rash. These tests may include: ? Skin tests. ? Blood tests. ? Challenge test. For this test, you stop taking all the medicines that you do not need to take. Then, you start taking them again by adding back one medicine at a  time. How is this treated? This condition is treated with medicines, including:  Antihistamine. This may be given to relieve itching.  NSAIDs. These may be given to reduce swelling and to treat pain.  A steroid medicine. This may be given to reduce swelling. The rash usually goes away when you stop taking the medicine that caused it. Follow these instructions at home:  Take over-the-counter and prescription medicines only as told by your health care provider.  Tell all your health care providers about any medicine reactions that you have had in the past.  If your rash was caused by sensitivity to sunlight, and while your rash is healing: ? Avoid being in the sun if possible, especially when it is strongest, usually between 10 a.m. and 4 p.m. ? Cover your skin with pants, long sleeves, and a hat when you are exposed to sunlight.  If you have hives: ? Take a cool shower or use a cool compress to relieve itchiness. ? Take  over-the-counter antihistamines, as recommended by your health care provider, until the hives are gone. Hives are not contagious.  Keep all follow-up visits as told by your health care provider. This is important. Contact a health care provider if you have:  A fever.  A rash that is not going away.  A rash that gets worse.  A rash that comes back.  Wheezing or coughing. Get help right away if:  You start to have breathing problems.  You start to have shortness of breath.  Your face or throat starts to swell.  You have severe weakness with dizziness or fainting.  You have chest pain. These symptoms may represent a serious problem that is an emergency. Do not wait to see if the symptoms will go away. Get medical help right away. Call your local emergency services (911 in the [place] Do not drive yourself to the hospital. Summary  A drug rash occurs when a medicine causes a change in the color or texture of the skin. The rash may appear on a small area of  skin or all over your body.  It can develop minutes, hours, or days after you take the medicine.  Your health care provider will do various tests to determine what medicine caused your rash.  The rash may be treated with medicine to relieve itching, swelling, and pain. This information is not intended to replace advice given to you by your health care provider. Make sure you discuss any questions you have with your health care provider. Document Revised: 02/21/2017 Document Reviewed: 01/30/2017 Elsevier Patient Education  2020 [employer].

## 2019-09-21 NOTE — Progress Notes (Signed)
Acute Office Visit  Subjective:    Patient ID: Wanda Ochoa, female    DOB: Oct 10, 1945, 75 y.o.   MRN: [id number]  Chief Complaint  Patient presents with  . Rash    Rash This is a new problem. The current episode started yesterday. The affected locations include the left ankle, left foot, left lower leg, left upper leg, right upper leg, right lower leg, right ankle and right foot. The rash is characterized by redness. She was exposed to nothing. Pertinent negatives include no anorexia, congestion, cough, diarrhea, eye pain, facial edema, fatigue, fever, joint pain, nail changes, rhinorrhea, shortness of breath, sore throat or vomiting. Past treatments include nothing.   Recently started lipitor. On 09/10/19. Denies any new or other changes to her medications or dailiy regimen. Denies any recent illness.   Onset of this was noticed yesterday evening after walking around at hospital after volunteering- when she got home she notoiced her lower legs were red and she has one area in her upper left thigh.   Denies any injury or falls,or tick bites, spider bites. Denies having this in past.   Patient  denies any fever, body aches,chills, chest pain, shortness of breath, nausea, vomiting, or diarrhea. Denies any muscle aches or fatigue. " feels well I just noticed rash ".   Denies any excessive NSAID use she is on ASA 81mg  daily. Denies any other blood thinners.   Denies dizziness, lightheadedness, pre syncopal or syncopal episodes.   Past Medical History:  Diagnosis Date  . Anxiety   . Arthritis    right knee  . Asthma   . HTN (hypertension)   . Thyroid disease     Past Surgical History:  Procedure Laterality Date  . ABDOMINAL HYSTERECTOMY  1990  . BLADDER SUSPENSION  1990  . BREAST CYST ASPIRATION Right 1985  . BREAST EXCISIONAL BIOPSY Right 1967   EXCISIONAL BX - NEG  . BREAST SURGERY Right 1967   Lumpectomy  . CESAREAN SECTION    . COLONOSCOPY WITH PROPOFOL  N/A 12/18/2018   Procedure: COLONOSCOPY WITH PROPOFOL;  Surgeon: Lollie Sails, MD;  Location: [facility] ENDOSCOPY;  Service: Endoscopy;  Laterality: N/A;  . HEMORRHOIDECTOMY WITH HEMORRHOID BANDING  1988    Family History  Problem Relation Age of Onset  . Depression Father   . Suicidality Father   . Depression Sister   . Breast cancer Sister 62  . Autism Son   . Heart disease Mother   . Colon cancer Mother   . Hypertension Mother   . Anxiety disorder Mother   . Mental illness Daughter   . Allergies Brother   . Allergies Sister   . Asthma Maternal Uncle   . Heart disease Maternal Grandfather   . Heart disease Maternal Grandmother   . Breast cancer Paternal Grandmother   . Breast cancer Other     Social History   Socioeconomic History  . Marital status: Married    Spouse name: Komal Stangelo  . Number of children: 3  . Years of education: 16  . Highest education level: Bachelor's degree (e.g., BA, AB, BS)  Occupational History  . 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 Smoking status: Never Smoker  . Smokeless tobacco: Never Used  Vaping Use  . Vaping Use: Never used  Substance and Sexual Activity  . Alcohol use: No  . Drug use: No  . Sexual  activity: Not on file  Other Topics Concern  . Not on file  Social History Narrative  . Not on file   Social Determinants of Health   Financial Resource Strain: Low Risk   . Difficulty of Paying Living Expenses: Not hard at all  Food Insecurity: No Food Insecurity  . Worried About Charity fundraiser in the Last Year: Never true  . Ran Out of Food in the Last Year: Never true  Transportation Needs: No Transportation Needs  . Lack of Transportation (Medical): No  . Lack of Transportation (Non-Medical): No  Physical Activity: Sufficiently Active  . Days of Exercise per Week: 4 days  . Minutes of Exercise per Session: 60 min  Stress: Stress  Concern Present  . Feeling of Stress : To some extent  Social Connections: Moderately Integrated  . Frequency of Communication with Friends and Family: Twice a week  . Frequency of Social Gatherings with Friends and Family: More than three times a week  . Attends Religious Services: Never  . Active Member of Clubs or Organizations: Yes  . Attends Archivist Meetings: More than 4 times per year  . Marital Status: Married  Human resources officer Violence: Not At Risk  . Fear of Current or Ex-Partner: No  . Emotionally Abused: No  . Physically Abused: No  . Sexually Abused: No    Outpatient Medications Prior to Visit  Medication Sig Dispense Refill  . albuterol (VENTOLIN HFA) 108 (90 Base) MCG/ACT inhaler INHALE 2 PUFFS INTO THE LUNGS EVERY 6 HOURS AS NEEDED 18 g 4  . amLODipine (NORVASC) 5 MG tablet Take 5 mg by mouth 2 (two) times daily.     Marland Kitchen aspirin 81 MG tablet Take 81 mg by mouth daily.    . citalopram (CELEXA) 10 MG tablet Take 10 mg by mouth daily.    . hydrochlorothiazide (HYDRODIURIL) 12.5 MG tablet Take 1 tablet (12.5 mg total) by mouth daily. 90 tablet 3  . hydrOXYzine (ATARAX/VISTARIL) 25 MG tablet Take 25 mg by mouth 4 (four) times daily as needed.    Marland Kitchen KRILL OIL PO Take 1 capsule by mouth daily. Reported on 06/02/2015    . levothyroxine (SYNTHROID) 100 MCG tablet Take 1 tablet (100 mcg total) by mouth daily. 90 tablet 0  . lisinopril (PRINIVIL,ZESTRIL) 20 MG tablet Take 20 mg by mouth 2 (two) times daily.     . metoprolol tartrate (LOPRESSOR) 50 MG tablet Take 1 tablet (50 mg total) by mouth 2 (two) times daily. (Patient taking differently: Take 25 mg by mouth 2 (two) times daily. ) 180 tablet 2  . Multiple Vitamin (MULTIVITAMIN) capsule Take 1 capsule by mouth daily.    . nortriptyline (PAMELOR) 25 MG capsule Take 4 capsule at bed time (Patient taking differently: Take 3 capsule at bed time) 120 capsule 0  . Omega 3-6-9 CAPS Take by mouth daily.     . polyethylene  glycol (MIRALAX / GLYCOLAX) 17 g packet Take 17 g by mouth daily. Mix in 4-8 oz of fluid    . QUEtiapine (SEROQUEL) 100 MG tablet Take 200 mg by mouth at bedtime.     Marland Kitchen atorvastatin (LIPITOR) 10 MG tablet Take by mouth.    Marland Kitchen lisinopril (ZESTRIL) 40 MG tablet Take 20 mg by mouth 2 (two) times daily.    . QUEtiapine (SEROQUEL) 25 MG tablet Take 50 mg by mouth at bedtime.      No facility-administered medications prior to visit.    Allergies  Allergen Reactions  . Penicillins Anaphylaxis    Review of Systems  Constitutional: Negative for fatigue and fever.  HENT: Negative for congestion, rhinorrhea and sore throat.   Eyes: Negative for pain.  Respiratory: Negative for cough and shortness of breath.   Gastrointestinal: Negative for anorexia, diarrhea and vomiting.  Musculoskeletal: Negative for joint pain.  Skin: Positive for rash. Negative for nail changes.  Media Information   Document Information  Photos  Right lower leg anterior and medial side   09/21/2019 14:06  Attached To:  Office Visit on 09/21/19 with Juliet Vasbinder, Kelby Aline, FNP  Source Information  Kaylina Cahue, Kelby Aline, FNP  [facility]   Media Information   Document Information  Photos  Left lower leg   09/21/2019 14:06  Attached To:  Office Visit on 09/21/19 with Davell Beckstead, Kelby Aline, FNP  Source Information  Jerin Franzel, Kelby Aline, FNP  [facility]   Media Information   Document Information  Photos  Left lateral thigh above knee   09/21/2019 14:05  Attached To:  Office Visit on 09/21/19 with Ramona Slinger, Kelby Aline, FNP  Source Information  Valicia Rief, Kelby Aline, FNP  [facility]   Media Information   Document Information  Photos  Left and right anterior lower legs   09/21/2019 14:04  Attached To:  Office Visit on 09/21/19 with Ezrie Bunyan, Kelby Aline, FNP  Source Information  Velmer Woelfel, Kelby Aline, FNP  [facility]        Objective:   Marland Kitchen Vitals with  BMI 09/21/2019 08/10/2019 08/10/2019  Height - 5\' 9"  -  Weight 192 lbs 13 oz 187 lbs -  BMI - 41.7 -  Systolic 408 144 818  Diastolic 70 62 62  Pulse 68 75 -  Some encounter information is confidential and restricted. Go to Review Flowsheets activity to see all data.   Physical Exam Constitutional:      General: She is not in acute distress.    Appearance: Normal appearance. She is not ill-appearing, toxic-appearing or diaphoretic.  HENT:     Head: Normocephalic and atraumatic.     Right Ear: There is no impacted cerumen.     Left Ear: There is no impacted cerumen.     Nose: Nose normal. No congestion or rhinorrhea.     Mouth/Throat:     Mouth: Mucous membranes are moist.     Pharynx: No oropharyngeal exudate.  Eyes:     Extraocular Movements: Extraocular movements intact.     Pupils: Pupils are equal, round, and reactive to light.  Neck:     Vascular: No carotid bruit.  Cardiovascular:     Rate and Rhythm: Normal rate and regular rhythm.     Pulses: Normal pulses.          Dorsalis pedis pulses are 2+ on the right side and 2+ on the left side.       Posterior tibial pulses are 2+ on the right side and 2+ on the left side.     Heart sounds: Normal heart sounds. No murmur heard.  No friction rub. No gallop.   Pulmonary:     Effort: Pulmonary effort is normal. No respiratory distress.     Breath sounds: Normal breath sounds. No stridor. No wheezing, rhonchi or rales.  Chest:     Chest wall: No tenderness.  Abdominal:     General: Bowel sounds are normal.     Palpations: Abdomen is soft.  Musculoskeletal:        General:  Normal range of motion.     Cervical back: Normal range of motion and neck supple. No rigidity or tenderness.  Feet:     Right foot:     Protective Sensation: 8 sites tested. 8 sites sensed.     Left foot:     Protective Sensation: 8 sites tested. 8 sites sensed.  Lymphadenopathy:     Cervical: No cervical adenopathy.     Lower Body: No right inguinal  adenopathy. No left inguinal adenopathy.  Skin:    General: Skin is warm.     Capillary Refill: Capillary refill takes less than 2 seconds.     Findings: Erythema (see media - rash sightly warm in areas, mostly anterior bilateral legs lower and one area upper anterior thigh left and right foot  as captured in photograph. no pain. No tenderness. No drainage.  pedal pulses bilaterally 2+ no rash elsewhere) and rash present.  Neurological:     General: No focal deficit present.     Mental Status: She is alert and oriented to person, place, and time.     Motor: No weakness.     Coordination: Coordination normal.     Gait: Gait normal.     Deep Tendon Reflexes: Reflexes normal.  Psychiatric:        Mood and Affect: Mood normal.        Behavior: Behavior normal.        Thought Content: Thought content normal.        Judgment: Judgment normal.     BP (!) 160/70   Pulse 68   Temp (!) 97.2 F (36.2 C) (Temporal)   Resp 16   Wt 192 lb 12.8 oz (87.5 kg)   SpO2 96%   BMI 28.47 kg/m  Wt Readings from Last 3 Encounters:  09/21/19 192 lb 12.8 oz (87.5 kg)  08/10/19 187 lb (84.8 kg)  08/10/19 187 lb (84.8 kg)    There are no preventive care reminders to display for this patient.  There are no preventive care reminders to display for this patient.   Lab Results  Component Value Date   TSH 1.600 08/10/2019   Lab Results  Component Value Date   WBC 7.1 09/21/2019   HGB 11.9 09/21/2019   HCT 34.8 09/21/2019   MCV 95 09/21/2019   PLT 223 09/21/2019   Lab Results  Component Value Date   NA 133 (L) 09/21/2019   K 4.3 09/21/2019   CO2 29 09/21/2019   GLUCOSE 87 09/21/2019   BUN 12 09/21/2019   CREATININE 1.12 (H) 09/21/2019   BILITOT 0.4 09/21/2019   ALKPHOS 120 09/21/2019   AST 27 09/21/2019   ALT 17 09/21/2019   PROT 7.4 09/21/2019   ALBUMIN 4.3 09/21/2019   CALCIUM 8.9 09/21/2019   ANIONGAP 11 12/23/2018   Lab Results  Component Value Date   CHOL 214 (H) 08/10/2019     Lab Results  Component Value Date   HDL 57 08/10/2019   Lab Results  Component Value Date   LDLCALC 132 (H) 08/10/2019   Lab Results  Component Value Date   TRIG 141 08/10/2019   Lab Results  Component Value Date   CHOLHDL 3.8 08/10/2019   Lab Results  Component Value Date   HGBA1C 5.4 08/10/2019       Assessment & Plan:   Problem List Items Addressed This Visit    None    Visit Diagnoses    Adverse effect of drug, initial encounter    -  Primary   Rash and nonspecific skin eruption       Relevant Orders   CBC with Differential/Platelet (Completed)   Comprehensive Metabolic Panel (CMET) (Completed)   CK (Creatine Kinase) (Completed)     Questionable fixed drug reaction rash to Lipitor - recent start on Statin, 09/10/19. No other associated symptoms, rash is mildly warn in some areas. Denies any pain, myalgias or other systemic symptoms.   Other differentials include erysipelas, or cellulitis however rash is bilateral and sudden onset. Not consistent with definitive purpura.  Will check labs.   Orders Placed This Encounter  Procedures  . CBC with Differential/Platelet  . Comprehensive Metabolic Panel (CMET)  . CK (Creatine Kinase)    Discontinue Lipitor today for now.  Will not give Prednisone or other medications - will check labs first. She will return for recheck in two days and call sooner if any systemic symptoms ot worsening symptoms. Red flags discussed.   Provider consulted with Dr. Rosanna Randy supervising MD  who is in agreement with plan and came into the room to evaluate patient with provider.  Return in about 2 days (around 09/23/2019), or if symptoms worsen or fail to improve, for Go to Emergency room/ urgent care if worse, at any time for any worsening symptoms.  Advised patient call the office or your primary care doctor for an appointment if no improvement within 72 hours or if any symptoms change or worsen at any time  Advised ER or urgent Care if  after hours or on weekend. Call 911 for emergency symptoms at any time.Patinet verbalized understanding of all instructions given/reviewed and treatment plan and has no further questions or concerns at this time.     Provider thoroughly discussed in collaboration above plan with supervising physician Dr. Miguel Aschoff who is in agreement with the care plan as above.   IWellington [place] Aggie Douse, FNP, have reviewed all documentation for this visit. The documentation on 09/22/19 for the exam, diagnosis, procedures, and orders are all accurate and complete. Marcille Buffy, FNP

## 2019-09-21 NOTE — Telephone Encounter (Signed)
Appointment made  Copied from [place] [phone]. Topic: Appointment Scheduling - Scheduling Inquiry for Clinic >> Sep 21, 2019  9:04 AM Mathis Bud wrote: Reason for CRM: Patient is requesting a same day appt due to rash on her legs.  Patient states it does not itch just red on both legs Call back [phone]

## 2019-09-22 ENCOUNTER — Telehealth: Payer: Self-pay

## 2019-09-22 LAB — COMPREHENSIVE METABOLIC PANEL
ALT: 17 IU/L (ref 0–32)
AST: 27 IU/L (ref 0–40)
Albumin/Globulin Ratio: 1.4 (ref 1.2–2.2)
Albumin: 4.3 g/dL (ref 3.7–4.7)
Alkaline Phosphatase: 120 IU/L (ref 48–121)
BUN/Creatinine Ratio: 11 — ABNORMAL LOW (ref 12–28)
BUN: 12 mg/dL (ref 8–27)
Bilirubin Total: 0.4 mg/dL (ref 0.0–1.2)
CO2: 29 mmol/L (ref 20–29)
Calcium: 8.9 mg/dL (ref 8.7–10.3)
Chloride: 93 mmol/L — ABNORMAL LOW (ref 96–106)
Creatinine, Ser: 1.12 mg/dL — ABNORMAL HIGH (ref 0.57–1.00)
GFR calc Af Amer: 56 mL/min/{1.73_m2} — ABNORMAL LOW (ref >59)
GFR calc non Af Amer: 49 mL/min/{1.73_m2} — ABNORMAL LOW (ref >59)
Globulin, Total: 3.1 g/dL (ref 1.5–4.5)
Glucose: 87 mg/dL (ref 65–99)
Potassium: 4.3 mmol/L (ref 3.5–5.2)
Sodium: 133 mmol/L — ABNORMAL LOW (ref 134–144)
Total Protein: 7.4 g/dL (ref 6.0–8.5)

## 2019-09-22 LAB — CBC WITH DIFFERENTIAL/PLATELET
Basophils Absolute: 0.1 10*3/uL (ref 0.0–0.2)
Basos: 1 %
EOS (ABSOLUTE): 0.2 10*3/uL (ref 0.0–0.4)
Eos: 2 %
Hematocrit: 34.8 % (ref 34.0–46.6)
Hemoglobin: 11.9 g/dL (ref 11.1–15.9)
Immature Grans (Abs): 0 10*3/uL (ref 0.0–0.1)
Immature Granulocytes: 0 %
Lymphocytes Absolute: 2.6 10*3/uL (ref 0.7–3.1)
Lymphs: 37 %
MCH: 32.4 pg (ref 26.6–33.0)
MCHC: 34.2 g/dL (ref 31.5–35.7)
MCV: 95 fL (ref 79–97)
Monocytes Absolute: 0.7 10*3/uL (ref 0.1–0.9)
Monocytes: 10 %
Neutrophils Absolute: 3.6 10*3/uL (ref 1.4–7.0)
Neutrophils: 50 %
Platelets: 223 10*3/uL (ref 150–450)
RBC: 3.67 x10E6/uL — ABNORMAL LOW (ref 3.77–5.28)
RDW: 12.1 % (ref 11.7–15.4)
WBC: 7.1 10*3/uL (ref 3.4–10.8)

## 2019-09-22 LAB — CK: Total CK: 185 U/L — ABNORMAL HIGH (ref 32–182)

## 2019-09-22 NOTE — Progress Notes (Signed)
CBC stable compared to previous.  CMP is stable from last as well, kidney function scantly decreased from previous, mild hydration advised.  Only mild increase in CK, she has no myalgias, recently started statin, it was put on hold/ discontinued on 09/21/19 after reviewing with Dr. Rosanna Randy. Keep follow up office visit on 09/23/19 for recheck,

## 2019-09-22 NOTE — Telephone Encounter (Signed)
Patient has been advised. KW 

## 2019-09-22 NOTE — Telephone Encounter (Signed)
-----   Message from Doreen Beam, [place] sent at 09/22/2019 11:38 AM EDT ----- CBC stable compared to previous.  CMP is stable from last as well, kidney function scantly decreased from previous, mild hydration advised.  Only mild increase in CK, she has no myalgias, recently started statin, it was put on hold/ discontinued on 09/21/19 after reviewing with Dr. Rosanna Randy. Keep follow up office visit on 09/23/19 for recheck,

## 2019-09-23 ENCOUNTER — Encounter: Payer: Self-pay | Admitting: Adult Health

## 2019-09-23 ENCOUNTER — Ambulatory Visit (INDEPENDENT_AMBULATORY_CARE_PROVIDER_SITE_OTHER): Payer: Medicare Other | Admitting: Adult Health

## 2019-09-23 ENCOUNTER — Other Ambulatory Visit: Payer: Self-pay

## 2019-09-23 VITALS — BP 128/73 | HR 75 | Temp 96.9°F | Wt 193.4 lb

## 2019-09-23 DIAGNOSIS — R21 Rash and other nonspecific skin eruption: Secondary | ICD-10-CM

## 2019-09-23 DIAGNOSIS — T50905D Adverse effect of unspecified drugs, medicaments and biological substances, subsequent encounter: Secondary | ICD-10-CM

## 2019-09-23 NOTE — Patient Instructions (Signed)
Drug Rash  A drug rash occurs when a medicine causes a change in the color or texture of the skin. It can develop minutes, hours, or days after you take the medicine. The rash may appear on a small area of skin or all over your body. What are the causes? This condition is usually caused by your body's reaction (allergy) to a medicine. It can also be caused by exposure to sunlight after taking a medicine that makes your skin sensitive to light. Though any medicine can cause a rash or reaction, medicines that are more likely to cause rashes include:  Penicillin.  Antibiotic medicines.  Medicines that treat seizures.  Medicines that treat cancer (chemotherapy).  Aspirin and other NSAIDs.  Injectable dyes that contain iodine.  Insulin. What are the signs or symptoms? Symptoms of this condition include:  Redness.  Tiny bumps.  Peeling.  Itching.  Itchy welts (hives).  Swelling. How is this diagnosed? This condition may be diagnosed based on:  A physical exam.  Tests to find out which medicine caused the rash. These tests may include: ? Skin tests. ? Blood tests. ? Challenge test. For this test, you stop taking all the medicines that you do not need to take. Then, you start taking them again by adding back one medicine at a time. How is this treated? This condition is treated with medicines, including:  Antihistamine. This may be given to relieve itching.  NSAIDs. These may be given to reduce swelling and to treat pain.  A steroid medicine. This may be given to reduce swelling. The rash usually goes away when you stop taking the medicine that caused it. Follow these instructions at home:  Take over-the-counter and prescription medicines only as told by your health care provider.  Tell all your health care providers about any medicine reactions that you have had in the past.  If your rash was caused by sensitivity to sunlight, and while your rash is  healing: ? Avoid being in the sun if possible, especially when it is strongest, usually between 10 a.m. and 4 p.m. ? Cover your skin with pants, long sleeves, and a hat when you are exposed to sunlight.  If you have hives: ? Take a cool shower or use a cool compress to relieve itchiness. ? Take over-the-counter antihistamines, as recommended by your health care provider, until the hives are gone. Hives are not contagious.  Keep all follow-up visits as told by your health care provider. This is important. Contact a health care provider if you have:  A fever.  A rash that is not going away.  A rash that gets worse.  A rash that comes back.  Wheezing or coughing. Get help right away if:  You start to have breathing problems.  You start to have shortness of breath.  Your face or throat starts to swell.  You have severe weakness with dizziness or fainting.  You have chest pain. These symptoms may represent a serious problem that is an emergency. Do not wait to see if the symptoms will go away. Get medical help right away. Call your local emergency services (911 in the [place] Do not drive yourself to the hospital. Summary  A drug rash occurs when a medicine causes a change in the color or texture of the skin. The rash may appear on a small area of skin or all over your body.  It can develop minutes, hours, or days after you take the medicine.  Your health care   provider will do various tests to determine what medicine caused your rash.  The rash may be treated with medicine to relieve itching, swelling, and pain. This information is not intended to replace advice given to you by your health care provider. Make sure you discuss any questions you have with your health care provider. Document Revised: 02/21/2017 Document Reviewed: 01/30/2017 Elsevier Patient Education  2020 [employer]  

## 2019-09-23 NOTE — Progress Notes (Signed)
Established patient visit   Patient: Wanda Ochoa. Machamer   DOB: 02-05-1946   74 y.o. Female  MRN: [id number] Visit Date: 09/23/2019  Today's healthcare provider: Marcille Buffy, FNP   Chief Complaint  Patient presents with  . Rash   I,Latasha Walston,acting as a scribe for [employer], FNP.,have documented all relevant documentation on the behalf of Marcille Buffy, FNP,as directed by  Marcille Buffy, FNP while in the presence of Marcille Buffy, [place].   Subjective    HPI  Follow up for Rash:  The patient was last seen for this 2 days ago. Changes made at last visit include advised to discontinue Lipitor for now. She has done this. She had no other allergic symptoms besides rash, no muscle aches or pains, or fatigue and is still negative for those as well today.  She reports good compliance with treatment. She feels that condition is Improved. She is not having side effects.  Rash is more pink in color today. Denies any pain, itching or burning.   She denied any other new exposure or detergent, bite or any recent illness.   Statin was held/ discontinued two days ago for possible drug reaction. Patient seems to be improving. CK was slightly elevated. CBC and CMP stable from previous. Platelets within normal limits.    Patient  denies any fever, body aches,chills, rash, chest pain, shortness of breath, nausea, vomiting, or diarrhea.  Denies dizziness, lightheadedness, pre syncopal or syncopal episodes.   -----------------------------------------------------------------------------------------    Patient Active Problem List   Diagnosis Date Noted  . Drug reaction, subsequent encounter 09/23/2019  . Rash and nonspecific skin eruption 09/23/2019  . Stable angina pectoris ([place]) 09/10/2019  . Stage 3a chronic kidney disease 09/10/2019  . Idiopathic gout involving toe of right foot 12/07/2018  . Primary osteoarthritis  of right knee 03/07/2017  . Hematuria 09/11/2016  . Venous insufficiency of both lower extremities 09/04/2016  . Severe recurrent major depression without psychotic features ([place])   . Hypercholesteremia 05/02/2015  . Mitral valve prolapse 02/21/2015  . Allergic rhinitis 12/24/2014  . Osteopenia 12/24/2014  . Hypothyroidism 11/24/2014  . Generalized anxiety disorder 02/24/2012  . Nocturnal hypoxemia 01/20/2012  . Abdominal mass 01/20/2012  . Temporary cerebral vascular dysfunction 01/02/2009  . Benign hypertension 03/28/2008  . Asthma, exogenous 10/22/2007  . Adaptive colitis 03/12/2003   Past Medical History:  Diagnosis Date  . Anxiety   . Arthritis    right knee  . Asthma   . HTN (hypertension)   . Thyroid disease        Medications: Outpatient Medications Prior to Visit  Medication Sig  . albuterol (VENTOLIN HFA) 108 (90 Base) MCG/ACT inhaler INHALE 2 PUFFS INTO THE LUNGS EVERY 6 HOURS AS NEEDED  . amLODipine (NORVASC) 5 MG tablet Take 5 mg by mouth 2 (two) times daily.   Marland Kitchen aspirin 81 MG tablet Take 81 mg by mouth daily.  . citalopram (CELEXA) 10 MG tablet Take 10 mg by mouth daily.  . hydrochlorothiazide (HYDRODIURIL) 12.5 MG tablet Take 1 tablet (12.5 mg total) by mouth daily.  . hydrOXYzine (ATARAX/VISTARIL) 25 MG tablet Take 25 mg by mouth 4 (four) times daily as needed.  Marland Kitchen KRILL OIL PO Take 1 capsule by mouth daily. Reported on 06/02/2015  . levothyroxine (SYNTHROID) 100 MCG tablet Take 1 tablet (100 mcg total) by mouth daily.  Marland Kitchen lisinopril (PRINIVIL,ZESTRIL) 20 MG tablet Take 20 mg by mouth 2 (two) times daily.   Marland Kitchen  metoprolol tartrate (LOPRESSOR) 50 MG tablet Take 1 tablet (50 mg total) by mouth 2 (two) times daily. (Patient taking differently: Take 25 mg by mouth 2 (two) times daily. )  . Multiple Vitamin (MULTIVITAMIN) capsule Take 1 capsule by mouth daily.  . nortriptyline (PAMELOR) 25 MG capsule Take 4 capsule at bed time (Patient taking differently: Take 3  capsule at bed time)  . Omega 3-6-9 CAPS Take by mouth daily.   . polyethylene glycol (MIRALAX / GLYCOLAX) 17 g packet Take 17 g by mouth daily. Mix in 4-8 oz of fluid  . QUEtiapine (SEROQUEL) 100 MG tablet Take 200 mg by mouth at bedtime.    No facility-administered medications prior to visit.    Review of Systems  Constitutional: Negative.   HENT: Negative.   Respiratory: Negative.   Cardiovascular: Negative.   Gastrointestinal: Negative.   Musculoskeletal: Negative for arthralgias, myalgias and neck pain.  Skin: Positive for color change and rash. Negative for pallor and wound.  Neurological: Negative.   Psychiatric/Behavioral: Negative.     Last CBC Lab Results  Component Value Date   WBC 7.1 09/21/2019   HGB 11.9 09/21/2019   HCT 34.8 09/21/2019   MCV 95 09/21/2019   MCH 32.4 09/21/2019   RDW 12.1 09/21/2019   PLT 223 57/32/2025   Last metabolic panel Lab Results  Component Value Date   GLUCOSE 87 09/21/2019   NA 133 (L) 09/21/2019   K 4.3 09/21/2019   CL 93 (L) 09/21/2019   CO2 29 09/21/2019   BUN 12 09/21/2019   CREATININE 1.12 (H) 09/21/2019   GFRNONAA 49 (L) 09/21/2019   GFRAA 56 (L) 09/21/2019   CALCIUM 8.9 09/21/2019   PHOS 3.4 05/16/2016   PROT 7.4 09/21/2019   ALBUMIN 4.3 09/21/2019   LABGLOB 3.1 09/21/2019   AGRATIO 1.4 09/21/2019   BILITOT 0.4 09/21/2019   ALKPHOS 120 09/21/2019   AST 27 09/21/2019   ALT 17 09/21/2019   ANIONGAP 11 12/23/2018      Objective    BP 128/73 (BP Location: Right Arm, Patient Position: Sitting, Cuff Size: Normal)   Pulse 75   Temp (!) 96.9 F (36.1 C) (Temporal)   Wt 193 lb 6.4 oz (87.7 kg)   BMI 28.56 kg/m  BP Readings from Last 3 Encounters:  09/23/19 128/73  09/21/19 (!) 160/70  08/10/19 128/62      Physical Exam Constitutional:      Appearance: Normal appearance.  HENT:     Head: Normocephalic and atraumatic.     Right Ear: External ear normal.     Left Ear: External ear normal.      Mouth/Throat:     Mouth: Mucous membranes are moist.  Eyes:     General: No scleral icterus.       Right eye: No discharge.        Left eye: No discharge.     Extraocular Movements: Extraocular movements intact.     Pupils: Pupils are equal, round, and reactive to light.  Cardiovascular:     Rate and Rhythm: Normal rate and regular rhythm.     Pulses: Normal pulses.     Heart sounds: Normal heart sounds. No murmur heard.  No friction rub. No gallop.   Pulmonary:     Effort: Pulmonary effort is normal. No respiratory distress.     Breath sounds: Normal breath sounds. No stridor. No wheezing, rhonchi or rales.  Chest:     Chest wall: No tenderness.  Abdominal:  Palpations: Abdomen is soft.  Musculoskeletal:        General: No tenderness. Normal range of motion.     Cervical back: Normal range of motion and neck supple.     Right lower leg: No edema.     Left lower leg: No edema.  Lymphadenopathy:     Cervical: No cervical adenopathy.  Skin:    General: Skin is warm.     Capillary Refill: Capillary refill takes less than 2 seconds.     Findings: Erythema and rash present.     Comments: See media of lower extremity bilateral anterior rash that does still extend to right foot.  Left upper thigh also pictured.  Erythema has improved in all areas  Since last visit 2 days ago. No warmth, pain, drainage or edema.  Bilateral DF/PT pulses are 2 plus as well as popliteal.   No other rash noted on body elsewhere.  Neurological:     General: No focal deficit present.     Mental Status: She is alert and oriented to person, place, and time.     Gait: Gait normal.  Psychiatric:        Mood and Affect: Mood normal.        Behavior: Behavior normal.        Thought Content: Thought content normal.        Judgment: Judgment normal.      General: Appearance:     Overweight female in no acute distress  Eyes:    PERRL, conjunctiva/corneas clear, EOM's intact       Lungs:     Clear to  auscultation bilaterally, respirations unlabored  Heart:    Normal heart rate. Normal rhythm. No murmurs, rubs, or gallops.   MS:   All extremities are intact.   Neurologic:   Awake, alert, oriented x 3. No apparent focal neurological           defect.      Media Information   Document Information  Photos  Right lower leg  09/23/2019 08:13  Attached To:  Office Visit on 09/23/19 with Tomie Spizzirri, Kelby Aline, FNP  Source Information  Yechiel Erny, Kelby Aline, FNP  [facility]   Media Information   Document Information  Photos  Right lower leg   09/23/2019 08:13  Attached To:  Office Visit on 09/23/19 with Jerl Munyan, Kelby Aline, FNP  Source Information  Elizibeth Breau, Kelby Aline, FNP  [facility]  Media Information   Document Information  Photos  Left lower leg   09/23/2019 08:14  Attached To:  Office Visit on 09/23/19 with Hakop Humbarger, Kelby Aline, FNP  Source Information  Emmilia Sowder, Kelby Aline, FNP  [facility]   Media Information   Document Information  Photos  Left upper thigh   09/23/2019 08:14  Attached To:  Office Visit on 09/23/19 with Nijah Tejera, Kelby Aline, FNP  Source Information  Anquanette Bahner, Kelby Aline, FNP  [facility]   Media Information   Document Information  Photos  Left upper thigh   09/23/2019 08:14  Attached To:  Office Visit on 09/23/19 with Brynn Mulgrew, Kelby Aline, FNP  Source Information  Vitaliy Eisenhour, Kelby Aline, FNP  [facility]     No results found for any visits on 09/23/19.  Assessment & Plan     Drug reaction, subsequent encounter  Rash and nonspecific skin eruption  Improved rash with stopping statin 2 days ago. No other treatment given.  Advised to call if any signs of infection-  fever, chills, pain, warmth, nausea, vomiting discussed.   Will recheck around Wednesday of next week, she will call if any worsening symptoms and if weekend/ after hours seek care at medical facility.   Patient  verbalized understanding of all instructions given and denies any further questions at this time.    Return in about 6 days (around 09/29/2019), or if symptoms worsen or fail to improve, for Go to Emergency room/ urgent care if worse, at any time for any worsening symptoms.      IWellington Hampshire Archit Leger, FNP, have reviewed all documentation for this visit. The documentation on 09/23/19 for the exam, diagnosis, procedures, and orders are all accurate and complete.   Marcille Buffy, [place] [phone] (phone) [phone] (fax)  [place]

## 2019-10-01 ENCOUNTER — Ambulatory Visit (INDEPENDENT_AMBULATORY_CARE_PROVIDER_SITE_OTHER): Payer: Medicare Other | Admitting: Adult Health

## 2019-10-01 ENCOUNTER — Encounter: Payer: Self-pay | Admitting: Adult Health

## 2019-10-01 ENCOUNTER — Other Ambulatory Visit: Payer: Self-pay

## 2019-10-01 VITALS — BP 136/70 | HR 69 | Temp 97.1°F | Resp 16 | Wt 192.0 lb

## 2019-10-01 DIAGNOSIS — T50905D Adverse effect of unspecified drugs, medicaments and biological substances, subsequent encounter: Secondary | ICD-10-CM

## 2019-10-01 NOTE — Progress Notes (Signed)
Established patient visit   Patient: Wanda Ochoa. Dickmann   DOB: [birth year]   74 y.o. Female  MRN: [id number] Visit Date: 10/01/2019  Today's healthcare provider: Marcille Buffy, FNP   Chief Complaint  Patient presents with  . Follow-up   Subjective    HPI  Follow up for drug reaction  The patient was last seen for this 1 weeks ago. Changes made at last visit include stopped patient on statin.   She feels that condition is resolved completely.  She is not having side effects.   Patient  denies any fever, body aches,chills, rash, chest pain, shortness of breath, nausea, vomiting, or diarrhea.    -----------------------------------------------------------------------------------------   Patient Active Problem List   Diagnosis Date Noted  . Drug reaction, subsequent encounter 09/23/2019  . Rash and nonspecific skin eruption 09/23/2019  . Stable angina pectoris ([place]) 09/10/2019  . Stage 3a chronic kidney disease 09/10/2019  . Idiopathic gout involving toe of right foot 12/07/2018  . Primary osteoarthritis of right knee 03/07/2017  . Hematuria 09/11/2016  . Venous insufficiency of both lower extremities 09/04/2016  . Severe recurrent major depression without psychotic features ([place])   . Hypercholesteremia 05/02/2015  . Mitral valve prolapse 02/21/2015  . Allergic rhinitis 12/24/2014  . Osteopenia 12/24/2014  . Hypothyroidism 11/24/2014  . Generalized anxiety disorder 02/24/2012  . Nocturnal hypoxemia 01/20/2012  . Abdominal mass 01/20/2012  . Temporary cerebral vascular dysfunction 01/02/2009  . Benign hypertension 03/28/2008  . Asthma, exogenous 10/22/2007  . Adaptive colitis 03/12/2003   Past Medical History:  Diagnosis Date  . Anxiety   . Arthritis    right knee  . Asthma   . HTN (hypertension)   . Thyroid disease    Past Surgical History:  Procedure Laterality Date  . ABDOMINAL HYSTERECTOMY  1990  . BLADDER SUSPENSION  1990  .  BREAST CYST ASPIRATION Right 1985  . BREAST EXCISIONAL BIOPSY Right 1967   EXCISIONAL BX - NEG  . BREAST SURGERY Right 1967   Lumpectomy  . CESAREAN SECTION    . COLONOSCOPY WITH PROPOFOL N/A 12/18/2018   Procedure: COLONOSCOPY WITH PROPOFOL;  Surgeon: Lollie Sails, MD;  Location: [facility];  Service: Endoscopy;  Laterality: N/A;  . HEMORRHOIDECTOMY WITH HEMORRHOID BANDING  1988   Social History   Tobacco Use  . Smoking status: Never Smoker  . Smokeless tobacco: Never Used  Vaping Use  . Vaping Use: Never used  Substance Use Topics  . Alcohol use: No  . Drug use: No   Social History   Socioeconomic History  . Marital status: Married    Spouse name: Wanda Ochoa  . Number of children: 3  . Years of education: 16  . Highest education level: Bachelor's degree (e.g., BA, AB, BS)  Occupational History  . 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 Smoking status: Never Smoker  . Smokeless tobacco: Never Used  Vaping Use  . Vaping Use: Never used  Substance and Sexual Activity  . Alcohol use: No  . Drug use: No  . Sexual activity: Not on file  Other Topics Concern  . Not on file  Social History Narrative  . Not on file   Social Determinants of Health   Financial Resource Strain: Low Risk   . Difficulty of Paying Living Expenses: Not hard at all  Food Insecurity: No Food Insecurity  . Worried About Running  Out of Food in the Last Year: Never true  . Ran Out of Food in the Last Year: Never true  Transportation Needs: No Transportation Needs  . Lack of Transportation (Medical): No  . Lack of Transportation (Non-Medical): No  Physical Activity: Sufficiently Active  . Days of Exercise per Week: 4 days  . Minutes of Exercise per Session: 60 min  Stress: Stress Concern Present  . Feeling of Stress : To some extent  Social Connections: Moderately Integrated  . Frequency of  Communication with Friends and Family: Twice a week  . Frequency of Social Gatherings with Friends and Family: More than three times a week  . Attends Religious Services: Never  . Active Member of Clubs or Organizations: Yes  . Attends Archivist Meetings: More than 4 times per year  . Marital Status: Married  Human resources officer Violence: Not At Risk  . Fear of Current or Ex-Partner: No  . Emotionally Abused: No  . Physically Abused: No  . Sexually Abused: No   Family Status  Relation Name Status  . Father  Deceased at age 27's  . Sister  Alive  . Son  Alive  . Mother  Deceased at age 38  . Daughter  Alive  . Son  Alive  . Brother  Alive  . Sister  Alive  . Mat Uncle  (Not Specified)  . MGF  (Not Specified)  . MGM  (Not Specified)  . PGM  (Not Specified)  . Other niece Alive   Family History  Problem Relation Age of Onset  . Depression Father   . Suicidality Father   . Depression Sister   . Breast cancer Sister 63  . Autism Son   . Heart disease Mother   . Colon cancer Mother   . Hypertension Mother   . Anxiety disorder Mother   . Mental illness Daughter   . Allergies Brother   . Allergies Sister   . Asthma Maternal Uncle   . Heart disease Maternal Grandfather   . Heart disease Maternal Grandmother   . Breast cancer Paternal Grandmother   . Breast cancer Other    Allergies  Allergen Reactions  . Penicillins Anaphylaxis  . Lipitor [Atorvastatin] Rash    Severe rash on skin after taking approximately 2 weeks.        Medications: Outpatient Medications Prior to Visit  Medication Sig  . albuterol (VENTOLIN HFA) 108 (90 Base) MCG/ACT inhaler INHALE 2 PUFFS INTO THE LUNGS EVERY 6 HOURS AS NEEDED  . amLODipine (NORVASC) 5 MG tablet Take 5 mg by mouth 2 (two) times daily.   Marland Kitchen aspirin 81 MG tablet Take 81 mg by mouth daily.  . citalopram (CELEXA) 10 MG tablet Take 10 mg by mouth daily.  . hydrochlorothiazide (HYDRODIURIL) 12.5 MG tablet Take 1 tablet  (12.5 mg total) by mouth daily.  . hydrOXYzine (ATARAX/VISTARIL) 25 MG tablet Take 25 mg by mouth 4 (four) times daily as needed.  Marland Kitchen KRILL OIL PO Take 1 capsule by mouth daily. Reported on 06/02/2015  . levothyroxine (SYNTHROID) 100 MCG tablet Take 1 tablet (100 mcg total) by mouth daily.  Marland Kitchen lisinopril (PRINIVIL,ZESTRIL) 20 MG tablet Take 20 mg by mouth 2 (two) times daily.   . metoprolol tartrate (LOPRESSOR) 50 MG tablet Take 1 tablet (50 mg total) by mouth 2 (two) times daily. (Patient taking differently: Take 25 mg by mouth 2 (two) times daily. )  . Multiple Vitamin (MULTIVITAMIN) capsule Take 1 capsule by mouth daily.  Marland Kitchen  nortriptyline (PAMELOR) 25 MG capsule Take 4 capsule at bed time (Patient taking differently: Take 3 capsule at bed time)  . Omega 3-6-9 CAPS Take by mouth daily.   . polyethylene glycol (MIRALAX / GLYCOLAX) 17 g packet Take 17 g by mouth daily. Mix in 4-8 oz of fluid  . QUEtiapine (SEROQUEL) 100 MG tablet Take 200 mg by mouth at bedtime.    No facility-administered medications prior to visit.    Review of Systems  Last CBC Lab Results  Component Value Date   WBC 7.1 09/21/2019   HGB 11.9 09/21/2019   HCT 34.8 09/21/2019   MCV 95 09/21/2019   MCH 32.4 09/21/2019   RDW 12.1 09/21/2019   PLT 223 82/95/6213   Last metabolic panel Lab Results  Component Value Date   GLUCOSE 87 09/21/2019   NA 133 (L) 09/21/2019   K 4.3 09/21/2019   CL 93 (L) 09/21/2019   CO2 29 09/21/2019   BUN 12 09/21/2019   CREATININE 1.12 (H) 09/21/2019   GFRNONAA 49 (L) 09/21/2019   GFRAA 56 (L) 09/21/2019   CALCIUM 8.9 09/21/2019   PHOS 3.4 05/16/2016   PROT 7.4 09/21/2019   ALBUMIN 4.3 09/21/2019   LABGLOB 3.1 09/21/2019   AGRATIO 1.4 09/21/2019   BILITOT 0.4 09/21/2019   ALKPHOS 120 09/21/2019   AST 27 09/21/2019   ALT 17 09/21/2019   ANIONGAP 11 12/23/2018   Last lipids Lab Results  Component Value Date   CHOL 214 (H) 08/10/2019   HDL 57 08/10/2019   LDLCALC 132 (H)  08/10/2019   TRIG 141 08/10/2019   CHOLHDL 3.8 08/10/2019   Last hemoglobin A1c Lab Results  Component Value Date   HGBA1C 5.4 08/10/2019   Last thyroid functions Lab Results  Component Value Date   TSH 1.600 08/10/2019   Last vitamin D No results found for: 25OHVITD2, 25OHVITD3, VD25OH Last vitamin B12 and Folate No results found for: VITAMINB12, FOLATE    Objective    BP 136/70 Comment: recheck  Pulse 69   Temp (!) 97.1 F (36.2 C) (Oral)   Resp 16   Wt 192 lb (87.1 kg)   SpO2 97%   BMI 28.35 kg/m  BP Readings from Last 3 Encounters:  10/01/19 136/70  09/23/19 128/73  09/21/19 (!) 160/70      Physical Exam Vitals reviewed.  Constitutional:      Appearance: Normal appearance. She is not ill-appearing.  HENT:     Head: Normocephalic and atraumatic.     Nose: Nose normal.     Mouth/Throat:     Mouth: Mucous membranes are moist.  Eyes:     Pupils: Pupils are equal, round, and reactive to light.  Cardiovascular:     Rate and Rhythm: Normal rate and regular rhythm.     Pulses: Normal pulses.     Heart sounds: Normal heart sounds.  Pulmonary:     Effort: Pulmonary effort is normal.     Breath sounds: Normal breath sounds.  Abdominal:     Palpations: Abdomen is soft.  Musculoskeletal:        General: Normal range of motion.     Cervical back: Neck supple.  Skin:    General: Skin is warm and dry.     Capillary Refill: Capillary refill takes less than 2 seconds.     Coloration: Skin is not jaundiced.     Findings: No bruising, erythema, lesion or rash.  Neurological:     General: No focal deficit present.  Mental Status: She is alert and oriented to person, place, and time.  Psychiatric:        Mood and Affect: Mood normal.        Behavior: Behavior normal.        Thought Content: Thought content normal.        Judgment: Judgment normal.    Media Information rash has resolved. No rash on thigh as previous visit. All resolved.    Document  Information  Photos  Left leg   10/01/2019 11:11  Attached To:  Office Visit on 10/01/19 with Denisha Hoel, Kelby Aline, FNP  Source Information  Bruno Leach, Kelby Aline, FNP  [facility]   Media Information   Document Information  Photos  Right leg   10/01/2019 11:11  Attached To:  Office Visit on 10/01/19 with Crescent Gotham, Kelby Aline, FNP  Source Information  Iyanla Eilers, Kelby Aline, FNP  [facility]      No results found for any visits on 10/01/19.  Assessment & Plan     Drug reaction, subsequent encounter  She will remain off statin given likely allergic fixed drug reaction.. Recommend possibly Zetia, she is going to discuss with Dr. Drema Dallas since he started her on the statin.  She will call his office and follow back up with [place] if any questions or concerns.   Cholesterol diet and exercise/ lifestyle medications discussed.   Keep regular follow up's/ for primary care.   Return if symptoms worsen or fail to improve, for at any time for any worsening symptoms, Go to Emergency room/ urgent care if worse.      IWellington Hampshire Ria Redcay, FNP, have reviewed all documentation for this visit. The documentation on 10/01/19 for the exam, diagnosis, procedures, and orders are all accurate and complete.   Marcille Buffy, [place] [phone] (phone) [phone] (fax)  [place]

## 2019-10-01 NOTE — Patient Instructions (Signed)
Cholesterol Content in Foods Cholesterol is a waxy, fat-like substance that helps to carry fat in the blood. The body needs cholesterol in small amounts, but too much cholesterol can cause damage to the arteries and heart. Most people should eat less than 200 milligrams (mg) of cholesterol a day. Foods with cholesterol  Cholesterol is found in animal-based foods, such as meat, seafood, and dairy. Generally, low-fat dairy and lean meats have less cholesterol than full-fat dairy and fatty meats. The milligrams of cholesterol per serving (mg per serving) of common cholesterol-containing foods are listed below. Meat and other proteins  Egg -- one large whole egg has 186 mg.  Veal shank -- 4 oz has 141 mg.  Lean ground turkey (93% lean) -- 4 oz has 118 mg.  Fat-trimmed lamb loin -- 4 oz has 106 mg.  Lean ground beef (90% lean) -- 4 oz has 100 mg.  Lobster -- 3.5 oz has 90 mg.  Pork loin chops -- 4 oz has 86 mg.  Canned salmon -- 3.5 oz has 83 mg.  Fat-trimmed beef top loin -- 4 oz has 78 mg.  Frankfurter -- 1 frank (3.5 oz) has 77 mg.  Crab -- 3.5 oz has 71 mg.  Roasted chicken without skin, white meat -- 4 oz has 66 mg.  Light bologna -- 2 oz has 45 mg.  Deli-cut turkey -- 2 oz has 31 mg.  Canned tuna -- 3.5 oz has 31 mg.  Bacon -- 1 oz has 29 mg.  Oysters and mussels (raw) -- 3.5 oz has 25 mg.  Mackerel -- 1 oz has 22 mg.  Trout -- 1 oz has 20 mg.  Pork sausage -- 1 link (1 oz) has 17 mg.  Salmon -- 1 oz has 16 mg.  Tilapia -- 1 oz has 14 mg. Dairy  Soft-serve ice cream --  cup (4 oz) has 103 mg.  Whole-milk yogurt -- 1 cup (8 oz) has 29 mg.  Cheddar cheese -- 1 oz has 28 mg.  American cheese -- 1 oz has 28 mg.  Whole milk -- 1 cup (8 oz) has 23 mg.  2% milk -- 1 cup (8 oz) has 18 mg.  Cream cheese -- 1 tablespoon (Tbsp) has 15 mg.  Cottage cheese --  cup (4 oz) has 14 mg.  Low-fat (1%) milk -- 1 cup (8 oz) has 10 mg.  Sour cream -- 1 Tbsp has 8.5  mg.  Low-fat yogurt -- 1 cup (8 oz) has 8 mg.  Nonfat Greek yogurt -- 1 cup (8 oz) has 7 mg.  Half-and-half cream -- 1 Tbsp has 5 mg. Fats and oils  Cod liver oil -- 1 tablespoon (Tbsp) has 82 mg.  Butter -- 1 Tbsp has 15 mg.  Lard -- 1 Tbsp has 14 mg.  Bacon grease -- 1 Tbsp has 14 mg.  Mayonnaise -- 1 Tbsp has 5-10 mg.  Margarine -- 1 Tbsp has 3-10 mg. Exact amounts of cholesterol in these foods may vary depending on specific ingredients and brands. Foods without cholesterol Most plant-based foods do not have cholesterol unless you combine them with a food that has cholesterol. Foods without cholesterol include:  Grains and cereals.  Vegetables.  Fruits.  Vegetable oils, such as olive, canola, and sunflower oil.  Legumes, such as peas, beans, and lentils.  Nuts and seeds.  Egg whites. Summary  The body needs cholesterol in small amounts, but too much cholesterol can cause damage to the arteries and heart.    Most people should eat less than 200 milligrams (mg) of cholesterol a day. This information is not intended to replace advice given to you by your health care provider. Make sure you discuss any questions you have with your health care provider. Document Revised: 02/21/2017 Document Reviewed: 11/05/2016 Elsevier Patient Education  [place]. High Cholesterol  High cholesterol is a condition in which the blood has high levels of a white, waxy, fat-like substance (cholesterol). The human body needs small amounts of cholesterol. The liver makes all the cholesterol that the body needs. Extra (excess) cholesterol comes from the food that we eat. Cholesterol is carried from the liver by the blood through the blood vessels. If you have high cholesterol, deposits (plaques) may build up on the walls of your blood vessels (arteries). Plaques make the arteries narrower and stiffer. Cholesterol plaques increase your risk for heart attack and stroke. Work with your  health care provider to keep your cholesterol levels in a healthy range. What increases the risk? This condition is more likely to develop in people who:  Eat foods that are high in animal fat (saturated fat) or cholesterol.  Are overweight.  Are not getting enough exercise.  Have a family history of high cholesterol. What are the signs or symptoms? There are no symptoms of this condition. How is this diagnosed? This condition may be diagnosed from the results of a blood test.  If you are older than age 46, your health care provider may check your cholesterol every 4-6 years.  You may be checked more often if you already have high cholesterol or other risk factors for heart disease. The blood test for cholesterol measures:  "Bad" cholesterol (LDL cholesterol). This is the main type of cholesterol that causes heart disease. The desired level for LDL is less than 100.  "Good" cholesterol (HDL cholesterol). This type helps to protect against heart disease by cleaning the arteries and carrying the LDL away. The desired level for HDL is 60 or higher.  Triglycerides. These are fats that the body can store or burn for energy. The desired number for triglycerides is lower than 150.  Total cholesterol. This is a measure of the total amount of cholesterol in your blood, including LDL cholesterol, HDL cholesterol, and triglycerides. A healthy number is less than 200. How is this treated? This condition is treated with diet changes, lifestyle changes, and medicines. Diet changes  This may include eating more whole grains, fruits, vegetables, nuts, and fish.  This may also include cutting back on red meat and foods that have a lot of added sugar. Lifestyle changes  Changes may include getting at least 40 minutes of aerobic exercise 3 times a week. Aerobic exercises include walking, biking, and swimming. Aerobic exercise along with a healthy diet can help you maintain a healthy  weight.  Changes may also include quitting smoking. Medicines  Medicines are usually given if diet and lifestyle changes have failed to reduce your cholesterol to healthy levels.  Your health care provider may prescribe a statin medicine. Statin medicines have been shown to reduce cholesterol, which can reduce the risk of heart disease. Follow these instructions at home: Eating and drinking If told by your health care provider:  Eat chicken (without skin), fish, veal, shellfish, ground [place] breast, and round or loin cuts of red meat.  Do not eat fried foods or fatty meats, such as hot dogs and salami.  Eat plenty of fruits, such as apples.  Eat plenty of vegetables,  such as broccoli, potatoes, and carrots.  Eat beans, peas, and lentils.  Eat grains such as barley, rice, couscous, and bulgur wheat.  Eat pasta without cream sauces.  Use skim or nonfat milk, and eat low-fat or nonfat yogurt and cheeses.  Do not eat or drink whole milk, cream, ice cream, egg yolks, or hard cheeses.  Do not eat stick margarine or tub margarines that contain trans fats (also called partially hydrogenated oils).  Do not eat saturated tropical oils, such as coconut oil and palm oil.  Do not eat cakes, cookies, crackers, or other baked goods that contain trans fats.  General instructions  Exercise as directed by your health care provider. Increase your activity level with activities such as gardening, walking, and taking the stairs.  Take over-the-counter and prescription medicines only as told by your health care provider.  Do not use any products that contain nicotine or tobacco, such as cigarettes and e-cigarettes. If you need help quitting, ask your health care provider.  Keep all follow-up visits as told by your health care provider. This is important. Contact a health care provider if:  You are struggling to maintain a healthy diet or weight.  You need help to start on an exercise  program.  You need help to stop smoking. Get help right away if:  You have chest pain.  You have trouble breathing. This information is not intended to replace advice given to you by your health care provider. Make sure you discuss any questions you have with your health care provider. Document Revised: 03/14/2017 Document Reviewed: 09/09/2015 Elsevier Patient Education  [place].

## 2019-10-05 DIAGNOSIS — F331 Major depressive disorder, recurrent, moderate: Secondary | ICD-10-CM | POA: Diagnosis not present

## 2019-10-05 DIAGNOSIS — F411 Generalized anxiety disorder: Secondary | ICD-10-CM | POA: Diagnosis not present

## 2019-10-15 DIAGNOSIS — N1831 Chronic kidney disease, stage 3a: Secondary | ICD-10-CM | POA: Diagnosis not present

## 2019-10-15 DIAGNOSIS — E782 Mixed hyperlipidemia: Secondary | ICD-10-CM | POA: Diagnosis not present

## 2019-10-15 DIAGNOSIS — I10 Essential (primary) hypertension: Secondary | ICD-10-CM | POA: Diagnosis not present

## 2019-11-11 ENCOUNTER — Other Ambulatory Visit: Payer: Self-pay | Admitting: Family Medicine

## 2019-11-11 DIAGNOSIS — Z1231 Encounter for screening mammogram for malignant neoplasm of breast: Secondary | ICD-10-CM

## 2019-11-30 DIAGNOSIS — F331 Major depressive disorder, recurrent, moderate: Secondary | ICD-10-CM | POA: Diagnosis not present

## 2019-11-30 DIAGNOSIS — F411 Generalized anxiety disorder: Secondary | ICD-10-CM | POA: Diagnosis not present

## 2019-12-03 ENCOUNTER — Ambulatory Visit
Admission: RE | Admit: 2019-12-03 | Discharge: 2019-12-03 | Disposition: A | Discharge status: Home / Self Care | Payer: Medicare Other | Source: Ambulatory Visit | Attending: Family Medicine | Admitting: Family Medicine

## 2019-12-03 DIAGNOSIS — Z1231 Encounter for screening mammogram for malignant neoplasm of breast: Secondary | ICD-10-CM | POA: Diagnosis not present

## 2019-12-06 ENCOUNTER — Telehealth: Payer: Self-pay

## 2019-12-06 NOTE — Telephone Encounter (Signed)
Pt advised.   Thanks,   -Wanda Ochoa  

## 2019-12-06 NOTE — Telephone Encounter (Signed)
-----   Message from Virginia Crews, MD sent at 12/06/2019  3:21 PM EDT ----- Normal mammogram. Repeat in 1 yr

## 2019-12-14 ENCOUNTER — Other Ambulatory Visit: Payer: Self-pay | Admitting: Family Medicine

## 2019-12-14 MED ORDER — HYDROCHLOROTHIAZIDE 12.5 MG PO TABS: 12.5000 mg | ORAL_TABLET | Freq: Every day | ORAL | 1 refills | Status: DC

## 2019-12-14 NOTE — Telephone Encounter (Signed)
[facility] faxed refill request for the following medications:  hydrochlorothiazide (HYDRODIURIL) 12.5 MG tablet  Last Rx: 12/07/2018 LOV: 10/01/2019 Please advise. Thanks TNP

## 2019-12-16 DIAGNOSIS — F411 Generalized anxiety disorder: Secondary | ICD-10-CM | POA: Diagnosis not present

## 2019-12-16 DIAGNOSIS — F331 Major depressive disorder, recurrent, moderate: Secondary | ICD-10-CM | POA: Diagnosis not present

## 2019-12-16 DIAGNOSIS — F5105 Insomnia due to other mental disorder: Secondary | ICD-10-CM | POA: Diagnosis not present

## 2020-02-14 ENCOUNTER — Ambulatory Visit: Payer: Self-pay | Admitting: Family Medicine

## 2020-02-15 DIAGNOSIS — F411 Generalized anxiety disorder: Secondary | ICD-10-CM | POA: Diagnosis not present

## 2020-02-15 DIAGNOSIS — F5105 Insomnia due to other mental disorder: Secondary | ICD-10-CM | POA: Diagnosis not present

## 2020-02-15 DIAGNOSIS — F331 Major depressive disorder, recurrent, moderate: Secondary | ICD-10-CM | POA: Diagnosis not present

## 2020-02-25 ENCOUNTER — Ambulatory Visit: Payer: Medicare Other | Admitting: Family Medicine

## 2020-04-03 DIAGNOSIS — E782 Mixed hyperlipidemia: Secondary | ICD-10-CM | POA: Diagnosis not present

## 2020-04-03 DIAGNOSIS — Z23 Encounter for immunization: Secondary | ICD-10-CM | POA: Diagnosis not present

## 2020-04-03 DIAGNOSIS — I10 Essential (primary) hypertension: Secondary | ICD-10-CM | POA: Diagnosis not present

## 2020-04-03 DIAGNOSIS — I341 Nonrheumatic mitral (valve) prolapse: Secondary | ICD-10-CM | POA: Diagnosis not present

## 2020-04-03 DIAGNOSIS — I38 Endocarditis, valve unspecified: Secondary | ICD-10-CM | POA: Diagnosis not present

## 2020-04-18 DIAGNOSIS — F331 Major depressive disorder, recurrent, moderate: Secondary | ICD-10-CM | POA: Diagnosis not present

## 2020-04-18 DIAGNOSIS — H2513 Age-related nuclear cataract, bilateral: Secondary | ICD-10-CM | POA: Diagnosis not present

## 2020-04-18 DIAGNOSIS — F411 Generalized anxiety disorder: Secondary | ICD-10-CM | POA: Diagnosis not present

## 2020-05-03 DIAGNOSIS — R519 Headache, unspecified: Secondary | ICD-10-CM | POA: Diagnosis not present

## 2020-05-03 DIAGNOSIS — S0003XA Contusion of scalp, initial encounter: Secondary | ICD-10-CM | POA: Diagnosis not present

## 2020-05-03 DIAGNOSIS — I10 Essential (primary) hypertension: Secondary | ICD-10-CM | POA: Diagnosis not present

## 2020-05-03 DIAGNOSIS — W010XXA Fall on same level from slipping, tripping and stumbling without subsequent striking against object, initial encounter: Secondary | ICD-10-CM | POA: Diagnosis not present

## 2020-05-03 DIAGNOSIS — Z79899 Other long term (current) drug therapy: Secondary | ICD-10-CM | POA: Diagnosis not present

## 2020-05-03 DIAGNOSIS — Z20822 Contact with and (suspected) exposure to covid-19: Secondary | ICD-10-CM | POA: Diagnosis not present

## 2020-05-03 DIAGNOSIS — E785 Hyperlipidemia, unspecified: Secondary | ICD-10-CM | POA: Diagnosis not present

## 2020-05-03 DIAGNOSIS — W19XXXA Unspecified fall, initial encounter: Secondary | ICD-10-CM | POA: Diagnosis not present

## 2020-05-03 DIAGNOSIS — Y92238 Other place in hospital as the place of occurrence of the external cause: Secondary | ICD-10-CM | POA: Diagnosis not present

## 2020-05-09 DIAGNOSIS — F331 Major depressive disorder, recurrent, moderate: Secondary | ICD-10-CM | POA: Diagnosis not present

## 2020-05-09 DIAGNOSIS — F411 Generalized anxiety disorder: Secondary | ICD-10-CM | POA: Diagnosis not present

## 2020-05-09 DIAGNOSIS — F5105 Insomnia due to other mental disorder: Secondary | ICD-10-CM | POA: Diagnosis not present

## 2020-06-09 ENCOUNTER — Other Ambulatory Visit: Payer: Self-pay | Admitting: Family Medicine

## 2020-06-09 NOTE — Telephone Encounter (Signed)
Requested medications are due for refill today yes  Requested medications are on the active medication list yes  Last refill 03/13/20  Last visit 07/2019  Future visit scheduled 07/220  Notes to clinic OV note states return in 6 months (01/2020), failed protocol of visit within 6 months.

## 2020-06-30 DIAGNOSIS — I440 Atrioventricular block, first degree: Secondary | ICD-10-CM | POA: Diagnosis not present

## 2020-06-30 DIAGNOSIS — I10 Essential (primary) hypertension: Secondary | ICD-10-CM | POA: Diagnosis not present

## 2020-06-30 DIAGNOSIS — F4321 Adjustment disorder with depressed mood: Secondary | ICD-10-CM | POA: Diagnosis not present

## 2020-07-03 DIAGNOSIS — F5105 Insomnia due to other mental disorder: Secondary | ICD-10-CM | POA: Diagnosis not present

## 2020-07-03 DIAGNOSIS — F331 Major depressive disorder, recurrent, moderate: Secondary | ICD-10-CM | POA: Diagnosis not present

## 2020-07-03 DIAGNOSIS — F411 Generalized anxiety disorder: Secondary | ICD-10-CM | POA: Diagnosis not present

## 2020-07-13 ENCOUNTER — Other Ambulatory Visit: Payer: Self-pay | Admitting: Psychiatry

## 2020-07-14 ENCOUNTER — Encounter: Payer: Self-pay | Admitting: Anesthesiology

## 2020-07-14 ENCOUNTER — Encounter
Admission: RE | Admit: 2020-07-14 | Discharge: 2020-07-14 | Disposition: A | Discharge status: Home / Self Care | Payer: Medicare Other | Source: Ambulatory Visit | Attending: Psychiatry | Admitting: Psychiatry

## 2020-07-14 ENCOUNTER — Other Ambulatory Visit: Payer: Self-pay

## 2020-07-14 DIAGNOSIS — F419 Anxiety disorder, unspecified: Secondary | ICD-10-CM | POA: Diagnosis not present

## 2020-07-14 DIAGNOSIS — F418 Other specified anxiety disorders: Secondary | ICD-10-CM | POA: Diagnosis not present

## 2020-07-14 DIAGNOSIS — F332 Major depressive disorder, recurrent severe without psychotic features: Secondary | ICD-10-CM | POA: Diagnosis not present

## 2020-07-14 MED ORDER — LABETALOL HCL 5 MG/ML IV SOLN
INTRAVENOUS | Status: DC | PRN
  Administered 2020-07-14: 5 mg via INTRAVENOUS

## 2020-07-14 MED ORDER — SODIUM CHLORIDE 0.9 % IV SOLN: INTRAVENOUS | Status: DC | PRN

## 2020-07-14 MED ORDER — KETOROLAC TROMETHAMINE 30 MG/ML IJ SOLN
30.0000 mg | Freq: Once | INTRAMUSCULAR | Status: AC
  Administered 2020-07-14: 30 mg via INTRAVENOUS

## 2020-07-14 MED ORDER — METHOHEXITAL SODIUM 100 MG/10ML IV SOSY
PREFILLED_SYRINGE | INTRAVENOUS | Status: DC | PRN
  Administered 2020-07-14: 80 mg via INTRAVENOUS

## 2020-07-14 MED ORDER — SODIUM CHLORIDE 0.9 % IV SOLN
500.0000 mL | Freq: Once | INTRAVENOUS | Status: AC
  Administered 2020-07-14: 500 mL via INTRAVENOUS

## 2020-07-14 MED ORDER — SUCCINYLCHOLINE CHLORIDE 20 MG/ML IJ SOLN
INTRAMUSCULAR | Status: DC | PRN
  Administered 2020-07-14: 80 mg via INTRAVENOUS

## 2020-07-14 NOTE — H&P (Signed)
Wanda Ochoa is an 75 y.o. female.   Chief Complaint: Patient presented with some worsening anxiety and dysphoric mood HPI: History of chronic depression with previous good response especially in catatonic situations to ECT  Past Medical History:  Diagnosis Date  . Anxiety   . Arthritis    right knee  . Asthma   . HTN (hypertension)   . Thyroid disease     Past Surgical History:  Procedure Laterality Date  . ABDOMINAL HYSTERECTOMY  1990  . BLADDER SUSPENSION  1990  . BREAST CYST ASPIRATION Right 1985  . BREAST EXCISIONAL BIOPSY Right 1967   EXCISIONAL BX - NEG  . BREAST SURGERY Right 1967   Lumpectomy  . CESAREAN SECTION    . COLONOSCOPY WITH PROPOFOL N/A 12/18/2018   Procedure: COLONOSCOPY WITH PROPOFOL;  Surgeon: Lollie Sails, MD;  Location: [facility] ENDOSCOPY;  Service: Endoscopy;  Laterality: N/A;  . HEMORRHOIDECTOMY WITH HEMORRHOID BANDING  1988    Family History  Problem Relation Age of Onset  . Depression Father   . Suicidality Father   . Depression Sister   . Breast cancer Sister 64  . Autism Son   . Heart disease Mother   . Colon cancer Mother   . Hypertension Mother   . Anxiety disorder Mother   . Mental illness Daughter   . Allergies Brother   . Allergies Sister   . Asthma Maternal Uncle   . Heart disease Maternal Grandfather   . Heart disease Maternal Grandmother   . Breast cancer Paternal Grandmother   . Breast cancer Other    Social History:  reports that she has never smoked. She has never used smokeless tobacco. She reports that she does not drink alcohol and does not use drugs.  Allergies:  Allergies  Allergen Reactions  . Penicillins Anaphylaxis  . Lipitor [Atorvastatin] Rash    Severe rash on skin after taking approximately 2 weeks.     (Not in a hospital admission)   No results found for this or any previous visit (from the past 48 hour(s)). No results found.  Review of Systems  Constitutional: Negative.   HENT: Negative.    Eyes: Negative.   Respiratory: Negative.   Cardiovascular: Negative.   Gastrointestinal: Negative.   Musculoskeletal: Negative.   Skin: Negative.   Neurological: Negative.   Psychiatric/Behavioral: Positive for dysphoric mood. Negative for suicidal ideas. The patient is nervous/anxious.     Blood pressure (!) 151/69, pulse 62, temperature 98.4 F (36.9 C), temperature source Oral, resp. rate 18, height 5\' 9"  (1.753 m), weight 79.8 kg, SpO2 98 %. Physical Exam Vitals and nursing note reviewed.  Constitutional:      Appearance: She is well-developed.  HENT:     Head: Normocephalic and atraumatic.  Eyes:     Conjunctiva/sclera: Conjunctivae normal.     Pupils: Pupils are equal, round, and reactive to light.  Cardiovascular:     Heart sounds: Normal heart sounds.  Pulmonary:     Effort: Pulmonary effort is normal.  Abdominal:     Palpations: Abdomen is soft.  Musculoskeletal:        General: Normal range of motion.     Cervical back: Normal range of motion.  Skin:    General: Skin is warm and dry.  Neurological:     General: No focal deficit present.     Mental Status: She is alert.     Cranial Nerves: Cranial nerves are intact.     Motor: Motor function is  intact.     Coordination: Coordination is intact.  Psychiatric:        Attention and Perception: Attention normal.        Mood and Affect: Mood is anxious.        Speech: Speech normal.        Behavior: Behavior is cooperative.        Thought Content: Thought content normal.        Cognition and Memory: Cognition normal.      Assessment/Plan 1 ECT treatment today which is often been very effective in the past.  Patient will be following up with her regular outpatient psychiatrist and we can reassess if needed in a week.  [name], MD 07/14/2020, 11:59 AM

## 2020-07-14 NOTE — Transfer of Care (Signed)
Immediate Anesthesia Transfer of Care Note  Patient: Wanda Ochoa  Procedure(s) Performed: ECT TX  Patient Location: PACU  Anesthesia Type:General  Level of Consciousness: awake  Airway & Oxygen Therapy: Patient Spontanous Breathing and Patient connected to face mask oxygen  Post-op Assessment: Report given to RN and Post -op Vital signs reviewed and stable  Post vital signs: Reviewed and stable  Last Vitals:  Vitals Value Taken Time  BP 184/83 07/14/20 1422  Temp 36.4 C 07/14/20 1422  Pulse 71 07/14/20 1426  Resp 17 07/14/20 1426  SpO2 100 % 07/14/20 1426  Vitals shown include unvalidated device data.  Last Pain:  Vitals:   07/14/20 1422  TempSrc:   PainSc: 0-No pain         Complications: No complications documented.

## 2020-07-14 NOTE — Anesthesia Procedure Notes (Signed)
Procedure Name: General with mask airway Date/Time: 07/14/2020 2:02 PM Performed by: Allean Found, CRNA Pre-anesthesia Checklist: Patient identified, Emergency Drugs available, Suction available, Patient being monitored and Timeout performed Patient Re-evaluated:Patient Re-evaluated prior to induction Oxygen Delivery Method: Circle system utilized Preoxygenation: Pre-oxygenation with 100% oxygen Induction Type: IV induction Ventilation: Mask ventilation without difficulty and Mask ventilation throughout procedure

## 2020-07-14 NOTE — Anesthesia Preprocedure Evaluation (Signed)
Anesthesia Evaluation  Patient identified by MRN, date of birth, ID band Patient awake    Reviewed: Allergy & Precautions, NPO status , [name] Chart, lab work & pertinent test results  History of Anesthesia Complications Negative for: history of anesthetic complications  Airway Mallampati: II  TM Distance: >3 FB Neck ROM: Full    Dental no notable dental hx.    Pulmonary asthma ,    breath sounds clear to auscultation- rhonchi (-) wheezing      Cardiovascular Exercise Tolerance: Good hypertension, Pt. on medications (-) CAD, (-) Past MI, (-) Cardiac Stents and (-) CABG  Rhythm:Regular Rate:Normal - Systolic murmurs and - Diastolic murmurs    Neuro/Psych neg Seizures PSYCHIATRIC DISORDERS Anxiety Depression negative neurological ROS     GI/Hepatic negative GI ROS, Neg liver ROS,   Endo/Other  neg diabetesHypothyroidism   Renal/GU negative Renal ROS     Musculoskeletal  (+) Arthritis ,   Abdominal (+) - obese,   Peds  Hematology negative hematology ROS (+)   Anesthesia Other Findings Past Medical History: No date: Anxiety No date: Arthritis     Comment:  right knee No date: Asthma No date: HTN (hypertension) No date: Thyroid disease   Reproductive/Obstetrics                             Anesthesia Physical  Anesthesia Plan  ASA: III  Anesthesia Plan: General   Post-op Pain Management:    Induction: Intravenous  PONV Risk Score and Plan: 2  Airway Management Planned: Mask  Additional Equipment:   Intra-op Plan:   Post-operative Plan:   Informed Consent: I have reviewed the patients History and Physical, chart, labs and discussed the procedure including the risks, benefits and alternatives for the proposed anesthesia with the patient or authorized representative who has indicated his/her understanding and acceptance.     Dental advisory given  Plan Discussed with:  CRNA and Anesthesiologist  Anesthesia Plan Comments:         Anesthesia Quick Evaluation

## 2020-07-16 NOTE — Procedures (Signed)
ECT SERVICES Physician's Interval Evaluation & Treatment Note  Patient Identification: Wanda Ochoa MRN:  [id number] Date of Evaluation:  07/14/2020 TX #: 4  MADRS:   MMSE:   P.E. Findings:  unremarkable  Psychiatric Interval Note:  Anxious. Not psychhotic or suicidal  Subjective:  Patient is a 75 y.o. female seen for evaluation for Electroconvulsive Therapy. anxious  Treatment Summary:   [x]   Right Unilateral             []  Bilateral   % Energy : 0.24ms, 75%   Impedance: 1860 ohms  Seizure Energy Index: 5753mv 2   Postictal Suppression Index: 82%  Seizure Concordance Index: 58%   Medications  Pre Shock: torodol 30mg , brevital 80mg , succinylcholine 80mg   Post Shock:    Seizure Duration: 22s emg 22s eeg   Comments: Follow up 1 week  Lungs:  [x]   Clear to auscultation               []  Other:   Heart:    [x]   Regular rhythm             []  irregular rhythm    [x]   Previous H&P reviewed, patient examined and there are NO CHANGES                 []   Previous H&P reviewed, patient examined and there are changes noted.   [name], MD 4/22/20229:05 PM

## 2020-07-18 DIAGNOSIS — N1831 Chronic kidney disease, stage 3a: Secondary | ICD-10-CM | POA: Diagnosis not present

## 2020-07-18 DIAGNOSIS — E782 Mixed hyperlipidemia: Secondary | ICD-10-CM | POA: Diagnosis not present

## 2020-07-18 DIAGNOSIS — I10 Essential (primary) hypertension: Secondary | ICD-10-CM | POA: Diagnosis not present

## 2020-07-20 NOTE — Anesthesia Postprocedure Evaluation (Signed)
Anesthesia Post Note  Patient: TRUDE CANSLER  Procedure(s) Performed: ECT TX  Patient location during evaluation: PACU Anesthesia Type: General Level of consciousness: awake and alert Pain management: pain level controlled Vital Signs Assessment: post-procedure vital signs reviewed and stable Respiratory status: spontaneous breathing, nonlabored ventilation, respiratory function stable and patient connected to nasal cannula oxygen Cardiovascular status: blood pressure returned to baseline and stable Postop Assessment: no apparent nausea or vomiting Anesthetic complications: no   No complications documented.   Last Vitals:  Vitals:   07/14/20 1450 07/14/20 1455  BP: (!) 175/77 (!) 160/70  Pulse: 66 65  Resp: 19 18  Temp: 37.1 C 36.9 C  SpO2: 97%     Last Pain:  Vitals:   07/14/20 1455  TempSrc: Oral  PainSc: 0-No pain                 Martha Clan

## 2020-07-23 DIAGNOSIS — F5105 Insomnia due to other mental disorder: Secondary | ICD-10-CM | POA: Diagnosis not present

## 2020-07-23 DIAGNOSIS — F411 Generalized anxiety disorder: Secondary | ICD-10-CM | POA: Diagnosis not present

## 2020-07-23 DIAGNOSIS — F331 Major depressive disorder, recurrent, moderate: Secondary | ICD-10-CM | POA: Diagnosis not present

## 2020-07-25 ENCOUNTER — Other Ambulatory Visit: Payer: Self-pay | Admitting: Psychiatry

## 2020-07-26 ENCOUNTER — Encounter: Payer: Self-pay | Admitting: Anesthesiology

## 2020-07-26 ENCOUNTER — Other Ambulatory Visit: Payer: Self-pay

## 2020-07-26 ENCOUNTER — Encounter
Admission: RE | Admit: 2020-07-26 | Discharge: 2020-07-26 | Disposition: A | Discharge status: Home / Self Care | Payer: Medicare Other | Source: Ambulatory Visit | Attending: Psychiatry | Admitting: Psychiatry

## 2020-07-26 DIAGNOSIS — F32A Depression, unspecified: Secondary | ICD-10-CM | POA: Diagnosis not present

## 2020-07-26 DIAGNOSIS — F332 Major depressive disorder, recurrent severe without psychotic features: Secondary | ICD-10-CM

## 2020-07-26 DIAGNOSIS — F419 Anxiety disorder, unspecified: Secondary | ICD-10-CM | POA: Diagnosis not present

## 2020-07-26 DIAGNOSIS — F418 Other specified anxiety disorders: Secondary | ICD-10-CM | POA: Diagnosis not present

## 2020-07-26 DIAGNOSIS — M1990 Unspecified osteoarthritis, unspecified site: Secondary | ICD-10-CM | POA: Diagnosis not present

## 2020-07-26 MED ORDER — SODIUM CHLORIDE 0.9 % IV SOLN: INTRAVENOUS | Status: DC | PRN

## 2020-07-26 MED ORDER — SUCCINYLCHOLINE CHLORIDE 20 MG/ML IJ SOLN
INTRAMUSCULAR | Status: DC | PRN
  Administered 2020-07-26: 80 mg via INTRAVENOUS

## 2020-07-26 MED ORDER — SODIUM CHLORIDE 0.9 % IV SOLN
500.0000 mL | Freq: Once | INTRAVENOUS | Status: AC
  Administered 2020-07-26: 500 mL via INTRAVENOUS

## 2020-07-26 MED ORDER — SUCCINYLCHOLINE CHLORIDE 200 MG/10ML IV SOSY
PREFILLED_SYRINGE | INTRAVENOUS | Status: AC
  Filled 2020-07-26: qty 10

## 2020-07-26 MED ORDER — LABETALOL HCL 5 MG/ML IV SOLN
INTRAVENOUS | Status: DC | PRN
  Administered 2020-07-26: 10 mg via INTRAVENOUS

## 2020-07-26 MED ORDER — METHOHEXITAL SODIUM 100 MG/10ML IV SOSY
PREFILLED_SYRINGE | INTRAVENOUS | Status: DC | PRN
  Administered 2020-07-26: 80 mg via INTRAVENOUS

## 2020-07-26 NOTE — H&P (Signed)
Wanda Ochoa is an 75 y.o. female.   Chief Complaint: Patient feels that she is doing much better after the last treatment.  Anxiety and rumination are decreased HPI: History of recurrent depression and anxiety with previous episodes of catatonia.  Good response to ECT  Past Medical History:  Diagnosis Date  . Anxiety   . Arthritis    right knee  . Asthma   . HTN (hypertension)   . Thyroid disease     Past Surgical History:  Procedure Laterality Date  . ABDOMINAL HYSTERECTOMY  1990  . BLADDER SUSPENSION  1990  . BREAST CYST ASPIRATION Right 1985  . BREAST EXCISIONAL BIOPSY Right 1967   EXCISIONAL BX - NEG  . BREAST SURGERY Right 1967   Lumpectomy  . CESAREAN SECTION    . COLONOSCOPY WITH PROPOFOL N/A 12/18/2018   Procedure: COLONOSCOPY WITH PROPOFOL;  Surgeon: Lollie Sails, MD;  Location: [facility] ENDOSCOPY;  Service: Endoscopy;  Laterality: N/A;  . HEMORRHOIDECTOMY WITH HEMORRHOID BANDING  1988    Family History  Problem Relation Age of Onset  . Depression Father   . Suicidality Father   . Depression Sister   . Breast cancer Sister 34  . Autism Son   . Heart disease Mother   . Colon cancer Mother   . Hypertension Mother   . Anxiety disorder Mother   . Mental illness Daughter   . Allergies Brother   . Allergies Sister   . Asthma Maternal Uncle   . Heart disease Maternal Grandfather   . Heart disease Maternal Grandmother   . Breast cancer Paternal Grandmother   . Breast cancer Other    Social History:  reports that she has never smoked. She has never used smokeless tobacco. She reports that she does not drink alcohol and does not use drugs.  Allergies:  Allergies  Allergen Reactions  . Penicillins Anaphylaxis  . Lipitor [Atorvastatin] Rash    Severe rash on skin after taking approximately 2 weeks.     (Not in a hospital admission)   No results found for this or any previous visit (from the past 48 hour(s)). No results found.  Review of Systems   Constitutional: Negative.   HENT: Negative.   Eyes: Negative.   Respiratory: Negative.   Cardiovascular: Negative.   Gastrointestinal: Negative.   Musculoskeletal: Negative.   Skin: Negative.   Neurological: Negative.   Psychiatric/Behavioral: Negative.     Blood pressure (!) 165/70, pulse 65, temperature 98.5 F (36.9 C), temperature source Oral, resp. rate 18, height 5\' 9"  (1.753 m), weight 79.8 kg, SpO2 98 %. Physical Exam Vitals and nursing note reviewed.  Constitutional:      Appearance: She is well-developed.  HENT:     Head: Normocephalic and atraumatic.  Eyes:     Conjunctiva/sclera: Conjunctivae normal.     Pupils: Pupils are equal, round, and reactive to light.  Cardiovascular:     Heart sounds: Normal heart sounds.  Pulmonary:     Effort: Pulmonary effort is normal.  Abdominal:     Palpations: Abdomen is soft.  Musculoskeletal:        General: Normal range of motion.     Cervical back: Normal range of motion.  Skin:    General: Skin is warm and dry.  Neurological:     General: No focal deficit present.     Mental Status: She is alert.  Psychiatric:        Mood and Affect: Mood normal.  Behavior: Behavior normal.        Thought Content: Thought content normal.        Judgment: Judgment normal.      Assessment/Plan Patient request some maintenance schedule for the time being which I think seems quite reasonable we will probably try and see her back in 1 week.  [name], MD 07/26/2020, 12:29 PM

## 2020-07-26 NOTE — Procedures (Signed)
ECT SERVICES Physician's Interval Evaluation & Treatment Note  Patient Identification: Wanda Ochoa MRN:  [id number] Date of Evaluation:  07/26/2020 TX #: 5  MADRS:   MMSE:   P.E. Findings:  normal  Psychiatric Interval Note:  Mood better  Subjective:  Patient is a 75 y.o. female seen for evaluation for Electroconvulsive Therapy. Mood and anxiety improved  Treatment Summary:   [x]   Right Unilateral             []  Bilateral   % Energy : 0.67ms 75%    Impedance: 2210 ohms  Seizure Energy Index: 3600 mv2  Postictal Suppression Index: 71%  Seizure Concordance Index: 33%  Medications  Pre Shock: torodol 15mg , brevital 80mg , suc 80mg   Post Shock:    Seizure Duration: 28 sec emg 42 s eeg   Comments: Follow up one week   Lungs:  [x]   Clear to auscultation               []  Other:   Heart:    [x]   Regular rhythm             []  irregular rhythm    [x]   Previous H&P reviewed, patient examined and there are NO CHANGES                 []   Previous H&P reviewed, patient examined and there are changes noted.   [name], MD 5/4/20228:23 PM

## 2020-07-26 NOTE — Anesthesia Postprocedure Evaluation (Signed)
Anesthesia Post Note  Patient: Wanda Ochoa  Procedure(s) Performed: ECT TX  Patient location during evaluation: PACU Anesthesia Type: General Level of consciousness: awake and alert Pain management: pain level controlled Vital Signs Assessment: post-procedure vital signs reviewed and stable Respiratory status: spontaneous breathing, nonlabored ventilation, respiratory function stable and patient connected to nasal cannula oxygen Cardiovascular status: blood pressure returned to baseline and stable Postop Assessment: no apparent nausea or vomiting Anesthetic complications: no   No complications documented.   Last Vitals:  Vitals:   07/26/20 1350 07/26/20 1400  BP: (!) 180/77 (!) 174/80  Pulse:  70  Resp:  18  Temp:    SpO2: 94% 96%    Last Pain:  Vitals:   07/26/20 1400  TempSrc:   PainSc: 0-No pain                 Martha Clan

## 2020-07-26 NOTE — Anesthesia Preprocedure Evaluation (Signed)
Anesthesia Evaluation  Patient identified by MRN, date of birth, ID band Patient awake    Reviewed: Allergy & Precautions, NPO status , [name] Chart, lab work & pertinent test results  History of Anesthesia Complications Negative for: history of anesthetic complications  Airway Mallampati: II  TM Distance: >3 FB Neck ROM: Full    Dental no notable dental hx.    Pulmonary asthma ,    breath sounds clear to auscultation- rhonchi (-) wheezing      Cardiovascular Exercise Tolerance: Good hypertension, Pt. on medications (-) CAD, (-) Past MI, (-) Cardiac Stents and (-) CABG  Rhythm:Regular Rate:Normal - Systolic murmurs and - Diastolic murmurs    Neuro/Psych neg Seizures PSYCHIATRIC DISORDERS Anxiety Depression negative neurological ROS     GI/Hepatic negative GI ROS, Neg liver ROS,   Endo/Other  neg diabetesHypothyroidism   Renal/GU negative Renal ROS     Musculoskeletal  (+) Arthritis ,   Abdominal (+) - obese,   Peds  Hematology negative hematology ROS (+)   Anesthesia Other Findings Past Medical History: No date: Anxiety No date: Arthritis     Comment:  right knee No date: Asthma No date: HTN (hypertension) No date: Thyroid disease   Reproductive/Obstetrics                             Anesthesia Physical  Anesthesia Plan  ASA: III  Anesthesia Plan: General   Post-op Pain Management:    Induction: Intravenous  PONV Risk Score and Plan: 2  Airway Management Planned: Mask  Additional Equipment:   Intra-op Plan:   Post-operative Plan:   Informed Consent: I have reviewed the patients History and Physical, chart, labs and discussed the procedure including the risks, benefits and alternatives for the proposed anesthesia with the patient or authorized representative who has indicated his/her understanding and acceptance.     Dental advisory given  Plan Discussed with:  CRNA and Anesthesiologist  Anesthesia Plan Comments:         Anesthesia Quick Evaluation

## 2020-07-26 NOTE — Transfer of Care (Signed)
Immediate Anesthesia Transfer of Care Note  Patient: Wanda Ochoa  Procedure(s) Performed: ECT TX  Patient Location: PACU  Anesthesia Type:General  Level of Consciousness: awake  Airway & Oxygen Therapy: Patient connected to face mask oxygen  Post-op Assessment: Post -op Vital signs reviewed and stable  Post vital signs: stable  Last Vitals:  Vitals Value Taken Time  BP    Temp    Pulse    Resp    SpO2      Last Pain:  Vitals:   07/26/20 1026  TempSrc: Oral  PainSc: 0-No pain         Complications: No complications documented.

## 2020-07-26 NOTE — Anesthesia Procedure Notes (Signed)
Procedure Name: General with mask airway Date/Time: 07/26/2020 1:23 PM Performed by: Aline Brochure, CRNA Pre-anesthesia Checklist: Patient identified, Emergency Drugs available, Suction available and Patient being monitored Patient Re-evaluated:Patient Re-evaluated prior to induction Oxygen Delivery Method: Circle system utilized Preoxygenation: Pre-oxygenation with 100% oxygen Induction Type: IV induction Ventilation: Mask ventilation without difficulty and Mask ventilation throughout procedure Airway Equipment and Method: Bite block Placement Confirmation: positive ETCO2 Dental Injury: Teeth and Oropharynx as per pre-operative assessment

## 2020-07-31 ENCOUNTER — Encounter (HOSPITAL_BASED_OUTPATIENT_CLINIC_OR_DEPARTMENT_OTHER)
Admission: RE | Admit: 2020-07-31 | Discharge: 2020-07-31 | Disposition: A | Discharge status: Home / Self Care | Payer: Medicare Other | Source: Ambulatory Visit | Attending: Psychiatry | Admitting: Psychiatry

## 2020-07-31 ENCOUNTER — Ambulatory Visit: Payer: Self-pay | Admitting: Certified Registered Nurse Anesthetist

## 2020-07-31 ENCOUNTER — Other Ambulatory Visit: Payer: Self-pay | Admitting: Psychiatry

## 2020-07-31 ENCOUNTER — Encounter: Payer: Self-pay | Admitting: Certified Registered Nurse Anesthetist

## 2020-07-31 ENCOUNTER — Other Ambulatory Visit: Payer: Self-pay

## 2020-07-31 DIAGNOSIS — F419 Anxiety disorder, unspecified: Secondary | ICD-10-CM | POA: Diagnosis not present

## 2020-07-31 DIAGNOSIS — F332 Major depressive disorder, recurrent severe without psychotic features: Secondary | ICD-10-CM

## 2020-07-31 DIAGNOSIS — F32A Depression, unspecified: Secondary | ICD-10-CM | POA: Diagnosis not present

## 2020-07-31 DIAGNOSIS — F418 Other specified anxiety disorders: Secondary | ICD-10-CM | POA: Diagnosis not present

## 2020-07-31 MED ORDER — SODIUM CHLORIDE 0.9 % IV SOLN: INTRAVENOUS | Status: DC | PRN

## 2020-07-31 MED ORDER — SUCCINYLCHOLINE CHLORIDE 20 MG/ML IJ SOLN
INTRAMUSCULAR | Status: DC | PRN
  Administered 2020-07-31: 80 mg via INTRAVENOUS

## 2020-07-31 MED ORDER — LABETALOL HCL 5 MG/ML IV SOLN
INTRAVENOUS | Status: DC | PRN
  Administered 2020-07-31: 10 mg via INTRAVENOUS

## 2020-07-31 MED ORDER — METHOHEXITAL SODIUM 100 MG/10ML IV SOSY
PREFILLED_SYRINGE | INTRAVENOUS | Status: DC | PRN
  Administered 2020-07-31: 80 mg via INTRAVENOUS

## 2020-07-31 MED ORDER — SODIUM CHLORIDE 0.9 % IV SOLN
500.0000 mL | Freq: Once | INTRAVENOUS | Status: AC
  Administered 2020-07-31: 500 mL via INTRAVENOUS

## 2020-07-31 NOTE — H&P (Signed)
Wanda Ochoa is an 75 y.o. female.   Chief Complaint: No new complaint.  Reports mood is better with less anxiety HPI: History of recurrent anxiety and depression with improvement with ECT  Past Medical History:  Diagnosis Date  . Anxiety   . Arthritis    right knee  . Asthma   . HTN (hypertension)   . Thyroid disease     Past Surgical History:  Procedure Laterality Date  . ABDOMINAL HYSTERECTOMY  1990  . BLADDER SUSPENSION  1990  . BREAST CYST ASPIRATION Right 1985  . BREAST EXCISIONAL BIOPSY Right 1967   EXCISIONAL BX - NEG  . BREAST SURGERY Right 1967   Lumpectomy  . CESAREAN SECTION    . COLONOSCOPY WITH PROPOFOL N/A 12/18/2018   Procedure: COLONOSCOPY WITH PROPOFOL;  Surgeon: Lollie Sails, MD;  Location: [facility] END[facility]COPY;  Service: Endoscopy;  Laterality: N/A;  . HEMORRHOIDECTOMY WITH HEMORRHOID BANDING  1988    Family History  Problem Relation Age of Onset  . Depression Father   . Suicidality Father   . Depression Sister   . Breast cancer Sister 40  . Autism Son   . Heart disease Mother   . Colon cancer Mother   . Hypertension Mother   . Anxiety disorder Mother   . Mental illness Daughter   . Allergies Brother   . Allergies Sister   . Asthma Maternal Uncle   . Heart disease Maternal Grandfather   . Heart disease Maternal Grandmother   . Breast cancer Paternal Grandmother   . Breast cancer Other    Social History:  reports that she has never smoked. She has never used smokeless tobacco. She reports that she does not drink alcohol and does not use drugs.  Allergies:  Allergies  Allergen Reactions  . Penicillins Anaphylaxis  . Lipitor [Atorvastatin] Rash    Severe rash on skin after taking approximately 2 weeks.     (Not in a hospital admission)   No results found for this or any previous visit (from the past 48 hour(s)). No results found.  Review of Systems  Constitutional: Negative.   HENT: Negative.   Eyes: Negative.   Respiratory:  Negative.   Cardiovascular: Negative.   Gastrointestinal: Negative.   Musculoskeletal: Negative.   Skin: Negative.   Neurological: Negative.   Psychiatric/Behavioral: Negative.     Blood pressure (!) 158/69, pulse 78, temperature 98.3 F (36.8 C), temperature source Oral, resp. rate 18, height 5\' 9"  (1.753 m), weight 79.8 kg, SpO2 98 %. Physical Exam Vitals and nursing note reviewed.  Constitutional:      Appearance: She is well-developed.  HENT:     Head: Normocephalic and atraumatic.  Eyes:     Conjunctiva/sclera: Conjunctivae normal.     Pupils: Pupils are equal, round, and reactive to light.  Cardiovascular:     Heart sounds: Normal heart sounds.  Pulmonary:     Effort: Pulmonary effort is normal.  Abdominal:     Palpations: Abdomen is soft.  Musculoskeletal:        General: Normal range of motion.     Cervical back: Normal range of motion.  Skin:    General: Skin is warm and dry.  Neurological:     General: No focal deficit present.     Mental Status: She is alert.  Psychiatric:        Mood and Affect: Mood normal.        Behavior: Behavior normal.  Thought Content: Thought content normal.        Judgment: Judgment normal.      Assessment/Plan We had discussed before a maintenance schedule that would have her come back in 1 week and we will continue with that plan for now  [name], MD 07/31/2020, 10:57 AM

## 2020-07-31 NOTE — Anesthesia Preprocedure Evaluation (Signed)
Anesthesia Evaluation  Patient identified by MRN, date of birth, ID band Patient awake    Reviewed: Allergy & Precautions, NPO status , [name] Chart, lab work & pertinent test results  History of Anesthesia Complications Negative for: history of anesthetic complications  Airway Mallampati: II  TM Distance: >3 FB Neck ROM: Full    Dental no notable dental hx.    Pulmonary asthma ,    breath sounds clear to auscultation- rhonchi (-) wheezing      Cardiovascular Exercise Tolerance: Good hypertension, Pt. on medications + angina with exertion (-) CAD, (-) Past MI, (-) Cardiac Stents and (-) CABG  Rhythm:Regular Rate:Normal - Systolic murmurs and - Diastolic murmurs    Neuro/Psych neg Seizures PSYCHIATRIC DISORDERS Anxiety Depression negative neurological ROS     GI/Hepatic negative GI ROS, Neg liver ROS,   Endo/Other  neg diabetesHypothyroidism   Renal/GU negative Renal ROS     Musculoskeletal  (+) Arthritis , Osteoarthritis,    Abdominal (+) - obese,   Peds  Hematology negative hematology ROS (+)   Anesthesia Other Findings Past Medical History: No date: Anxiety No date: Arthritis     Comment:  right knee No date: Asthma No date: HTN (hypertension) No date: Thyroid disease   Reproductive/Obstetrics                             Anesthesia Physical  Anesthesia Plan  ASA: III  Anesthesia Plan: General   Post-op Pain Management:    Induction: Intravenous  PONV Risk Score and Plan: 2  Airway Management Planned: Mask  Additional Equipment:   Intra-op Plan:   Post-operative Plan:   Informed Consent: I have reviewed the patients History and Physical, chart, labs and discussed the procedure including the risks, benefits and alternatives for the proposed anesthesia with the patient or authorized representative who has indicated his/her understanding and acceptance.      Dental advisory given  Plan Discussed with: CRNA and Anesthesiologist  Anesthesia Plan Comments:         Anesthesia Quick Evaluation

## 2020-07-31 NOTE — Anesthesia Postprocedure Evaluation (Signed)
Anesthesia Post Note  Patient: Wanda Ochoa  Procedure(s) Performed: ECT TX  Patient location during evaluation: PACU Anesthesia Type: General Level of consciousness: awake and alert, awake and oriented Pain management: pain level controlled Vital Signs Assessment: post-procedure vital signs reviewed and stable Respiratory status: spontaneous breathing, nonlabored ventilation and respiratory function stable Cardiovascular status: blood pressure returned to baseline and stable Postop Assessment: no apparent nausea or vomiting Anesthetic complications: no   No complications documented.   Last Vitals:  Vitals:   07/31/20 1220 07/31/20 1230  BP: (!) 175/78 (!) 168/80  Pulse: 66 65  Resp: 16 17  Temp:    SpO2: 96% 98%    Last Pain:  Vitals:   07/31/20 1150  TempSrc:   PainSc: 0-No pain                 Phill Mutter

## 2020-07-31 NOTE — Transfer of Care (Signed)
Immediate Anesthesia Transfer of Care Note  Patient: Wanda Ochoa  Procedure(s) Performed: ECT TX  Patient Location: PACU  Anesthesia Type:General  Level of Consciousness: sedated  Airway & Oxygen Therapy: Patient Spontanous Breathing and Patient connected to face mask oxygen  Post-op Assessment: Report given to RN and Post -op Vital signs reviewed and stable  Post vital signs: Reviewed and stable  Last Vitals:  Vitals Value Taken Time  BP    Temp    Pulse    Resp    SpO2      Last Pain:  Vitals:   07/31/20 1010  TempSrc:   PainSc: 0-No pain         Complications: No complications documented.

## 2020-08-01 ENCOUNTER — Encounter: Payer: Medicare Other | Admitting: Family Medicine

## 2020-08-02 ENCOUNTER — Other Ambulatory Visit: Payer: Self-pay

## 2020-08-02 DIAGNOSIS — I10 Essential (primary) hypertension: Secondary | ICD-10-CM | POA: Diagnosis not present

## 2020-08-02 DIAGNOSIS — E039 Hypothyroidism, unspecified: Secondary | ICD-10-CM

## 2020-08-02 NOTE — Telephone Encounter (Signed)
[facility] faxed refill request for the following medications:  levothyroxine (SYNTHROID) 112 MCG tablet  Current med list has 100 MCG  Please advise.

## 2020-08-03 ENCOUNTER — Other Ambulatory Visit: Payer: Self-pay

## 2020-08-03 DIAGNOSIS — E039 Hypothyroidism, unspecified: Secondary | ICD-10-CM

## 2020-08-03 MED ORDER — LEVOTHYROXINE SODIUM 112 MCG PO TABS: 112.0000 ug | ORAL_TABLET | Freq: Every day | ORAL | 0 refills | Status: DC

## 2020-08-03 NOTE — Telephone Encounter (Signed)
With that TSH, her synthroid dose should have been increased.  Can anyone change it for her?  If not, we should increase to 112 mcg daily if she is currently on 100 mcg daily.  We should also plan to recheck her TSH in 2 months.

## 2020-08-03 NOTE — Telephone Encounter (Signed)
See my note above regarding this TSH and what to do with this Synthroid

## 2020-08-04 NOTE — Procedures (Signed)
ECT SERVICES Physician's Interval Evaluation & Treatment Note  Patient Identification: Wanda Ochoa MRN:  [id number] Date of Evaluation:  5/92022 TX #: 6  MADRS:   MMSE:   P.E. Findings:  No change to physical exam  Psychiatric Interval Note:  Mood stable  Subjective:  Patient is a 75 y.o. female seen for evaluation for Electroconvulsive Therapy. Improved  Treatment Summary:   [x]   Right Unilateral             []  Bilateral   % Energy : 0.3 ms 75%   Impedance: 2370 ohms  Seizure Energy Index: 5531 V squared  Postictal Suppression Index: 61%  Seizure Concordance Index: 91%  Medications  Pre Shock: Toradol 15 mg Brevital 80 mg succinylcholine 80 mg  Post Shock:    Seizure Duration: 19 seconds EMG 28 seconds EEG   Comments: Follow-up in 1 week  Lungs:  [x]   Clear to auscultation               []  Other:   Heart:    [x]   Regular rhythm             []  irregular rhythm    [x]   Previous H&P reviewed, patient examined and there are NO CHANGES                 []   Previous H&P reviewed, patient examined and there are changes noted.   [name], MD 5/9/20224:47 PM

## 2020-08-07 ENCOUNTER — Other Ambulatory Visit: Payer: Self-pay

## 2020-08-07 ENCOUNTER — Encounter: Payer: Self-pay | Admitting: Anesthesiology

## 2020-08-07 ENCOUNTER — Other Ambulatory Visit: Payer: Self-pay | Admitting: Psychiatry

## 2020-08-07 ENCOUNTER — Ambulatory Visit: Payer: Self-pay | Admitting: Anesthesiology

## 2020-08-07 ENCOUNTER — Encounter (HOSPITAL_BASED_OUTPATIENT_CLINIC_OR_DEPARTMENT_OTHER)
Admission: RE | Admit: 2020-08-07 | Discharge: 2020-08-07 | Disposition: A | Discharge status: Home / Self Care | Payer: Medicare Other | Source: Ambulatory Visit | Attending: Psychiatry | Admitting: Psychiatry

## 2020-08-07 DIAGNOSIS — F332 Major depressive disorder, recurrent severe without psychotic features: Secondary | ICD-10-CM

## 2020-08-07 DIAGNOSIS — E039 Hypothyroidism, unspecified: Secondary | ICD-10-CM | POA: Diagnosis not present

## 2020-08-07 DIAGNOSIS — F32A Depression, unspecified: Secondary | ICD-10-CM | POA: Diagnosis not present

## 2020-08-07 DIAGNOSIS — F419 Anxiety disorder, unspecified: Secondary | ICD-10-CM | POA: Diagnosis not present

## 2020-08-07 MED ORDER — LABETALOL HCL 5 MG/ML IV SOLN
INTRAVENOUS | Status: DC | PRN
  Administered 2020-08-07: 10 mg via INTRAVENOUS

## 2020-08-07 MED ORDER — KETOROLAC TROMETHAMINE 30 MG/ML IJ SOLN
15.0000 mg | Freq: Once | INTRAMUSCULAR | Status: AC
  Administered 2020-08-07: 15 mg via INTRAVENOUS

## 2020-08-07 MED ORDER — METHOHEXITAL SODIUM 100 MG/10ML IV SOSY
PREFILLED_SYRINGE | INTRAVENOUS | Status: DC | PRN
  Administered 2020-08-07: 80 mg via INTRAVENOUS

## 2020-08-07 MED ORDER — SUCCINYLCHOLINE CHLORIDE 200 MG/10ML IV SOSY
PREFILLED_SYRINGE | INTRAVENOUS | Status: AC
  Filled 2020-08-07: qty 10

## 2020-08-07 MED ORDER — SODIUM CHLORIDE 0.9 % IV SOLN
500.0000 mL | Freq: Once | INTRAVENOUS | Status: AC
  Administered 2020-08-07: 500 mL via INTRAVENOUS

## 2020-08-07 MED ORDER — SUCCINYLCHOLINE CHLORIDE 200 MG/10ML IV SOSY
PREFILLED_SYRINGE | INTRAVENOUS | Status: DC | PRN
  Administered 2020-08-07: 80 mg via INTRAVENOUS

## 2020-08-07 MED ORDER — KETOROLAC TROMETHAMINE 30 MG/ML IJ SOLN
INTRAMUSCULAR | Status: AC
  Filled 2020-08-07: qty 1

## 2020-08-07 MED ORDER — SODIUM CHLORIDE 0.9 % IV SOLN: INTRAVENOUS | Status: DC | PRN

## 2020-08-07 NOTE — Anesthesia Postprocedure Evaluation (Signed)
Anesthesia Post Note  Patient: Wanda Ochoa  Procedure(s) Performed: ECT TX  Patient location during evaluation: PACU Anesthesia Type: General Level of consciousness: awake and alert Pain management: pain level controlled Vital Signs Assessment: post-procedure vital signs reviewed and stable Respiratory status: spontaneous breathing, nonlabored ventilation, respiratory function stable and patient connected to nasal cannula oxygen Cardiovascular status: blood pressure returned to baseline and stable Postop Assessment: no apparent nausea or vomiting Anesthetic complications: no   No complications documented.   Last Vitals:  Vitals:   08/07/20 1332 08/07/20 1359  BP: (!) 174/85 (!) 170/80  Pulse: 63 60  Resp: 16 16  Temp:  36.7 C  SpO2: 92%     Last Pain:  Vitals:   08/07/20 1359  TempSrc: Oral  PainSc: 0-No pain                 Martha Clan

## 2020-08-07 NOTE — Transfer of Care (Signed)
Immediate Anesthesia Transfer of Care Note  Patient: Wanda Ochoa  Procedure(s) Performed: ECT TX  Patient Location: PACU  Anesthesia Type:General  Level of Consciousness: drowsy and patient cooperative  Airway & Oxygen Therapy: Patient Spontanous Breathing  Post-op Assessment: Report given to RN and Post -op Vital signs reviewed and stable  Post vital signs: Reviewed and stable  Last Vitals:  Vitals Value Taken Time  BP 194/84 08/07/20 1313  Temp 36.2 C 08/07/20 1313  Pulse 66 08/07/20 1316  Resp 18 08/07/20 1316  SpO2 92 % 08/07/20 1316  Vitals shown include unvalidated device data.  Last Pain:  Vitals:   08/07/20 1313  TempSrc:   PainSc: 0-No pain         Complications: No complications documented.

## 2020-08-07 NOTE — Procedures (Signed)
ECT SERVICES Physician's Interval Evaluation & Treatment Note  Patient Identification: Wanda Ochoa MRN:  [id number] Date of Evaluation:  08/07/2020 TX #: 7  MADRS:   MMSE:   P.E. Findings:  No change physical exam  Psychiatric Interval Note:  Mood is reported as continually improved with less rumination and anxiety.  She also has notable memory impairment  Subjective:  Patient is a 75 y.o. female seen for evaluation for Electroconvulsive Therapy. Notices mood improvement.  Notices some memory impairment  Treatment Summary:   [x]   Right Unilateral             []  Bilateral   % Energy : 0.3 ms 75%   Impedance: 2300 ohms  Seizure Energy Index: 7362 V squared  Postictal Suppression Index: 74%  Seizure Concordance Index: 96%  Medications  Pre Shock: 15 mg Toradol 80 mg Brevital 80 mg succinylcholine  Post Shock:    Seizure Duration: 14 seconds EMG 23 seconds EEG   Comments: Follow-up in 3 weeks  Lungs:  [x]   Clear to auscultation               []  Other:   Heart:    [x]   Regular rhythm             []  irregular rhythm    [x]   Previous H&P reviewed, patient examined and there are NO CHANGES                 []   Previous H&P reviewed, patient examined and there are changes noted.   [name], MD 5/16/20226:02 PM

## 2020-08-07 NOTE — Anesthesia Preprocedure Evaluation (Signed)
Anesthesia Evaluation  Patient identified by MRN, date of birth, ID band Patient awake    Reviewed: Allergy & Precautions, NPO status , [name] Chart, lab work & pertinent test results  History of Anesthesia Complications Negative for: history of anesthetic complications  Airway Mallampati: II  TM Distance: >3 FB Neck ROM: Full    Dental no notable dental hx.    Pulmonary asthma ,    breath sounds clear to auscultation- rhonchi (-) wheezing      Cardiovascular Exercise Tolerance: Good hypertension, Pt. on medications + angina with exertion (-) CAD, (-) Past MI, (-) Cardiac Stents and (-) CABG  Rhythm:Regular Rate:Normal - Systolic murmurs and - Diastolic murmurs    Neuro/Psych neg Seizures PSYCHIATRIC DISORDERS Anxiety Depression negative neurological ROS     GI/Hepatic negative GI ROS, Neg liver ROS,   Endo/Other  neg diabetesHypothyroidism   Renal/GU CRFRenal disease     Musculoskeletal  (+) Arthritis , Osteoarthritis,    Abdominal (+) - obese,   Peds  Hematology negative hematology ROS (+)   Anesthesia Other Findings Past Medical History: No date: Anxiety No date: Arthritis     Comment:  right knee No date: Asthma No date: HTN (hypertension) No date: Thyroid disease   Reproductive/Obstetrics                             Anesthesia Physical  Anesthesia Plan  ASA: III  Anesthesia Plan: General   Post-op Pain Management:    Induction: Intravenous  PONV Risk Score and Plan: 2  Airway Management Planned: Mask  Additional Equipment:   Intra-op Plan:   Post-operative Plan:   Informed Consent: I have reviewed the patients History and Physical, chart, labs and discussed the procedure including the risks, benefits and alternatives for the proposed anesthesia with the patient or authorized representative who has indicated his/her understanding and acceptance.     Dental  advisory given  Plan Discussed with: CRNA and Anesthesiologist  Anesthesia Plan Comments:         Anesthesia Quick Evaluation

## 2020-08-15 ENCOUNTER — Encounter: Payer: Medicare Other | Admitting: Family Medicine

## 2020-08-21 ENCOUNTER — Emergency Department (HOSPITAL_COMMUNITY): Payer: Medicare Other

## 2020-08-21 ENCOUNTER — Encounter (HOSPITAL_COMMUNITY): Payer: Self-pay | Admitting: Emergency Medicine

## 2020-08-21 ENCOUNTER — Emergency Department (HOSPITAL_COMMUNITY)
Admission: EM | Admit: 2020-08-21 | Discharge: 2020-08-21 | Disposition: A | Discharge status: Home / Self Care | Payer: Medicare Other | Attending: Emergency Medicine | Admitting: Emergency Medicine

## 2020-08-21 ENCOUNTER — Other Ambulatory Visit: Payer: Self-pay

## 2020-08-21 DIAGNOSIS — Y93H2 Activity, gardening and landscaping: Secondary | ICD-10-CM | POA: Insufficient documentation

## 2020-08-21 DIAGNOSIS — Y92096 Garden or yard of other non-institutional residence as the place of occurrence of the external cause: Secondary | ICD-10-CM | POA: Diagnosis not present

## 2020-08-21 DIAGNOSIS — W01198A Fall on same level from slipping, tripping and stumbling with subsequent striking against other object, initial encounter: Secondary | ICD-10-CM | POA: Insufficient documentation

## 2020-08-21 DIAGNOSIS — S0990XA Unspecified injury of head, initial encounter: Secondary | ICD-10-CM | POA: Diagnosis not present

## 2020-08-21 DIAGNOSIS — S0003XA Contusion of scalp, initial encounter: Secondary | ICD-10-CM | POA: Insufficient documentation

## 2020-08-21 DIAGNOSIS — E039 Hypothyroidism, unspecified: Secondary | ICD-10-CM | POA: Diagnosis not present

## 2020-08-21 DIAGNOSIS — R220 Localized swelling, mass and lump, head: Secondary | ICD-10-CM | POA: Diagnosis not present

## 2020-08-21 DIAGNOSIS — J45909 Unspecified asthma, uncomplicated: Secondary | ICD-10-CM | POA: Diagnosis not present

## 2020-08-21 DIAGNOSIS — N1831 Chronic kidney disease, stage 3a: Secondary | ICD-10-CM | POA: Diagnosis not present

## 2020-08-21 DIAGNOSIS — I10 Essential (primary) hypertension: Secondary | ICD-10-CM | POA: Diagnosis not present

## 2020-08-21 DIAGNOSIS — Z7982 Long term (current) use of aspirin: Secondary | ICD-10-CM | POA: Insufficient documentation

## 2020-08-21 DIAGNOSIS — Z79899 Other long term (current) drug therapy: Secondary | ICD-10-CM | POA: Insufficient documentation

## 2020-08-21 DIAGNOSIS — I129 Hypertensive chronic kidney disease with stage 1 through stage 4 chronic kidney disease, or unspecified chronic kidney disease: Secondary | ICD-10-CM | POA: Insufficient documentation

## 2020-08-21 DIAGNOSIS — G319 Degenerative disease of nervous system, unspecified: Secondary | ICD-10-CM | POA: Diagnosis not present

## 2020-08-21 DIAGNOSIS — W19XXXA Unspecified fall, initial encounter: Secondary | ICD-10-CM

## 2020-08-21 LAB — CBC WITH DIFFERENTIAL/PLATELET
Abs Immature Granulocytes: 0.04 10*3/uL (ref 0.00–0.07)
Basophils Absolute: 0 10*3/uL (ref 0.0–0.1)
Basophils Relative: 0 %
Eosinophils Absolute: 0.1 10*3/uL (ref 0.0–0.5)
Eosinophils Relative: 2 %
HCT: 32.1 % — ABNORMAL LOW (ref 36.0–46.0)
Hemoglobin: 10.5 g/dL — ABNORMAL LOW (ref 12.0–15.0)
Immature Granulocytes: 1 %
Lymphocytes Relative: 18 %
Lymphs Abs: 1.6 10*3/uL (ref 0.7–4.0)
MCH: 33 pg (ref 26.0–34.0)
MCHC: 32.7 g/dL (ref 30.0–36.0)
MCV: 100.9 fL — ABNORMAL HIGH (ref 80.0–100.0)
Monocytes Absolute: 0.7 10*3/uL (ref 0.1–1.0)
Monocytes Relative: 8 %
Neutro Abs: 6 10*3/uL (ref 1.7–7.7)
Neutrophils Relative %: 71 %
Platelets: 207 10*3/uL (ref 150–400)
RBC: 3.18 MIL/uL — ABNORMAL LOW (ref 3.87–5.11)
RDW: 12.3 % (ref 11.5–15.5)
WBC: 8.5 10*3/uL (ref 4.0–10.5)
nRBC: 0 % (ref 0.0–0.2)

## 2020-08-21 LAB — URINALYSIS, ROUTINE W REFLEX MICROSCOPIC
Bilirubin Urine: NEGATIVE
Glucose, UA: NEGATIVE mg/dL
Hgb urine dipstick: NEGATIVE
Ketones, ur: NEGATIVE mg/dL
Leukocytes,Ua: NEGATIVE
Nitrite: NEGATIVE
Protein, ur: NEGATIVE mg/dL
Specific Gravity, Urine: 1.01 (ref 1.005–1.030)
pH: 8 (ref 5.0–8.0)

## 2020-08-21 LAB — COMPREHENSIVE METABOLIC PANEL
ALT: 20 U/L (ref 0–44)
AST: 24 U/L (ref 15–41)
Albumin: 3.7 g/dL (ref 3.5–5.0)
Alkaline Phosphatase: 90 U/L (ref 38–126)
Anion gap: 6 (ref 5–15)
BUN: 18 mg/dL (ref 8–23)
CO2: 29 mmol/L (ref 22–32)
Calcium: 8.9 mg/dL (ref 8.9–10.3)
Chloride: 98 mmol/L (ref 98–111)
Creatinine, Ser: 1.58 mg/dL — ABNORMAL HIGH (ref 0.44–1.00)
GFR, Estimated: 34 mL/min — ABNORMAL LOW (ref >60)
Glucose, Bld: 96 mg/dL (ref 70–99)
Potassium: 4.2 mmol/L (ref 3.5–5.1)
Sodium: 133 mmol/L — ABNORMAL LOW (ref 135–145)
Total Bilirubin: 0.4 mg/dL (ref 0.3–1.2)
Total Protein: 7.1 g/dL (ref 6.5–8.1)

## 2020-08-21 NOTE — ED Provider Notes (Signed)
[place] EMERGENCY DEPARTMENT Provider Note   CSN: [id number] Arrival date & time: 08/21/20  1900     History Chief Complaint  Patient presents with  . Fall    Wanda Ochoa is a 75 y.o. female.  HPI Patient here for evaluation of fall that occurred today in addition to other falls have occurred over the last several days.  Today she was out working in the yard, and stumbled backwards, hitting her head.  She was able to get up and walk afterwards.  She came here, with her son, by private vehicle.  There has been no loss of consciousness.  She has not had any change in her medications recently.  She denies blurred vision, nausea, vomiting, neck pain, back pain, focal weakness or paresthesia.  There are no other known active modifying factors.    Past Medical History:  Diagnosis Date  . Anxiety   . Arthritis    right knee  . Asthma   . HTN (hypertension)   . Thyroid disease     Patient Active Problem List   Diagnosis Date Noted  . Drug reaction, subsequent encounter 09/23/2019  . Rash and nonspecific skin eruption 09/23/2019  . Stable angina pectoris ([place]) 09/10/2019  . Stage 3a chronic kidney disease ([place]) 09/10/2019  . Idiopathic gout involving toe of right foot 12/07/2018  . Primary osteoarthritis of right knee 03/07/2017  . Hematuria 09/11/2016  . Venous insufficiency of both lower extremities 09/04/2016  . Severe recurrent major depression without psychotic features ([place])   . Hypercholesteremia 05/02/2015  . Mitral valve prolapse 02/21/2015  . Allergic rhinitis 12/24/2014  . Osteopenia 12/24/2014  . Hypothyroidism 11/24/2014  . Generalized anxiety disorder 02/24/2012  . Nocturnal hypoxemia 01/20/2012  . Abdominal mass 01/20/2012  . Temporary cerebral vascular dysfunction 01/02/2009  . Benign hypertension 03/28/2008  . Asthma, exogenous 10/22/2007  . Adaptive colitis 03/12/2003    Past Surgical History:  Procedure Laterality Date  .  ABDOMINAL HYSTERECTOMY  1990  . BLADDER SUSPENSION  1990  . BREAST CYST ASPIRATION Right 1985  . BREAST EXCISIONAL BIOPSY Right 1967   EXCISIONAL BX - NEG  . BREAST SURGERY Right 1967   Lumpectomy  . CESAREAN SECTION    . COLONOSCOPY WITH PROPOFOL N/A 12/18/2018   Procedure: COLONOSCOPY WITH PROPOFOL;  Surgeon: Lollie Sails, MD;  Location: [facility] ENDOSCOPY;  Service: Endoscopy;  Laterality: N/A;  . HEMORRHOIDECTOMY WITH HEMORRHOID BANDING  1988     OB History    Gravida  5   Para  3   Term      Preterm      AB      Living        SAB      IAB      Ectopic      Multiple      Live Births              Family History  Problem Relation Age of Onset  . Depression Father   . Suicidality Father   . Depression Sister   . Breast cancer Sister 42  . Autism Son   . Heart disease Mother   . Colon cancer Mother   . Hypertension Mother   . Anxiety disorder Mother   . Mental illness Daughter   . Allergies Brother   . Allergies Sister   . Asthma Maternal Uncle   . Heart disease Maternal Grandfather   . Heart disease Maternal Grandmother   .  Breast cancer Paternal Grandmother   . Breast cancer Other     Social History   Tobacco Use  . Smoking status: Never Smoker  . Smokeless tobacco: Never Used  Vaping Use  . Vaping Use: Never used  Substance Use Topics  . Alcohol use: No  . Drug use: No    Home Medications Prior to Admission medications   Medication Sig Start Date End Date Taking? Authorizing Provider  albuterol (VENTOLIN HFA) 108 (90 Base) MCG/ACT inhaler INHALE 2 PUFFS INTO THE LUNGS EVERY 6 HOURS AS NEEDED 08/10/19   Bacigalupo, Dionne Bucy, MD  amLODipine (NORVASC) 5 MG tablet Take 5 mg by mouth 2 (two) times daily.     [provider]  aspirin 81 MG tablet Take 81 mg by mouth daily.    [provider]  citalopram (CELEXA) 10 MG tablet Take 10 mg by mouth daily. 08/08/19   [provider]  hydrochlorothiazide (HYDRODIURIL)  12.5 MG tablet TAKE 1 TABLET(12.5 MG) BY MOUTH DAILY 06/09/20   Virginia Crews, MD  hydrOXYzine (ATARAX/VISTARIL) 25 MG tablet Take 25 mg by mouth 4 (four) times daily as needed.    [provider]  KRILL OIL PO Take 1 capsule by mouth daily. Reported on 06/02/2015    [provider]  levothyroxine (SYNTHROID) 112 MCG tablet Take 1 tablet (112 mcg total) by mouth daily. 08/03/20   Virginia Crews, MD  lisinopril (PRINIVIL,ZESTRIL) 20 MG tablet Take 20 mg by mouth 2 (two) times daily.     [provider]  metoprolol tartrate (LOPRESSOR) 50 MG tablet Take 1 tablet (50 mg total) by mouth 2 (two) times daily. Patient taking differently: Take 25 mg by mouth 2 (two) times daily. 06/01/19   Virginia Crews, MD  Multiple Vitamin (MULTIVITAMIN) capsule Take 1 capsule by mouth daily.    [provider]  nortriptyline (PAMELOR) 25 MG capsule Take 4 capsule at bed time Patient taking differently: Take 3 capsule at bed time 04/08/12   Arfeen, Arlyce Harman, MD  Omega 3-6-9 CAPS Take by mouth daily.     [provider]  polyethylene glycol (MIRALAX / GLYCOLAX) 17 g packet Take 17 g by mouth daily. Mix in 4-8 oz of fluid    [provider]  QUEtiapine (SEROQUEL) 100 MG tablet Take 200 mg by mouth at bedtime.     [provider]    Allergies    Penicillins and Lipitor [atorvastatin]  Review of Systems   Review of Systems  All other systems reviewed and are negative.   Physical Exam Updated Vital Signs BP (!) 162/86   Pulse 69   Temp 99.2 F (37.3 C)   Resp 16   SpO2 97%   Physical Exam Vitals and nursing note reviewed.  Constitutional:      General: She is not in acute distress.    Appearance: She is well-developed. She is not ill-appearing, toxic-appearing or diaphoretic.  HENT:     Head: Normocephalic.     Comments: Contusion mid parietal without abrasion or bleeding.  No associated crepitation.    Right Ear: External ear  normal.     Left Ear: External ear normal.  Eyes:     Conjunctiva/sclera: Conjunctivae normal.     Pupils: Pupils are equal, round, and reactive to light.  Neck:     Trachea: Phonation normal.  Cardiovascular:     Rate and Rhythm: Normal rate and regular rhythm.     Heart sounds: Normal heart  sounds.  Pulmonary:     Effort: Pulmonary effort is normal.     Breath sounds: Normal breath sounds.  Abdominal:     General: There is no distension.     Palpations: Abdomen is soft.     Tenderness: There is no abdominal tenderness.  Musculoskeletal:        General: Normal range of motion.     Cervical back: Normal range of motion and neck supple.     Comments: Normal strength, arms and legs bilaterally  Skin:    General: Skin is warm and dry.  Neurological:     Mental Status: She is alert and oriented to person, place, and time.     Cranial Nerves: No cranial nerve deficit.     Sensory: No sensory deficit.     Motor: No abnormal muscle tone.     Coordination: Coordination normal.     Comments: No dysarthria, aphasia or nystagmus.  No ataxia.  With standing, she can support weight on both legs but tends to drift backwards and requires a hand to support her.  Normal deep tendon reflexes, knees bilaterally.  Normal sensation in hands and feet bilaterally.  Psychiatric:        Mood and Affect: Mood normal.        Behavior: Behavior normal.        Thought Content: Thought content normal.        Judgment: Judgment normal.     ED Results / Procedures / Treatments   Labs (all labs ordered are listed, but only abnormal results are displayed) Labs Reviewed  COMPREHENSIVE METABOLIC PANEL - Abnormal; Notable for the following components:      Result Value   Sodium 133 (*)    Creatinine, Ser 1.58 (*)    GFR, Estimated 34 (*)    All other components within normal limits  CBC WITH DIFFERENTIAL/PLATELET - Abnormal; Notable for the following components:   RBC 3.18 (*)    Hemoglobin 10.5 (*)     HCT 32.1 (*)    MCV 100.9 (*)    All other components within normal limits  URINALYSIS, ROUTINE W REFLEX MICROSCOPIC    EKG EKG Interpretation  Date/Time:  Monday Aug 21 2020 19:23:15 EDT Ventricular Rate:  67 PR Interval:  210 QRS Duration: 106 QT Interval:  448 QTC Calculation: 473 R Axis:   44 Text Interpretation: Sinus rhythm with sinus arrhythmia with 1st degree A-V block Otherwise normal ECG since last tracing no significant change Confirmed by Daleen Bo [phone]) on 08/21/2020 10:03:39 PM   Radiology CT Head Wo Contrast  Result Date: 08/21/2020 CLINICAL DATA:  Recent fall EXAM: CT HEAD WITHOUT CONTRAST TECHNIQUE: Contiguous axial images were obtained from the base of the skull through the vertex without intravenous contrast. COMPARISON:  06/07/2019 FINDINGS: Brain: No evidence of acute infarction, hemorrhage, hydrocephalus, extra-axial collection or mass lesion/mass effect. Mild atrophic changes and chronic white matter ischemic changes are seen. The overall appearance is stable from the prior exam. Vascular: No hyperdense vessel or unexpected calcification. Skull: Normal. Negative for fracture or focal lesion. Sinuses/Orbits: No acute finding. Other: Mild scalp thickening is noted posteriorly near the vertex consistent with the recent injury. IMPRESSION: Chronic atrophic and ischemic changes without acute abnormality. Mild scalp swelling consistent with the recent injury. Electronically Signed   By: Inez Catalina M.D.   On: 08/21/2020 19:33    Procedures Procedures   Medications Ordered in ED Medications - No data to display  ED Course  I have reviewed the triage vital signs and the nursing notes.  Pertinent labs & imaging results that were available during my care of the patient were reviewed by me and considered in my medical decision making (see chart for details).    MDM Rules/Calculators/A&P                           Patient Vitals for the past 24 hrs:  BP Temp  Pulse Resp SpO2  08/21/20 2045 (!) 162/86 -- 69 -- 97 %  08/21/20 2030 (!) 157/75 -- 71 -- 97 %  08/21/20 2015 (!) 153/92 -- 71 -- 96 %  08/21/20 2000 (!) 152/81 -- 69 16 99 %  08/21/20 1909 (!) 167/78 99.2 F (37.3 C) 64 18 100 %    10:16 PM Reevaluation with update and discussion. After initial assessment and treatment, an updated evaluation reveals findings discussed with patient, son at bedside.  I explained that I could not tell why she was having trouble standing and walking, and that further testing and treatment will be necessary.  They prefer to do that as an outpatient despite my offering to have it done tonight.  They state that the patient will follow-up with her PCP this week for further care and treatment.Daleen Bo   Medical Decision Making:  This patient is presenting for evaluation of difficulty walking and fall, which does require a range of treatment options, and is a complaint that involves a moderate risk of morbidity and mortality. The differential diagnoses include acute injury, metabolic disorder, hemodynamic instability, gait disorder. I decided to review old records, and in summary elderly female presenting with her son for evaluation of difficulty walking multiple falls.  Onset over the last several days..  I obtained additional historical information from son at bedside.  Clinical Laboratory Tests Ordered, included CBC, Metabolic panel and Urinalysis. Review indicates normal except mild renal insufficiency and hemoglobin slightly lower than prior. Radiologic Tests Ordered, included CT head.  I independently Visualized: Radiograph images, which show no acute injury or intracranial lesion   Critical Interventions-clinical evaluation, laboratory testing, CT imaging, observation reassessment  After These Interventions, the Patient was reevaluated and was found stable for discharge.  Patient family understand that she has not been fully diagnosed, and will need  further evaluation and follow-up by her PCP.  Her abnormal laboratory test did not appear to account for the multiple falls.  Doubt serious bacterial infection, metabolic instability or impending vascular collapse.  Stable for discharge, with close follow-up by PCP in a few days.  CRITICAL CARE-no Performed by: Daleen Bo  Nursing Notes Reviewed/ Care Coordinated Applicable Imaging Reviewed Interpretation of Laboratory Data incorporated into ED treatment  The patient appears reasonably screened and/or stabilized for discharge and I doubt any other medical condition or other [facility] requiring further screening, evaluation, or treatment in the ED at this time prior to discharge.  Plan: Home Medications-continue usual; Home Treatments-rest, fluids; return here if the recommended treatment, does not improve the symptoms; Recommended follow up-PCP Follow-up 1 to 2 days for further evaluation treatment     Final Clinical Impression(s) / ED Diagnoses Final diagnoses:  Injury of head, initial encounter  Fall, initial encounter    Rx / DC Orders ED Discharge Orders    None       Daleen Bo, MD 08/21/20 2227

## 2020-08-21 NOTE — Discharge Instructions (Addendum)
There were no signs of serious injury from the fall.  Use ice on the sore area of your head 2 or 3 times a day until the swelling improves.  Take Tylenol for pain.  Follow-up with your doctor for further care and treatment to help determine the cause of the repeated falling.  Do not get up without assistance.  Make sure you are taking your regular medicines, getting plenty of sleep, and try to eat and drink regularly.

## 2020-08-21 NOTE — ED Provider Notes (Signed)
Emergency Medicine Provider Triage Evaluation Note  Wanda Ochoa , a 75 y.o. female  was evaluated in triage.  Pt complains of frequent falls over the last few days. Has hit head, denies loc. Feels like her knees are giving out on her. Not anticoagulated  Review of Systems  Positive: Head injury Negative: Chest pain, sob  Physical Exam  BP (!) 167/78 (BP Location: Left Arm)   Pulse 64   Temp 99.2 F (37.3 C)   Resp 18   SpO2 100%  Gen:   Awake, no distress   Resp:  Normal effort  MSK:   Moves extremities without difficulty  Other:  Hematoma noted to the posterior scalp  Medical Decision Making  Medically screening exam initiated at 7:15 PM.  Appropriate orders placed.  Wanda Ochoa was informed that the remainder of the evaluation will be completed by another provider, this initial triage assessment does not replace that evaluation, and the importance of remaining in the ED until their evaluation is complete.     Bishop Dublin 08/21/20 1917    Tegeler, Gwenyth Allegra, MD 08/21/20 2258

## 2020-08-21 NOTE — ED Notes (Signed)
RN reviewed discharge instructions w/ pt. Follow up care reviewed, pt had no further questions

## 2020-08-21 NOTE — ED Triage Notes (Addendum)
Patient with recent falls.  She denies any pain or dizziness from before the falls.  She states she has fallen twice today and once yesterday.  She has a knot on the back of her head.  She denies any LOC.  She has full recall of incidents.  Patient states that she thinks that it is because of her psych meds.  She thinks she has TD.  Patient states that she is on one baby ASA daily.

## 2020-08-23 DIAGNOSIS — F5105 Insomnia due to other mental disorder: Secondary | ICD-10-CM | POA: Diagnosis not present

## 2020-08-23 DIAGNOSIS — F331 Major depressive disorder, recurrent, moderate: Secondary | ICD-10-CM | POA: Diagnosis not present

## 2020-08-23 DIAGNOSIS — F411 Generalized anxiety disorder: Secondary | ICD-10-CM | POA: Diagnosis not present

## 2020-08-24 ENCOUNTER — Ambulatory Visit: Payer: Self-pay | Admitting: *Deleted

## 2020-08-24 DIAGNOSIS — S0990XD Unspecified injury of head, subsequent encounter: Secondary | ICD-10-CM

## 2020-08-24 DIAGNOSIS — R42 Dizziness and giddiness: Secondary | ICD-10-CM

## 2020-08-24 NOTE — Telephone Encounter (Signed)
Referral placed. Patient advised. 

## 2020-08-24 NOTE — Addendum Note (Signed)
Addended by: Shawna Orleans on: 08/24/2020 04:47 PM   Modules accepted: Orders

## 2020-08-24 NOTE — Telephone Encounter (Signed)
Looks like she is seeing Simona Huh 6/14, but we could go ahead and refer to Neuro per ER rec for her dizziness as our availability is poor at the moment.

## 2020-08-24 NOTE — Telephone Encounter (Signed)
Pt reports "Floaty feeling like my head is 2 feet above me at times." Resulted in fall this past Sunday and fell twice Monday, "Just lost balance." Seen in ED, "All scans negative." Skin tear left arm and "Two bumps on my head from fall." Pt states onset 1 month ago but only recent falls. Denies spinning, needs assist to walk at times when occurs. States is not positional.Denies any noted changes in HR, no palpitations when occur. States has been receiving ECT treatments, "Psych doctor thinks maybe causing some of this so bumped back next appt 1 week." Last treatment 08/07/20. Pt states told in ED she needed referral to neurology. No availability in 4 hour timeframe per protocol. Assured pt NT would route to practice for PCPs review and final disposition. Advised ED if occurs again. Son lives with pt and is with her. Please advise: CB# [phone]  Reason for Disposition . [1] MODERATE weakness (i.e., interferes with work, school, normal activities) AND [2] new-onset or worsening  Answer Assessment - Initial Assessment Questions 1. MECHANISM: "How did the fall happen?"     "Felt floaty" 2. DOMESTIC VIOLENCE AND ELDER ABUSE SCREENING: "Did you fall because someone pushed you or tried to hurt you?" If Yes, ask: "Are you safe now?"     no 3. ONSET: "When did the fall happen?" (e.g., minutes, hours, or days ago)     Once Sunday, 2 times Monday 4. LOCATION: "What part of the body hit the ground?" (e.g., back, buttocks, head, hips, knees, hands, head, stomach)    HEad, went to ED 5. INJURY: "Did you hurt (injure) yourself when you fell?" If Yes, ask: "What did you injure? Tell me more about this?" (e.g., body area; type of injury; pain severity)"     Bump on head 6. PAIN: "Is there any pain?" If Yes, ask: "How bad is the pain?" (e.g., Scale 1-10; or mild,  moderate, severe)   - NONE (0): No pain   - MILD (1-3): Doesn't interfere with normal activities    - MODERATE (4-7): Interferes with normal  activities or awakens from sleep    - SEVERE (8-10): Excruciating pain, unable to do any normal activities      No pain 7. SIZE: For cuts, bruises, or swelling, ask: "How large is it?" (e.g., inches or centimeters)      Skin tear left arm  9. OTHER SYMPTOMS: "Do you have any other symptoms?" (e.g., dizziness, fever, weakness; new onset or worsening).      Lightheaded at times 10. CAUSE: "What do you think caused the fall (or falling)?" (e.g., tripped, dizzy spell)       Not sure  Protocols used: FALLS AND FALLING-A-AH

## 2020-08-25 DIAGNOSIS — I10 Essential (primary) hypertension: Secondary | ICD-10-CM | POA: Diagnosis not present

## 2020-08-25 DIAGNOSIS — E782 Mixed hyperlipidemia: Secondary | ICD-10-CM | POA: Diagnosis not present

## 2020-08-25 DIAGNOSIS — I872 Venous insufficiency (chronic) (peripheral): Secondary | ICD-10-CM | POA: Diagnosis not present

## 2020-08-28 ENCOUNTER — Emergency Department
Admission: EM | Admit: 2020-08-28 | Discharge: 2020-08-28 | Disposition: A | Discharge status: Home / Self Care | Payer: Medicare Other | Source: Home / Self Care | Attending: Emergency Medicine | Admitting: Emergency Medicine

## 2020-08-28 ENCOUNTER — Other Ambulatory Visit: Payer: Self-pay

## 2020-08-28 ENCOUNTER — Emergency Department: Payer: Medicare Other

## 2020-08-28 DIAGNOSIS — E039 Hypothyroidism, unspecified: Secondary | ICD-10-CM | POA: Insufficient documentation

## 2020-08-28 DIAGNOSIS — I10 Essential (primary) hypertension: Secondary | ICD-10-CM | POA: Diagnosis not present

## 2020-08-28 DIAGNOSIS — Z20822 Contact with and (suspected) exposure to covid-19: Secondary | ICD-10-CM | POA: Diagnosis not present

## 2020-08-28 DIAGNOSIS — W1830XA Fall on same level, unspecified, initial encounter: Secondary | ICD-10-CM | POA: Insufficient documentation

## 2020-08-28 DIAGNOSIS — F332 Major depressive disorder, recurrent severe without psychotic features: Secondary | ICD-10-CM | POA: Diagnosis not present

## 2020-08-28 DIAGNOSIS — S0990XA Unspecified injury of head, initial encounter: Secondary | ICD-10-CM

## 2020-08-28 DIAGNOSIS — R296 Repeated falls: Secondary | ICD-10-CM | POA: Diagnosis not present

## 2020-08-28 DIAGNOSIS — E871 Hypo-osmolality and hyponatremia: Secondary | ICD-10-CM | POA: Diagnosis not present

## 2020-08-28 DIAGNOSIS — R531 Weakness: Secondary | ICD-10-CM | POA: Diagnosis not present

## 2020-08-28 DIAGNOSIS — N1831 Chronic kidney disease, stage 3a: Secondary | ICD-10-CM | POA: Insufficient documentation

## 2020-08-28 DIAGNOSIS — I129 Hypertensive chronic kidney disease with stage 1 through stage 4 chronic kidney disease, or unspecified chronic kidney disease: Secondary | ICD-10-CM | POA: Insufficient documentation

## 2020-08-28 DIAGNOSIS — F061 Catatonic disorder due to known physiological condition: Secondary | ICD-10-CM | POA: Diagnosis not present

## 2020-08-28 DIAGNOSIS — R9082 White matter disease, unspecified: Secondary | ICD-10-CM | POA: Diagnosis not present

## 2020-08-28 DIAGNOSIS — M47812 Spondylosis without myelopathy or radiculopathy, cervical region: Secondary | ICD-10-CM | POA: Diagnosis not present

## 2020-08-28 DIAGNOSIS — Z79899 Other long term (current) drug therapy: Secondary | ICD-10-CM | POA: Insufficient documentation

## 2020-08-28 DIAGNOSIS — W19XXXA Unspecified fall, initial encounter: Secondary | ICD-10-CM

## 2020-08-28 DIAGNOSIS — J984 Other disorders of lung: Secondary | ICD-10-CM | POA: Diagnosis not present

## 2020-08-28 DIAGNOSIS — Z043 Encounter for examination and observation following other accident: Secondary | ICD-10-CM | POA: Diagnosis not present

## 2020-08-28 DIAGNOSIS — Z7982 Long term (current) use of aspirin: Secondary | ICD-10-CM | POA: Insufficient documentation

## 2020-08-28 DIAGNOSIS — R519 Headache, unspecified: Secondary | ICD-10-CM | POA: Insufficient documentation

## 2020-08-28 DIAGNOSIS — J45901 Unspecified asthma with (acute) exacerbation: Secondary | ICD-10-CM | POA: Insufficient documentation

## 2020-08-28 DIAGNOSIS — Y990 Civilian activity done for income or pay: Secondary | ICD-10-CM | POA: Insufficient documentation

## 2020-08-28 DIAGNOSIS — M7989 Other specified soft tissue disorders: Secondary | ICD-10-CM | POA: Diagnosis not present

## 2020-08-28 LAB — BASIC METABOLIC PANEL
Anion gap: 8 (ref 5–15)
BUN: 24 mg/dL — ABNORMAL HIGH (ref 8–23)
CO2: 26 mmol/L (ref 22–32)
Calcium: 8.9 mg/dL (ref 8.9–10.3)
Chloride: 100 mmol/L (ref 98–111)
Creatinine, Ser: 1.73 mg/dL — ABNORMAL HIGH (ref 0.44–1.00)
GFR, Estimated: 31 mL/min — ABNORMAL LOW (ref >60)
Glucose, Bld: 97 mg/dL (ref 70–99)
Potassium: 5.1 mmol/L (ref 3.5–5.1)
Sodium: 134 mmol/L — ABNORMAL LOW (ref 135–145)

## 2020-08-28 LAB — CBC WITH DIFFERENTIAL/PLATELET
Abs Immature Granulocytes: 0.02 10*3/uL (ref 0.00–0.07)
Basophils Absolute: 0 10*3/uL (ref 0.0–0.1)
Basophils Relative: 1 %
Eosinophils Absolute: 0.1 10*3/uL (ref 0.0–0.5)
Eosinophils Relative: 2 %
HCT: 27.8 % — ABNORMAL LOW (ref 36.0–46.0)
Hemoglobin: 9.2 g/dL — ABNORMAL LOW (ref 12.0–15.0)
Immature Granulocytes: 0 %
Lymphocytes Relative: 30 %
Lymphs Abs: 1.9 10*3/uL (ref 0.7–4.0)
MCH: 32.5 pg (ref 26.0–34.0)
MCHC: 33.1 g/dL (ref 30.0–36.0)
MCV: 98.2 fL (ref 80.0–100.0)
Monocytes Absolute: 0.6 10*3/uL (ref 0.1–1.0)
Monocytes Relative: 10 %
Neutro Abs: 3.5 10*3/uL (ref 1.7–7.7)
Neutrophils Relative %: 57 %
Platelets: 207 10*3/uL (ref 150–400)
RBC: 2.83 MIL/uL — ABNORMAL LOW (ref 3.87–5.11)
RDW: 12.7 % (ref 11.5–15.5)
WBC: 6.2 10*3/uL (ref 4.0–10.5)
nRBC: 0 % (ref 0.0–0.2)

## 2020-08-28 MED ORDER — SODIUM CHLORIDE 0.9 % IV BOLUS
1000.0000 mL | Freq: Once | INTRAVENOUS | Status: AC
  Administered 2020-08-28: 1000 mL via INTRAVENOUS

## 2020-08-28 NOTE — ED Triage Notes (Signed)
Pt comes with c/o fall. Medical alert was called in hospital and [facility] responded to alert. Pt is A*O X4.  Pt has bruise to right cheek.  Pt states she was trying to sit down and her knees got wobbly. Pt denies any LOC or hitting head. Pt takes daily aspirin.

## 2020-08-28 NOTE — ED Provider Notes (Signed)
[facility] Emergency Department Provider Note   ____________________________________________   Event Date/Time   First MD Initiated Contact with Patient 08/28/20 1249     (approximate)  I have reviewed the triage vital signs and the nursing notes.   HISTORY  Chief Complaint Fall    HPI Wanda Ochoa is a 75 y.o. female with past medical history of hypertension, hyperlipidemia, CKD, and asthma who presents to the ED complaining of fall.  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  She denies losing consciousness and does not take any blood thinners beyond a daily baby aspirin.  She currently complains of headache but denies any neck pain, back pain, extremity pain, chest, or abdomen pain.  She does state that she has been dealing with increasing falls over the past couple of weeks and on each occasion her legs seem to give out on her.  She denies any focal numbness or weakness, has not had any changes in her vision or speech.  She has not noticed any blood in her stool and denies any dysuria, nausea, or vomiting.  She has an appointment scheduled with a neurologist later this week for further evaluation of falls.        Past Medical History:  Diagnosis Date  . Anxiety   . Arthritis    right knee  . Asthma   . HTN (hypertension)   . Thyroid disease     Patient Active Problem List   Diagnosis Date Noted  . Drug reaction, subsequent encounter 09/23/2019  . Rash and nonspecific skin eruption 09/23/2019  . Stable angina pectoris ([place]) 09/10/2019  . Stage 3a chronic kidney disease ([place]) 09/10/2019  . Idiopathic gout involving toe of right foot 12/07/2018  . Primary osteoarthritis of right knee 03/07/2017  . Hematuria 09/11/2016  . Venous insufficiency of both lower extremities 09/04/2016  . Severe recurrent major depression  without psychotic features ([place])   . Hypercholesteremia 05/02/2015  . Mitral valve prolapse 02/21/2015  . Allergic rhinitis 12/24/2014  . Osteopenia 12/24/2014  . Hypothyroidism 11/24/2014  . Generalized anxiety disorder 02/24/2012  . Nocturnal hypoxemia 01/20/2012  . Abdominal mass 01/20/2012  . Temporary cerebral vascular dysfunction 01/02/2009  . Benign hypertension 03/28/2008  . Asthma, exogenous 10/22/2007  . Adaptive colitis 03/12/2003    Past Surgical History:  Procedure Laterality Date  . ABDOMINAL HYSTERECTOMY  1990  . BLADDER SUSPENSION  1990  . BREAST CYST ASPIRATION Right 1985  . BREAST EXCISIONAL BIOPSY Right 1967   EXCISIONAL BX - NEG  . BREAST SURGERY Right 1967   Lumpectomy  . CESAREAN SECTION    . COLONOSCOPY WITH PROPOFOL N/A 12/18/2018   Procedure: COLONOSCOPY WITH PROPOFOL;  Surgeon: Lollie Sails, MD;  Location: [facility] ENDOSCOPY;  Service: Endoscopy;  Laterality: N/A;  . [place]    Prior to Admission medications   Medication Sig Start Date End Date Taking? Authorizing Provider  albuterol (VENTOLIN HFA) 108 (90 Base) MCG/ACT inhaler INHALE 2 PUFFS INTO THE LUNGS EVERY 6 HOURS AS NEEDED 08/10/19   Bacigalupo, Dionne Bucy, MD  amLODipine (NORVASC) 5 MG tablet Take 5 mg by mouth 2 (two) times daily.     [provider]  aspirin 81 MG tablet Take 81 mg by mouth daily.    [provider]  citalopram (CELEXA) 10 MG tablet  Take 10 mg by mouth daily. 08/08/19   [provider]  hydrochlorothiazide (HYDRODIURIL) 12.5 MG tablet TAKE 1 TABLET(12.5 MG) BY MOUTH DAILY 06/09/20   Virginia Crews, MD  hydrOXYzine (ATARAX/VISTARIL) 25 MG tablet Take 25 mg by mouth 4 (four) times daily as needed.    [provider]  KRILL OIL PO Take 1 capsule by mouth daily. Reported on 06/02/2015    [provider]  levothyroxine (SYNTHROID) 112 MCG tablet Take 1 tablet (112 mcg total) by mouth daily.  08/03/20   Virginia Crews, MD  lisinopril (PRINIVIL,ZESTRIL) 20 MG tablet Take 20 mg by mouth 2 (two) times daily.     [provider]  metoprolol tartrate (LOPRESSOR) 50 MG tablet Take 1 tablet (50 mg total) by mouth 2 (two) times daily. Patient taking differently: Take 25 mg by mouth 2 (two) times daily. 06/01/19   Virginia Crews, MD  Multiple Vitamin (MULTIVITAMIN) capsule Take 1 capsule by mouth daily.    [provider]  nortriptyline (PAMELOR) 25 MG capsule Take 4 capsule at bed time Patient taking differently: Take 3 capsule at bed time 04/08/12   Arfeen, Arlyce Harman, MD  Omega 3-6-9 CAPS Take by mouth daily.     [provider]  polyethylene glycol (MIRALAX / GLYCOLAX) 17 g packet Take 17 g by mouth daily. Mix in 4-8 oz of fluid    [provider]  QUEtiapine (SEROQUEL) 100 MG tablet Take 200 mg by mouth at bedtime.     [provider]    Allergies Penicillins and Lipitor [atorvastatin]  Family History  Problem Relation Age of Onset  . Depression Father   . Suicidality Father   . Depression Sister   . Breast cancer Sister 9  . Autism Son   . Heart disease Mother   . Colon cancer Mother   . Hypertension Mother   . Anxiety disorder Mother   . Mental illness Daughter   . Allergies Brother   . Allergies Sister   . Asthma Maternal Uncle   . Heart disease Maternal Grandfather   . Heart disease Maternal Grandmother   . Breast cancer Paternal Grandmother   . Breast cancer Other     Social History Social History   Tobacco Use  . Smoking status: Never Smoker  . Smokeless tobacco: Never Used  Vaping Use  . Vaping Use: Never used  Substance Use Topics  . Alcohol use: No  . Drug use: No    Review of Systems  Constitutional: No fever/chills.  Positive for generalized weakness. Eyes: No visual changes. ENT: No sore throat. Cardiovascular: Denies chest pain. Respiratory: Denies shortness of breath. Gastrointestinal: No  abdominal pain.  No nausea, no vomiting.  No diarrhea.  No constipation. Genitourinary: Negative for dysuria. Musculoskeletal: Negative for back pain. Skin: Negative for rash. Neurological: Positive for headache, negative for focal weakness or numbness.  ____________________________________________   PHYSICAL EXAM:  VITAL SIGNS: ED Triage Vitals  Enc Vitals Group     BP 08/28/20 1251 (!) 158/72     Pulse Rate 08/28/20 1251 67     Resp 08/28/20 1251 19     Temp 08/28/20 1251 98.5 F (36.9 C)     Temp src --      SpO2 08/28/20 1251 98 %     Weight --      Height --      Head Circumference --      [facility] --  Pain Score 08/28/20 1247 0     Pain Loc --      Pain Edu? --      Excl. in [place]? --     Constitutional: Alert and oriented. Eyes: Conjunctivae are normal. Head: Atraumatic. Nose: No congestion/rhinnorhea. Mouth/Throat: Mucous membranes are moist. Neck: Normal ROM, no midline cervical spine tenderness to palpation. Cardiovascular: Normal rate, regular rhythm. Grossly normal heart sounds. Respiratory: Normal respiratory effort.  No retractions. Lungs CTAB. Gastrointestinal: Soft and nontender. No distention. Genitourinary: deferred Musculoskeletal: No lower extremity tenderness nor edema. Neurologic:  Normal speech and language. No gross focal neurologic deficits are appreciated. Skin:  Skin is warm, dry and intact. No rash noted. Psychiatric: Mood and affect are normal. Speech and behavior are normal.  ____________________________________________   LABS (all labs ordered are listed, but only abnormal results are displayed)  Labs Reviewed  CBC WITH DIFFERENTIAL/PLATELET - Abnormal; Notable for the following components:      Result Value   RBC 2.83 (*)    Hemoglobin 9.2 (*)    HCT 27.8 (*)    All other components within normal limits  BASIC METABOLIC PANEL - Abnormal; Notable for the following components:   Sodium 134 (*)    BUN 24 (*)    Creatinine,  Ser 1.73 (*)    GFR, Estimated 31 (*)    All other components within normal limits   ____________________________________________  EKG  ED ECG REPORT I, Blake Divine, the attending physician, personally viewed and interpreted this ECG.   Date: 08/28/2020  EKG Time: 13:22  Rate: 73  Rhythm: normal sinus rhythm  Axis: Normal  Intervals:first-degree A-V block   ST&T Change: None   PROCEDURES  Procedure(s) performed (including Critical Care):  Procedures   ____________________________________________   INITIAL IMPRESSION / ASSESSMENT AND PLAN / ED COURSE       75 year old female with past medical history of hypertension, hyperlipidemia, asthma, and CKD who presents to the ED with generalized weakness and multiple recent falls.  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  She has no focal neurologic deficits on exam, denies losing consciousness following the fall.  CT head and cervical spine are negative for acute process.  EKG shows no evidence of arrhythmia or ischemia.  Labs remarkable for mild AKI and hemoglobin slightly downtrending from previous.  Patient denies any recent bleeding and rectal exam is negative for occult blood.  Patient is appropriate for discharge home with close PCP follow-up, was counseled to have her hemoglobin and renal function rechecked at her upcoming visit.  She was counseled to return to the ED for new or worsening symptoms, patient agrees with plan.      ____________________________________________   FINAL CLINICAL IMPRESSION(S) / ED DIAGNOSES  Final diagnoses:  Fall, initial encounter  Injury of head, initial encounter     ED Discharge Orders    None       Note:  This document was prepared using Dragon voice recognition software and may include unintentional dictation errors.   Blake Divine, MD 08/28/20 [phone]

## 2020-08-29 ENCOUNTER — Emergency Department: Payer: Medicare Other

## 2020-08-29 ENCOUNTER — Inpatient Hospital Stay
Admission: EM | Admit: 2020-08-29 | Discharge: 2020-09-04 | DRG: 092 | Disposition: A | Discharge status: Skilled Nursing Facility | Payer: Medicare Other | Attending: Internal Medicine | Admitting: Internal Medicine

## 2020-08-29 ENCOUNTER — Other Ambulatory Visit: Payer: Self-pay

## 2020-08-29 ENCOUNTER — Observation Stay: Payer: Medicare Other

## 2020-08-29 ENCOUNTER — Ambulatory Visit: Payer: Self-pay | Admitting: *Deleted

## 2020-08-29 ENCOUNTER — Observation Stay: Admit: 2020-08-29 | Payer: Medicare Other

## 2020-08-29 DIAGNOSIS — W19XXXA Unspecified fall, initial encounter: Secondary | ICD-10-CM | POA: Diagnosis not present

## 2020-08-29 DIAGNOSIS — Z634 Disappearance and death of family member: Secondary | ICD-10-CM

## 2020-08-29 DIAGNOSIS — E7800 Pure hypercholesterolemia, unspecified: Secondary | ICD-10-CM | POA: Diagnosis present

## 2020-08-29 DIAGNOSIS — G4700 Insomnia, unspecified: Secondary | ICD-10-CM | POA: Diagnosis present

## 2020-08-29 DIAGNOSIS — E039 Hypothyroidism, unspecified: Secondary | ICD-10-CM | POA: Diagnosis present

## 2020-08-29 DIAGNOSIS — Z880 Allergy status to penicillin: Secondary | ICD-10-CM

## 2020-08-29 DIAGNOSIS — F332 Major depressive disorder, recurrent severe without psychotic features: Secondary | ICD-10-CM | POA: Diagnosis present

## 2020-08-29 DIAGNOSIS — N1832 Chronic kidney disease, stage 3b: Secondary | ICD-10-CM | POA: Diagnosis present

## 2020-08-29 DIAGNOSIS — I10 Essential (primary) hypertension: Secondary | ICD-10-CM | POA: Diagnosis present

## 2020-08-29 DIAGNOSIS — I129 Hypertensive chronic kidney disease with stage 1 through stage 4 chronic kidney disease, or unspecified chronic kidney disease: Secondary | ICD-10-CM | POA: Diagnosis present

## 2020-08-29 DIAGNOSIS — E782 Mixed hyperlipidemia: Secondary | ICD-10-CM | POA: Diagnosis present

## 2020-08-29 DIAGNOSIS — M1711 Unilateral primary osteoarthritis, right knee: Secondary | ICD-10-CM | POA: Diagnosis present

## 2020-08-29 DIAGNOSIS — F419 Anxiety disorder, unspecified: Secondary | ICD-10-CM

## 2020-08-29 DIAGNOSIS — R296 Repeated falls: Secondary | ICD-10-CM

## 2020-08-29 DIAGNOSIS — F411 Generalized anxiety disorder: Secondary | ICD-10-CM | POA: Diagnosis present

## 2020-08-29 DIAGNOSIS — Z818 Family history of other mental and behavioral disorders: Secondary | ICD-10-CM

## 2020-08-29 DIAGNOSIS — R531 Weakness: Secondary | ICD-10-CM | POA: Diagnosis not present

## 2020-08-29 DIAGNOSIS — R001 Bradycardia, unspecified: Secondary | ICD-10-CM | POA: Diagnosis not present

## 2020-08-29 DIAGNOSIS — Z825 Family history of asthma and other chronic lower respiratory diseases: Secondary | ICD-10-CM

## 2020-08-29 DIAGNOSIS — D631 Anemia in chronic kidney disease: Secondary | ICD-10-CM | POA: Diagnosis present

## 2020-08-29 DIAGNOSIS — M10071 Idiopathic gout, right ankle and foot: Secondary | ICD-10-CM | POA: Diagnosis present

## 2020-08-29 DIAGNOSIS — R609 Edema, unspecified: Secondary | ICD-10-CM | POA: Diagnosis not present

## 2020-08-29 DIAGNOSIS — E538 Deficiency of other specified B group vitamins: Secondary | ICD-10-CM | POA: Diagnosis present

## 2020-08-29 DIAGNOSIS — M79643 Pain in unspecified hand: Secondary | ICD-10-CM

## 2020-08-29 DIAGNOSIS — Z803 Family history of malignant neoplasm of breast: Secondary | ICD-10-CM

## 2020-08-29 DIAGNOSIS — E860 Dehydration: Secondary | ICD-10-CM | POA: Diagnosis not present

## 2020-08-29 DIAGNOSIS — R29898 Other symptoms and signs involving the musculoskeletal system: Secondary | ICD-10-CM | POA: Diagnosis not present

## 2020-08-29 DIAGNOSIS — Z79899 Other long term (current) drug therapy: Secondary | ICD-10-CM

## 2020-08-29 DIAGNOSIS — S0990XA Unspecified injury of head, initial encounter: Secondary | ICD-10-CM | POA: Diagnosis present

## 2020-08-29 DIAGNOSIS — F4321 Adjustment disorder with depressed mood: Secondary | ICD-10-CM | POA: Diagnosis present

## 2020-08-29 DIAGNOSIS — Z7982 Long term (current) use of aspirin: Secondary | ICD-10-CM

## 2020-08-29 DIAGNOSIS — M7989 Other specified soft tissue disorders: Secondary | ICD-10-CM | POA: Diagnosis not present

## 2020-08-29 DIAGNOSIS — E871 Hypo-osmolality and hyponatremia: Secondary | ICD-10-CM | POA: Diagnosis not present

## 2020-08-29 DIAGNOSIS — Z043 Encounter for examination and observation following other accident: Secondary | ICD-10-CM | POA: Diagnosis not present

## 2020-08-29 DIAGNOSIS — Z888 Allergy status to other drugs, medicaments and biological substances status: Secondary | ICD-10-CM

## 2020-08-29 DIAGNOSIS — Z800 Family history of malignant neoplasm of digestive organs: Secondary | ICD-10-CM

## 2020-08-29 DIAGNOSIS — R413 Other amnesia: Secondary | ICD-10-CM | POA: Diagnosis present

## 2020-08-29 DIAGNOSIS — N1831 Chronic kidney disease, stage 3a: Secondary | ICD-10-CM | POA: Diagnosis present

## 2020-08-29 DIAGNOSIS — F061 Catatonic disorder due to known physiological condition: Secondary | ICD-10-CM | POA: Diagnosis present

## 2020-08-29 DIAGNOSIS — J45909 Unspecified asthma, uncomplicated: Secondary | ICD-10-CM | POA: Diagnosis present

## 2020-08-29 DIAGNOSIS — Z85841 Personal history of malignant neoplasm of brain: Secondary | ICD-10-CM

## 2020-08-29 DIAGNOSIS — Z8249 Family history of ischemic heart disease and other diseases of the circulatory system: Secondary | ICD-10-CM

## 2020-08-29 DIAGNOSIS — N183 Chronic kidney disease, stage 3 unspecified: Secondary | ICD-10-CM | POA: Diagnosis present

## 2020-08-29 DIAGNOSIS — S62392A Other fracture of third metacarpal bone, right hand, initial encounter for closed fracture: Secondary | ICD-10-CM | POA: Diagnosis present

## 2020-08-29 DIAGNOSIS — Z79890 Hormone replacement therapy (postmenopausal): Secondary | ICD-10-CM

## 2020-08-29 DIAGNOSIS — F4381 Prolonged grief disorder: Secondary | ICD-10-CM

## 2020-08-29 DIAGNOSIS — Z20822 Contact with and (suspected) exposure to covid-19: Secondary | ICD-10-CM | POA: Diagnosis present

## 2020-08-29 HISTORY — DX: Depression, unspecified: F32.A

## 2020-08-29 LAB — URINALYSIS, COMPLETE (UACMP) WITH MICROSCOPIC
Bacteria, UA: NONE SEEN
Bilirubin Urine: NEGATIVE
Glucose, UA: NEGATIVE mg/dL
Hgb urine dipstick: NEGATIVE
Ketones, ur: NEGATIVE mg/dL
Leukocytes,Ua: NEGATIVE
Nitrite: NEGATIVE
Protein, ur: NEGATIVE mg/dL
Specific Gravity, Urine: 1.005 (ref 1.005–1.030)
pH: 7 (ref 5.0–8.0)

## 2020-08-29 LAB — CBC WITH DIFFERENTIAL/PLATELET
Abs Immature Granulocytes: 0.03 10*3/uL (ref 0.00–0.07)
Basophils Absolute: 0 10*3/uL (ref 0.0–0.1)
Basophils Relative: 0 %
Eosinophils Absolute: 0.1 10*3/uL (ref 0.0–0.5)
Eosinophils Relative: 2 %
HCT: 28.8 % — ABNORMAL LOW (ref 36.0–46.0)
Hemoglobin: 9.7 g/dL — ABNORMAL LOW (ref 12.0–15.0)
Immature Granulocytes: 0 %
Lymphocytes Relative: 28 %
Lymphs Abs: 2.1 10*3/uL (ref 0.7–4.0)
MCH: 33.2 pg (ref 26.0–34.0)
MCHC: 33.7 g/dL (ref 30.0–36.0)
MCV: 98.6 fL (ref 80.0–100.0)
Monocytes Absolute: 0.9 10*3/uL (ref 0.1–1.0)
Monocytes Relative: 11 %
Neutro Abs: 4.5 10*3/uL (ref 1.7–7.7)
Neutrophils Relative %: 59 %
Platelets: 210 10*3/uL (ref 150–400)
RBC: 2.92 MIL/uL — ABNORMAL LOW (ref 3.87–5.11)
RDW: 12.6 % (ref 11.5–15.5)
WBC: 7.6 10*3/uL (ref 4.0–10.5)
nRBC: 0 % (ref 0.0–0.2)

## 2020-08-29 LAB — COMPREHENSIVE METABOLIC PANEL
ALT: 17 U/L (ref 0–44)
AST: 23 U/L (ref 15–41)
Albumin: 4 g/dL (ref 3.5–5.0)
Alkaline Phosphatase: 90 U/L (ref 38–126)
Anion gap: 6 (ref 5–15)
BUN: 17 mg/dL (ref 8–23)
CO2: 25 mmol/L (ref 22–32)
Calcium: 8.9 mg/dL (ref 8.9–10.3)
Chloride: 100 mmol/L (ref 98–111)
Creatinine, Ser: 1.2 mg/dL — ABNORMAL HIGH (ref 0.44–1.00)
GFR, Estimated: 48 mL/min — ABNORMAL LOW (ref >60)
Glucose, Bld: 102 mg/dL — ABNORMAL HIGH (ref 70–99)
Potassium: 4.3 mmol/L (ref 3.5–5.1)
Sodium: 131 mmol/L — ABNORMAL LOW (ref 135–145)
Total Bilirubin: 1 mg/dL (ref 0.3–1.2)
Total Protein: 7.7 g/dL (ref 6.5–8.1)

## 2020-08-29 LAB — TSH: TSH: 45.707 u[IU]/mL — ABNORMAL HIGH (ref 0.350–4.500)

## 2020-08-29 LAB — TROPONIN I (HIGH SENSITIVITY)
Troponin I (High Sensitivity): 4 ng/L (ref <18)
Troponin I (High Sensitivity): 5 ng/L (ref <18)

## 2020-08-29 MED ORDER — HYDROXYZINE PAMOATE 25 MG PO CAPS
25.0000 mg | ORAL_CAPSULE | Freq: Every day | ORAL | Status: DC | PRN
  Filled 2020-08-29: qty 1

## 2020-08-29 MED ORDER — ENOXAPARIN SODIUM 40 MG/0.4ML IJ SOSY
40.0000 mg | PREFILLED_SYRINGE | INTRAMUSCULAR | Status: DC
  Administered 2020-08-29 – 2020-09-03 (×6): 40 mg via SUBCUTANEOUS
  Filled 2020-08-29 (×6): qty 0.4

## 2020-08-29 MED ORDER — ACETAMINOPHEN 650 MG RE SUPP: 650.0000 mg | RECTAL | Status: DC | PRN

## 2020-08-29 MED ORDER — HYDRALAZINE HCL 50 MG PO TABS: 25.0000 mg | ORAL_TABLET | Freq: Three times a day (TID) | ORAL | Status: AC | PRN

## 2020-08-29 MED ORDER — LABETALOL HCL 200 MG PO TABS
200.0000 mg | ORAL_TABLET | Freq: Two times a day (BID) | ORAL | Status: DC
  Administered 2020-08-29 – 2020-09-04 (×12): 200 mg via ORAL
  Filled 2020-08-29 (×13): qty 1

## 2020-08-29 MED ORDER — ACETAMINOPHEN 160 MG/5ML PO SOLN
650.0000 mg | ORAL | Status: DC | PRN
  Filled 2020-08-29: qty 20.3

## 2020-08-29 MED ORDER — PRAVASTATIN SODIUM 20 MG PO TABS
20.0000 mg | ORAL_TABLET | Freq: Every day | ORAL | Status: DC
  Administered 2020-08-29 – 2020-09-03 (×6): 20 mg via ORAL
  Filled 2020-08-29 (×7): qty 1

## 2020-08-29 MED ORDER — QUETIAPINE FUMARATE 25 MG PO TABS
100.0000 mg | ORAL_TABLET | Freq: Every day | ORAL | Status: DC
  Administered 2020-08-29: 21:00:00 25 mg via ORAL
  Administered 2020-08-30 – 2020-09-03 (×5): 100 mg via ORAL
  Filled 2020-08-29 (×6): qty 4

## 2020-08-29 MED ORDER — ACETAMINOPHEN 325 MG PO TABS: 650.0000 mg | ORAL_TABLET | ORAL | Status: DC | PRN

## 2020-08-29 MED ORDER — NORTRIPTYLINE HCL 25 MG PO CAPS
100.0000 mg | ORAL_CAPSULE | Freq: Every day | ORAL | Status: DC
  Administered 2020-08-29: 21:00:00 100 mg via ORAL
  Filled 2020-08-29 (×2): qty 4

## 2020-08-29 MED ORDER — ALBUTEROL SULFATE HFA 108 (90 BASE) MCG/ACT IN AERS: 2.0000 | INHALATION_SPRAY | Freq: Four times a day (QID) | RESPIRATORY_TRACT | Status: DC | PRN

## 2020-08-29 MED ORDER — LEVOTHYROXINE SODIUM 112 MCG PO TABS
112.0000 ug | ORAL_TABLET | Freq: Every day | ORAL | Status: DC
  Administered 2020-08-30: 05:00:00 112 ug via ORAL
  Filled 2020-08-29: qty 1

## 2020-08-29 MED ORDER — ALPRAZOLAM 0.25 MG PO TABS
0.2500 mg | ORAL_TABLET | Freq: Every day | ORAL | Status: DC | PRN
  Administered 2020-08-30: 21:00:00 0.25 mg via ORAL
  Filled 2020-08-29 (×2): qty 1

## 2020-08-29 MED ORDER — STROKE: EARLY STAGES OF RECOVERY BOOK: Freq: Once | Status: DC

## 2020-08-29 MED ORDER — ASPIRIN 81 MG PO CHEW
81.0000 mg | CHEWABLE_TABLET | Freq: Every day | ORAL | Status: DC
  Administered 2020-08-30 – 2020-09-04 (×6): 81 mg via ORAL
  Filled 2020-08-29 (×6): qty 1

## 2020-08-29 MED ORDER — CITALOPRAM HYDROBROMIDE 20 MG PO TABS
10.0000 mg | ORAL_TABLET | Freq: Every day | ORAL | Status: DC
  Administered 2020-08-30: 10 mg via ORAL
  Filled 2020-08-29: qty 1

## 2020-08-29 NOTE — ED Notes (Signed)
Patient has multiple contusions in various stages of healing that patient reports is due to multiple falls recently. Large contusion noted along posterior aspect of the left forearm. Patient is also noted to have fading contusion to the left forehead.  Patient is A/O X 4 with clear speech. Reports BLE weakness that started this morning, following a fall yesterday. Patient states she was able to ambulate after discharge from ED yesterday. Denies dizziness.

## 2020-08-29 NOTE — Telephone Encounter (Signed)
Yes, they should take her back to ER

## 2020-08-29 NOTE — ED Notes (Signed)
Producer, television/film/video setting up transport now.

## 2020-08-29 NOTE — H&P (Signed)
History and Physical   Wanda Ochoa TGY:[id number] DOB: 1946/03/17 DOA: 08/29/2020  PCP: Virginia Crews, MD  Outpatient Specialists: Dr. Weber Cooks (pyschiatry), Dr. Nehemiah Massed [facility] cardiology),  Patient coming from: Home via EMS  I have personally briefly reviewed patient's old medical records in [place].  Chief Concern: Inability to ambulate  HPI: Wanda Ochoa is a 75 y.o. female with medical history significant for history of catatonic depression in 2014 after daugther improved from brain cancer, depression, anxiety, history of complicated grieving process, presents to the emergency department for chief concerns of bilateral lower extremity weakness.  Per son at bedside, patient has had weakness and collapse approximately 18 times after her husband passed away in 2022-06-27.  During these episodes son denies syncope and loss of consciousness.  She reports that her husband passed away in June 28, 2022 from heart problems. Since then, per son at bedside, patient has fallen with increased in frequency.  Son also noticed that patient has had moments of memory loss of very recent events.  For example, she was visiting her son, Wanda Ochoa, and his family in [place] for the weekend a few weeks ago.  On the drive back to her home, 30 minutes after leaving Wanda Ochoa's home, she asked his house where we asked?.  At bedside now she reports that she does remember that we can however she does not remember being confused about it.  Prior to presentation today, she was at home with another son, when she suddenly felt like she couldn't walk. She states her son tried to help her and she was not able to get herself up. EMS had to   At bedside, she is awake alert and oriented x4, specifically she was able to tell me her age, name, current location, year, and president of the [place].  Social history: lives at home with a son. She denies tobacco use, etoh use, recreational drug use.  She is  retired and currently volunteers at [employer] during some of her free time.  Vaccination: she is vaccinated for covid 19, 3 doses  She endorses intentional weight loss over the last 6-7 months of approximately 20 pounds  ROS: Constitutional: + weight change, no fever ENT/Mouth: no sore throat, no rhinorrhea Eyes: no eye pain, no vision changes Cardiovascular: no chest pain, no dyspnea,  no edema, no palpitations Respiratory: no cough, no sputum, no wheezing Gastrointestinal: no nausea, no vomiting, no diarrhea, no constipation Genitourinary: no urinary incontinence, no dysuria, no hematuria Musculoskeletal: no arthralgias, no myalgias Skin: no skin lesions, no pruritus, Neuro: + weakness, no loss of consciousness, no syncope Psych: no anxiety, no depression, no decrease appetite Heme/Lymph: no bruising, no bleeding  ED Course: Discussed with ED provider, patient requiring hospitalization due to inability to bear weight.  Vitals in the emergency department was relatively unremarkable with temperature of 98.5, respiration rate of 15, heart rate 61, blood pressure 160/73, SPO2 of 97% on room air.  Labs in the emergency department was remarkable for sodium of 131, potassium 4.3, chloride 100, bicarb 25, BUN 17, serum creatinine of 1.20, nonfasting blood glucose of 102.  eGFR was 48.  WBC 7.6, hemoglobin 9.7, platelets 210.  EDP ordered CT of the head without contrast which was read as atrophy with small vessel chronic ischemic changes of deep cerebral white matter.  Assessment/Plan  Principal Problem:   Weakness Active Problems:   Generalized anxiety disorder   Hypothyroidism   Benign hypertension   Hypercholesteremia   Severe recurrent major  depression without psychotic features ([place])   Primary osteoarthritis of right knee   Idiopathic gout involving toe of right foot   Stage 3a chronic kidney disease ([place])   Complicated grieving   Weakness and inability to bear weight Stroke  versus TIA cannot be excluded versus complicated grieving process - CT head without contrast read as: Negative for hemorrhagic stroke and positive for atrophy with small vessel chronic ischemic changes of deep cerebral white matter - Neurology has not been consulted pending MRI - MRI of the brain  - checking the following labs: B12, B1, TSH, A1c, lipid panel - Frequent neuro vascular checks - heart healthy - PT, OT - Fall precaution  -If the above work-up is unremarkable, would recommend a.m. team to consider consultation to psychiatry -Patient has a neurology appointment for a.m. of 08/30/2020 - Initial high sensitive troponin was negative at a 4, second troponin is ordered -Given no signs of infectious etiology, and patient did not have any urinary symptoms, and UA was negative for nitrates and leukocytes, I do not suspect urinary tract infection at this time  Hypertension-resumed home labetalol 200 mg twice daily -Hydralazine 25 mg as needed every 8 hours for SBP greater than 180  History of major depressive disorder-resumed home citalopram 10 mg daily, nortriptyline 100 mg nightly  Anxiety-generalized, resumed home alprazolam 0.25 mg p.o. daily as needed for anxiety, hydroxyzine 25 mg p.o. as needed daily for anxiety  History of catatonic depression-follows with psychiatry outpatient  Insomnia-resumed quetiapine 100 mg nightly  Acquired hypothyroid-resumed levothyroxine 112 mcg p.o. daily in the a.m. before breakfast  Hyperlipidemia-resumed pravastatin 20 mg nightly  CKD 3B-at baseline  COVID PCR is pending result  Chart reviewed.   DVT prophylaxis: Enoxaparin 40 mg subcutaneous, every 24 hours Code Status: Full code Diet: Heart healthy Family Communication: Rosann Auerbach, son  Disposition Plan: Pending clinical course Consults called: None at this time, recommend a.m. team to consider consultation psychiatry Admission status: MedSurg, observation, with telemetry  ordered  Past Medical History:  Diagnosis Date  . Anxiety   . Arthritis    right knee  . Asthma   . Depression    catatonic depression per pt report  . HTN (hypertension)   . Thyroid disease    Past Surgical History:  Procedure Laterality Date  . ABDOMINAL HYSTERECTOMY  1990  . BLADDER SUSPENSION  1990  . BREAST CYST ASPIRATION Right 1985  . BREAST EXCISIONAL BIOPSY Right 1967   EXCISIONAL BX - NEG  . BREAST SURGERY Right 1967   Lumpectomy  . CESAREAN SECTION    . COLONOSCOPY WITH PROPOFOL N/A 12/18/2018   Procedure: COLONOSCOPY WITH PROPOFOL;  Surgeon: Lollie Sails, MD;  Location: [facility] ENDOSCOPY;  Service: Endoscopy;  Laterality: N/A;  . HEMORRHOIDECTOMY WITH HEMORRHOID BANDING  1988   Social History:  reports that she has never smoked. She has never used smokeless tobacco. She reports that she does not drink alcohol and does not use drugs.  Allergies  Allergen Reactions  . Penicillins Anaphylaxis  . Lipitor [Atorvastatin] Rash    Severe rash on skin after taking approximately 2 weeks.    Family History  Problem Relation Age of Onset  . Depression Father   . Suicidality Father   . Depression Sister   . Breast cancer Sister 59  . Autism Son   . Heart disease Mother   . Colon cancer Mother   . Hypertension Mother   . Anxiety disorder Mother   . Mental illness Daughter   .  Allergies Brother   . Allergies Sister   . Asthma Maternal Uncle   . Heart disease Maternal Grandfather   . Heart disease Maternal Grandmother   . Breast cancer Paternal Grandmother   . Breast cancer Other    Family history: Family history reviewed and not pertinent  Prior to Admission medications   Medication Sig Start Date End Date Taking? Authorizing Provider  albuterol (VENTOLIN HFA) 108 (90 Base) MCG/ACT inhaler INHALE 2 PUFFS INTO THE LUNGS EVERY 6 HOURS AS NEEDED 08/10/19   Bacigalupo, Dionne Bucy, MD  amLODipine (NORVASC) 5 MG tablet Take 5 mg by mouth 2 (two) times daily.      [provider]  aspirin 81 MG tablet Take 81 mg by mouth daily.    [provider]  citalopram (CELEXA) 10 MG tablet Take 10 mg by mouth daily. 08/08/19   [provider]  hydrochlorothiazide (HYDRODIURIL) 12.5 MG tablet TAKE 1 TABLET(12.5 MG) BY MOUTH DAILY 06/09/20   Virginia Crews, MD  hydrOXYzine (ATARAX/VISTARIL) 25 MG tablet Take 25 mg by mouth 4 (four) times daily as needed.    [provider]  KRILL OIL PO Take 1 capsule by mouth daily. Reported on 06/02/2015    [provider]  levothyroxine (SYNTHROID) 112 MCG tablet Take 1 tablet (112 mcg total) by mouth daily. 08/03/20   Virginia Crews, MD  lisinopril (PRINIVIL,ZESTRIL) 20 MG tablet Take 20 mg by mouth 2 (two) times daily.     [provider]  metoprolol tartrate (LOPRESSOR) 50 MG tablet Take 1 tablet (50 mg total) by mouth 2 (two) times daily. Patient taking differently: Take 25 mg by mouth 2 (two) times daily. 06/01/19   Virginia Crews, MD  Multiple Vitamin (MULTIVITAMIN) capsule Take 1 capsule by mouth daily.    [provider]  nortriptyline (PAMELOR) 25 MG capsule Take 4 capsule at bed time Patient taking differently: Take 3 capsule at bed time 04/08/12   Arfeen, Arlyce Harman, MD  Omega 3-6-9 CAPS Take by mouth daily.     [provider]  polyethylene glycol (MIRALAX / GLYCOLAX) 17 g packet Take 17 g by mouth daily. Mix in 4-8 oz of fluid    [provider]  QUEtiapine (SEROQUEL) 100 MG tablet Take 200 mg by mouth at bedtime.     [provider]   Physical Exam: Vitals:   08/29/20 1530 08/29/20 1600 08/29/20 1710 08/29/20 1855  BP:   (!) 174/79 (!) 189/86  Pulse: 61 61 70   Resp: 19 19 18 14   Temp:    98.1 F (36.7 C)  TempSrc:      SpO2: 98% 98% 98% 100%  Weight:      Height:       Constitutional: appears age-appropriate, NAD, calm, comfortable Eyes: PERRL, lids and conjunctivae normal ENMT: Mucous membranes are moist.  Posterior pharynx clear of any exudate or lesions. Age-appropriate dentition. Hearing appropriate Neck: normal, supple, no masses, no thyromegaly Respiratory: clear to auscultation bilaterally, no wheezing, no crackles. Normal respiratory effort. No accessory muscle use.  Cardiovascular: Regular rate and rhythm, no murmurs / rubs / gallops. No extremity edema. 2+ pedal pulses. No carotid bruits.  Abdomen: no tenderness, no masses palpated, no hepatosplenomegaly. Bowel sounds positive.  Musculoskeletal: no clubbing / cyanosis. No joint deformity upper and lower extremities. Good ROM, no contractures, no atrophy. Normal muscle tone.  Skin: no rashes, lesions, ulcers. No induration Neurologic: Sensation intact. Strength 5/5 in all 4.  Psychiatric: Normal  judgment and insight. Alert and oriented x 3. Normal mood.   EKG: independently reviewed, showing sinus rhythm with rate of 63, QTc 478  Chest x-ray on Admission: I personally reviewed and I agree with radiologist reading as below.  DG Chest 2 View  Result Date: 08/29/2020 CLINICAL DATA:  Weakness. EXAM: CHEST - 2 VIEW COMPARISON:  May 19, 2015. FINDINGS: The heart size and mediastinal contours are within normal limits. Both lungs are clear. The visualized skeletal structures are unremarkable. IMPRESSION: No active cardiopulmonary disease. Electronically Signed   By: Marijo Conception M.D.   On: 08/29/2020 14:54   CT Head Wo Contrast  Result Date: 08/29/2020 CLINICAL DATA:  Lower extremity weakness question stroke, knees collapsed, fall, falling a lot lately, history hypertension EXAM: CT HEAD WITHOUT CONTRAST TECHNIQUE: Contiguous axial images were obtained from the base of the skull through the vertex without intravenous contrast. Sagittal and coronal MPR images reconstructed from axial data set. COMPARISON:  08/28/2020 FINDINGS: Brain: Generalized atrophy. Normal ventricular morphology. No midline shift or mass effect. Small vessel chronic  ischemic changes of deep cerebral white matter. No intracranial hemorrhage, mass lesion, evidence of acute infarction, or extra-axial fluid collection. Vascular: Mild atherosclerotic calcification of internal carotid arteries at skull base. Skull: Intact Sinuses/Orbits: Clear Other: N/A IMPRESSION: Atrophy with small vessel chronic ischemic changes of deep cerebral white matter. No acute intracranial abnormalities. Electronically Signed   By: Lavonia Dana M.D.   On: 08/29/2020 15:05   CT Head Wo Contrast  Result Date: 08/28/2020 CLINICAL DATA:  Fall. EXAM: CT HEAD WITHOUT CONTRAST CT CERVICAL SPINE WITHOUT CONTRAST TECHNIQUE: Multidetector CT imaging of the head and cervical spine was performed following the standard protocol without intravenous contrast. Multiplanar CT image reconstructions of the cervical spine were also generated. COMPARISON:  Head CT 08/21/2020.  Cervical spine CT 06/07/2019 FINDINGS: CT HEAD FINDINGS Brain: There is no evidence for acute hemorrhage, hydrocephalus, mass lesion, or abnormal extra-axial fluid collection. No definite CT evidence for acute infarction. Mild prominence of the lateral ventricles is stable, likely related to central atrophy Patchy low attenuation in the deep hemispheric and periventricular white matter is nonspecific, but likely reflects chronic microvascular ischemic demyelination. Vascular: No hyperdense vessel or unexpected calcification. Skull: No evidence for fracture. No worrisome lytic or sclerotic lesion. Sinuses/Orbits: The visualized paranasal sinuses and mastoid air cells are clear. Visualized portions of the globes and intraorbital fat are unremarkable. Other: None. CT CERVICAL SPINE FINDINGS Alignment: Normal. Skull base and vertebrae: No acute fracture. No primary bone lesion or focal pathologic process. Soft tissues and spinal canal: No prevertebral fluid or swelling. No visible canal hematoma. Disc levels: Loss of disc height with endplate degeneration  noted C5-6. Upper chest: Pleuroparenchymal scarring noted in the lung apices. Other: None. IMPRESSION: 1. No acute intracranial abnormality. Atrophy with chronic small vessel white matter ischemic disease. 2. Degenerative changes in the cervical spine without fracture. Electronically Signed   By: Misty Stanley M.D.   On: 08/28/2020 13:27   CT Cervical Spine Wo Contrast  Result Date: 08/28/2020 CLINICAL DATA:  Fall. EXAM: CT HEAD WITHOUT CONTRAST CT CERVICAL SPINE WITHOUT CONTRAST TECHNIQUE: Multidetector CT imaging of the head and cervical spine was performed following the standard protocol without intravenous contrast. Multiplanar CT image reconstructions of the cervical spine were also generated. COMPARISON:  Head CT 08/21/2020.  Cervical spine CT 06/07/2019 FINDINGS: CT HEAD FINDINGS Brain: There is no evidence for acute hemorrhage, hydrocephalus, mass lesion, or abnormal extra-axial fluid collection. No definite  CT evidence for acute infarction. Mild prominence of the lateral ventricles is stable, likely related to central atrophy Patchy low attenuation in the deep hemispheric and periventricular white matter is nonspecific, but likely reflects chronic microvascular ischemic demyelination. Vascular: No hyperdense vessel or unexpected calcification. Skull: No evidence for fracture. No worrisome lytic or sclerotic lesion. Sinuses/Orbits: The visualized paranasal sinuses and mastoid air cells are clear. Visualized portions of the globes and intraorbital fat are unremarkable. Other: None. CT CERVICAL SPINE FINDINGS Alignment: Normal. Skull base and vertebrae: No acute fracture. No primary bone lesion or focal pathologic process. Soft tissues and spinal canal: No prevertebral fluid or swelling. No visible canal hematoma. Disc levels: Loss of disc height with endplate degeneration noted C5-6. Upper chest: Pleuroparenchymal scarring noted in the lung apices. Other: None. IMPRESSION: 1. No acute intracranial  abnormality. Atrophy with chronic small vessel white matter ischemic disease. 2. Degenerative changes in the cervical spine without fracture. Electronically Signed   By: Misty Stanley M.D.   On: 08/28/2020 13:27   MR BRAIN WO CONTRAST  Result Date: 08/29/2020 CLINICAL DATA:  Lower extremity weakness EXAM: MRI HEAD WITHOUT CONTRAST TECHNIQUE: Multiplanar, multiecho pulse sequences of the brain and surrounding structures were obtained without intravenous contrast. COMPARISON:  Correlation made with recent CT imaging. Prior MRI is from 20/10 FINDINGS: Brain: There is no acute infarction or intracranial hemorrhage. There is no intracranial mass, mass effect, or edema. There is no hydrocephalus or extra-axial fluid collection. Prominence of the ventricles and sulci reflects generalized parenchymal volume loss. Patchy T2 hyperintensity in the supratentorial white matter is nonspecific but may reflect mild chronic microvascular ischemic changes. Focus of susceptibility hypointensity in the right subinsular white matter likely reflects chronic microhemorrhage. Vascular: Major vessel flow voids at the skull base are preserved. Skull and upper cervical spine: Normal marrow signal is preserved. Sinuses/Orbits: Trace paranasal sinus mucosal thickening. Orbits are unremarkable. Other: Sella is unremarkable.  Mastoid air cells are clear. IMPRESSION: No acute infarction, hemorrhage, or mass. Mild chronic microvascular ischemic changes. Electronically Signed   By: Macy Mis M.D.   On: 08/29/2020 18:45   DG Hand 2 View Right  Result Date: 08/29/2020 CLINICAL DATA:  Status post fall. EXAM: RIGHT HAND - 2 VIEW COMPARISON:  None FINDINGS: There is marked dorsal soft tissue swelling. Cortical irregularity is identified along the posterior head of the second metacarpal bone concerning for mild impaction fracture. No dislocation. IMPRESSION: 1. Suspect mild impaction fracture involving the posterior head of the second  metacarpal bone. Correlation with exact site of tenderness advised. 2. Marked dorsal soft tissue swelling. Electronically Signed   By: Kerby Moors M.D.   On: 08/29/2020 14:57   Labs on Admission: I have personally reviewed following labs  CBC: Recent Labs  Lab 08/28/20 1302 08/29/20 1454  WBC 6.2 7.6  NEUTROABS 3.5 4.5  HGB 9.2* 9.7*  HCT 27.8* 28.8*  MCV 98.2 98.6  PLT 207 854   Basic Metabolic Panel: Recent Labs  Lab 08/28/20 1302 08/29/20 1454  NA 134* 131*  K 5.1 4.3  CL 100 100  CO2 26 25  GLUCOSE 97 102*  BUN 24* 17  CREATININE 1.73* 1.20*  CALCIUM 8.9 8.9   GFR: Estimated Creatinine Clearance: 43 mL/min (A) (by C-G formula based on SCr of 1.2 mg/dL (H)).  Liver Function Tests: Recent Labs  Lab 08/29/20 1454  AST 23  ALT 17  ALKPHOS 90  BILITOT 1.0  PROT 7.7  ALBUMIN 4.0   Urine analysis:  Component Value Date/Time   COLORURINE STRAW (A) 08/29/2020 1920   APPEARANCEUR CLEAR (A) 08/29/2020 1920   APPEARANCEUR Hazy 09/14/2012 0749   LABSPEC 1.005 08/29/2020 1920   LABSPEC 1.010 09/14/2012 0749   PHURINE 7.0 08/29/2020 1920   GLUCOSEU NEGATIVE 08/29/2020 1920   GLUCOSEU Negative 09/14/2012 0749   HGBUR NEGATIVE 08/29/2020 1920   BILIRUBINUR NEGATIVE 08/29/2020 1920   BILIRUBINUR Neg 09/11/2016 1445   BILIRUBINUR Negative 09/14/2012 0749   KETONESUR NEGATIVE 08/29/2020 1920   PROTEINUR NEGATIVE 08/29/2020 1920   UROBILINOGEN 0.2 09/11/2016 1445   NITRITE NEGATIVE 08/29/2020 1920   LEUKOCYTESUR NEGATIVE 08/29/2020 1920   LEUKOCYTESUR 2+ 09/14/2012 0749   Elveria Lauderbaugh N Shakirra Buehler D.O. [facility]  If 7PM-7AM, please contact overnight-coverage provider If 7AM-7PM, please contact day coverage provider [email or web address]  08/29/2020, 8:13 PM

## 2020-08-29 NOTE — Telephone Encounter (Addendum)
11:09 am. Called patient back after phone failure issue. No answer, left voicemail for patient to call back to advise.   Pt's son said the patient fell and they took her to the ER at [facility] and they have released her, but today she is not able to stand up and her hand is swollen. He wants to know if he should take her back to the ER or what should he do. Called patient to review recent fall and symptoms noted today by patient's son. Patient reports she fell volunteering yesterday and her knees and legs "just gave way". C/o right hand swollen and discolored palm. Unable to hold a drink with right hand. Patient requires assistance when standing or walking today. Patient reports she has help and is scheduled to see a neurologist due to approx. 13 falls so far. Call dropped and NT unable to call back at this time. Will attempt again. Son with patient at this time. Did not complete call to review if patient needs to go back to ED. Please advise.   Reason for Disposition . [1] MODERATE weakness (i.e., interferes with work, school, normal activities) AND [2] new-onset or worsening  Answer Assessment - Initial Assessment Questions 1. MECHANISM: "How did the fall happen?"     Volunteering and was walking and legs and knees gave way 2. DOMESTIC VIOLENCE AND ELDER ABUSE SCREENING: "Did you fall because someone pushed you or tried to hurt you?" If Yes, ask: "Are you safe now?"     denies 3. ONSET: "When did the fall happen?" (e.g., minutes, hours, or days ago)     Yesterday 08/28/20 4. LOCATION: "What part of the body hit the ground?" (e.g., back, buttocks, head, hips, knees, hands, head, stomach)     Buttocks, right hand  5. INJURY: "Did you hurt (injure) yourself when you fell?" If Yes, ask: "What did you injure? Tell me more about this?" (e.g., body area; type of injury; pain severity)"     Yes. Right hand swollen  6. PAIN: "Is there any pain?" If Yes, ask: "How bad is the pain?" (e.g., Scale 1-10; or mild,   moderate, severe)   - NONE (0): No pain   - MILD (1-3): Doesn't interfere with normal activities    - MODERATE (4-7): Interferes with normal activities or awakens from sleep    - SEVERE (8-10): Excruciating pain, unable to do any normal activities      na 7. SIZE: For cuts, bruises, or swelling, ask: "How large is it?" (e.g., inches or centimeters)      na 8. PREGNANCY: "Is there any chance you are pregnant?" "When was your last menstrual period?"     na 9. OTHER SYMPTOMS: "Do you have any other symptoms?" (e.g., dizziness, fever, weakness; new onset or worsening).      Lightheaded at times prior to fall, weakness 10. CAUSE: "What do you think caused the fall (or falling)?" (e.g., tripped, dizzy spell)       Knees and legs weak, lightheaded  Protocols used: FALLS AND FALLING-A-AH

## 2020-08-29 NOTE — ED Provider Notes (Signed)
[facility] Emergency Department Provider Note   ____________________________________________   Event Date/Time   First MD Initiated Contact with Patient 08/29/20 1348     (approximate)  I have reviewed the triage vital signs and the nursing notes.   HISTORY  Chief Complaint Weakness (Seen yesterday in this ED for fall, discharged home and cannot walk or bear weight today.)    HPI Wanda Ochoa is a 75 y.o. female with past medical history of hypertension, hyperlipidemia, CKD, and asthma who presents to the ED for weakness.  Patient volunteers at the hospital and was seen in the ED yesterday after her legs gave out on her and she fell, striking her head.  Imaging was negative for traumatic injury at that time, labs remarkable for mild drop in her hemoglobin as well as mild AKI.  Patient was discharged home with plan for neurology follow-up, although weakness seems to have progressed since he returned home.  She states that she has been unable to get herself up out of a chair since waking up this morning, required significant assistance from her son.  She has been unable to walk on her own, which is a significant change for her.  She denies any fevers, cough, chest pain, shortness of breath, dysuria, or hematuria.  While she feels very weak in her lower extremities, she denies significant numbness or weakness in her upper extremities.        Past Medical History:  Diagnosis Date  . Anxiety   . Arthritis    right knee  . Asthma   . Depression    catatonic depression per pt report  . HTN (hypertension)   . Thyroid disease     Patient Active Problem List   Diagnosis Date Noted  . Weakness 08/29/2020  . Drug reaction, subsequent encounter 09/23/2019  . Rash and nonspecific skin eruption 09/23/2019  . Stable angina pectoris ([place]) 09/10/2019  . Stage 3a chronic kidney disease ([place]) 09/10/2019  . Idiopathic gout involving toe of right foot 12/07/2018   . Primary osteoarthritis of right knee 03/07/2017  . Hematuria 09/11/2016  . Venous insufficiency of both lower extremities 09/04/2016  . Severe recurrent major depression without psychotic features ([place])   . Hypercholesteremia 05/02/2015  . Mitral valve prolapse 02/21/2015  . Allergic rhinitis 12/24/2014  . Osteopenia 12/24/2014  . Hypothyroidism 11/24/2014  . Generalized anxiety disorder 02/24/2012  . Nocturnal hypoxemia 01/20/2012  . Abdominal mass 01/20/2012  . Temporary cerebral vascular dysfunction 01/02/2009  . Benign hypertension 03/28/2008  . Asthma, exogenous 10/22/2007  . Adaptive colitis 03/12/2003    Past Surgical History:  Procedure Laterality Date  . ABDOMINAL HYSTERECTOMY  1990  . BLADDER SUSPENSION  1990  . BREAST CYST ASPIRATION Right 1985  . BREAST EXCISIONAL BIOPSY Right 1967   EXCISIONAL BX - NEG  . BREAST SURGERY Right 1967   Lumpectomy  . CESAREAN SECTION    . COLONOSCOPY WITH PROPOFOL N/A 12/18/2018   Procedure: COLONOSCOPY WITH PROPOFOL;  Surgeon: Lollie Sails, MD;  Location: [facility] ENDOSCOPY;  Service: Endoscopy;  Laterality: N/A;  . [place]    Prior to Admission medications   Medication Sig Start Date End Date Taking? Authorizing Provider  albuterol (VENTOLIN HFA) 108 (90 Base) MCG/ACT inhaler INHALE 2 PUFFS INTO THE LUNGS EVERY 6 HOURS AS NEEDED 08/10/19   Bacigalupo, Dionne Bucy, MD  amLODipine (NORVASC) 5 MG tablet Take 5 mg by mouth 2 (two) times daily.  [provider]  aspirin 81 MG tablet Take 81 mg by mouth daily.    [provider]  citalopram (CELEXA) 10 MG tablet Take 10 mg by mouth daily. 08/08/19   [provider]  hydrochlorothiazide (HYDRODIURIL) 12.5 MG tablet TAKE 1 TABLET(12.5 MG) BY MOUTH DAILY 06/09/20   Virginia Crews, MD  hydrOXYzine (ATARAX/VISTARIL) 25 MG tablet Take 25 mg by mouth 4 (four) times daily as needed.    [provider]  KRILL OIL  PO Take 1 capsule by mouth daily. Reported on 06/02/2015    [provider]  levothyroxine (SYNTHROID) 112 MCG tablet Take 1 tablet (112 mcg total) by mouth daily. 08/03/20   Virginia Crews, MD  lisinopril (PRINIVIL,ZESTRIL) 20 MG tablet Take 20 mg by mouth 2 (two) times daily.     [provider]  metoprolol tartrate (LOPRESSOR) 50 MG tablet Take 1 tablet (50 mg total) by mouth 2 (two) times daily. Patient taking differently: Take 25 mg by mouth 2 (two) times daily. 06/01/19   Virginia Crews, MD  Multiple Vitamin (MULTIVITAMIN) capsule Take 1 capsule by mouth daily.    [provider]  nortriptyline (PAMELOR) 25 MG capsule Take 4 capsule at bed time Patient taking differently: Take 3 capsule at bed time 04/08/12   Arfeen, Arlyce Harman, MD  Omega 3-6-9 CAPS Take by mouth daily.     [provider]  polyethylene glycol (MIRALAX / GLYCOLAX) 17 g packet Take 17 g by mouth daily. Mix in 4-8 oz of fluid    [provider]  QUEtiapine (SEROQUEL) 100 MG tablet Take 200 mg by mouth at bedtime.     [provider]    Allergies Penicillins and Lipitor [atorvastatin]  Family History  Problem Relation Age of Onset  . Depression Father   . Suicidality Father   . Depression Sister   . Breast cancer Sister 76  . Autism Son   . Heart disease Mother   . Colon cancer Mother   . Hypertension Mother   . Anxiety disorder Mother   . Mental illness Daughter   . Allergies Brother   . Allergies Sister   . Asthma Maternal Uncle   . Heart disease Maternal Grandfather   . Heart disease Maternal Grandmother   . Breast cancer Paternal Grandmother   . Breast cancer Other     Social History Social History   Tobacco Use  . Smoking status: Never Smoker  . Smokeless tobacco: Never Used  Vaping Use  . Vaping Use: Never used  Substance Use Topics  . Alcohol use: No  . Drug use: No    Review of Systems  Constitutional: No fever/chills.  Positive  for weakness and falls. Eyes: No visual changes. ENT: No sore throat. Cardiovascular: Denies chest pain. Respiratory: Denies shortness of breath. Gastrointestinal: No abdominal pain.  No nausea, no vomiting.  No diarrhea.  No constipation. Genitourinary: Negative for dysuria. Musculoskeletal: Negative for back pain. Skin: Negative for rash. Neurological: Negative for headaches, focal weakness or numbness.  ____________________________________________   PHYSICAL EXAM:  VITAL SIGNS: ED Triage Vitals  Enc Vitals Group     BP      Pulse      Resp      Temp      Temp src      SpO2      Weight      Height      Head Circumference      Peak Flow  Pain Score      Pain Loc      Pain Edu?      Excl. in [place]?     Constitutional: Alert and oriented. Eyes: Conjunctivae are normal. Head: Old bruising lateral to right eye from previous fall. Nose: No congestion/rhinnorhea. Mouth/Throat: Mucous membranes are moist. Neck: Normal ROM, no midline cervical spine tenderness to palpation. Cardiovascular: Normal rate, regular rhythm. Grossly normal heart sounds.  2+ radial pulses bilaterally. Respiratory: Normal respiratory effort.  No retractions. Lungs CTAB. Gastrointestinal: Soft and nontender. No distention. Genitourinary: deferred Musculoskeletal: No lower extremity tenderness nor edema.  Bruising and swelling noted to dorsum of right hand. Neurologic:  Normal speech and language. No gross focal neurologic deficits are appreciated. Skin:  Skin is warm, dry and intact. No rash noted. Psychiatric: Mood and affect are normal. Speech and behavior are normal.  ____________________________________________   LABS (all labs ordered are listed, but only abnormal results are displayed)  Labs Reviewed  CBC WITH DIFFERENTIAL/PLATELET - Abnormal; Notable for the following components:      Result Value   RBC 2.92 (*)    Hemoglobin 9.7 (*)    HCT 28.8 (*)    All other components within  normal limits  COMPREHENSIVE METABOLIC PANEL - Abnormal; Notable for the following components:   Sodium 131 (*)    Glucose, Bld 102 (*)    Creatinine, Ser 1.20 (*)    GFR, Estimated 48 (*)    All other components within normal limits  SARS CORONAVIRUS 2 (TAT 6-24 HRS)  URINALYSIS, COMPLETE (UACMP) WITH MICROSCOPIC  TROPONIN I (HIGH SENSITIVITY)  TROPONIN I (HIGH SENSITIVITY)   ____________________________________________  EKG  ED ECG REPORT I, Blake Divine, the attending physician, personally viewed and interpreted this ECG.   Date: 08/29/2020  EKG Time: 14:43  Rate: 63  Rhythm: normal sinus rhythm  Axis: Normal  Intervals:none  ST&T Change: None   PROCEDURES  Procedure(s) performed (including Critical Care):  Procedures   ____________________________________________   INITIAL IMPRESSION / ASSESSMENT AND PLAN / ED COURSE       75 year old female with past medical history of hypertension, hyperlipidemia, CKD, and asthma who presents to the ED with worsening lower extremity weakness over the past couple of weeks, now culminating in inability to walk since this morning.  She had a fall while volunteering at the hospital yesterday, but work-up at that time was negative for traumatic injury.  She did have downtrending hemoglobin and worsening creatinine, we will recheck labs today but she denies any bleeding.  We will screen chest x-ray and UA for infectious process.  She will likely require admission to the hospital for further work-up and potential rule out of stroke.  Chest x-ray reviewed by me and shows no infiltrate, edema, or effusion.  X-ray of right hand does show possible nondisplaced fracture of distal second metacarpal.  Patient was placed in volar short arm splint, remains neurovascularly intact to her distal right upper extremity.  CT head is negative for acute process and labs are unremarkable, case discussed with hospitalist for admission.       ____________________________________________   FINAL CLINICAL IMPRESSION(S) / ED DIAGNOSES  Final diagnoses:  Generalized weakness  Multiple falls     ED Discharge Orders    None       Note:  This document was prepared using Dragon voice recognition software and may include unintentional dictation errors.   Blake Divine, MD 08/29/20 [phone]

## 2020-08-29 NOTE — Telephone Encounter (Signed)
Wanda Ochoa (Pt's son advised) he states they are going to call rescue and have Ms. Valletta transported back to the ER.   Thanks,   -Mickel Baas

## 2020-08-29 NOTE — ED Triage Notes (Signed)
Pt to ED from home for evaluation of lower extremity weakness, I stood up to do something and my knees just collapsed". Patient was seen in the ED yesterday for fall and was discharged home. Today, patient presents with swelling and contusion to the posterior R hand. Pt reports she has been falling. a lot lately. Only reports pain in the right hand.

## 2020-08-29 NOTE — Telephone Encounter (Signed)
Please advise 

## 2020-08-29 NOTE — ED Notes (Signed)
Patient attempted to walk into hallway bathroom from stretcher, but could not move her legs once standing. Patient then tried to use the bedpan, but was unable to void. Patient has been taken to MRI.

## 2020-08-30 ENCOUNTER — Observation Stay: Payer: Medicare Other

## 2020-08-30 ENCOUNTER — Encounter: Payer: Self-pay | Admitting: Internal Medicine

## 2020-08-30 DIAGNOSIS — N1832 Chronic kidney disease, stage 3b: Secondary | ICD-10-CM | POA: Diagnosis present

## 2020-08-30 DIAGNOSIS — W19XXXA Unspecified fall, initial encounter: Secondary | ICD-10-CM | POA: Diagnosis present

## 2020-08-30 DIAGNOSIS — F411 Generalized anxiety disorder: Secondary | ICD-10-CM

## 2020-08-30 DIAGNOSIS — I129 Hypertensive chronic kidney disease with stage 1 through stage 4 chronic kidney disease, or unspecified chronic kidney disease: Secondary | ICD-10-CM | POA: Diagnosis present

## 2020-08-30 DIAGNOSIS — F32A Depression, unspecified: Secondary | ICD-10-CM | POA: Diagnosis not present

## 2020-08-30 DIAGNOSIS — S62302A Unspecified fracture of third metacarpal bone, right hand, initial encounter for closed fracture: Secondary | ICD-10-CM | POA: Diagnosis not present

## 2020-08-30 DIAGNOSIS — M2550 Pain in unspecified joint: Secondary | ICD-10-CM | POA: Diagnosis not present

## 2020-08-30 DIAGNOSIS — F064 Anxiety disorder due to known physiological condition: Secondary | ICD-10-CM | POA: Diagnosis not present

## 2020-08-30 DIAGNOSIS — M10071 Idiopathic gout, right ankle and foot: Secondary | ICD-10-CM | POA: Diagnosis present

## 2020-08-30 DIAGNOSIS — Z736 Limitation of activities due to disability: Secondary | ICD-10-CM | POA: Diagnosis not present

## 2020-08-30 DIAGNOSIS — J45909 Unspecified asthma, uncomplicated: Secondary | ICD-10-CM | POA: Diagnosis present

## 2020-08-30 DIAGNOSIS — S62302D Unspecified fracture of third metacarpal bone, right hand, subsequent encounter for fracture with routine healing: Secondary | ICD-10-CM | POA: Diagnosis not present

## 2020-08-30 DIAGNOSIS — S0990XA Unspecified injury of head, initial encounter: Secondary | ICD-10-CM | POA: Diagnosis present

## 2020-08-30 DIAGNOSIS — E871 Hypo-osmolality and hyponatremia: Secondary | ICD-10-CM | POA: Diagnosis not present

## 2020-08-30 DIAGNOSIS — R531 Weakness: Secondary | ICD-10-CM | POA: Diagnosis present

## 2020-08-30 DIAGNOSIS — E785 Hyperlipidemia, unspecified: Secondary | ICD-10-CM | POA: Diagnosis not present

## 2020-08-30 DIAGNOSIS — Z803 Family history of malignant neoplasm of breast: Secondary | ICD-10-CM | POA: Diagnosis not present

## 2020-08-30 DIAGNOSIS — E039 Hypothyroidism, unspecified: Secondary | ICD-10-CM | POA: Diagnosis present

## 2020-08-30 DIAGNOSIS — F332 Major depressive disorder, recurrent severe without psychotic features: Secondary | ICD-10-CM

## 2020-08-30 DIAGNOSIS — Z79899 Other long term (current) drug therapy: Secondary | ICD-10-CM | POA: Diagnosis not present

## 2020-08-30 DIAGNOSIS — Z7982 Long term (current) use of aspirin: Secondary | ICD-10-CM | POA: Diagnosis not present

## 2020-08-30 DIAGNOSIS — Z79890 Hormone replacement therapy (postmenopausal): Secondary | ICD-10-CM | POA: Diagnosis not present

## 2020-08-30 DIAGNOSIS — E538 Deficiency of other specified B group vitamins: Secondary | ICD-10-CM | POA: Diagnosis present

## 2020-08-30 DIAGNOSIS — E569 Vitamin deficiency, unspecified: Secondary | ICD-10-CM | POA: Diagnosis not present

## 2020-08-30 DIAGNOSIS — I10 Essential (primary) hypertension: Secondary | ICD-10-CM | POA: Diagnosis not present

## 2020-08-30 DIAGNOSIS — Z7401 Bed confinement status: Secondary | ICD-10-CM | POA: Diagnosis not present

## 2020-08-30 DIAGNOSIS — R296 Repeated falls: Secondary | ICD-10-CM | POA: Diagnosis present

## 2020-08-30 DIAGNOSIS — Z20822 Contact with and (suspected) exposure to covid-19: Secondary | ICD-10-CM | POA: Diagnosis present

## 2020-08-30 DIAGNOSIS — M79641 Pain in right hand: Secondary | ICD-10-CM | POA: Diagnosis not present

## 2020-08-30 DIAGNOSIS — F333 Major depressive disorder, recurrent, severe with psychotic symptoms: Secondary | ICD-10-CM | POA: Diagnosis not present

## 2020-08-30 DIAGNOSIS — F4321 Adjustment disorder with depressed mood: Secondary | ICD-10-CM | POA: Diagnosis present

## 2020-08-30 DIAGNOSIS — M1711 Unilateral primary osteoarthritis, right knee: Secondary | ICD-10-CM | POA: Diagnosis present

## 2020-08-30 DIAGNOSIS — D631 Anemia in chronic kidney disease: Secondary | ICD-10-CM | POA: Diagnosis present

## 2020-08-30 DIAGNOSIS — M6281 Muscle weakness (generalized): Secondary | ICD-10-CM | POA: Diagnosis not present

## 2020-08-30 DIAGNOSIS — R2681 Unsteadiness on feet: Secondary | ICD-10-CM | POA: Diagnosis not present

## 2020-08-30 DIAGNOSIS — F061 Catatonic disorder due to known physiological condition: Secondary | ICD-10-CM | POA: Diagnosis present

## 2020-08-30 DIAGNOSIS — Z818 Family history of other mental and behavioral disorders: Secondary | ICD-10-CM | POA: Diagnosis not present

## 2020-08-30 DIAGNOSIS — Z634 Disappearance and death of family member: Secondary | ICD-10-CM | POA: Diagnosis not present

## 2020-08-30 DIAGNOSIS — E782 Mixed hyperlipidemia: Secondary | ICD-10-CM | POA: Diagnosis present

## 2020-08-30 LAB — LIPID PANEL
Cholesterol: 145 mg/dL (ref 0–200)
HDL: 55 mg/dL (ref >40)
LDL Cholesterol: 72 mg/dL (ref 0–99)
Total CHOL/HDL Ratio: 2.6 RATIO
Triglycerides: 88 mg/dL (ref <150)
VLDL: 18 mg/dL (ref 0–40)

## 2020-08-30 LAB — HEMOGLOBIN A1C
Hgb A1c MFr Bld: 5.2 % (ref 4.8–5.6)
Mean Plasma Glucose: 103 mg/dL

## 2020-08-30 LAB — VITAMIN B12: Vitamin B-12: 107 pg/mL — ABNORMAL LOW (ref 180–914)

## 2020-08-30 LAB — SARS CORONAVIRUS 2 (TAT 6-24 HRS): SARS Coronavirus 2: NEGATIVE

## 2020-08-30 MED ORDER — CITALOPRAM HYDROBROMIDE 20 MG PO TABS
20.0000 mg | ORAL_TABLET | Freq: Every day | ORAL | Status: DC
  Administered 2020-08-31 – 2020-09-04 (×5): 20 mg via ORAL
  Filled 2020-08-30 (×5): qty 1

## 2020-08-30 MED ORDER — NORTRIPTYLINE HCL 25 MG PO CAPS
75.0000 mg | ORAL_CAPSULE | Freq: Every day | ORAL | Status: DC
  Administered 2020-08-30 – 2020-09-03 (×5): 75 mg via ORAL
  Filled 2020-08-30 (×6): qty 3

## 2020-08-30 MED ORDER — LEVOTHYROXINE SODIUM 50 MCG PO TABS
150.0000 ug | ORAL_TABLET | Freq: Every day | ORAL | Status: DC
  Administered 2020-08-31 – 2020-09-04 (×5): 150 ug via ORAL
  Filled 2020-08-30 (×5): qty 1

## 2020-08-30 NOTE — Progress Notes (Signed)
SLP Cancellation Note  Patient Details Name: Wanda Ochoa MRN: [id number] DOB: 1945/08/27   Cancelled treatment:       Reason Eval/Treat Not Completed: SLP screened, no needs identified, will sign off (chart reviewed; consulted NSG then met w/ pt). Pt denied any difficulty swallowing and is currently sipping on water in room (on tray table). MD placed a regular diet; tolerates swallowing pills w/ water per NSG. Pt conversed at conversational level w/out deficits noted; pt denied any speech-language deficits. She was able to answer questions, was oriented x4, and then give details to events leading up to her admission -- she endorsed "weakness" and difficulty walking/falling since ~4-5 weeks ago. She also endorsed having ECT txs during this timeframe as well. Encouraged her to discuss this further w/ PT/OT and her MD this admit.  No further skilled ST services indicated as pt appears at her baseline. Pt agreed. NSG to reconsult if any change in status.      Orinda Kenner, MS, CCC-SLP Speech Language Pathologist Rehab Services [phone] [facility] 08/30/2020, 10:02 AM

## 2020-08-30 NOTE — Consult Note (Signed)
[facility] Consult   Reason for Consult: Consult follow-up with this 75 year old woman with a history of depression currently in the hospital after multiple falls Referring Physician:  Mal Misty Patient Identification: WHITTLEY Ochoa MRN:  [id number] Principal Diagnosis: Severe recurrent major depression without psychotic features ([place]) Diagnosis:  Principal Problem:   Severe recurrent major depression without psychotic features ([place]) Active Problems:   Generalized anxiety disorder   Hypothyroidism   Benign hypertension   Hypercholesteremia   Primary osteoarthritis of right knee   Idiopathic gout involving toe of right foot   Stage 3a chronic kidney disease ([place])   Weakness   Complicated grieving   Frequent falls   Total Time spent with patient: 1 hour  Subjective:   Wanda Ochoa is a 75 y.o. female patient admitted with "I am feeling fine".  HPI: 75 year old woman with a past history of depression and anxiety who was admitted to the hospital after the most recent of several falls that she has suffered.  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  I am involved in the patient's regular outpatient treatment of her mental health.  On interview today I found the patient sitting up awake with her sister visiting.  Patient was smiling and cooperative.  Stated her mood was feeling fine.  Insisted that she was not feeling depressed at all.  Absolutely denied any suicidal thoughts.  Patient claims that she has been compliant at home with her prescribed medicines for depression of citalopram and Pamelor and Seroquel.  As far as the falls, patient insists that she has been eating and drinking normally at home.  Insists that she has been taking her medicine normally as prescribed at home.  The patient ventures a vague thought that perhaps the falls were related to her ECT treatment.  Most recent ECT was May 16.  She has not been having  them very frequently no more than a week apart.  [name] husband died fairly recently just in the last few months.  They were very close and this has been an obvious enormous stress for her.  Since his death her son then has been living with her.  Been has "high functioning" autism.  Past Psychiatric History: Patient has a long history of chronic anxiety disorder.  I first got to know her for treatment when she suffered a bout of acute catatonia several years ago that resulted in a brief hospitalization.  He responded extremely quickly to ECT.  Since then I have seen her at very widely spaced intervals when she has requested repeat ECT treatments.  She has usually requested these when she gets a vague subjective feeling that her anxiety is getting worse and starts to fear she might become catatonic.  She inevitably has reported that she feels much better after just single treatments and does not continue with a full course.  Meanwhile patient sees her outpatient psychiatrist and therapist regularly for medicine management.  Other than the time I saw her in the hospital no history of hospitalizations.  No history of suicide attempts.  Risk to Self:   Risk to Others:   Prior Inpatient Therapy:   Prior Outpatient Therapy:    Past Medical History:  Past Medical History:  Diagnosis Date  . Anxiety   . Arthritis    right knee  . Asthma   . Depression    catatonic depression per pt report  . HTN (hypertension)   .  Thyroid disease     Past Surgical History:  Procedure Laterality Date  . ABDOMINAL HYSTERECTOMY  1990  . BLADDER SUSPENSION  1990  . BREAST CYST ASPIRATION Right 1985  . BREAST EXCISIONAL BIOPSY Right 1967   EXCISIONAL BX - NEG  . BREAST SURGERY Right 1967   Lumpectomy  . CESAREAN SECTION    . COLONOSCOPY WITH PROPOFOL N/A 12/18/2018   Procedure: COLONOSCOPY WITH PROPOFOL;  Surgeon: Lollie Sails, MD;  Location: [facility] ENDOSCOPY;  Service: Endoscopy;  Laterality: N/A;  .  HEMORRHOIDECTOMY WITH HEMORRHOID BANDING  1988   Family History:  Family History  Problem Relation Age of Onset  . Depression Father   . Suicidality Father   . Depression Sister   . Breast cancer Sister 59  . Autism Son   . Heart disease Mother   . Colon cancer Mother   . Hypertension Mother   . Anxiety disorder Mother   . Mental illness Daughter   . Allergies Brother   . Allergies Sister   . Asthma Maternal Uncle   . Heart disease Maternal Grandfather   . Heart disease Maternal Grandmother   . Breast cancer Paternal Grandmother   . Breast cancer Other    Family Psychiatric  History: Has 1 son who has chronic mental health problems and has been diagnosed with autistic spectrum disorder.  As a result both the patient and her husband were deeply involved in charities and support organizations for mental illness for years. Social History:  Social History   Substance and Sexual Activity  Alcohol Use No     Social History   Substance and Sexual Activity  Drug Use No    Social History   Socioeconomic History  . Marital status: Married    Spouse name: Shonteria Abeln  . Number of children: 3  . Years of education: 16  . Highest education level: Bachelor's degree (e.g., BA, AB, BS)  Occupational History  . 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 Smoking status: Never Smoker  . Smokeless tobacco: Never Used  Vaping Use  . Vaping Use: Never used  Substance and Sexual Activity  . Alcohol use: No  . Drug use: No  . Sexual activity: Not on file  Other Topics Concern  . Not on file  Social History Narrative  . Not on file   Social Determinants of Health   Financial Resource Strain: Not on file  Food Insecurity: Not on file  Transportation Needs: Not on file  Physical Activity: Not on file  Stress: Not on file  Social Connections: Not on file   Additional Social History:    Allergies:    Allergies  Allergen Reactions  . Penicillins Anaphylaxis  . Lipitor [Atorvastatin] Rash    Severe rash on skin after taking approximately 2 weeks.     Labs:  Results for orders placed or performed during the hospital encounter of 08/29/20 (from the past 48 hour(s))  CBC with Differential     Status: Abnormal   Collection Time: 08/29/20  2:54 PM  Result Value Ref Range   WBC 7.6 4.0 - 10.5 K/uL   RBC 2.92 (L) 3.87 - 5.11 MIL/uL   Hemoglobin 9.7 (L) 12.0 - 15.0 g/dL   HCT 28.8 (L) 36.0 - 46.0 %   MCV 98.6 80.0 - 100.0 fL   MCH 33.2 26.0 - 34.0 pg   MCHC 33.7 30.0 - 36.0  g/dL   RDW 12.6 11.5 - 15.5 %   Platelets 210 150 - 400 K/uL   nRBC 0.0 0.0 - 0.2 %   Neutrophils Relative % 59 %   Neutro Abs 4.5 1.7 - 7.7 K/uL   Lymphocytes Relative 28 %   Lymphs Abs 2.1 0.7 - 4.0 K/uL   Monocytes Relative 11 %   Monocytes Absolute 0.9 0.1 - 1.0 K/uL   Eosinophils Relative 2 %   Eosinophils Absolute 0.1 0.0 - 0.5 K/uL   Basophils Relative 0 %   Basophils Absolute 0.0 0.0 - 0.1 K/uL   Immature Granulocytes 0 %   Abs Immature Granulocytes 0.03 0.00 - 0.07 K/uL    Comment: Performed at [facility], [address] [place], [place] [address]  Comprehensive metabolic panel     Status: Abnormal   Collection Time: 08/29/20  2:54 PM  Result Value Ref Range   Sodium 131 (L) 135 - 145 mmol/L   Potassium 4.3 3.5 - 5.1 mmol/L   Chloride 100 98 - 111 mmol/L   CO2 25 22 - 32 mmol/L   Glucose, Bld 102 (H) 70 - 99 mg/dL    Comment: Glucose reference range applies only to samples taken after fasting for at least 8 hours.   BUN 17 8 - 23 mg/dL   Creatinine, Ser 1.20 (H) 0.44 - 1.00 mg/dL   Calcium 8.9 8.9 - 10.3 mg/dL   Total Protein 7.7 6.5 - 8.1 g/dL   Albumin 4.0 3.5 - 5.0 g/dL   AST 23 15 - 41 U/L   ALT 17 0 - 44 U/L   Alkaline Phosphatase 90 38 - 126 U/L   Total Bilirubin 1.0 0.3 - 1.2 mg/dL   GFR, Estimated 48 (L) >60 mL/min    Comment: (NOTE) Calculated using the CKD-EPI  Creatinine Equation (2021)    Anion gap 6 5 - 15    Comment: Performed at [facility], [place], [place] [address]  Troponin I (High Sensitivity)     Status: None   Collection Time: 08/29/20  2:54 PM  Result Value Ref Range   Troponin I (High Sensitivity) 4 <18 ng/L    Comment: (NOTE) Elevated high sensitivity troponin I (hsTnI) values and significant  changes across serial measurements may suggest ACS but many other  chronic and acute conditions are known to elevate hsTnI results.  Refer to the "Links" section for chest pain algorithms and additional  guidance. Performed at [facility], [place],  [address]   SARS CORONAVIRUS 2 (TAT 6-24 HRS) Nasopharyngeal Nasopharyngeal Swab     Status: None   Collection Time: 08/29/20  2:54 PM   Specimen: Nasopharyngeal Swab  Result Value Ref Range   SARS Coronavirus 2 NEGATIVE NEGATIVE    Comment: (NOTE) SARS-CoV-2 target nucleic acids are NOT DETECTED.  The SARS-CoV-2 RNA is generally detectable in upper and lower respiratory specimens during the acute phase of infection. Negative results do not preclude SARS-CoV-2 infection, do not rule out co-infections with other pathogens, and should not be used as the sole basis for treatment or other patient management decisions. Negative results must be combined with clinical observations, patient history, and epidemiological information. The expected result is Negative.  Fact Sheet for Patients: SugarRoll.be  Fact Sheet for Healthcare Providers: https://www.woods-mathews.com/  This test is not yet approved or cleared by the [place] FDA and  has been authorized for detection and/or diagnosis of SARS-CoV-2 by FDA under an  Emergency Use Authorization (EUA). This EUA will remain  in effect (meaning this test can be used) for the duration of the COVID-19 declaration under Se ction 564(b)(1) of  the Act, 21 U.S.C. section 360bbb-3(b)(1), unless the authorization is terminated or revoked sooner.  Performed at [facility], [place] [address] [place], [place] [address]   TSH     Status: Abnormal   Collection Time: 08/29/20  2:54 PM  Result Value Ref Range   TSH 45.707 (H) 0.350 - 4.500 uIU/mL    Comment: Performed by a 3rd Generation assay with a functional sensitivity of <=0.01 uIU/mL. Performed at [facility], [address] [place], [place] [address]   Urinalysis, Complete w Microscopic     Status: Abnormal   Collection Time: 08/29/20  7:20 PM  Result Value Ref Range   Color, Urine STRAW (A) YELLOW   APPearance CLEAR (A) CLEAR   Specific Gravity, Urine 1.005 1.005 - 1.030   pH 7.0 5.0 - 8.0   Glucose, UA NEGATIVE NEGATIVE mg/dL   Hgb urine dipstick NEGATIVE NEGATIVE   Bilirubin Urine NEGATIVE NEGATIVE   Ketones, ur NEGATIVE NEGATIVE mg/dL   Protein, ur NEGATIVE NEGATIVE mg/dL   Nitrite NEGATIVE NEGATIVE   Leukocytes,Ua NEGATIVE NEGATIVE   WBC, UA 0-5 0 - 5 WBC/hpf   Bacteria, UA NONE SEEN NONE SEEN   Squamous Epithelial / LPF 0-5 0 - 5    Comment: Performed at [facility], [place], [place] [address]  Troponin I (High Sensitivity)     Status: None   Collection Time: 08/29/20  8:59 PM  Result Value Ref Range   Troponin I (High Sensitivity) 5 <18 ng/L    Comment: (NOTE) Elevated high sensitivity troponin I (hsTnI) values and significant  changes across serial measurements may suggest ACS but many other  chronic and acute conditions are known to elevate hsTnI results.  Refer to the "Links" section for chest pain algorithms and additional  guidance. Performed at [facility], [address] [place], [place] [address]   Lipid panel     Status: None   Collection Time: 08/30/20  5:07 AM  Result Value Ref Range   Cholesterol 145 0 - 200 mg/dL   Triglycerides 88 <150 mg/dL   HDL 55 >40 mg/dL   Total CHOL/HDL Ratio  2.6 RATIO   VLDL 18 0 - 40 mg/dL   LDL Cholesterol 72 0 - 99 mg/dL    Comment:        Total Cholesterol/HDL:CHD Risk Coronary Heart Disease Risk Table                     Men   Women  1/2 Average Risk   3.4   3.3  Average Risk       5.0   4.4  2 X Average Risk   9.6   7.1  3 X Average Risk  23.4   11.0        Use the calculated Patient Ratio above and the CHD Risk Table to determine the patient's CHD Risk.        ATP III CLASSIFICATION (LDL):  <100     mg/dL   Optimal  100-129  mg/dL   Near or Above                    Optimal  130-159  mg/dL   Borderline  160-189  mg/dL   High  >190  mg/dL   Very High Performed at [facility], [address] [place], [place] [address]     Current Facility-Administered Medications  Medication Dose Route Frequency Provider Last Rate Last Admin  .  stroke: mapping our early stages of recovery book   Does not apply Once Cox, Amy N, DO      . acetaminophen (TYLENOL) tablet 650 mg  650 mg Oral Q4H PRN Cox, Amy N, DO       Or  . acetaminophen (TYLENOL) 160 MG/5ML solution 650 mg  650 mg Per Tube Q4H PRN Cox, Amy N, DO       Or  . acetaminophen (TYLENOL) suppository 650 mg  650 mg Rectal Q4H PRN Cox, Amy N, DO      . ALPRAZolam Duanne Moron) tablet 0.25 mg  0.25 mg Oral Daily PRN Cox, Amy N, DO      . aspirin chewable tablet 81 mg  81 mg Oral Daily Cox, Amy N, DO   81 mg at 08/30/20 0918  . [START ON 08/31/2020] citalopram (CELEXA) tablet 20 mg  20 mg Oral Daily Roxsana Riding T, MD      . enoxaparin (LOVENOX) injection 40 mg  40 mg Subcutaneous Q24H Cox, Amy N, DO   40 mg at 08/29/20 2048  . hydrALAZINE (APRESOLINE) tablet 25 mg  25 mg Oral Q8H PRN Cox, Amy N, DO      . hydrOXYzine (VISTARIL) capsule 25 mg  25 mg Oral Daily PRN Cox, Amy N, DO      . labetalol (NORMODYNE) tablet 200 mg  200 mg Oral BID Cox, Amy N, DO   200 mg at 08/30/20 0919  . [START ON 08/31/2020] levothyroxine (SYNTHROID) tablet 150 mcg  150 mcg Oral Q0600 Jennye Boroughs, MD       . nortriptyline (PAMELOR) capsule 75 mg  75 mg Oral QHS Zylpha Poynor T, MD      . pravastatin (PRAVACHOL) tablet 20 mg  20 mg Oral QHS Cox, Amy N, DO   20 mg at 08/29/20 2046  . QUEtiapine (SEROQUEL) tablet 100 mg  100 mg Oral QHS Cox, Amy N, DO   25 mg at 08/29/20 2046    Musculoskeletal: Strength & Muscle Tone: within normal limits Gait & Station: Not observed.  Recently she had seemed to usually be steady on her feet but for some reason has been having these intermittent falls Patient leans: N/A            Psychiatric Specialty Exam:  Presentation  General Appearance: No data recorded Eye Contact:No data recorded Speech:No data recorded Speech Volume:No data recorded Handedness:No data recorded  Mood and Affect  Mood:No data recorded Affect:No data recorded  Thought Process  Thought Processes:No data recorded Descriptions of Associations:No data recorded Orientation:No data recorded Thought Content:No data recorded History of Schizophrenia/Schizoaffective disorder:No data recorded Duration of Psychotic Symptoms:No data recorded Hallucinations:No data recorded Ideas of Reference:No data recorded Suicidal Thoughts:No data recorded Homicidal Thoughts:No data recorded  Sensorium  Memory:No data recorded Judgment:No data recorded Insight:No data recorded  Executive Functions  Concentration:No data recorded Attention Span:No data recorded Recall:No data recorded Fund of Knowledge:No data recorded Language:No data recorded  Psychomotor Activity  Psychomotor Activity:No data recorded  Assets  Assets:No data recorded  Sleep  Sleep:No data recorded  Physical Exam: Physical Exam Vitals and nursing note reviewed.  Constitutional:      Appearance: Normal appearance.  HENT:     Head: Normocephalic and atraumatic.     Mouth/Throat:  Pharynx: Oropharynx is clear.  Eyes:     Pupils: Pupils are equal, round, and reactive to light.   Cardiovascular:     Rate and Rhythm: Normal rate and regular rhythm.  Pulmonary:     Effort: Pulmonary effort is normal.     Breath sounds: Normal breath sounds.  Abdominal:     General: Abdomen is flat.     Palpations: Abdomen is soft.  Musculoskeletal:        General: Normal range of motion.  Skin:    General: Skin is warm and dry.  Neurological:     General: No focal deficit present.     Mental Status: She is alert. Mental status is at baseline.  Psychiatric:        Attention and Perception: Attention normal.        Mood and Affect: Mood normal.        Speech: Speech normal.        Behavior: Behavior normal.        Thought Content: Thought content normal.        Cognition and Memory: Cognition normal.        Judgment: Judgment normal.    Review of Systems  Constitutional: Negative.   HENT: Negative.   Eyes: Negative.   Respiratory: Negative.   Cardiovascular: Negative.   Gastrointestinal: Negative.   Musculoskeletal: Negative.   Skin: Negative.   Neurological: Negative.   Psychiatric/Behavioral: Negative.    Blood pressure (!) 159/73, pulse (!) 58, temperature 98.3 F (36.8 C), resp. rate 17, height 5\' 9"  (1.753 m), weight 78.9 kg, SpO2 99 %. Body mass index is 25.7 kg/m.  Treatment Plan Summary: Plan I confirmed with her outpatient psychiatrist that the patient is regularly on the Pamelor Seroquel and Celexa.  She has been on these medicines for quite a while.  If they were going to be directly causing orthostasis and falls you would expect that it would happen more frequently than it has been.  The falls that she had had still remained unclear and there because.  One thing I can say is that there is no reason to think the ECT is causing them.  That is simply not something that ECT does.  I am concerned that her very elevated TSH on admission here may suggest that she has been noncompliant with medicines at home which may further suggest even more noncompliance or not  eating and drinking well at home but the patient of course insists that everything has been fine.  At this point there is no reason to change any of her psychiatric medicine.  I see that there is recommendation for her to go to rehab and perhaps to assisted living afterwards which I think would be a good plan.  Will continue to follow.  Disposition: No evidence of imminent risk to self or others at present.   Patient does not meet criteria for psychiatric inpatient admission. Supportive therapy provided about ongoing stressors.  [name], MD 08/30/2020 7:15 PM

## 2020-08-30 NOTE — Progress Notes (Signed)
Rehab Admissions Coordinator Note:  Patient was screened by Cleatrice Burke for appropriateness for an Inpatient Acute Rehab Consult per PT and OT recommendations. Noted medical work up ongoing with no acute abnormality on MRI and CT head. I question if patient would have the medical neccesity for an  inpatient acute rehabilitation admit. I will discuss with Dr. Naaman Plummer and follow up with the team Thursday.  Cleatrice Burke RN MSN 08/30/2020, 3:50 PM  I can be reached at [phone].

## 2020-08-30 NOTE — Consult Note (Signed)
ORTHOPAEDIC CONSULTATION  REQUESTING PHYSICIAN: Jennye Boroughs, MD  Chief Complaint:   Right hand pain  History of Present Illness: Wanda Ochoa is a 75 y.o. female who has had multiple recent falls.  She is noted to have significant swelling about the dorsum of the right hand.  Radiographs in the emergency department were concerning for a second metacarpal fracture.  She was placed in a volar splint.  Past Medical History:  Diagnosis Date  . Anxiety   . Arthritis    right knee  . Asthma   . Depression    catatonic depression per pt report  . HTN (hypertension)   . Thyroid disease    Past Surgical History:  Procedure Laterality Date  . ABDOMINAL HYSTERECTOMY  1990  . BLADDER SUSPENSION  1990  . BREAST CYST ASPIRATION Right 1985  . BREAST EXCISIONAL BIOPSY Right 1967   EXCISIONAL BX - NEG  . BREAST SURGERY Right 1967   Lumpectomy  . CESAREAN SECTION    . COLONOSCOPY WITH PROPOFOL N/A 12/18/2018   Procedure: COLONOSCOPY WITH PROPOFOL;  Surgeon: Lollie Sails, MD;  Location: [facility] ENDOSCOPY;  Service: Endoscopy;  Laterality: N/A;  . HEMORRHOIDECTOMY WITH HEMORRHOID BANDING  1988   Social History   Socioeconomic History  . Marital status: Married    Spouse name: Jaxie Racanelli  . Number of children: 3  . Years of education: 16  . Highest education level: Bachelor's degree (e.g., BA, AB, BS)  Occupational History  . 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 Smoking status: Never Smoker  . Smokeless tobacco: Never Used  Vaping Use  . Vaping Use: Never used  Substance and Sexual Activity  . Alcohol use: No  . Drug use: No  . Sexual activity: Not on file  Other Topics Concern  . Not on file  Social History Narrative  . Not on file   Social Determinants of Health   Financial Resource Strain: Not on file  Food  Insecurity: Not on file  Transportation Needs: Not on file  Physical Activity: Not on file  Stress: Not on file  Social Connections: Not on file   Family History  Problem Relation Age of Onset  . Depression Father   . Suicidality Father   . Depression Sister   . Breast cancer Sister 19  . Autism Son   . Heart disease Mother   . Colon cancer Mother   . Hypertension Mother   . Anxiety disorder Mother   . Mental illness Daughter   . Allergies Brother   . Allergies Sister   . Asthma Maternal Uncle   . Heart disease Maternal Grandfather   . Heart disease Maternal Grandmother   . Breast cancer Paternal Grandmother   . Breast cancer Other    Allergies  Allergen Reactions  . Penicillins Anaphylaxis  . Lipitor [Atorvastatin] Rash    Severe rash on skin after taking approximately 2 weeks.    Prior to Admission medications   Medication Sig Start Date End Date Taking? Authorizing Provider  ALPRAZolam Duanne Moron) 0.25 MG tablet Take 0.25 mg by mouth daily as needed for anxiety. 07/31/20  Yes [provider]  aspirin 81 MG tablet Take 81 mg by mouth daily.   Yes [provider]  citalopram (CELEXA) 10 MG tablet Take 10 mg by mouth daily. 08/08/19  Yes [provider]  hydrOXYzine (VISTARIL) 25 MG capsule Take 25 mg  by mouth daily as needed for anxiety. 07/03/20  Yes [provider]  labetalol (NORMODYNE) 200 MG tablet Take 200 mg by mouth 2 (two) times daily. 08/02/20  Yes [provider]  levothyroxine (SYNTHROID) 112 MCG tablet Take 1 tablet (112 mcg total) by mouth daily. 08/03/20  Yes Bacigalupo, Dionne Bucy, MD  nortriptyline (PAMELOR) 25 MG capsule Take 4 capsule at bed time Patient taking differently: Take 3 capsule at bed time 04/08/12  Yes Arfeen, Arlyce Harman, MD  pravastatin (PRAVACHOL) 20 MG tablet Take 20 mg by mouth daily at 12 noon. 07/23/20  Yes [provider]  QUEtiapine (SEROQUEL) 100 MG tablet Take 100 mg by mouth at bedtime.   Yes  [provider]  albuterol (VENTOLIN HFA) 108 (90 Base) MCG/ACT inhaler INHALE 2 PUFFS INTO THE LUNGS EVERY 6 HOURS AS NEEDED 08/10/19   Bacigalupo, Dionne Bucy, MD  hydrochlorothiazide (HYDRODIURIL) 12.5 MG tablet TAKE 1 TABLET(12.5 MG) BY MOUTH DAILY Patient not taking: No sig reported 06/09/20   Virginia Crews, MD  polyethylene glycol (MIRALAX / GLYCOLAX) 17 g packet Take 17 g by mouth daily. Mix in 4-8 oz of fluid    [provider]   Recent Labs    08/28/20 1302 08/29/20 1454  WBC 6.2 7.6  HGB 9.2* 9.7*  HCT 27.8* 28.8*  PLT 207 210  K 5.1 4.3  CL 100 100  CO2 26 25  BUN 24* 17  CREATININE 1.73* 1.20*  GLUCOSE 97 102*  CALCIUM 8.9 8.9   DG Chest 2 View  Result Date: 08/29/2020 CLINICAL DATA:  Weakness. EXAM: CHEST - 2 VIEW COMPARISON:  May 19, 2015. FINDINGS: The heart size and mediastinal contours are within normal limits. Both lungs are clear. The visualized skeletal structures are unremarkable. IMPRESSION: No active cardiopulmonary disease. Electronically Signed   By: Marijo Conception M.D.   On: 08/29/2020 14:54   CT Head Wo Contrast  Result Date: 08/29/2020 CLINICAL DATA:  Lower extremity weakness question stroke, knees collapsed, fall, falling a lot lately, history hypertension EXAM: CT HEAD WITHOUT CONTRAST TECHNIQUE: Contiguous axial images were obtained from the base of the skull through the vertex without intravenous contrast. Sagittal and coronal MPR images reconstructed from axial data set. COMPARISON:  08/28/2020 FINDINGS: Brain: Generalized atrophy. Normal ventricular morphology. No midline shift or mass effect. Small vessel chronic ischemic changes of deep cerebral white matter. No intracranial hemorrhage, mass lesion, evidence of acute infarction, or extra-axial fluid collection. Vascular: Mild atherosclerotic calcification of internal carotid arteries at skull base. Skull: Intact Sinuses/Orbits: Clear Other: N/A IMPRESSION: Atrophy with small  vessel chronic ischemic changes of deep cerebral white matter. No acute intracranial abnormalities. Electronically Signed   By: Lavonia Dana M.D.   On: 08/29/2020 15:05   MR BRAIN WO CONTRAST  Result Date: 08/29/2020 CLINICAL DATA:  Lower extremity weakness EXAM: MRI HEAD WITHOUT CONTRAST TECHNIQUE: Multiplanar, multiecho pulse sequences of the brain and surrounding structures were obtained without intravenous contrast. COMPARISON:  Correlation made with recent CT imaging. Prior MRI is from 20/10 FINDINGS: Brain: There is no acute infarction or intracranial hemorrhage. There is no intracranial mass, mass effect, or edema. There is no hydrocephalus or extra-axial fluid collection. Prominence of the ventricles and sulci reflects generalized parenchymal volume loss. Patchy T2 hyperintensity in the supratentorial white matter is nonspecific but may reflect mild chronic microvascular ischemic changes. Focus of susceptibility hypointensity in the right subinsular white matter likely reflects chronic microhemorrhage. Vascular: Major vessel flow voids at the  skull base are preserved. Skull and upper cervical spine: Normal marrow signal is preserved. Sinuses/Orbits: Trace paranasal sinus mucosal thickening. Orbits are unremarkable. Other: Sella is unremarkable.  Mastoid air cells are clear. IMPRESSION: No acute infarction, hemorrhage, or mass. Mild chronic microvascular ischemic changes. Electronically Signed   By: Macy Mis M.D.   On: 08/29/2020 18:45   DG Hand 2 View Right  Result Date: 08/29/2020 CLINICAL DATA:  Status post fall. EXAM: RIGHT HAND - 2 VIEW COMPARISON:  None FINDINGS: There is marked dorsal soft tissue swelling. Cortical irregularity is identified along the posterior head of the second metacarpal bone concerning for mild impaction fracture. No dislocation. IMPRESSION: 1. Suspect mild impaction fracture involving the posterior head of the second metacarpal bone. Correlation with exact site of  tenderness advised. 2. Marked dorsal soft tissue swelling. Electronically Signed   By: Kerby Moors M.D.   On: 08/29/2020 14:57   DG Hand Complete Right  Result Date: 08/30/2020 CLINICAL DATA:  Right hand pain. EXAM: RIGHT HAND - COMPLETE 3+ VIEW COMPARISON:  August 29, 2020. FINDINGS: The right hand has been splinted. The abnormality involving the posterior portion of the distal head of the second metacarpal noted on prior exam is not well visualized on this radiograph. Dorsal soft tissue swelling is noted. No other definite bony abnormality is noted. IMPRESSION: Right hand has been splinted. Dorsal soft tissue swelling is noted. Abnormality involving posterior portion of distal head of second metacarpal noted on prior exam is not well visualized on this radiograph. Electronically Signed   By: Marijo Conception M.D.   On: 08/30/2020 14:14     Positive ROS: All other systems have been reviewed and were otherwise negative with the exception of those mentioned in the HPI and as above.  Physical Exam: BP (!) 159/73 (BP Location: Left Arm)   Pulse (!) 58   Temp 98.3 F (36.8 C)   Resp 17   Ht 5\' 9"  (1.753 m)   Wt 78.9 kg   SpO2 99%   BMI 25.70 kg/m  General:  Alert, no acute distress Psychiatric:  Patient is competent for consent with normal mood and affect   Cardiovascular:  No pedal edema, regular rate and rhythm Respiratory:  No wheezing, non-labored breathing GI:  Abdomen is soft and non-tender Skin:  No lesions in the area of chief complaint, no erythema Neurologic:  Sensation intact distally, CN grossly intact Lymphatic:  No axillary or cervical lymphadenopathy  Orthopedic Exam:  RUE: +ain/pin/u motor SILT r/u/m/ax +rad pulse Significant tenderness about the third metacarpal head, more moderate tenderness about the fourth metacarpal head, no significant tenderness about the second metacarpal head.  There is significant dorsal hand soft tissue swelling overlying the metacarpal heads 3  and 4.   X-rays:  On personal review of the radiographs of the right hand, there appears to be a mild impaction fracture involving the third metacarpal head (as opposed to the second metacarpal head as indicated by the radiology read).  Assessment/Plan: 75 year old female with subtle impaction fracture of the right third metacarpal head 1.  Recommend splinting for comfort for the next 1-2 weeks.  Nonweightbearing on right upper extremity for approximately 1-2 weeks to allow soft tissue swelling to decrease.  Patient can then wean from splint as tolerated and progressed to full weightbearing and use of hand as tolerated as well. 2.  No plan for surgical intervention. 3.  Recommend OT patient can follow-up as an outpatient at the [facility] in the  next 1-2 weeks for reevaluation.    Leim Fabry   08/30/2020 4:53 PM

## 2020-08-30 NOTE — Progress Notes (Signed)
[name] sons visited today, Hoping to meet with MD. Dr. Mal Misty. Messages sent, waiting on reply   [name] [phone])  [name] [phone])

## 2020-08-30 NOTE — Care Management Obs Status (Signed)
[place] NOTIFICATION   Patient Details  Name: ZEHAVA TURSKI MRN: [id number] Date of Birth: Jun 07, 1945   Medicare Observation Status Notification Given:  Yes    Shelbie Hutching, RN 08/30/2020, 2:03 PM

## 2020-08-30 NOTE — Evaluation (Signed)
Occupational Therapy Evaluation Patient Details Name: Wanda Ochoa MRN: [id number] DOB: Nov 19, 1945 Today's Date: 08/30/2020    History of Present Illness presented to ER secondary to bilat LE weakness, inability to ambulate, frequent falls; admitted for TIA/CVA work up versus management of complicated grieving process. Of note, has been actively receiving ECT treatments (4 total with this episode), most recent session approx 7-10 days prior to this admission.   Clinical Impression   Wanda Ochoa was seen for OT evaluation this date. Prior to hospital admission, pt was active and independent. 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 Pt lives with her 74 year old son, and endorses being recently widowed. She states that the increased stressors of daily life since her husbands's passing have been difficult for her, but she is eager to maintain her independent life. She has experienced multiple falls in the past ~4 weeks, with no falls history prior to this. Pt has RUE splinted with imaging suggesting "mild impaction fracture involving the posterior head of the second metacarpal bone". RUE treated as NWB during session. Ortho consult pending at time of OT evaluation. Increased time taken to monitor pt vitals during session. See chart below. Pt endorses moderate dizziness once up in recliner with BP 90/47. RN in room during assessment and aware.  Currently pt demonstrates impairments as described below (See OT problem list) which functionally limit her ability to perform ADL/self-care tasks. Pt currently requires +2 MAX A to perform sit to stand and stand pivot transfers, MAX A for LB ADL management including bathing and dressing tasks. Pt would benefit from skilled OT services to address noted impairments and functional limitations (see below for any additional details) in order to maximize safety and independence while minimizing falls risk and  caregiver burden. Upon hospital discharge, recommend acute inpatient rehabilitation to maximize pt safety and return to functional independence during meaningful occupations of daily life.    08/30/20 1341  Therapy Vitals  Patient Position (if appropriate) Orthostatic Vitals  Orthostatic Lying   BP- Lying 104/45  Pulse- Lying 59  Orthostatic Sitting  BP- Sitting 103/55  Pulse- Sitting 69  Orthostatic Standing at 0 minutes  BP- Standing at 0 minutes (!) 88/49 (unable to maintain standing for full duration)  Pulse- Standing at 0 minutes 56      Follow Up Recommendations  CIR (Acute Inpatient Rehabilitation)    Equipment Recommendations  Other (comment) (Defer to next venue of care.)    Recommendations for Other Services Rehab consult     Precautions / Restrictions Precautions Precautions: Fall Precaution Comments: orthostatic hypotension Restrictions Weight Bearing Restrictions: No      Mobility Bed Mobility Overal bed mobility: Needs Assistance Bed Mobility: Supine to Sit     Supine to sit: Min guard;Supervision     General bed mobility comments: increased time/effort to complete    Transfers Overall transfer level: Needs assistance Equipment used: 2 person hand held assist Transfers: Sit to/from Stand Sit to Stand: Mod assist;+2 physical assistance         General transfer comment: assist for lift off, anterior weight translation (tendancy towards posterior LOB) and overall stabilization of bilat hips/knees.  Cuing for isometric activation with all standing activities; is able to initiate, but fatigues quickly.  Inconsistent protective righting reactions with posterior weight shift, unsafe to attempt/complete without constant hands-on assist    Balance Overall balance assessment: Needs assistance Sitting-balance support: No upper extremity supported;Feet supported Sitting balance-Leahy Scale: Good Sitting  balance - Comments: Steady static sitting,  reaching within BOS.   Standing balance support: Bilateral upper extremity supported Standing balance-Leahy Scale: Poor Standing balance comment: Consistent posterior LOB; pt requires cueing to bring weight forward over BOS.                           ADL either performed or assessed with clinical judgement   ADL Overall ADL's : Needs assistance/impaired Eating/Feeding: Sitting;Minimal assistance Eating/Feeding Details (indicate cue type and reason): Min A for mgt of two handed items Grooming: Set up;Supervision/safety;Sitting   Upper Body Bathing: Moderate assistance;Sitting   Lower Body Bathing: Sitting/lateral leans;Moderate assistance   Upper Body Dressing : Sitting;Moderate assistance   Lower Body Dressing: Moderate assistance;+2 for physical assistance;Sit to/from stand   Toilet Transfer: +2 for physical assistance;Maximal assistance;BSC;Stand-pivot Toilet Transfer Details (indicate cue type and reason): Pt with significant weakness in BLE upon standing. Requires +2 assist to maintain standing balance/perform SPT during session. Toileting- Clothing Manipulation and Hygiene: Maximal assistance;+2 for physical assistance;+2 for safety/equipment       Functional mobility during ADLs: +2 for physical assistance;Moderate assistance;Maximal assistance General ADL Comments: Pt significantly functionally limited by BLE weakness, and RUE immobilization.     Vision Baseline Vision/History: Wears glasses Wears Glasses: At all times Patient Visual Report: No change from baseline       Perception     Praxis      Pertinent Vitals/Pain Pain Assessment: No/denies pain     Hand Dominance Right   Extremity/Trunk Assessment Upper Extremity Assessment Upper Extremity Assessment: Generalized weakness;RUE deficits/detail (Full AROM against gravity. No focal weakness appreciated, no sensory deficits per pt report.) RUE Deficits / Details: RUE remains splinted, immobilized,  and treated as NWB t/o session. Pt is able to wiggle fingers. Denies pain at this time. RUE: Unable to fully assess due to immobilization   Lower Extremity Assessment Lower Extremity Assessment: Generalized weakness;Defer to PT evaluation   Cervical / Trunk Assessment Cervical / Trunk Assessment: Normal   Communication Communication Communication: No difficulties   Cognition Arousal/Alertness: Awake/alert Behavior During Therapy: WFL for tasks assessed/performed;Flat affect Overall Cognitive Status: Within Functional Limits for tasks assessed                                 General Comments: generally flat affect; inconsistent response time with conversation and functional tasks (seems to deteriorate with prolonged upright positioning)   General Comments  BP monitored t/o session. Pt with consistent orthostasis during session with moderate dizzines once up in chair. See note.    Exercises Other Exercises Other Exercises: OT facilitates pt education on role of OT in acute setting, falls prevention for home and hospital, as well as mobility with PT as co-treat. Sit/stand x3 with bilat HHA, mod assist +2 for lift off, balance, postural extension/stabilization and overall safety.  Generally poor standing balance reactions; fatigues quickly. Hands-on assist required at all times due to high fall risk. Other Exercises: Increased time to monitor vitals for orthostatics during session. See flowsheets.   Shoulder Instructions      Home Living Family/patient expects to be discharged to:: Private residence Living Arrangements: Children Available Help at Discharge: Family;Available PRN/intermittently Type of Home: House Home Access: Stairs to enter [employer] of Steps: 4 Entrance Stairs-Rails: Right Home Layout: One level     Bathroom Shower/Tub: Teacher, early years/pre: Standard  Home Equipment: [place] - 2 wheels;Bedside commode           Prior Functioning/Environment Level of Independence: Independent        Comments: Indep without assist device for ADLs, mobility and community activities; Active, volunteers with hospital.  Does endorse multiple fall history, worsened in recent weeks        OT Problem List: Decreased strength;Decreased coordination;Cardiopulmonary status limiting activity;Decreased activity tolerance;Decreased safety awareness;Impaired balance (sitting and/or standing);Decreased knowledge of use of DME or AE;Impaired UE functional use      OT Treatment/Interventions: Self-care/ADL training;Therapeutic exercise;Therapeutic activities;DME and/or AE instruction;Patient/family education;Balance training;Energy conservation;Neuromuscular education;Cognitive remediation/compensation;Manual therapy    OT Goals(Current goals can be found in the care plan section) Acute Rehab OT Goals Patient Stated Goal: to get my strength back OT Goal Formulation: With patient Time For Goal Achievement: 09/13/20 ADL Goals Pt Will Perform Grooming: with modified independence;sitting Pt Will Perform Lower Body Dressing: sit to/from stand;with min assist;with adaptive equipment Pt Will Transfer to Toilet: bedside commode;stand pivot transfer;with min assist Pt Will Perform Toileting - Clothing Manipulation and hygiene: sitting/lateral leans;with supervision;with set-up;with adaptive equipment  OT Frequency: Min 2X/week   Barriers to D/C: Decreased caregiver support;Inaccessible home environment          Co-evaluation PT/OT/SLP Co-Evaluation/Treatment: Yes Reason for Co-Treatment: Complexity of the patient's impairments (multi-system involvement);For patient/therapist safety;To address functional/ADL transfers PT goals addressed during session: Mobility/safety with mobility OT goals addressed during session: ADL's and self-care;Proper use of Adaptive equipment and DME      AM-PAC OT "6 Clicks" Daily Activity     Outcome  Measure Help from another person eating meals?: A Little Help from another person taking care of personal grooming?: A Little Help from another person toileting, which includes using toliet, bedpan, or urinal?: A Lot Help from another person bathing (including washing, rinsing, drying)?: A Lot Help from another person to put on and taking off regular upper body clothing?: A Little Help from another person to put on and taking off regular lower body clothing?: A Lot 6 Click Score: 15   End of Session Equipment Utilized During Treatment: Gait belt  Activity Tolerance: Patient tolerated treatment well;No increased pain;Treatment limited secondary to medical complications (Comment) (Orthostatic) Patient left:    OT Visit Diagnosis: Other abnormalities of gait and mobility (R26.89);Other symptoms and signs involving the nervous system (R29.898)                Time: 0947-0962 OT Time Calculation (min): 60 min Charges:  OT General Charges $OT Visit: 1 Visit OT Evaluation $OT Eval Moderate Complexity: 1 Mod OT Treatments $Self Care/Home Management : 23-37 mins  Shara Blazing, M.S., OTR/L Ascom: [phone] 08/30/20, 2:43 PM

## 2020-08-30 NOTE — TOC Initial Note (Signed)
Transition of Care [facility]) - Initial/Assessment Note    Patient Details  Name: Wanda Ochoa MRN: [id number] Date of Birth: 1945-07-03  Transition of Care [facility]) CM/SW Contact:    Shelbie Hutching, RN Phone Number: 08/30/2020, 4:36 PM  Clinical Narrative:                 Patient placed under observation for weakness and falling at home, now has been changed to inpatient status.  RNCM met with patient and patient's son, Wanda Ochoa, at the bedside.  Patient reports that she is from home where her son, [name], lives with her.  Patient has been since March, per son, getting progressively worse with mobility and with her depression.  Patient sees Dr. Nicolasa Ducking for psychiatry outpatient and has been getting ECT outpatient.  Patient feels that some of her mobility issues are coming from the ECT.  Psychiatry has been consulted here at the hospital.   Wanda Ochoa would like for the patient to go for some rehabilitation therapy, he and the patient want to start there and see how she does and then maybe consider long term care, SNF or ALF.   PT is currently recommending CIR.   Wanda Ochoa would like for the patient to be placed in [place] where he lives but understands if that is not possible at this time.  Patient and son are both okay with placement in [place].  RNCM will follow up with CIR tomorrow to see if they think patient is a good candidate for them.   Expected Discharge Plan: [place] Barriers to Discharge: Continued Medical Work up   Patient Goals and CMS Choice Patient states their goals for this hospitalization and ongoing recovery are:: Patinet's son Wanda Ochoa would like for patient to get rehabilitation and would really like for her to be closer to him in [email or web address] Compare Post Acute Care list provided to:: Patient Represenative (must comment) Choice offered to / list presented to : Adult Children  Expected Discharge Plan and Services Expected Discharge Plan: [place]   Discharge Planning Services: CM Consult Post Acute Care Choice: Skilled Nursing Facility,IP Rehab Living arrangements for the past 2 months: Single Family Home                 DME Arranged: N/A DME Agency: NA       HH Arranged: NA          Prior Living Arrangements/Services Living arrangements for the past 2 months: Single Family Home Lives with:: Adult Children Patient language and need for interpreter reviewed:: Yes Do you feel safe going back to the place where you live?: Yes      Need for Family Participation in Patient Care: Yes (Comment) (weakness, mental illness) Care giver support system in place?: Yes (comment) (sons) Current home services: DME (walker, [facility]) Criminal Activity/Legal Involvement Pertinent to Current Situation/Hospitalization: No - Comment as needed  Activities of Daily Living Home Assistive Devices/Equipment: Engineer, drilling (specify type) ADL Screening (condition at time of admission) [name] cognitive ability adequate to safely complete daily activities?: Yes Is the patient deaf or have difficulty hearing?: No Does the patient have difficulty seeing, even when wearing glasses/contacts?: No Does the patient have difficulty concentrating, remembering, or making decisions?: No Patient able to express need for assistance with ADLs?: Yes Does the patient have difficulty dressing or bathing?: Yes Independently performs ADLs?: No Communication: Independent Dressing (OT): Needs assistance Grooming: Independent Feeding: Independent Bathing: Needs assistance Toileting: Needs assistance In/Out Bed: Needs  assistance Walks in Home: Needs assistance Does the patient have difficulty walking or climbing stairs?: Yes Weakness of Legs: Both Weakness of Arms/Hands: None  Permission Sought/Granted Permission sought to share information with : Case Manager,Family Supports,Other (comment) Permission granted to share information with : Yes, Verbal  Permission Granted  Share Information with NAME: Wanda Ochoa and [place] granted to share info w AGENCY: SNFs, inpatient rehab  Permission granted to share info w Relationship: sons     Emotional Assessment Appearance:: Appears stated age Attitude/Demeanor/Rapport: Engaged Affect (typically observed): Accepting Orientation: : Oriented to Self,Oriented to Place,Oriented to Situation,Oriented to  Time Alcohol / Substance Use: Not Applicable Psych Involvement: Yes (comment),Outpatient Provider  Admission diagnosis:  Weakness [R53.1] Generalized weakness [R53.1] Multiple falls [R29.6] Frequent falls [R29.6] Patient Active Problem List   Diagnosis Date Noted  . Frequent falls 08/30/2020  . Weakness 08/29/2020  . Complicated grieving 23/53/6144  . Drug reaction, subsequent encounter 09/23/2019  . Rash and nonspecific skin eruption 09/23/2019  . Stable angina pectoris ([place]) 09/10/2019  . Stage 3a chronic kidney disease ([place]) 09/10/2019  . Idiopathic gout involving toe of right foot 12/07/2018  . Primary osteoarthritis of right knee 03/07/2017  . Hematuria 09/11/2016  . Venous insufficiency of both lower extremities 09/04/2016  . Severe recurrent major depression without psychotic features ([place])   . Hypercholesteremia 05/02/2015  . Mitral valve prolapse 02/21/2015  . Allergic rhinitis 12/24/2014  . Osteopenia 12/24/2014  . Hypothyroidism 11/24/2014  . Generalized anxiety disorder 02/24/2012  . Nocturnal hypoxemia 01/20/2012  . Abdominal mass 01/20/2012  . Temporary cerebral vascular dysfunction 01/02/2009  . Benign hypertension 03/28/2008  . Asthma, exogenous 10/22/2007  . Adaptive colitis 03/12/2003   PCP:  Virginia Crews, MD Pharmacy:   [facility] [phone], [place] [address] Phone: [phone] Fax: [phone]  [id number] [place] [address] [place] [id number]: [phone]: [phone]    Social Determinants of Health (SDOH) Interventions    Readmission Risk Interventions No flowsheet data found.

## 2020-08-30 NOTE — Evaluation (Signed)
Physical Therapy Evaluation Patient Details Name: Wanda Ochoa MRN: [id number] DOB: Sep 01, 1945 Today's Date: 08/30/2020   History of Present Illness  presented to ER secondary to bilat LE weakness, inability to ambulate, frequent falls; admitted for TIA/CVA work up versus management of complicated grieving process. Of note, has been actively receiving ECT treatments (4 total with this episode), most recent session approx 7-10 days prior to this admission.  Clinical Impression  Patient supine in bed upon arrival to room; alert and oriented to basic information.  Follows simple commands and participates with session, through processing and response time appear to deteriorate with upright positioning (consistent with orthostasis).  Patient generally weak and deconditioned throughout all extremities, demonstrating strength grossly at least 4-/5 throughout; R wrist splinted, immobilized and NWB throughout evaluation.  Currently requiring cga/close sup for bed mobility; mod assist +2 for sit/stand, basic transfers and gait (5') with bilat HHA.  Demonstrates generally staggered/ataxic stepping pattern; difficulty unweighting/advancing LEs despite manual weight shift from therapist. Poor balance, all planes; requiring +2 at all times.  Mod reports of dizziness once in chair (BP 90/47, HR 59); improved/resolved with seated positioning.  Additional standing/gait deferred as result Orthostatic assessment significant for notable drop from sitting to standing position (see vitals flowsheet for details); however,  Unable to maintain standing for full standing assessment.  RN informed/aware. Would benefit from skilled PT to address above deficits and promote optimal return to PLOF.; recommend transition to acute inpatient rehab upon discharge for high-intensity, post-acute rehab services to optimize functional recovery.       Follow Up Recommendations CIR    Equipment Recommendations       Recommendations  for Other Services       Precautions / Restrictions Precautions Precautions: Fall Precaution Comments: orthostatic hypotension Restrictions Weight Bearing Restrictions: No      Mobility  Bed Mobility Overal bed mobility: Needs Assistance Bed Mobility: Supine to Sit     Supine to sit: Min guard;Supervision     General bed mobility comments: increased time/effort to complete    Transfers Overall transfer level: Needs assistance Equipment used: 2 person hand held assist Transfers: Sit to/from Stand Sit to Stand: Mod assist;+2 physical assistance         General transfer comment: assist for lift off, anterior weight translation (tendancy towards posterior LOB) and overall stabilization of bilat hips/knees.  Cuing for isometric activation with all standing activities; is able to initiate, but fatigues quickly.  Inconsistent protective righting reactions with posterior weight shift, unsafe to attempt/complete without constant hands-on assist  Ambulation/Gait Ambulation/Gait assistance: Mod assist;+2 physical assistance Gait Distance (Feet): 5 Feet Assistive device: 2 person hand held assist       General Gait Details: generally staggered/ataxic stepping pattern; difficulty unweighting/advancing LEs despite manual weight shift from therapist. Poor balance, all planes; requiring +2 at all times.  Mod reports of dizziness once in chair (BP 90/47, HR 59); improved/resolved with seated positioning.  Additional standing/gait deferred as result  Stairs            Wheelchair Mobility    Modified Rankin (Stroke Patients Only)       Balance Overall balance assessment: Needs assistance Sitting-balance support: No upper extremity supported;Feet supported Sitting balance-Leahy Scale: Good     Standing balance support: Bilateral upper extremity supported Standing balance-Leahy Scale: Poor  Pertinent Vitals/Pain Pain Assessment:  No/denies pain    Home Living Family/patient expects to be discharged to:: Private residence Living Arrangements: Children Available Help at Discharge: Family;Available PRN/intermittently Type of Home: House Home Access: Stairs to enter Entrance Stairs-Rails: Right Entrance Stairs-Number of Steps: 4 Home Layout: One level Home Equipment: Walker - 2 wheels;Bedside commode      Prior Function Level of Independence: Independent         Comments: Indep without assist device for ADLs, mobility and community activities; Active, volunteers with hospital.  Does endorse multiple fall history, worsened in recent weeks     Hand Dominance   Dominant Hand: Right    Extremity/Trunk Assessment   Upper Extremity Assessment Upper Extremity Assessment: Generalized weakness (grossly at least 4-/5 throughout, except R wrist splinted (immobilized and NWB throughout session); does wiggle fingers on R hand, generally edematous)    Lower Extremity Assessment Lower Extremity Assessment: Generalized weakness (grossly at least 4-/5 throughout; no focal weakness with isolated testing)       Communication   Communication: No difficulties  Cognition Arousal/Alertness: Awake/alert Behavior During Therapy: WFL for tasks assessed/performed;Flat affect Overall Cognitive Status: Within Functional Limits for tasks assessed                                 General Comments: generally flat affect; inconsistent response time with conversation and functional tasks (seems to deteriorate with prolonged upright positioning)      General Comments      Exercises Other Exercises Other Exercises: Sit/stand x3 with bilat HHA, mod assist +2 for lift off, balance, postural extension/stabilization and overall safety.  Generally poor standing balance reactions; fatigues quickly. Hands-on assist required at all times due to high fall risk. Other Exercises: Orthostatic assessment (see vitals flowsheet  for details).  Notable drop from sitting to standing, though unable to tolerate standing position for full standing measurement.   Assessment/Plan    PT Assessment Patient needs continued PT services  PT Problem List Decreased strength;Decreased range of motion;Decreased activity tolerance;Decreased balance;Decreased mobility;Decreased coordination;Decreased knowledge of use of DME;Decreased safety awareness;Decreased knowledge of precautions       PT Treatment Interventions DME instruction;Gait training;Stair training;Functional mobility training;Therapeutic activities;Therapeutic exercise;Balance training;Patient/family education;Neuromuscular re-education;Cognitive remediation    PT Goals (Current goals can be found in the Care Plan section)  Acute Rehab PT Goals Patient Stated Goal: to get my strength back PT Goal Formulation: With patient Time For Goal Achievement: 09/13/20 Potential to Achieve Goals: Good    Frequency Min 2X/week   Barriers to discharge Decreased caregiver support      Co-evaluation PT/OT/SLP Co-Evaluation/Treatment: Yes Reason for Co-Treatment: Complexity of the patient's impairments (multi-system involvement);For patient/therapist safety;To address functional/ADL transfers PT goals addressed during session: Mobility/safety with mobility         AM-PAC PT "6 Clicks" Mobility  Outcome Measure Help needed turning from your back to your side while in a flat bed without using bedrails?: A Little Help needed moving from lying on your back to sitting on the side of a flat bed without using bedrails?: A Little Help needed moving to and from a bed to a chair (including a wheelchair)?: A Lot Help needed standing up from a chair using your arms (e.g., wheelchair or bedside chair)?: A Lot Help needed to walk in hospital room?: A Lot Help needed climbing 3-5 steps with a railing? : Total 6 Click Score: 13    End  of Session Equipment Utilized During Treatment:  Gait belt Activity Tolerance: Patient tolerated treatment well Patient left: in chair;with call bell/phone within reach;with family/visitor present (wireless pad not available on unit; family present in room.  Patient aware of need to call for assist with transfer attempts.) Nurse Communication: Mobility status PT Visit Diagnosis: Muscle weakness (generalized) (M62.81);Difficulty in walking, not elsewhere classified (R26.2);Repeated falls (R29.6)    Time: 6222-9798 PT Time Calculation (min) (ACUTE ONLY): 28 min   Charges:   PT Evaluation $PT Eval Moderate Complexity: 1 Mod PT Treatments $Therapeutic Activity: 8-22 mins        Jeremi Losito H. Owens Shark, PT, DPT, NCS 08/30/20, 2:09 PM [phone]

## 2020-08-30 NOTE — Progress Notes (Addendum)
Progress Note    SUBRENA [name]  JGG:[id number] DOB: 1945/07/02  DOA: 08/29/2020 PCP: Virginia Crews, MD      Brief Narrative:    Medical records reviewed and are as summarized below:  Wanda Ochoa is a 75 y.o. female       Assessment/Plan:   Principal Problem:   Weakness Active Problems:   Generalized anxiety disorder   Hypothyroidism   Benign hypertension   Hypercholesteremia   Severe recurrent major depression without psychotic features (HCC)   Primary osteoarthritis of right knee   Idiopathic gout involving toe of right foot   Stage 3a chronic kidney disease ([place])   Complicated grieving   Frequent falls   Body mass index is 25.7 kg/m.    Generalized weakness, recurrent falls: No acute abnormality on MRI brain and CT head.  PT and OT evaluation.  Fall precautions.  Orthostatic vital signs are as follows: Lying down 104/45, sitting up 103/55 standing up to 88/49.  Mild impaction fracture involving posterior head of the third metacarpal bone: Consulted orthopedic surgeon, Dr. Posey Pronto, for further evaluation.  History of major depression/catatonic depression with history of ECT, anxiety, complicated grieving: Consulted psychiatrist, Dr. Weber Cooks, for further evaluation.  Continue psychotropics.  Hypothyroidism: TSH on 08/29/2020 45.7.  TSH on 06/30/2020 was 18.14.  Increase Synthroid from 112 mcg to 150 mg daily.  Other comorbidities include hypertension, hyperlipidemia, CKD stage IIIb, anemia of chronic disease     Diet Order            Diet Heart Room service appropriate? Yes; Fluid consistency: Thin  Diet effective now                    Consultants:  Psychiatrist  Orthopedic surgeon  Procedures:  Splint applied to right forearm in the emergency room on 08/29/2020    Medications:   .  stroke: mapping our early stages of recovery book   Does not apply Once  . aspirin  81 mg Oral Daily  . citalopram  10 mg Oral Daily  .  enoxaparin (LOVENOX) injection  40 mg Subcutaneous Q24H  . labetalol  200 mg Oral BID  . [START ON 08/31/2020] levothyroxine  150 mcg Oral Q0600  . nortriptyline  100 mg Oral QHS  . pravastatin  20 mg Oral QHS  . QUEtiapine  100 mg Oral QHS   Continuous Infusions:   Anti-infectives (From admission, onward)   None             Family Communication/Anticipated D/C date and plan/Code Status   DVT prophylaxis: enoxaparin (LOVENOX) injection 40 mg Start: 08/29/20 2200 SCD's Start: 08/29/20 1611     Code Status: Full Code  Family Communication: None Disposition Plan:    Status is: Observation  The patient will require care spanning > 2 midnights and should be moved to inpatient because: Inpatient level of care appropriate due to severity of illness  Dispo: The patient is from: Home              Anticipated d/c is to: Home              Patient currently is not medically stable to d/c.   Difficult to place patient No           Subjective:   Interval events noted.  She has no complaints at this time.  She is worried about recurrent falls at home.  She said she has history of depression  and her depression manifests as follows.  She said she lost her husband in March 2022.  OT was at the bedside.  Objective:    Vitals:   08/30/20 0500 08/30/20 0850 08/30/20 1200 08/30/20 1533  BP: (!) 152/68 (!) 169/80 (!) 158/82 (!) 159/73  Pulse: 60 62 65 (!) 58  Resp: 15 16 17 17   Temp: 98.4 F (36.9 C) 98.1 F (36.7 C) 98.5 F (36.9 C) 98.3 F (36.8 C)  TempSrc: Oral  Oral   SpO2: 93% 96% 97% 99%  Weight:      Height:       Orthostatic VS for the past 24 hrs:  BP- Lying Pulse- Lying BP- Sitting Pulse- Sitting BP- Standing at 0 minutes Pulse- Standing at 0 minutes  08/30/20 1341 104/45 59 103/55 69 (!) 88/49 56     Intake/Output Summary (Last 24 hours) at 08/30/2020 1534 Last data filed at 08/30/2020 1417 Gross per 24 hour  Intake 60 ml  Output 700 ml  Net -640 ml    Filed Weights   08/29/20 1401  Weight: 78.9 kg    Exam:  GEN: NAD SKIN: Warm and dry EYES: No pallor or icterus ENT: MMM CV: RRR PULM: CTA B ABD: soft, ND, NT, +BS CNS: AAO x 3, non focal EXT: Splint on right forearm. PSYCH: Calm and cooperative        Data Reviewed:   I have personally reviewed following labs and imaging studies:  Labs: Labs show the following:   Basic Metabolic Panel: Recent Labs  Lab 08/28/20 1302 08/29/20 1454  NA 134* 131*  K 5.1 4.3  CL 100 100  CO2 26 25  GLUCOSE 97 102*  BUN 24* 17  CREATININE 1.73* 1.20*  CALCIUM 8.9 8.9   GFR Estimated Creatinine Clearance: 43 mL/min (A) (by C-G formula based on SCr of 1.2 mg/dL (H)). Liver Function Tests: Recent Labs  Lab 08/29/20 1454  AST 23  ALT 17  ALKPHOS 90  BILITOT 1.0  PROT 7.7  ALBUMIN 4.0   No results for input(s): LIPASE, AMYLASE in the last 168 hours. No results for input(s): AMMONIA in the last 168 hours. Coagulation profile No results for input(s): INR, PROTIME in the last 168 hours.  CBC: Recent Labs  Lab 08/28/20 1302 08/29/20 1454  WBC 6.2 7.6  NEUTROABS 3.5 4.5  HGB 9.2* 9.7*  HCT 27.8* 28.8*  MCV 98.2 98.6  PLT 207 210   Cardiac Enzymes: No results for input(s): CKTOTAL, CKMB, CKMBINDEX, TROPONINI in the last 168 hours. BNP (last 3 results) No results for input(s): PROBNP in the last 8760 hours. CBG: No results for input(s): GLUCAP in the last 168 hours. D-Dimer: No results for input(s): DDIMER in the last 72 hours. Hgb A1c: No results for input(s): HGBA1C in the last 72 hours. Lipid Profile: Recent Labs    08/30/20 0507  CHOL 145  HDL 55  LDLCALC 72  TRIG 88  CHOLHDL 2.6   Thyroid function studies: Recent Labs    08/29/20 1454  TSH 45.707*   Anemia work up: No results for input(s): VITAMINB12, FOLATE, FERRITIN, TIBC, IRON, RETICCTPCT in the last 72 hours. Sepsis Labs: Recent Labs  Lab 08/28/20 1302 08/29/20 1454  WBC 6.2 7.6     Microbiology Recent Results (from the past 240 hour(s))  SARS CORONAVIRUS 2 (TAT 6-24 HRS) Nasopharyngeal Nasopharyngeal Swab     Status: None   Collection Time: 08/29/20  2:54 PM   Specimen: Nasopharyngeal Swab  Result Value  Ref Range Status   SARS Coronavirus 2 NEGATIVE NEGATIVE Final    Comment: (NOTE) SARS-CoV-2 target nucleic acids are NOT DETECTED.  The SARS-CoV-2 RNA is generally detectable in upper and lower respiratory specimens during the acute phase of infection. Negative results do not preclude SARS-CoV-2 infection, do not rule out co-infections with other pathogens, and should not be used as the sole basis for treatment or other patient management decisions. Negative results must be combined with clinical observations, patient history, and epidemiological information. The expected result is Negative.  Fact Sheet for Patients: SugarRoll.be  Fact Sheet for Healthcare Providers: https://www.woods-mathews.com/  This test is not yet approved or cleared by the [place] FDA and  has been authorized for detection and/or diagnosis of SARS-CoV-2 by FDA under an Emergency Use Authorization (EUA). This EUA will remain  in effect (meaning this test can be used) for the duration of the COVID-19 declaration under Se ction 564(b)(1) of the Act, 21 U.S.C. section 360bbb-3(b)(1), unless the authorization is terminated or revoked sooner.  Performed at [facility], [place] [address] [place], [place] [address]     Procedures and diagnostic studies:  DG Chest 2 View  Result Date: 08/29/2020 CLINICAL DATA:  Weakness. EXAM: CHEST - 2 VIEW COMPARISON:  May 19, 2015. FINDINGS: The heart size and mediastinal contours are within normal limits. Both lungs are clear. The visualized skeletal structures are unremarkable. IMPRESSION: No active cardiopulmonary disease. Electronically Signed   By: Marijo Conception M.D.   On: 08/29/2020  14:54   CT Head Wo Contrast  Result Date: 08/29/2020 CLINICAL DATA:  Lower extremity weakness question stroke, knees collapsed, fall, falling a lot lately, history hypertension EXAM: CT HEAD WITHOUT CONTRAST TECHNIQUE: Contiguous axial images were obtained from the base of the skull through the vertex without intravenous contrast. Sagittal and coronal MPR images reconstructed from axial data set. COMPARISON:  08/28/2020 FINDINGS: Brain: Generalized atrophy. Normal ventricular morphology. No midline shift or mass effect. Small vessel chronic ischemic changes of deep cerebral white matter. No intracranial hemorrhage, mass lesion, evidence of acute infarction, or extra-axial fluid collection. Vascular: Mild atherosclerotic calcification of internal carotid arteries at skull base. Skull: Intact Sinuses/Orbits: Clear Other: N/A IMPRESSION: Atrophy with small vessel chronic ischemic changes of deep cerebral white matter. No acute intracranial abnormalities. Electronically Signed   By: Lavonia Dana M.D.   On: 08/29/2020 15:05   MR BRAIN WO CONTRAST  Result Date: 08/29/2020 CLINICAL DATA:  Lower extremity weakness EXAM: MRI HEAD WITHOUT CONTRAST TECHNIQUE: Multiplanar, multiecho pulse sequences of the brain and surrounding structures were obtained without intravenous contrast. COMPARISON:  Correlation made with recent CT imaging. Prior MRI is from 20/10 FINDINGS: Brain: There is no acute infarction or intracranial hemorrhage. There is no intracranial mass, mass effect, or edema. There is no hydrocephalus or extra-axial fluid collection. Prominence of the ventricles and sulci reflects generalized parenchymal volume loss. Patchy T2 hyperintensity in the supratentorial white matter is nonspecific but may reflect mild chronic microvascular ischemic changes. Focus of susceptibility hypointensity in the right subinsular white matter likely reflects chronic microhemorrhage. Vascular: Major vessel flow voids at the skull base  are preserved. Skull and upper cervical spine: Normal marrow signal is preserved. Sinuses/Orbits: Trace paranasal sinus mucosal thickening. Orbits are unremarkable. Other: Sella is unremarkable.  Mastoid air cells are clear. IMPRESSION: No acute infarction, hemorrhage, or mass. Mild chronic microvascular ischemic changes. Electronically Signed   By: Macy Mis M.D.   On: 08/29/2020 18:45   DG  Hand 2 View Right  Result Date: 08/29/2020 CLINICAL DATA:  Status post fall. EXAM: RIGHT HAND - 2 VIEW COMPARISON:  None FINDINGS: There is marked dorsal soft tissue swelling. Cortical irregularity is identified along the posterior head of the second metacarpal bone concerning for mild impaction fracture. No dislocation. IMPRESSION: 1. Suspect mild impaction fracture involving the posterior head of the second metacarpal bone. Correlation with exact site of tenderness advised. 2. Marked dorsal soft tissue swelling. Electronically Signed   By: Kerby Moors M.D.   On: 08/29/2020 14:57   DG Hand Complete Right  Result Date: 08/30/2020 CLINICAL DATA:  Right hand pain. EXAM: RIGHT HAND - COMPLETE 3+ VIEW COMPARISON:  August 29, 2020. FINDINGS: The right hand has been splinted. The abnormality involving the posterior portion of the distal head of the second metacarpal noted on prior exam is not well visualized on this radiograph. Dorsal soft tissue swelling is noted. No other definite bony abnormality is noted. IMPRESSION: Right hand has been splinted. Dorsal soft tissue swelling is noted. Abnormality involving posterior portion of distal head of second metacarpal noted on prior exam is not well visualized on this radiograph. Electronically Signed   By: Marijo Conception M.D.   On: 08/30/2020 14:14               LOS: 0 days   Deashia Soule  [facility]   Pager on [email or web address]. If 7PM-7AM, please contact night-coverage at [email or web address]     08/30/2020, 3:34 PM

## 2020-08-31 DIAGNOSIS — E538 Deficiency of other specified B group vitamins: Secondary | ICD-10-CM | POA: Diagnosis present

## 2020-08-31 MED ORDER — CYANOCOBALAMIN 1000 MCG/ML IJ SOLN
1000.0000 ug | Freq: Every day | INTRAMUSCULAR | Status: DC
  Administered 2020-08-31 – 2020-09-04 (×5): 1000 ug via INTRAMUSCULAR
  Filled 2020-08-31 (×5): qty 1

## 2020-08-31 NOTE — Progress Notes (Signed)
Inpatient Rehabilitation Admissions Coordinator   Case discussed and reviewed by Dr Naaman Plummer, medical director for CIR. There is no medical neccesity for an inpatient acute hospital admission. Recommend SNF rehab at this time. I have contacted TOC, Jeanna , acute team and TOC.   Danne Baxter, RN, MSN Rehab Admissions Coordinator [phone] 08/31/2020 12:45 PM

## 2020-08-31 NOTE — NC FL2 (Signed)
Helper LEVEL OF CARE SCREENING TOOL     IDENTIFICATION  Patient Name: Wanda Ochoa Birthdate: 02/02/1946 Sex: female Admission Date (Current Location): 08/29/2020  [facility] and [place] Number:  Engineering geologist and Address:  [facility], [address], [place], [place] [address]      Provider Number: [id number]  Attending Physician Name and Address:  Jennye Boroughs, MD  Relative Name and Phone Number:  Tamberlyn Midgley (son) [phone]    Current Level of Care: Hospital Recommended Level of Care: [place] Prior Approval Number:    Date Approved/Denied:   PASRR Number: pending  Discharge Plan: SNF    Current Diagnoses: Patient Active Problem List   Diagnosis Date Noted   Frequent falls 08/30/2020   Weakness 89/38/1017   Complicated grieving 51/04/5850   Drug reaction, subsequent encounter 09/23/2019   Rash and nonspecific skin eruption 09/23/2019   Stable angina pectoris ([place]) 09/10/2019   Stage 3a chronic kidney disease ([place]) 09/10/2019   Idiopathic gout involving toe of right foot 12/07/2018   Primary osteoarthritis of right knee 03/07/2017   Hematuria 09/11/2016   Venous insufficiency of both lower extremities 09/04/2016   Severe recurrent major depression without psychotic features ([place])    Hypercholesteremia 05/02/2015   Mitral valve prolapse 02/21/2015   Allergic rhinitis 12/24/2014   Osteopenia 12/24/2014   Hypothyroidism 11/24/2014   Generalized anxiety disorder 02/24/2012   Nocturnal hypoxemia 01/20/2012   Abdominal mass 01/20/2012   Temporary cerebral vascular dysfunction 01/02/2009   Benign hypertension 03/28/2008   Asthma, exogenous 10/22/2007   Adaptive colitis 03/12/2003    Orientation RESPIRATION BLADDER Height & Weight     Self, Time, Situation, Place  Normal Continent Weight: 78.9 kg Height:  5\' 9"  (175.3 cm)  BEHAVIORAL SYMPTOMS/MOOD NEUROLOGICAL BOWEL NUTRITION STATUS       Continent Diet (Heart Healthy)  AMBULATORY STATUS COMMUNICATION OF NEEDS Skin   Extensive Assist Verbally Normal, Other (Comment) (Right hand splint)                       Personal Care Assistance Level of Assistance  Bathing, Feeding, Dressing Bathing Assistance: Limited assistance Feeding assistance: Limited assistance Dressing Assistance: Limited assistance     Functional Limitations Info             SPECIAL CARE FACTORS FREQUENCY  PT (By licensed PT), OT (By licensed OT)     PT Frequency: 5 times per week OT Frequency: 5 times per week            Contractures Contractures Info: Not present    Additional Factors Info  Code Status, Allergies, Psychotropic Code Status Info: Full Allergies Info: PCN, Lipitor           Current Medications (08/31/2020):  This is the current hospital active medication list Current Facility-Administered Medications  Medication Dose Route Frequency Provider Last Rate Last Admin    stroke: mapping our early stages of recovery book   Does not apply Once Cox, Amy N, DO       acetaminophen (TYLENOL) tablet 650 mg  650 mg Oral Q4H PRN Cox, Amy N, DO       Or   acetaminophen (TYLENOL) 160 MG/5ML solution 650 mg  650 mg Per Tube Q4H PRN Cox, Amy N, DO       Or   acetaminophen (TYLENOL) suppository 650 mg  650 mg Rectal Q4H PRN Cox, Amy N, DO  ALPRAZolam Duanne Moron) tablet 0.25 mg  0.25 mg Oral Daily PRN Cox, Amy N, DO   0.25 mg at 08/30/20 2051   aspirin chewable tablet 81 mg  81 mg Oral Daily Cox, Amy N, DO   81 mg at 08/30/20 6945   citalopram (CELEXA) tablet 20 mg  20 mg Oral Daily Clapacs, Madie Reno, MD       enoxaparin (LOVENOX) injection 40 mg  40 mg Subcutaneous Q24H Cox, Amy N, DO   40 mg at 08/30/20 2051   hydrALAZINE (APRESOLINE) tablet 25 mg  25 mg Oral Q8H PRN Cox, Amy N, DO       hydrOXYzine (VISTARIL) capsule 25 mg  25 mg Oral Daily PRN Cox, Amy N, DO       labetalol (NORMODYNE) tablet 200 mg  200 mg Oral BID Cox, Amy N, DO    200 mg at 08/30/20 2052   levothyroxine (SYNTHROID) tablet 150 mcg  150 mcg Oral Q0600 Jennye Boroughs, MD   150 mcg at 08/31/20 0531   nortriptyline (PAMELOR) capsule 75 mg  75 mg Oral QHS Clapacs, John T, MD   75 mg at 08/30/20 2052   pravastatin (PRAVACHOL) tablet 20 mg  20 mg Oral QHS Cox, Amy N, DO   20 mg at 08/30/20 2051   QUEtiapine (SEROQUEL) tablet 100 mg  100 mg Oral QHS Cox, Amy N, DO   100 mg at 08/30/20 2227     Discharge Medications: Please see discharge summary for a list of discharge medications.  Relevant Imaging Results:  Relevant Lab Results:   Additional Information SS# [id number]  Shelbie Hutching, RN

## 2020-08-31 NOTE — Progress Notes (Addendum)
Progress Note    Wanda Ochoa  KYH:[id number] DOB: 1945-09-17  DOA: 08/29/2020 PCP: Virginia Crews, MD      Brief Narrative:    Medical records reviewed and are as summarized below:  Wanda Ochoa is a 75 y.o. female with medical history significant for catatonic depression, anxiety, history of ECT by psychiatrist, complicated grief, who presented to the hospital because of generalized weakness and recurrent falls.  There was also report of short-term memory impairment.  Her husband passed away in 06-29-20 and and her symptoms started after her husband's death.  She was admitted to the hospital because of generalized weakness and recurrent falls.  She was found to have severe vitamin B12 deficiency and she was started on vitamin B12 injections.  Work-up also revealed mild impaction fracture involving posterior head of the third metacarpal bone.  Orthopedic surgeon recommended splinting for comfort and nonweightbearing on right upper extremity for 1 to 2 weeks.  Psychiatrist was consulted because of history of depression, anxiety and ECT.  He recommended continuation of current psychotropics.  Her TSH was 45.7, (increased from 18.14 on 06/30/2020).  Her Synthroid was increased from 112 mcg daily to 150 mcg daily.    Assessment/Plan:   Principal Problem:   Frequent falls Active Problems:   Generalized anxiety disorder   Hypothyroidism   Benign hypertension   Hypercholesteremia   Severe recurrent major depression without psychotic features (HCC)   Primary osteoarthritis of right knee   Idiopathic gout involving toe of right foot   Stage 3a chronic kidney disease (HCC)   Weakness   Complicated grieving   Vitamin B12 deficiency   Body mass index is 25.7 kg/m.    Generalized weakness, recurrent falls: No acute abnormality on MRI brain and CT head.  PT and OT recommend discharge to CIR.  However, CIR team said there is no medical necessity for inpatient  admission to CIR.  Follow-up with social worker to assist with disposition to SNF.  Fall precautions.  Severe Vitamin B12 deficiency, short-term memory impairments: Vitamin B12 level is 107.  This may be responsible for short-term memory impairment generalized weakness and recurrent falls.  Start vitamin B12 injections.  Mild impaction fracture involving posterior head of the third metacarpal bone: Appreciate input from Dr. Posey Pronto, orthopedic surgeon.  He recommended splinting for comfort and nonweightbearing on right upper extremity for 1 to 2 weeks (from 08/30/2020).  Outpatient follow-up with orthopedic surgeon in 1 to 2 weeks (from 08/30/2020).  History of major depression/catatonic depression with history of ECT, anxiety, complicated grieving: Appreciate input from psychiatrist, Dr. Weber Cooks.  Continue psychotropics.  Hypothyroidism: TSH on 08/29/2020 45.7.  TSH on 06/30/2020 was 18.14.  Continue Synthroid,150 mcg daily (dose increased from 112 mcg).    Hypertension: Continue labetalol.  Add amlodipine for adequate BP control.  Other comorbidities include hypertension, hyperlipidemia, CKD stage IIIb, anemia of chronic disease  Called her 2 sons, while in the patient's room, to discuss plan of care but none of them answered their phone.  Later spoke to [name], son  Diet Order             Diet Heart Room service appropriate? Yes; Fluid consistency: Thin  Diet effective now                      Consultants: Psychiatrist Orthopedic surgeon  Procedures: Splint applied to right forearm in the emergency room on 08/29/2020    Medications:  stroke: mapping our early stages of recovery book   Does not apply Once   aspirin  81 mg Oral Daily   citalopram  20 mg Oral Daily   cyanocobalamin  1,000 mcg Intramuscular Daily   enoxaparin (LOVENOX) injection  40 mg Subcutaneous Q24H   labetalol  200 mg Oral BID   levothyroxine  150 mcg Oral Q0600   nortriptyline  75 mg Oral QHS    pravastatin  20 mg Oral QHS   QUEtiapine  100 mg Oral QHS   Continuous Infusions:   Anti-infectives (From admission, onward)    None              Family Communication/Anticipated D/C date and plan/Code Status   DVT prophylaxis: enoxaparin (LOVENOX) injection 40 mg Start: 08/29/20 2200 SCD's Start: 08/29/20 1611     Code Status: Full Code  Family Communication: [name], son Disposition Plan:   Status is: Inpatient  Remains inpatient appropriate because:Unsafe d/c plan and Inpatient level of care appropriate due to severity of illness  Dispo: The patient is from: Home              Anticipated d/c is to: SNF              Patient currently is not medically stable to d/c.   Difficult to place patient No               Subjective:   Interval events noted.  She complains of difficulty walking.  No dizziness, headache, shortness of breath or chest pain.   Objective:    Vitals:   08/31/20 0050 08/31/20 0356 08/31/20 0819 08/31/20 1136  BP: 140/69 (!) 141/69 139/69 (!) 161/68  Pulse: (!) 59 (!) 57 (!) 57 61  Resp: 18 18 15 15   Temp: 97.9 F (36.6 C) 97.7 F (36.5 C) 98.1 F (36.7 C) 98.3 F (36.8 C)  TempSrc: Oral Oral Oral Oral  SpO2: 94% 96% 94% 96%  Weight:      Height:       No data found.    Intake/Output Summary (Last 24 hours) at 08/31/2020 1348 Last data filed at 08/31/2020 1045 Gross per 24 hour  Intake 417 ml  Output --  Net 417 ml   Filed Weights   08/29/20 1401  Weight: 78.9 kg    Exam:  GEN: NAD SKIN: No rash EYES: EOMI ENT: MMM CV: RRR PULM: CTA B ABD: soft, ND, NT, +BS CNS: AAO x 3, non focal EXT: Splint on right forearm. PSYCH: Normal affect         Data Reviewed:   I have personally reviewed following labs and imaging studies:  Labs: Labs show the following:   Basic Metabolic Panel: Recent Labs  Lab 08/28/20 1302 08/29/20 1454  NA 134* 131*  K 5.1 4.3  CL 100 100  CO2 26 25  GLUCOSE 97 102*   BUN 24* 17  CREATININE 1.73* 1.20*  CALCIUM 8.9 8.9   GFR Estimated Creatinine Clearance: 43 mL/min (A) (by C-G formula based on SCr of 1.2 mg/dL (H)). Liver Function Tests: Recent Labs  Lab 08/29/20 1454  AST 23  ALT 17  ALKPHOS 90  BILITOT 1.0  PROT 7.7  ALBUMIN 4.0   No results for input(s): LIPASE, AMYLASE in the last 168 hours. No results for input(s): AMMONIA in the last 168 hours. Coagulation profile No results for input(s): INR, PROTIME in the last 168 hours.  CBC: Recent Labs  Lab 08/28/20 1302 08/29/20 1454  WBC 6.2 7.6  NEUTROABS 3.5 4.5  HGB 9.2* 9.7*  HCT 27.8* 28.8*  MCV 98.2 98.6  PLT 207 210   Cardiac Enzymes: No results for input(s): CKTOTAL, CKMB, CKMBINDEX, TROPONINI in the last 168 hours. BNP (last 3 results) No results for input(s): PROBNP in the last 8760 hours. CBG: No results for input(s): GLUCAP in the last 168 hours. D-Dimer: No results for input(s): DDIMER in the last 72 hours. Hgb A1c: Recent Labs    08/30/20 0507  HGBA1C 5.2   Lipid Profile: Recent Labs    08/30/20 0507  CHOL 145  HDL 55  LDLCALC 72  TRIG 88  CHOLHDL 2.6   Thyroid function studies: Recent Labs    08/29/20 1454  TSH 45.707*   Anemia work up: Recent Labs    08/30/20 1332  VITAMINB12 107*   Sepsis Labs: Recent Labs  Lab 08/28/20 1302 08/29/20 1454  WBC 6.2 7.6    Microbiology Recent Results (from the past 240 hour(s))  SARS CORONAVIRUS 2 (TAT 6-24 HRS) Nasopharyngeal Nasopharyngeal Swab     Status: None   Collection Time: 08/29/20  2:54 PM   Specimen: Nasopharyngeal Swab  Result Value Ref Range Status   SARS Coronavirus 2 NEGATIVE NEGATIVE Final    Comment: (NOTE) SARS-CoV-2 target nucleic acids are NOT DETECTED.  The SARS-CoV-2 RNA is generally detectable in upper and lower respiratory specimens during the acute phase of infection. Negative results do not preclude SARS-CoV-2 infection, do not rule out co-infections with other  pathogens, and should not be used as the sole basis for treatment or other patient management decisions. Negative results must be combined with clinical observations, patient history, and epidemiological information. The expected result is Negative.  Fact Sheet for Patients: SugarRoll.be  Fact Sheet for Healthcare Providers: https://www.woods-mathews.com/  This test is not yet approved or cleared by the [place] FDA and  has been authorized for detection and/or diagnosis of SARS-CoV-2 by FDA under an Emergency Use Authorization (EUA). This EUA will remain  in effect (meaning this test can be used) for the duration of the COVID-19 declaration under Se ction 564(b)(1) of the Act, 21 U.S.C. section 360bbb-3(b)(1), unless the authorization is terminated or revoked sooner.  Performed at [facility], [place] [address] [place], [place] [address]     Procedures and diagnostic studies:  DG Chest 2 View  Result Date: 08/29/2020 CLINICAL DATA:  Weakness. EXAM: CHEST - 2 VIEW COMPARISON:  May 19, 2015. FINDINGS: The heart size and mediastinal contours are within normal limits. Both lungs are clear. The visualized skeletal structures are unremarkable. IMPRESSION: No active cardiopulmonary disease. Electronically Signed   By: Marijo Conception M.D.   On: 08/29/2020 14:54   CT Head Wo Contrast  Result Date: 08/29/2020 CLINICAL DATA:  Lower extremity weakness question stroke, knees collapsed, fall, falling a lot lately, history hypertension EXAM: CT HEAD WITHOUT CONTRAST TECHNIQUE: Contiguous axial images were obtained from the base of the skull through the vertex without intravenous contrast. Sagittal and coronal MPR images reconstructed from axial data set. COMPARISON:  08/28/2020 FINDINGS: Brain: Generalized atrophy. Normal ventricular morphology. No midline shift or mass effect. Small vessel chronic ischemic changes of deep cerebral white  matter. No intracranial hemorrhage, mass lesion, evidence of acute infarction, or extra-axial fluid collection. Vascular: Mild atherosclerotic calcification of internal carotid arteries at skull base. Skull: Intact Sinuses/Orbits: Clear Other: N/A IMPRESSION: Atrophy with small vessel chronic ischemic changes of deep cerebral white matter. No acute intracranial abnormalities.  Electronically Signed   By: Lavonia Dana M.D.   On: 08/29/2020 15:05   MR BRAIN WO CONTRAST  Result Date: 08/29/2020 CLINICAL DATA:  Lower extremity weakness EXAM: MRI HEAD WITHOUT CONTRAST TECHNIQUE: Multiplanar, multiecho pulse sequences of the brain and surrounding structures were obtained without intravenous contrast. COMPARISON:  Correlation made with recent CT imaging. Prior MRI is from 20/10 FINDINGS: Brain: There is no acute infarction or intracranial hemorrhage. There is no intracranial mass, mass effect, or edema. There is no hydrocephalus or extra-axial fluid collection. Prominence of the ventricles and sulci reflects generalized parenchymal volume loss. Patchy T2 hyperintensity in the supratentorial white matter is nonspecific but may reflect mild chronic microvascular ischemic changes. Focus of susceptibility hypointensity in the right subinsular white matter likely reflects chronic microhemorrhage. Vascular: Major vessel flow voids at the skull base are preserved. Skull and upper cervical spine: Normal marrow signal is preserved. Sinuses/Orbits: Trace paranasal sinus mucosal thickening. Orbits are unremarkable. Other: Sella is unremarkable.  Mastoid air cells are clear. IMPRESSION: No acute infarction, hemorrhage, or mass. Mild chronic microvascular ischemic changes. Electronically Signed   By: Macy Mis M.D.   On: 08/29/2020 18:45   DG Hand 2 View Right  Result Date: 08/29/2020 CLINICAL DATA:  Status post fall. EXAM: RIGHT HAND - 2 VIEW COMPARISON:  None FINDINGS: There is marked dorsal soft tissue swelling. Cortical  irregularity is identified along the posterior head of the second metacarpal bone concerning for mild impaction fracture. No dislocation. IMPRESSION: 1. Suspect mild impaction fracture involving the posterior head of the second metacarpal bone. Correlation with exact site of tenderness advised. 2. Marked dorsal soft tissue swelling. Electronically Signed   By: Kerby Moors M.D.   On: 08/29/2020 14:57   DG Hand Complete Right  Result Date: 08/30/2020 CLINICAL DATA:  Right hand pain. EXAM: RIGHT HAND - COMPLETE 3+ VIEW COMPARISON:  August 29, 2020. FINDINGS: The right hand has been splinted. The abnormality involving the posterior portion of the distal head of the second metacarpal noted on prior exam is not well visualized on this radiograph. Dorsal soft tissue swelling is noted. No other definite bony abnormality is noted. IMPRESSION: Right hand has been splinted. Dorsal soft tissue swelling is noted. Abnormality involving posterior portion of distal head of second metacarpal noted on prior exam is not well visualized on this radiograph. Electronically Signed   By: Marijo Conception M.D.   On: 08/30/2020 14:14                LOS: 1 day   Cassady Stanczak  [facility]   Pager on [email or web address]. If 7PM-7AM, please contact night-coverage at [email or web address]     08/31/2020, 1:48 PM

## 2020-09-01 ENCOUNTER — Inpatient Hospital Stay: Payer: Medicare Other | Admitting: Registered Nurse

## 2020-09-01 ENCOUNTER — Inpatient Hospital Stay: Admission: RE | Admit: 2020-09-01 | Payer: Medicare Other | Source: Ambulatory Visit

## 2020-09-01 MED ORDER — SODIUM CHLORIDE 0.9 % IV SOLN: INTRAVENOUS | Status: DC | PRN

## 2020-09-01 NOTE — Progress Notes (Signed)
Occupational Therapy Treatment Patient Details Name: Wanda Ochoa MRN: [id number] DOB: [birth year] Today's Date: 09/01/2020    History of present illness presented to ER secondary to bilat LE weakness, inability to ambulate, frequent falls; admitted for TIA/CVA work up versus management of complicated grieving process. Of note, has been actively receiving ECT treatments (4 total with this episode), most recent session approx 7-10 days prior to this admission.   OT comments  Pt seen for OT treatment on this date. Upon arrival to room pt awake/alert, seated upright in recliner. Pt agreeable to OT tx session. Therapist facilitates functional tasks as described below. (See ADL section for additional detail). Pt educated on strategies for one handed grooming tasks, and safety with functional transfers. Pt verbalized understanding instruction provided. Pt making good progress toward goals and continues to benefit from skilled OT services to maximize return to PLOF and minimize risk of future falls, injury, caregiver burden, and readmission. Will continue to follow POC. Discharge recommendation remains appropriate.    Follow Up Recommendations  CIR    Equipment Recommendations  Other (comment)    Recommendations for Other Services      Precautions / Restrictions Precautions Precautions: Fall Precaution Comments: orthostatic hypotension Restrictions Weight Bearing Restrictions: Yes RUE Weight Bearing: Non weight bearing       Mobility Bed Mobility Overal bed mobility: Needs Assistance             General bed mobility comments: in recliner before and after    Transfers Overall transfer level: Needs assistance Equipment used: Right platform walker Transfers: Sit to/from Stand Sit to Stand: Mod assist         General transfer comment: cues for hand placements    Balance Overall balance assessment: Needs assistance Sitting-balance support: No upper extremity  supported;Feet supported Sitting balance-Leahy Scale: Good Sitting balance - Comments: Steady static sitting, reaching within BOS.   Standing balance support: Bilateral upper extremity supported Standing balance-Leahy Scale: Poor Standing balance comment: hands on assist at all times to prevent fall                           ADL either performed or assessed with clinical judgement   ADL Overall ADL's : Needs assistance/impaired Eating/Feeding: Sitting;Minimal assistance   Grooming: Standing;Moderate assistance;Cueing for safety;Cueing for sequencing;Cueing for UE precautions;Oral care Grooming Details (indicate cue type and reason): Pt complted standing grooming tasks at room tray raised to counter height this date. She requires MOD A to maintain static standing balance as well as use of platform walker t/o session. She requires increased time to perform functional task left 2/2 her RUE NWB status. Pt educated on one handed compensatory strategies for grooming tasks t/o session.                               General ADL Comments: Pt continues to be significantly functionally limited by BLE weakness, and RUE immobilization now NWB in RUE.     Vision Baseline Vision/History: Wears glasses Wears Glasses: At all times Patient Visual Report: No change from baseline     Perception     Praxis      Cognition Arousal/Alertness: Awake/alert Behavior During Therapy: WFL for tasks assessed/performed Overall Cognitive Status: Within Functional Limits for tasks assessed  Exercises Other Exercises Other Exercises: OT facilitates standing grooming tasks, STS transfers x2, education on compensatory strategies for ADL management while maintaining RUE NWB status, and falls prevention strategies t/o session.   Shoulder Instructions       General Comments      Pertinent Vitals/ Pain       Pain Assessment:  Faces Faces Pain Scale: Hurts a little bit Pain Location: R hand Pain Descriptors / Indicators: Sore;Aching Pain Intervention(s): Limited activity within patient's tolerance;Monitored during session;Repositioned  Home Living                                          Prior Functioning/Environment              Frequency  Min 2X/week        Progress Toward Goals  OT Goals(current goals can now be found in the care plan section)  Progress towards OT goals: Progressing toward goals  Acute Rehab OT Goals Patient Stated Goal: to get my strength back OT Goal Formulation: With patient Time For Goal Achievement: 09/13/20  Plan Discharge plan remains appropriate;Frequency remains appropriate    Co-evaluation                 AM-PAC OT "6 Clicks" Daily Activity     Outcome Measure   Help from another person eating meals?: A Little Help from another person taking care of personal grooming?: A Little Help from another person toileting, which includes using toliet, bedpan, or urinal?: A Lot Help from another person bathing (including washing, rinsing, drying)?: A Lot Help from another person to put on and taking off regular upper body clothing?: A Little Help from another person to put on and taking off regular lower body clothing?: A Lot 6 Click Score: 15    End of Session Equipment Utilized During Treatment: Gait belt  OT Visit Diagnosis: Other abnormalities of gait and mobility (R26.89);Other symptoms and signs involving the nervous system (R29.898)   Activity Tolerance Patient tolerated treatment well;No increased pain   Patient Left in chair;with call bell/phone within reach;with chair alarm set   Nurse Communication          Time: 8592-9244 OT Time Calculation (min): 25 min  Charges: OT General Charges $OT Visit: 1 Visit OT Treatments $Self Care/Home Management : 23-37 mins  Shara Blazing, M.S., OTR/L Ascom:  [phone] 09/01/20, 3:57 PM

## 2020-09-01 NOTE — TOC Progression Note (Signed)
Transition of Care [facility]) - Progression Note    Patient Details  Name: Wanda Ochoa MRN: [id number] Date of Birth: [birth year]  Transition [facility]) CM/SW [place], RN Phone Number: 09/01/2020, 3:01 PM  Clinical Narrative:   Patient originally recommended for CIR, but is not able to go.  SNF workup started and sent. Patient amenable to SNF.    Expected Discharge Plan: [place] Barriers to Discharge: Continued Medical Work up  Expected Discharge Plan and Services Expected Discharge Plan: [place]   Discharge Planning Services: CM Consult Post Acute Care Choice: [place], IP Rehab Living arrangements for the past 2 months: Single Family Home                 DME Arranged: N/A DME Agency: NA       HH Arranged: NA           Social Determinants of Health (SDOH) Interventions    Readmission Risk Interventions No flowsheet data found.

## 2020-09-01 NOTE — Care Management Important Message (Signed)
Important Message  Patient Details  Name: Wanda Ochoa MRN: [id number] Date of Birth: 1945/09/25   Medicare Important Message Given:  N/A - LOS <3 / Initial given by admissions  Initial Medicare IM reviewed with [name], son by S. Geanie Cooley, Patient Access Associate on 08/31/2020 at 10:12am.     Dannette Barbara 09/01/2020, 8:18 AM

## 2020-09-01 NOTE — Progress Notes (Signed)
Physical Therapy Treatment Patient Details Name: Wanda Ochoa MRN: [id number] DOB: 22-Feb-1946 Today's Date: 09/01/2020    History of Present Illness presented to ER secondary to bilat LE weakness, inability to ambulate, frequent falls; admitted for TIA/CVA work up versus management of complicated grieving process. Of note, has been actively receiving ECT treatments (4 total with this episode), most recent session approx 7-10 days prior to this admission.    PT Comments    In chair, ready for session.  Stood with heavy mod a x 1 HHA.  Unsteady in standing.  Weight shifting limited with fall right and left with attempts.  Right platform walker obtained and fitted.  She is able to stand with mod a x 1 and on 2 trials session focused on general awareness of balance/position, marching in place and SLR.  Pt remains very unsteady with cues to stop and re-balance, smaller movements and overall safety.  She is unsafe to progress gait away from chair with +1 assist.  She remains a high fall risk and unsafe to attempt any gait/transfers on her own. If pt does not qualify for CIR, SNF would be necessary.  She lives with autistic son who is unable to safely care for her.   Follow Up Recommendations  CIR     Equipment Recommendations       Recommendations for Other Services       Precautions / Restrictions Precautions Precautions: Fall Precaution Comments: orthostatic hypotension Restrictions Weight Bearing Restrictions: Yes RUE Weight Bearing: Non weight bearing    Mobility  Bed Mobility               General bed mobility comments: in recliner before and after    Transfers Overall transfer level: Needs assistance Equipment used: Right platform walker Transfers: Sit to/from Stand Sit to Stand: Mod assist         General transfer comment: cues for hand placements  Ambulation/Gait                 Stairs             Wheelchair Mobility    Modified Rankin  (Stroke Patients Only)       Balance Overall balance assessment: Needs assistance Sitting-balance support: No upper extremity supported;Feet supported Sitting balance-Leahy Scale: Good     Standing balance support: Bilateral upper extremity supported Standing balance-Leahy Scale: Poor Standing balance comment: hands on assist at all times to prevent fall                            Cognition Arousal/Alertness: Awake/alert Behavior During Therapy: WFL for tasks assessed/performed;Flat affect Overall Cognitive Status: Within Functional Limits for tasks assessed                                        Exercises Other Exercises Other Exercises: session focused on standing balance, awarness, safety and exercises with support of platorm walker.    General Comments        Pertinent Vitals/Pain Pain Assessment: Faces Faces Pain Scale: Hurts little more Pain Location: R hand Pain Descriptors / Indicators: Sore;Aching    Home Living                      Prior Function            PT Goals (current  goals can now be found in the care plan section) Progress towards PT goals: Progressing toward goals    Frequency    Min 2X/week      PT Plan Current plan remains appropriate    Co-evaluation              AM-PAC PT "6 Clicks" Mobility   Outcome Measure  Help needed turning from your back to your side while in a flat bed without using bedrails?: A Little Help needed moving from lying on your back to sitting on the side of a flat bed without using bedrails?: A Little Help needed moving to and from a bed to a chair (including a wheelchair)?: A Lot Help needed standing up from a chair using your arms (e.g., wheelchair or bedside chair)?: A Lot Help needed to walk in hospital room?: A Lot Help needed climbing 3-5 steps with a railing? : Total 6 Click Score: 13    End of Session Equipment Utilized During Treatment: Gait  belt Activity Tolerance: Patient tolerated treatment well Patient left: in chair;with call bell/phone within reach Nurse Communication: Mobility status PT Visit Diagnosis: Muscle weakness (generalized) (M62.81);Difficulty in walking, not elsewhere classified (R26.2);Repeated falls (R29.6)     Time: 6468-0321 PT Time Calculation (min) (ACUTE ONLY): 24 min  Charges:  $Therapeutic Exercise: 8-22 mins $Therapeutic Activity: 8-22 mins                    Chesley Noon, PTA 09/01/20, 2:16 PM , 2:12 PM

## 2020-09-01 NOTE — TOC Progression Note (Signed)
Transition of Care [facility]) - Progression Note    Patient Details  Name: CRISTEL RAIL MRN: [id number] Date of Birth: 1945-07-04  Transition of Care [facility]) CM/SW Aubrey, RN Phone Number: 09/01/2020, 10:45 AM  Clinical Narrative:   Patient unable to go to CIR, bedsearch started for SNF    Expected Discharge Plan: [place] Barriers to Discharge: Continued Medical Work up  Expected Discharge Plan and Services Expected Discharge Plan: [place]   Discharge Planning Services: CM Consult Post Acute Care Choice: [place] arrangements for the past 2 months: Single Family Home                 DME Arranged: N/A DME Agency: NA       HH Arranged: NA           Social Determinants of Health (SDOH) Interventions    Readmission Risk Interventions No flowsheet data found.

## 2020-09-01 NOTE — Progress Notes (Signed)
Progress Note    Wanda Ochoa  ZOX:[id number] DOB: 06/27/1945  DOA: 08/29/2020 PCP: Virginia Crews, MD      Brief Narrative:    Medical records reviewed and are as summarized below:  Wanda Ochoa is a 75 y.o. female with medical history significant for catatonic depression, anxiety, history of ECT by psychiatrist, complicated grief, who presented to the hospital because of generalized weakness and recurrent falls.  There was also report of short-term memory impairment.  Her husband passed away in 07-07-2020 and and her symptoms started after her husband's death.  She was admitted to the hospital because of generalized weakness and recurrent falls.  She was found to have severe vitamin B12 deficiency and she was started on vitamin B12 injections.  Work-up also revealed mild impaction fracture involving posterior head of the third metacarpal bone.  Orthopedic surgeon recommended splinting for comfort and nonweightbearing on right upper extremity for 1 to 2 weeks.  Psychiatrist was consulted because of history of depression, anxiety and ECT.  He recommended continuation of current psychotropics.  Her TSH was 45.7, (increased from 18.14 on 06/30/2020).  Her Synthroid was increased from 112 mcg daily to 150 mcg daily.    Assessment/Plan:   Principal Problem:   Frequent falls Active Problems:   Generalized anxiety disorder   Hypothyroidism   Benign hypertension   Hypercholesteremia   Severe recurrent major depression without psychotic features (HCC)   Primary osteoarthritis of right knee   Idiopathic gout involving toe of right foot   Stage 3a chronic kidney disease (HCC)   Weakness   Complicated grieving   Vitamin B12 deficiency   Body mass index is 25.7 kg/m.    Generalized weakness, recurrent falls: No acute abnormality on MRI brain and CT head.  Follow-up with social worker to assist with disposition to SNF.  Severe Vitamin B12 deficiency, short-term  memory impairments: Vitamin B12 level is 107.  This may be responsible for short-term memory impairment generalized weakness and recurrent falls.  Continue vitamin B12 injections.  Mild impaction fracture involving posterior head of the third metacarpal bone: Appreciate input from Dr. Posey Pronto, orthopedic surgeon.  He recommended splinting for comfort and nonweightbearing on right upper extremity for 1 to 2 weeks (from 08/30/2020).  Outpatient follow-up with orthopedic surgeon in 1 to 2 weeks (from 08/30/2020).  History of major depression/catatonic depression with history of ECT, anxiety, complicated grieving: Appreciate input from psychiatrist, Dr. Weber Cooks.  Continue psychotropics.  Hypothyroidism: TSH on 08/29/2020 45.7.  TSH on 06/30/2020 was 18.14.  Continue Synthroid,150 mcg daily (dose increased from 112 mcg).    Hypertension: Continue antihypertensives.  Other comorbidities include hypertension, hyperlipidemia, CKD stage IIIb, anemia of chronic disease   Diet Order             Diet Heart Room service appropriate? Yes; Fluid consistency: Thin  Diet effective now                      Consultants: Psychiatrist Orthopedic surgeon  Procedures: Splint applied to right forearm in the emergency room on 08/29/2020    Medications:     stroke: mapping our early stages of recovery book   Does not apply Once   aspirin  81 mg Oral Daily   citalopram  20 mg Oral Daily   cyanocobalamin  1,000 mcg Intramuscular Daily   enoxaparin (LOVENOX) injection  40 mg Subcutaneous Q24H   labetalol  200 mg Oral BID  levothyroxine  150 mcg Oral Q0600   nortriptyline  75 mg Oral QHS   pravastatin  20 mg Oral QHS   QUEtiapine  100 mg Oral QHS   Continuous Infusions:   Anti-infectives (From admission, onward)    None              Family Communication/Anticipated D/C date and plan/Code Status   DVT prophylaxis: enoxaparin (LOVENOX) injection 40 mg Start: 08/29/20 2200 SCD's Start:  08/29/20 1611     Code Status: Full Code  Family Communication: None Disposition Plan:   Status is: Inpatient  Remains inpatient appropriate because:Unsafe d/c plan and Inpatient level of care appropriate due to severity of illness  Dispo: The patient is from: Home              Anticipated d/c is to: SNF              Patient currently is not medically stable to d/c.   Difficult to place patient No               Subjective:   Interval events noted.  She still has trouble walking.   Objective:    Vitals:   09/01/20 0024 09/01/20 0442 09/01/20 0500 09/01/20 0816  BP: 134/61 121/63  (!) 171/80  Pulse: 61 (!) 49 62 (!) 59  Resp: 18 18  16   Temp: 97.8 F (36.6 C)   97.7 F (36.5 C)  TempSrc: Oral     SpO2: 96%   97%  Weight:      Height:       No data found.    Intake/Output Summary (Last 24 hours) at 09/01/2020 1113 Last data filed at 08/31/2020 1900 Gross per 24 hour  Intake 240 ml  Output --  Net 240 ml   Filed Weights   08/29/20 1401  Weight: 78.9 kg    Exam:  GEN: NAD SKIN: No rash EYES: EOMI ENT: MMM CV: RRR PULM: CTA B ABD: soft, ND, NT, +BS CNS: AAO x 3, non focal EXT: No edema or tenderness    Data Reviewed:   I have personally reviewed following labs and imaging studies:  Labs: Labs show the following:   Basic Metabolic Panel: Recent Labs  Lab 08/28/20 1302 08/29/20 1454  NA 134* 131*  K 5.1 4.3  CL 100 100  CO2 26 25  GLUCOSE 97 102*  BUN 24* 17  CREATININE 1.73* 1.20*  CALCIUM 8.9 8.9   GFR Estimated Creatinine Clearance: 43 mL/min (A) (by C-G formula based on SCr of 1.2 mg/dL (H)). Liver Function Tests: Recent Labs  Lab 08/29/20 1454  AST 23  ALT 17  ALKPHOS 90  BILITOT 1.0  PROT 7.7  ALBUMIN 4.0   No results for input(s): LIPASE, AMYLASE in the last 168 hours. No results for input(s): AMMONIA in the last 168 hours. Coagulation profile No results for input(s): INR, PROTIME in the last 168  hours.  CBC: Recent Labs  Lab 08/28/20 1302 08/29/20 1454  WBC 6.2 7.6  NEUTROABS 3.5 4.5  HGB 9.2* 9.7*  HCT 27.8* 28.8*  MCV 98.2 98.6  PLT 207 210   Cardiac Enzymes: No results for input(s): CKTOTAL, CKMB, CKMBINDEX, TROPONINI in the last 168 hours. BNP (last 3 results) No results for input(s): PROBNP in the last 8760 hours. CBG: No results for input(s): GLUCAP in the last 168 hours. D-Dimer: No results for input(s): DDIMER in the last 72 hours. Hgb A1c: Recent Labs    08/30/20  0507  HGBA1C 5.2   Lipid Profile: Recent Labs    08/30/20 0507  CHOL 145  HDL 55  LDLCALC 72  TRIG 88  CHOLHDL 2.6   Thyroid function studies: Recent Labs    08/29/20 1454  TSH 45.707*   Anemia work up: Recent Labs    08/30/20 1332  VITAMINB12 107*   Sepsis Labs: Recent Labs  Lab 08/28/20 1302 08/29/20 1454  WBC 6.2 7.6    Microbiology Recent Results (from the past 240 hour(s))  SARS CORONAVIRUS 2 (TAT 6-24 HRS) Nasopharyngeal Nasopharyngeal Swab     Status: None   Collection Time: 08/29/20  2:54 PM   Specimen: Nasopharyngeal Swab  Result Value Ref Range Status   SARS Coronavirus 2 NEGATIVE NEGATIVE Final    Comment: (NOTE) SARS-CoV-2 target nucleic acids are NOT DETECTED.  The SARS-CoV-2 RNA is generally detectable in upper and lower respiratory specimens during the acute phase of infection. Negative results do not preclude SARS-CoV-2 infection, do not rule out co-infections with other pathogens, and should not be used as the sole basis for treatment or other patient management decisions. Negative results must be combined with clinical observations, patient history, and epidemiological information. The expected result is Negative.  Fact Sheet for Patients: SugarRoll.be  Fact Sheet for Healthcare Providers: https://www.woods-mathews.com/  This test is not yet approved or cleared by the [place] FDA and  has been  authorized for detection and/or diagnosis of SARS-CoV-2 by FDA under an Emergency Use Authorization (EUA). This EUA will remain  in effect (meaning this test can be used) for the duration of the COVID-19 declaration under Se ction 564(b)(1) of the Act, 21 U.S.C. section 360bbb-3(b)(1), unless the authorization is terminated or revoked sooner.  Performed at [facility], [place] [address], [place], [place] [address]     Procedures and diagnostic studies:  DG Hand Complete Right  Result Date: 08/30/2020 CLINICAL DATA:  Right hand pain. EXAM: RIGHT HAND - COMPLETE 3+ VIEW COMPARISON:  August 29, 2020. FINDINGS: The right hand has been splinted. The abnormality involving the posterior portion of the distal head of the second metacarpal noted on prior exam is not well visualized on this radiograph. Dorsal soft tissue swelling is noted. No other definite bony abnormality is noted. IMPRESSION: Right hand has been splinted. Dorsal soft tissue swelling is noted. Abnormality involving posterior portion of distal head of second metacarpal noted on prior exam is not well visualized on this radiograph. Electronically Signed   By: Marijo Conception M.D.   On: 08/30/2020 14:14                LOS: 2 days   Camia Dipinto  [facility]   Pager on [email or web address]. If 7PM-7AM, please contact night-coverage at [email or web address]     09/01/2020, 11:13 AM

## 2020-09-02 NOTE — Progress Notes (Signed)
Progress Note    Wanda Ochoa  DJM:[id number] DOB: 1945-12-11  DOA: 08/29/2020 PCP: Virginia Crews, MD      Brief Narrative:    Medical records reviewed and are as summarized below:  Wanda Ochoa is a 75 y.o. female with medical history significant for catatonic depression, anxiety, history of ECT by psychiatrist, complicated grief, who presented to the hospital because of generalized weakness and recurrent falls.  There was also report of short-term memory impairment.  Her husband passed away in June 18, 2020 and and her symptoms started after her husband's death.  She was admitted to the hospital because of generalized weakness and recurrent falls.  She was found to have severe vitamin B12 deficiency and she was started on vitamin B12 injections.  Work-up also revealed mild impaction fracture involving posterior head of the third metacarpal bone.  Orthopedic surgeon recommended splinting for comfort and nonweightbearing on right upper extremity for 1 to 2 weeks.  Psychiatrist was consulted because of history of depression, anxiety and ECT.  He recommended continuation of current psychotropics.  Her TSH was 45.7, (increased from 18.14 on 06/30/2020).  Her Synthroid was increased from 112 mcg daily to 150 mcg daily.    Assessment/Plan:   Principal Problem:   Frequent falls Active Problems:   Generalized anxiety disorder   Hypothyroidism   Benign hypertension   Hypercholesteremia   Severe recurrent major depression without psychotic features (HCC)   Primary osteoarthritis of right knee   Idiopathic gout involving toe of right foot   Stage 3a chronic kidney disease (HCC)   Weakness   Complicated grieving   Vitamin B12 deficiency   Body mass index is 25.7 kg/m.    Generalized weakness, recurrent falls: No acute abnormality on MRI brain and CT head.  Follow-up with social worker to assist with disposition to SNF.  Severe Vitamin B12 deficiency, short-term  memory impairment: Vitamin B12 level is 107.  This may be responsible for short-term memory impairment, generalized weakness and recurrent falls.  Continue daily vitamin B12 injections.  Mild impaction fracture involving posterior head of the third metacarpal bone: Appreciate input from Dr. Posey Pronto, orthopedic surgeon.  He recommended splinting for comfort and nonweightbearing on right upper extremity for 1 to 2 weeks (from 08/30/2020).  Outpatient follow-up with orthopedic surgeon in 1 to 2 weeks (from 08/30/2020).  History of major depression/catatonic depression with history of ECT, anxiety, complicated grieving: She has been evaluated by Dr. Weber Cooks, psychiatrist.  Continue psychotropics.  Hypothyroidism: TSH on 08/29/2020 45.7.  TSH on 06/30/2020 was 18.14.  Continue Synthroid,150 mcg daily (dose increased from 112 mcg).    Hypertension: Continue antihypertensives.  Other comorbidities include hypertension, hyperlipidemia, CKD stage IIIb, anemia of chronic disease  Awaiting placement to SNF.   Diet Order             Diet Heart Room service appropriate? Yes; Fluid consistency: Thin  Diet effective now                      Consultants: Psychiatrist Orthopedic surgeon  Procedures: Splint applied to right forearm in the emergency room on 08/29/2020    Medications:     stroke: mapping our early stages of recovery book   Does not apply Once   aspirin  81 mg Oral Daily   citalopram  20 mg Oral Daily   cyanocobalamin  1,000 mcg Intramuscular Daily   enoxaparin (LOVENOX) injection  40 mg Subcutaneous Q24H  labetalol  200 mg Oral BID   levothyroxine  150 mcg Oral Q0600   nortriptyline  75 mg Oral QHS   pravastatin  20 mg Oral QHS   QUEtiapine  100 mg Oral QHS   Continuous Infusions:   Anti-infectives (From admission, onward)    None              Family Communication/Anticipated D/C date and plan/Code Status   DVT prophylaxis: enoxaparin (LOVENOX) injection 40 mg  Start: 08/29/20 2200 SCD's Start: 08/29/20 1611     Code Status: Full Code  Family Communication: None Disposition Plan:   Status is: Inpatient  Remains inpatient appropriate because:Unsafe d/c plan and Inpatient level of care appropriate due to severity of illness  Dispo: The patient is from: Home              Anticipated d/c is to: SNF              Patient currently is medically stable to d/c.   Difficult to place patient No               Subjective:   No new complaints.  She has difficulty walking.   Objective:    Vitals:   09/02/20 0114 09/02/20 0641 09/02/20 0853 09/02/20 1343  BP: (!) 146/69 (!) 148/65 (!) 160/72 105/60  Pulse: (!) 57 (!) 56 63 (!) 52  Resp: 16 16 18 18   Temp: 98.3 F (36.8 C) (!) 97.5 F (36.4 C) 97.9 F (36.6 C) 97.9 F (36.6 C)  TempSrc: Oral Oral Oral Oral  SpO2: 96% 92% 95% 99%  Weight:      Height:       No data found.    Intake/Output Summary (Last 24 hours) at 09/02/2020 1425 Last data filed at 09/02/2020 1356 Gross per 24 hour  Intake 120 ml  Output --  Net 120 ml   Filed Weights   08/29/20 1401  Weight: 78.9 kg    Exam:  GEN: NAD SKIN: No rash EYES: EOMI ENT: MMM CV: RRR PULM: CTA B ABD: soft, ND, NT, +BS CNS: AAO x 3, non focal EXT: Splint on right forearm     Data Reviewed:   I have personally reviewed following labs and imaging studies:  Labs: Labs show the following:   Basic Metabolic Panel: Recent Labs  Lab 08/28/20 1302 08/29/20 1454  NA 134* 131*  K 5.1 4.3  CL 100 100  CO2 26 25  GLUCOSE 97 102*  BUN 24* 17  CREATININE 1.73* 1.20*  CALCIUM 8.9 8.9   GFR Estimated Creatinine Clearance: 43 mL/min (A) (by C-G formula based on SCr of 1.2 mg/dL (H)). Liver Function Tests: Recent Labs  Lab 08/29/20 1454  AST 23  ALT 17  ALKPHOS 90  BILITOT 1.0  PROT 7.7  ALBUMIN 4.0   No results for input(s): LIPASE, AMYLASE in the last 168 hours. No results for input(s): AMMONIA in  the last 168 hours. Coagulation profile No results for input(s): INR, PROTIME in the last 168 hours.  CBC: Recent Labs  Lab 08/28/20 1302 08/29/20 1454  WBC 6.2 7.6  NEUTROABS 3.5 4.5  HGB 9.2* 9.7*  HCT 27.8* 28.8*  MCV 98.2 98.6  PLT 207 210   Cardiac Enzymes: No results for input(s): CKTOTAL, CKMB, CKMBINDEX, TROPONINI in the last 168 hours. BNP (last 3 results) No results for input(s): PROBNP in the last 8760 hours. CBG: No results for input(s): GLUCAP in the last 168 hours. D-Dimer: No  results for input(s): DDIMER in the last 72 hours. Hgb A1c: No results for input(s): HGBA1C in the last 72 hours.  Lipid Profile: No results for input(s): CHOL, HDL, LDLCALC, TRIG, CHOLHDL, LDLDIRECT in the last 72 hours.  Thyroid function studies: No results for input(s): TSH, T4TOTAL, T3FREE, THYROIDAB in the last 72 hours.  Invalid input(s): FREET3  Anemia work up: No results for input(s): VITAMINB12, FOLATE, FERRITIN, TIBC, IRON, RETICCTPCT in the last 72 hours.  Sepsis Labs: Recent Labs  Lab 08/28/20 1302 08/29/20 1454  WBC 6.2 7.6    Microbiology Recent Results (from the past 240 hour(s))  SARS CORONAVIRUS 2 (TAT 6-24 HRS) Nasopharyngeal Nasopharyngeal Swab     Status: None   Collection Time: 08/29/20  2:54 PM   Specimen: Nasopharyngeal Swab  Result Value Ref Range Status   SARS Coronavirus 2 NEGATIVE NEGATIVE Final    Comment: (NOTE) SARS-CoV-2 target nucleic acids are NOT DETECTED.  The SARS-CoV-2 RNA is generally detectable in upper and lower respiratory specimens during the acute phase of infection. Negative results do not preclude SARS-CoV-2 infection, do not rule out co-infections with other pathogens, and should not be used as the sole basis for treatment or other patient management decisions. Negative results must be combined with clinical observations, patient history, and epidemiological information. The expected result is Negative.  Fact Sheet for  Patients: SugarRoll.be  Fact Sheet for Healthcare Providers: https://www.woods-mathews.com/  This test is not yet approved or cleared by the [place] FDA and  has been authorized for detection and/or diagnosis of SARS-CoV-2 by FDA under an Emergency Use Authorization (EUA). This EUA will remain  in effect (meaning this test can be used) for the duration of the COVID-19 declaration under Se ction 564(b)(1) of the Act, 21 U.S.C. section 360bbb-3(b)(1), unless the authorization is terminated or revoked sooner.  Performed at [facility], [place] [address], [place], [place] [address]     Procedures and diagnostic studies:  No results found.              LOS: 3 days   Edison Wollschlager  [facility]   Pager on [email or web address]. If 7PM-7AM, please contact night-coverage at [email or web address]     09/02/2020, 2:25 PM

## 2020-09-03 NOTE — Progress Notes (Signed)
Progress Note    Wanda Ochoa  DJS:[id number] DOB: [birth year]  DOA: 08/29/2020 PCP: Virginia Crews, MD      Brief Narrative:    Medical records reviewed and are as summarized below:  Wanda Ochoa is a 75 y.o. female with medical history significant for catatonic depression, anxiety, history of ECT by psychiatrist, complicated grief, who presented to the hospital because of generalized weakness and recurrent falls.  There was also report of short-term memory impairment.  Her husband passed away in 2020-07-01 and and her symptoms started after her husband's death.  She was admitted to the hospital because of generalized weakness and recurrent falls.  She was found to have severe vitamin B12 deficiency and she was started on vitamin B12 injections.  Work-up also revealed mild impaction fracture involving posterior head of the third metacarpal bone.  Orthopedic surgeon recommended splinting for comfort and nonweightbearing on right upper extremity for 1 to 2 weeks.  Psychiatrist was consulted because of history of depression, anxiety and ECT.  He recommended continuation of current psychotropics.  Her TSH was 45.7, (increased from 18.14 on 06/30/2020).  Her Synthroid was increased from 112 mcg daily to 150 mcg daily.    Assessment/Plan:   Principal Problem:   Frequent falls Active Problems:   Generalized anxiety disorder   Hypothyroidism   Benign hypertension   Hypercholesteremia   Severe recurrent major depression without psychotic features (HCC)   Primary osteoarthritis of right knee   Idiopathic gout involving toe of right foot   Stage 3a chronic kidney disease (HCC)   Weakness   Complicated grieving   Vitamin B12 deficiency   Body mass index is 25.7 kg/m.    Generalized weakness, recurrent falls: No acute abnormality on MRI brain and CT head.  Follow-up with social worker to assist with disposition to SNF.  Severe Vitamin B12 deficiency, short-term  memory impairment: Vitamin B12 level is 107.  This may be responsible for short-term memory impairment, generalized weakness and recurrent falls.  Continue daily vitamin B12 injections.  Mild impaction fracture involving posterior head of the third metacarpal bone: Appreciate input from Dr. Posey Pronto, orthopedic surgeon.  He recommended splinting for comfort and nonweightbearing on right upper extremity for 1 to 2 weeks (from 08/30/2020).  Outpatient follow-up with orthopedic surgeon in 1 to 2 weeks (from 08/30/2020).  History of major depression/catatonic depression with history of ECT, anxiety, complicated grieving: She has been evaluated by Dr. Weber Cooks, psychiatrist.  Continue psychotropics.  Hypothyroidism: TSH on 08/29/2020 45.7.  TSH on 06/30/2020 was 18.14.  Continue Synthroid,150 mcg daily (dose increased from 112 mcg).    Hypertension: Continue antihypertensives.  Sinus bradycardia: Asymptomatic.  Other comorbidities include hypertension, hyperlipidemia, CKD stage IIIb, anemia of chronic disease  Awaiting placement to SNF.   Diet Order             Diet Heart Room service appropriate? Yes; Fluid consistency: Thin  Diet effective now                      Consultants: Psychiatrist Orthopedic surgeon  Procedures: Splint applied to right forearm in the emergency room on 08/29/2020    Medications:     stroke: mapping our early stages of recovery book   Does not apply Once   aspirin  81 mg Oral Daily   citalopram  20 mg Oral Daily   cyanocobalamin  1,000 mcg Intramuscular Daily   enoxaparin (LOVENOX) injection  40 mg  Subcutaneous Q24H   labetalol  200 mg Oral BID   levothyroxine  150 mcg Oral Q0600   nortriptyline  75 mg Oral QHS   pravastatin  20 mg Oral QHS   QUEtiapine  100 mg Oral QHS   Continuous Infusions:   Anti-infectives (From admission, onward)    None              Family Communication/Anticipated D/C date and plan/Code Status   DVT prophylaxis:  enoxaparin (LOVENOX) injection 40 mg Start: 08/29/20 2200 SCD's Start: 08/29/20 1611     Code Status: Full Code  Family Communication: None Disposition Plan:   Status is: Inpatient  Remains inpatient appropriate because:Unsafe d/c plan and Inpatient level of care appropriate due to severity of illness  Dispo: The patient is from: Home              Anticipated d/c is to: SNF              Patient currently is medically stable to d/c.   Difficult to place patient No               Subjective:   Interval events noted.  She said she has seen some slight improvement in lower extremity weakness.  She still requires assistance to use the commode   Objective:    Vitals:   09/02/20 1614 09/02/20 2159 09/03/20 0601 09/03/20 0720  BP: (!) 156/71 (!) 173/75 (!) 118/54 (!) 141/71  Pulse: (!) 54 62 (!) 53 (!) 55  Resp: 18 16 16 16   Temp: 98.3 F (36.8 C) 97.8 F (36.6 C) 98.1 F (36.7 C) 97.7 F (36.5 C)  TempSrc: Oral Oral Oral Oral  SpO2: 100% 93% 94% 97%  Weight:      Height:       No data found.    Intake/Output Summary (Last 24 hours) at 09/03/2020 1047 Last data filed at 09/03/2020 0900 Gross per 24 hour  Intake 120 ml  Output 0 ml  Net 120 ml   Filed Weights   08/29/20 1401  Weight: 78.9 kg    Exam:  GEN: NAD SKIN: No rash EYES: EOMI ENT: MMM CV: RRR PULM: CTA B ABD: soft, ND, NT, +BS CNS: AAO x 3, non focal EXT: No edema or tenderness.  Splint on right forearm.    Data Reviewed:   I have personally reviewed following labs and imaging studies:  Labs: Labs show the following:   Basic Metabolic Panel: Recent Labs  Lab 08/28/20 1302 08/29/20 1454  NA 134* 131*  K 5.1 4.3  CL 100 100  CO2 26 25  GLUCOSE 97 102*  BUN 24* 17  CREATININE 1.73* 1.20*  CALCIUM 8.9 8.9   GFR Estimated Creatinine Clearance: 43 mL/min (A) (by C-G formula based on SCr of 1.2 mg/dL (H)). Liver Function Tests: Recent Labs  Lab 08/29/20 1454  AST 23   ALT 17  ALKPHOS 90  BILITOT 1.0  PROT 7.7  ALBUMIN 4.0   No results for input(s): LIPASE, AMYLASE in the last 168 hours. No results for input(s): AMMONIA in the last 168 hours. Coagulation profile No results for input(s): INR, PROTIME in the last 168 hours.  CBC: Recent Labs  Lab 08/28/20 1302 08/29/20 1454  WBC 6.2 7.6  NEUTROABS 3.5 4.5  HGB 9.2* 9.7*  HCT 27.8* 28.8*  MCV 98.2 98.6  PLT 207 210   Cardiac Enzymes: No results for input(s): CKTOTAL, CKMB, CKMBINDEX, TROPONINI in the last 168 hours. BNP (  last 3 results) No results for input(s): PROBNP in the last 8760 hours. CBG: No results for input(s): GLUCAP in the last 168 hours. D-Dimer: No results for input(s): DDIMER in the last 72 hours. Hgb A1c: No results for input(s): HGBA1C in the last 72 hours.  Lipid Profile: No results for input(s): CHOL, HDL, LDLCALC, TRIG, CHOLHDL, LDLDIRECT in the last 72 hours.  Thyroid function studies: No results for input(s): TSH, T4TOTAL, T3FREE, THYROIDAB in the last 72 hours.  Invalid input(s): FREET3  Anemia work up: No results for input(s): VITAMINB12, FOLATE, FERRITIN, TIBC, IRON, RETICCTPCT in the last 72 hours.  Sepsis Labs: Recent Labs  Lab 08/28/20 1302 08/29/20 1454  WBC 6.2 7.6    Microbiology Recent Results (from the past 240 hour(s))  SARS CORONAVIRUS 2 (TAT 6-24 HRS) Nasopharyngeal Nasopharyngeal Swab     Status: None   Collection Time: 08/29/20  2:54 PM   Specimen: Nasopharyngeal Swab  Result Value Ref Range Status   SARS Coronavirus 2 NEGATIVE NEGATIVE Final    Comment: (NOTE) SARS-CoV-2 target nucleic acids are NOT DETECTED.  The SARS-CoV-2 RNA is generally detectable in upper and lower respiratory specimens during the acute phase of infection. Negative results do not preclude SARS-CoV-2 infection, do not rule out co-infections with other pathogens, and should not be used as the sole basis for treatment or other patient management  decisions. Negative results must be combined with clinical observations, patient history, and epidemiological information. The expected result is Negative.  Fact Sheet for Patients: SugarRoll.be  Fact Sheet for Healthcare Providers: https://www.woods-mathews.com/  This test is not yet approved or cleared by the [place] FDA and  has been authorized for detection and/or diagnosis of SARS-CoV-2 by FDA under an Emergency Use Authorization (EUA). This EUA will remain  in effect (meaning this test can be used) for the duration of the COVID-19 declaration under Se ction 564(b)(1) of the Act, 21 U.S.C. section 360bbb-3(b)(1), unless the authorization is terminated or revoked sooner.  Performed at [facility], [place] [address], [place],  [address]     Procedures and diagnostic studies:  No results found.              LOS: 4 days   Dishon Kehoe  [facility]   Pager on [email or web address]. If 7PM-7AM, please contact night-coverage at [email or web address]     09/03/2020, 10:47 AM

## 2020-09-03 NOTE — TOC Progression Note (Addendum)
Transition of Care (TOC) - Progression Note    Patient Details  Name: Wanda Ochoa MRN: [id number] Date of Birth: 01/05/1946  Transition of Care (TOC) CM/SW Contact  ,  Michelle, LCSW Phone Number:[phone] 09/03/2020, 2:31 PM  Clinical Narrative:    This social worker met with patient at bedside to review bed offers. Patient has selected [facility]. Tina intake coordinator for [facility] contacted, message left for a return call regarding patient's transition there tomorrow.  3:41pm request to call patient's son [name] Cadman regarding patient's discharge. Patient agreeable to call. [phone] VM left requesting a return call.    , LCSW Transition of Care [phone]   Expected Discharge Plan: Skilled Nursing Facility Barriers to Discharge: Continued Medical Work up  Expected Discharge Plan and Services Expected Discharge Plan: Skilled Nursing Facility   Discharge Planning Services: CM Consult Post Acute Care Choice: Skilled Nursing Facility, IP Rehab Living arrangements for the past 2 months: Single Family Home                 DME Arranged: N/A DME Agency: NA       HH Arranged: NA           Social Determinants of Health (SDOH) Interventions    Readmission Risk Interventions No flowsheet data found.  

## 2020-09-04 DIAGNOSIS — F32A Depression, unspecified: Secondary | ICD-10-CM | POA: Diagnosis present

## 2020-09-04 DIAGNOSIS — W19XXXA Unspecified fall, initial encounter: Secondary | ICD-10-CM | POA: Diagnosis not present

## 2020-09-04 DIAGNOSIS — S62399S Other fracture of unspecified metacarpal bone, sequela: Secondary | ICD-10-CM | POA: Diagnosis not present

## 2020-09-04 DIAGNOSIS — I5031 Acute diastolic (congestive) heart failure: Secondary | ICD-10-CM | POA: Diagnosis present

## 2020-09-04 DIAGNOSIS — R296 Repeated falls: Secondary | ICD-10-CM | POA: Diagnosis not present

## 2020-09-04 DIAGNOSIS — J811 Chronic pulmonary edema: Secondary | ICD-10-CM | POA: Diagnosis not present

## 2020-09-04 DIAGNOSIS — E039 Hypothyroidism, unspecified: Secondary | ICD-10-CM | POA: Diagnosis present

## 2020-09-04 DIAGNOSIS — F331 Major depressive disorder, recurrent, moderate: Secondary | ICD-10-CM | POA: Diagnosis not present

## 2020-09-04 DIAGNOSIS — J45901 Unspecified asthma with (acute) exacerbation: Secondary | ICD-10-CM | POA: Diagnosis present

## 2020-09-04 DIAGNOSIS — R0902 Hypoxemia: Secondary | ICD-10-CM | POA: Diagnosis not present

## 2020-09-04 DIAGNOSIS — R062 Wheezing: Secondary | ICD-10-CM | POA: Diagnosis not present

## 2020-09-04 DIAGNOSIS — S62302D Unspecified fracture of third metacarpal bone, right hand, subsequent encounter for fracture with routine healing: Secondary | ICD-10-CM | POA: Diagnosis not present

## 2020-09-04 DIAGNOSIS — J45909 Unspecified asthma, uncomplicated: Secondary | ICD-10-CM | POA: Diagnosis not present

## 2020-09-04 DIAGNOSIS — F419 Anxiety disorder, unspecified: Secondary | ICD-10-CM | POA: Diagnosis present

## 2020-09-04 DIAGNOSIS — Z8249 Family history of ischemic heart disease and other diseases of the circulatory system: Secondary | ICD-10-CM | POA: Diagnosis not present

## 2020-09-04 DIAGNOSIS — S62291A Other fracture of first metacarpal bone, right hand, initial encounter for closed fracture: Secondary | ICD-10-CM | POA: Diagnosis not present

## 2020-09-04 DIAGNOSIS — K5900 Constipation, unspecified: Secondary | ICD-10-CM | POA: Diagnosis not present

## 2020-09-04 DIAGNOSIS — E538 Deficiency of other specified B group vitamins: Secondary | ICD-10-CM | POA: Diagnosis present

## 2020-09-04 DIAGNOSIS — J90 Pleural effusion, not elsewhere classified: Secondary | ICD-10-CM | POA: Diagnosis not present

## 2020-09-04 DIAGNOSIS — D519 Vitamin B12 deficiency anemia, unspecified: Secondary | ICD-10-CM | POA: Diagnosis not present

## 2020-09-04 DIAGNOSIS — Z736 Limitation of activities due to disability: Secondary | ICD-10-CM | POA: Diagnosis not present

## 2020-09-04 DIAGNOSIS — Z800 Family history of malignant neoplasm of digestive organs: Secondary | ICD-10-CM | POA: Diagnosis not present

## 2020-09-04 DIAGNOSIS — M25641 Stiffness of right hand, not elsewhere classified: Secondary | ICD-10-CM | POA: Diagnosis not present

## 2020-09-04 DIAGNOSIS — Z7982 Long term (current) use of aspirin: Secondary | ICD-10-CM | POA: Diagnosis not present

## 2020-09-04 DIAGNOSIS — D649 Anemia, unspecified: Secondary | ICD-10-CM | POA: Diagnosis present

## 2020-09-04 DIAGNOSIS — F333 Major depressive disorder, recurrent, severe with psychotic symptoms: Secondary | ICD-10-CM | POA: Diagnosis not present

## 2020-09-04 DIAGNOSIS — E559 Vitamin D deficiency, unspecified: Secondary | ICD-10-CM | POA: Diagnosis present

## 2020-09-04 DIAGNOSIS — E871 Hypo-osmolality and hyponatremia: Secondary | ICD-10-CM | POA: Diagnosis present

## 2020-09-04 DIAGNOSIS — R2681 Unsteadiness on feet: Secondary | ICD-10-CM | POA: Diagnosis not present

## 2020-09-04 DIAGNOSIS — M62838 Other muscle spasm: Secondary | ICD-10-CM | POA: Diagnosis not present

## 2020-09-04 DIAGNOSIS — J9601 Acute respiratory failure with hypoxia: Secondary | ICD-10-CM | POA: Diagnosis present

## 2020-09-04 DIAGNOSIS — Z803 Family history of malignant neoplasm of breast: Secondary | ICD-10-CM | POA: Diagnosis not present

## 2020-09-04 DIAGNOSIS — M6281 Muscle weakness (generalized): Secondary | ICD-10-CM | POA: Diagnosis not present

## 2020-09-04 DIAGNOSIS — E785 Hyperlipidemia, unspecified: Secondary | ICD-10-CM | POA: Diagnosis present

## 2020-09-04 DIAGNOSIS — R9431 Abnormal electrocardiogram [ECG] [EKG]: Secondary | ICD-10-CM | POA: Diagnosis present

## 2020-09-04 DIAGNOSIS — F411 Generalized anxiety disorder: Secondary | ICD-10-CM | POA: Diagnosis not present

## 2020-09-04 DIAGNOSIS — Z79890 Hormone replacement therapy (postmenopausal): Secondary | ICD-10-CM | POA: Diagnosis not present

## 2020-09-04 DIAGNOSIS — M25541 Pain in joints of right hand: Secondary | ICD-10-CM | POA: Diagnosis not present

## 2020-09-04 DIAGNOSIS — F064 Anxiety disorder due to known physiological condition: Secondary | ICD-10-CM | POA: Diagnosis not present

## 2020-09-04 DIAGNOSIS — F339 Major depressive disorder, recurrent, unspecified: Secondary | ICD-10-CM | POA: Diagnosis not present

## 2020-09-04 DIAGNOSIS — F5105 Insomnia due to other mental disorder: Secondary | ICD-10-CM | POA: Diagnosis not present

## 2020-09-04 DIAGNOSIS — I10 Essential (primary) hypertension: Secondary | ICD-10-CM | POA: Diagnosis not present

## 2020-09-04 DIAGNOSIS — Z79899 Other long term (current) drug therapy: Secondary | ICD-10-CM | POA: Diagnosis not present

## 2020-09-04 DIAGNOSIS — Z7401 Bed confinement status: Secondary | ICD-10-CM | POA: Diagnosis not present

## 2020-09-04 DIAGNOSIS — R0689 Other abnormalities of breathing: Secondary | ICD-10-CM | POA: Diagnosis not present

## 2020-09-04 DIAGNOSIS — I110 Hypertensive heart disease with heart failure: Secondary | ICD-10-CM | POA: Diagnosis present

## 2020-09-04 DIAGNOSIS — E569 Vitamin deficiency, unspecified: Secondary | ICD-10-CM | POA: Diagnosis not present

## 2020-09-04 DIAGNOSIS — J4521 Mild intermittent asthma with (acute) exacerbation: Secondary | ICD-10-CM | POA: Diagnosis not present

## 2020-09-04 DIAGNOSIS — R0602 Shortness of breath: Secondary | ICD-10-CM | POA: Diagnosis not present

## 2020-09-04 DIAGNOSIS — R609 Edema, unspecified: Secondary | ICD-10-CM | POA: Diagnosis not present

## 2020-09-04 DIAGNOSIS — M2550 Pain in unspecified joint: Secondary | ICD-10-CM | POA: Diagnosis not present

## 2020-09-04 DIAGNOSIS — R531 Weakness: Secondary | ICD-10-CM | POA: Diagnosis not present

## 2020-09-04 DIAGNOSIS — Z20822 Contact with and (suspected) exposure to covid-19: Secondary | ICD-10-CM | POA: Diagnosis present

## 2020-09-04 LAB — METHYLMALONIC ACID, SERUM: Methylmalonic Acid, Quantitative: 255 nmol/L (ref 0–378)

## 2020-09-04 LAB — BASIC METABOLIC PANEL
Anion gap: 5 (ref 5–15)
BUN: 22 mg/dL (ref 8–23)
CO2: 28 mmol/L (ref 22–32)
Calcium: 8.4 mg/dL — ABNORMAL LOW (ref 8.9–10.3)
Chloride: 103 mmol/L (ref 98–111)
Creatinine, Ser: 1.36 mg/dL — ABNORMAL HIGH (ref 0.44–1.00)
GFR, Estimated: 41 mL/min — ABNORMAL LOW (ref >60)
Glucose, Bld: 98 mg/dL (ref 70–99)
Potassium: 3.9 mmol/L (ref 3.5–5.1)
Sodium: 136 mmol/L (ref 135–145)

## 2020-09-04 LAB — CBC WITH DIFFERENTIAL/PLATELET
Abs Immature Granulocytes: 0.02 10*3/uL (ref 0.00–0.07)
Basophils Absolute: 0 10*3/uL (ref 0.0–0.1)
Basophils Relative: 1 %
Eosinophils Absolute: 0.2 10*3/uL (ref 0.0–0.5)
Eosinophils Relative: 3 %
HCT: 25.4 % — ABNORMAL LOW (ref 36.0–46.0)
Hemoglobin: 8.5 g/dL — ABNORMAL LOW (ref 12.0–15.0)
Immature Granulocytes: 0 %
Lymphocytes Relative: 41 %
Lymphs Abs: 2.7 10*3/uL (ref 0.7–4.0)
MCH: 33.7 pg (ref 26.0–34.0)
MCHC: 33.5 g/dL (ref 30.0–36.0)
MCV: 100.8 fL — ABNORMAL HIGH (ref 80.0–100.0)
Monocytes Absolute: 0.6 10*3/uL (ref 0.1–1.0)
Monocytes Relative: 9 %
Neutro Abs: 2.9 10*3/uL (ref 1.7–7.7)
Neutrophils Relative %: 46 %
Platelets: 222 10*3/uL (ref 150–400)
RBC: 2.52 MIL/uL — ABNORMAL LOW (ref 3.87–5.11)
RDW: 13 % (ref 11.5–15.5)
WBC: 6.4 10*3/uL (ref 4.0–10.5)
nRBC: 0 % (ref 0.0–0.2)

## 2020-09-04 LAB — RESP PANEL BY RT-PCR (FLU A&B, COVID) ARPGX2
Influenza A by PCR: NEGATIVE
Influenza B by PCR: NEGATIVE
SARS Coronavirus 2 by RT PCR: NEGATIVE

## 2020-09-04 LAB — VITAMIN B1: Vitamin B1 (Thiamine): 70.1 nmol/L (ref 66.5–200.0)

## 2020-09-04 MED ORDER — NORTRIPTYLINE HCL 25 MG PO CAPS: ORAL_CAPSULE | ORAL | 0 refills | Status: DC

## 2020-09-04 MED ORDER — LABETALOL HCL 200 MG PO TABS: 200.0000 mg | ORAL_TABLET | Freq: Two times a day (BID) | ORAL | Status: AC

## 2020-09-04 MED ORDER — LEVOTHYROXINE SODIUM 150 MCG PO TABS: 150.0000 ug | ORAL_TABLET | Freq: Every day | ORAL | Status: DC

## 2020-09-04 MED ORDER — CYANOCOBALAMIN 1000 MCG/ML IJ SOLN: 1000.0000 ug | Freq: Every day | INTRAMUSCULAR | 0 refills | Status: AC

## 2020-09-04 MED ORDER — VITAMIN B-12 1000 MCG PO TABS: 1000.0000 ug | ORAL_TABLET | Freq: Every day | ORAL | Status: AC

## 2020-09-04 MED ORDER — CYANOCOBALAMIN 1000 MCG/ML IJ SOLN: 1000.0000 ug | INTRAMUSCULAR | 0 refills | Status: AC

## 2020-09-04 NOTE — Care Management Important Message (Signed)
Important Message  Patient Details  Name: Wanda Ochoa MRN: [id number] Date of Birth: 10-29-1945   Medicare Important Message Given:  Yes     Juliann Pulse A Aprill Banko 09/04/2020, 11:32 AM

## 2020-09-04 NOTE — TOC Progression Note (Signed)
Transition of Care [facility]) - Progression Note    Patient Details  Name: Wanda Ochoa MRN: [id number] Date of Birth: February 04, 1946  Transition [facility]) CM/SW Contact  Shelbie Hutching, RN Phone Number: 09/04/2020, 1:27 PM  Clinical Narrative:    EMS arranged, patient is 3rd on the list for pickup.    Expected Discharge Plan: [place] Barriers to Discharge: Barriers Resolved  Expected Discharge Plan and Services Expected Discharge Plan: [place]   Discharge Planning Services: CM Consult Post Acute Care Choice: [place], IP Rehab Living arrangements for the past 2 months: Single Family Home Expected Discharge Date: 09/04/20               DME Arranged: N/A DME Agency: NA       HH Arranged: NA           Social Determinants of Health (SDOH) Interventions    Readmission Risk Interventions No flowsheet data found.

## 2020-09-04 NOTE — Progress Notes (Addendum)
Progress Note    Wanda Ochoa  OEV:[id number] DOB: May 26, 1945  DOA: 08/29/2020 PCP: Virginia Crews, MD      Brief Narrative:    Medical records reviewed and are as summarized below:  Wanda Ochoa is a 75 y.o. female with medical history significant for catatonic depression, anxiety, history of ECT by psychiatrist, complicated grief, who presented to the hospital because of generalized weakness and recurrent falls.  There was also report of short-term memory impairment.  Her husband passed away in June 11, 2020 and and her symptoms started after her husband's death.  She was admitted to the hospital because of generalized weakness and recurrent falls.  She was found to have severe vitamin B12 deficiency and she was started on vitamin B12 injections.  Work-up also revealed mild impaction fracture involving posterior head of the third metacarpal bone.  Orthopedic surgeon recommended splinting for comfort and nonweightbearing on right upper extremity for 1 to 2 weeks.  Psychiatrist was consulted because of history of depression, anxiety and ECT.  He recommended continuation of current psychotropics.  Her TSH was 45.7, (increased from 18.14 on 06/30/2020).  Her Synthroid was increased from 112 mcg daily to 150 mcg daily.    Assessment/Plan:   Principal Problem:   Frequent falls Active Problems:   Generalized anxiety disorder   Hypothyroidism   Benign hypertension   Hypercholesteremia   Severe recurrent major depression without psychotic features (HCC)   Primary osteoarthritis of right knee   Idiopathic gout involving toe of right foot   Stage 3a chronic kidney disease (HCC)   Weakness   Complicated grieving   Vitamin B12 deficiency   Body mass index is 25.7 kg/m.    Generalized weakness, recurrent falls: No acute abnormality on MRI brain and CT head.  Follow-up with social worker to assist with disposition to SNF.  Severe Vitamin B12 deficiency, short-term  memory impairment: Vitamin B12 level is 107.  This may be responsible for short-term memory impairment, generalized weakness and recurrent falls.  Continue daily vitamin B12 injections.  Mild impaction fracture involving posterior head of the third metacarpal bone: Appreciate input from Dr. Posey Pronto, orthopedic surgeon.  He recommended splinting for comfort and nonweightbearing on right upper extremity for 1 to 2 weeks (from 08/30/2020).  Outpatient follow-up with orthopedic surgeon in 1 to 2 weeks (from 08/30/2020).  History of major depression/catatonic depression with history of ECT, anxiety, complicated grieving: She has been evaluated by Dr. Weber Cooks, psychiatrist.  Continue psychotropics.  Hypothyroidism: TSH on 08/29/2020 45.7.  TSH on 06/30/2020 was 18.14.  Continue Synthroid,150 mcg daily (dose increased from 112 mcg).    Hypertension: Continue labetalol  Mild sinus bradycardia: Asymptomatic.  Hyponatremia: Resolved  Other comorbidities include hypertension, hyperlipidemia, CKD stage IIIb, anemia of chronic disease  Awaiting placement to SNF.   Diet Order             Diet Heart Room service appropriate? Yes; Fluid consistency: Thin  Diet effective now                      Consultants: Psychiatrist Orthopedic surgeon  Procedures: Splint applied to right forearm in the emergency room on 08/29/2020    Medications:     stroke: mapping our early stages of recovery book   Does not apply Once   aspirin  81 mg Oral Daily   citalopram  20 mg Oral Daily   cyanocobalamin  1,000 mcg Intramuscular Daily   enoxaparin (LOVENOX)  injection  40 mg Subcutaneous Q24H   labetalol  200 mg Oral BID   levothyroxine  150 mcg Oral Q0600   nortriptyline  75 mg Oral QHS   pravastatin  20 mg Oral QHS   QUEtiapine  100 mg Oral QHS   Continuous Infusions:   Anti-infectives (From admission, onward)    None              Family Communication/Anticipated D/C date and plan/Code Status    DVT prophylaxis: enoxaparin (LOVENOX) injection 40 mg Start: 08/29/20 2200 SCD's Start: 08/29/20 1611     Code Status: Full Code  Family Communication: None Disposition Plan:   Status is: Inpatient  Remains inpatient appropriate because:Unsafe d/c plan and Inpatient level of care appropriate due to severity of illness  Dispo: The patient is from: Home              Anticipated d/c is to: SNF              Patient currently is medically stable to d/c.   Difficult to place patient No               Subjective:   Interval events noted.  No new complaints.  She still has weakness in bilateral legs.   Objective:    Vitals:   09/03/20 1640 09/03/20 2053 09/04/20 0428 09/04/20 0747  BP: (!) 158/61 (!) 173/68 121/61 (!) 142/78  Pulse: 63 63 (!) 56 63  Resp: 20 16 14 18   Temp: 98.8 F (37.1 C) 98.4 F (36.9 C) 98 F (36.7 C) 98.5 F (36.9 C)  TempSrc: Oral Oral Oral   SpO2: 98% 99% 96% 97%  Weight:      Height:       No data found.    Intake/Output Summary (Last 24 hours) at 09/04/2020 1117 Last data filed at 09/04/2020 0945 Gross per 24 hour  Intake 240 ml  Output --  Net 240 ml   Filed Weights   08/29/20 1401  Weight: 78.9 kg    Exam:  GEN: NAD SKIN: No rash EYES: EOMI ENT: MMM CV: RRR PULM: CTA B ABD: soft, ND, NT, +BS CNS: AAO x 3, non focal EXT: No edema or tenderness.  Splint on right forearm.    Data Reviewed:   I have personally reviewed following labs and imaging studies:  Labs: Labs show the following:   Basic Metabolic Panel: Recent Labs  Lab 08/28/20 1302 08/29/20 1454 09/04/20 0545  NA 134* 131* 136  K 5.1 4.3 3.9  CL 100 100 103  CO2 26 25 28   GLUCOSE 97 102* 98  BUN 24* 17 22  CREATININE 1.73* 1.20* 1.36*  CALCIUM 8.9 8.9 8.4*   GFR Estimated Creatinine Clearance: 37.9 mL/min (A) (by C-G formula based on SCr of 1.36 mg/dL (H)). Liver Function Tests: Recent Labs  Lab 08/29/20 1454  AST 23  ALT 17   ALKPHOS 90  BILITOT 1.0  PROT 7.7  ALBUMIN 4.0   No results for input(s): LIPASE, AMYLASE in the last 168 hours. No results for input(s): AMMONIA in the last 168 hours. Coagulation profile No results for input(s): INR, PROTIME in the last 168 hours.  CBC: Recent Labs  Lab 08/28/20 1302 08/29/20 1454 09/04/20 0545  WBC 6.2 7.6 6.4  NEUTROABS 3.5 4.5 2.9  HGB 9.2* 9.7* 8.5*  HCT 27.8* 28.8* 25.4*  MCV 98.2 98.6 100.8*  PLT 207 210 222   Cardiac Enzymes: No results for input(s): CKTOTAL, CKMB,  CKMBINDEX, TROPONINI in the last 168 hours. BNP (last 3 results) No results for input(s): PROBNP in the last 8760 hours. CBG: No results for input(s): GLUCAP in the last 168 hours. D-Dimer: No results for input(s): DDIMER in the last 72 hours. Hgb A1c: No results for input(s): HGBA1C in the last 72 hours.  Lipid Profile: No results for input(s): CHOL, HDL, LDLCALC, TRIG, CHOLHDL, LDLDIRECT in the last 72 hours.  Thyroid function studies: No results for input(s): TSH, T4TOTAL, T3FREE, THYROIDAB in the last 72 hours.  Invalid input(s): FREET3  Anemia work up: No results for input(s): VITAMINB12, FOLATE, FERRITIN, TIBC, IRON, RETICCTPCT in the last 72 hours.  Sepsis Labs: Recent Labs  Lab 08/28/20 1302 08/29/20 1454 09/04/20 0545  WBC 6.2 7.6 6.4    Microbiology Recent Results (from the past 240 hour(s))  SARS CORONAVIRUS 2 (TAT 6-24 HRS) Nasopharyngeal Nasopharyngeal Swab     Status: None   Collection Time: 08/29/20  2:54 PM   Specimen: Nasopharyngeal Swab  Result Value Ref Range Status   SARS Coronavirus 2 NEGATIVE NEGATIVE Final    Comment: (NOTE) SARS-CoV-2 target nucleic acids are NOT DETECTED.  The SARS-CoV-2 RNA is generally detectable in upper and lower respiratory specimens during the acute phase of infection. Negative results do not preclude SARS-CoV-2 infection, do not rule out co-infections with other pathogens, and should not be used as the sole basis  for treatment or other patient management decisions. Negative results must be combined with clinical observations, patient history, and epidemiological information. The expected result is Negative.  Fact Sheet for Patients: SugarRoll.be  Fact Sheet for Healthcare Providers: https://www.woods-mathews.com/  This test is not yet approved or cleared by the [place] FDA and  has been authorized for detection and/or diagnosis of SARS-CoV-2 by FDA under an Emergency Use Authorization (EUA). This EUA will remain  in effect (meaning this test can be used) for the duration of the COVID-19 declaration under Se ction 564(b)(1) of the Act, 21 U.S.C. section 360bbb-3(b)(1), unless the authorization is terminated or revoked sooner.  Performed at [facility], [place] [address] [place], [place] [address]     Procedures and diagnostic studies:  No results found.              LOS: 5 days   Lennix Rotundo  [facility]   Pager on [email or web address]. If 7PM-7AM, please contact night-coverage at [email or web address]     09/04/2020, 11:17 AM

## 2020-09-04 NOTE — Discharge Summary (Signed)
Physician Discharge Summary  MAKAIAH TERWILLIGER JOI:[id number] DOB: 10-25-1945 DOA: 08/29/2020  PCP: Virginia Crews, MD  Admit date: 08/29/2020 Discharge date: 09/04/2020  Discharge disposition: SNF   Recommendations for Outpatient Follow-Up:   Follow-up with orthopedic surgeon, Dr. Leim Fabry, in 1 week. Follow-up with PCP and psychiatrist is recommended.   Discharge Diagnosis:   Principal Problem:   Frequent falls Active Problems:   Generalized anxiety disorder   Hypothyroidism   Benign hypertension   Hypercholesteremia   Severe recurrent major depression without psychotic features (HCC)   Primary osteoarthritis of right knee   Idiopathic gout involving toe of right foot   Stage 3a chronic kidney disease ([place])   Weakness   Complicated grieving   Vitamin B12 deficiency    Discharge Condition: Stable.  Diet recommendation:  Diet Order             Diet - low sodium heart healthy           Diet Heart Room service appropriate? Yes; Fluid consistency: Thin  Diet effective now                     Code Status: Full Code     Hospital Course:   Ms. ADELAI ACHEY is a 75 y.o. female with medical history significant for catatonic depression, anxiety, history of ECT by psychiatrist, complicated grief, who presented to the hospital because of generalized weakness and recurrent falls.  There was also report of short-term memory impairment.  Her husband passed away in 06-20-20 and and her symptoms started after her husband's death.   She was admitted to the hospital because of generalized weakness and recurrent falls.  She was found to have severe vitamin B12 deficiency and she was started on vitamin B12 injections.  Vitamin B12 deficiency may be responsible for lower extremity weakness, recurrent falls and memory impairment.  Work-up also revealed mild impaction fracture involving posterior head of the third metacarpal bone.  Orthopedic surgeon recommended  splinting for comfort and nonweightbearing on right upper extremity for 1 to 2 weeks.  Psychiatrist was consulted because of history of depression, anxiety and history of ECT.  He recommended continuation of current psychotropics.   Her TSH was 45.7, (increased from 18.14 on 06/30/2020).  Her Synthroid was increased from 112 mcg daily to 150 mcg daily.  She was evaluated by PT and OT recommended further rehabilitation at the skilled nursing facility.        Medical Consultants:   Orthopedic surgeon Psychiatrist   Discharge Exam:    Vitals:   09/03/20 June 21, 2051 09/04/20 0428 09/04/20 0747 09/04/20 1118  BP: (!) 173/68 121/61 (!) 142/78 124/69  Pulse: 63 (!) 56 63 (!) 57  Resp: 16 14 18 18   Temp: 98.4 F (36.9 C) 98 F (36.7 C) 98.5 F (36.9 C) 98.3 F (36.8 C)  TempSrc: Oral Oral    SpO2: 99% 96% 97% 100%  Weight:      Height:         GEN: NAD SKIN: Warm and dry EYES: EOMI ENT: MMM CV: RRR PULM: CTA B ABD: soft, ND, NT, +BS CNS: AAO x 3, non focal EXT: No edema or tenderness.  Splint on right forearm.   The results of significant diagnostics from this hospitalization (including imaging, microbiology, ancillary and laboratory) are listed below for reference.     Procedures and Diagnostic Studies:   DG Chest 2 View  Result Date: 08/29/2020 CLINICAL DATA:  Weakness.  EXAM: CHEST - 2 VIEW COMPARISON:  May 19, 2015. FINDINGS: The heart size and mediastinal contours are within normal limits. Both lungs are clear. The visualized skeletal structures are unremarkable. IMPRESSION: No active cardiopulmonary disease. Electronically Signed   By: Marijo Conception M.D.   On: 08/29/2020 14:54   CT Head Wo Contrast  Result Date: 08/29/2020 CLINICAL DATA:  Lower extremity weakness question stroke, knees collapsed, fall, falling a lot lately, history hypertension EXAM: CT HEAD WITHOUT CONTRAST TECHNIQUE: Contiguous axial images were obtained from the base of the skull through the vertex  without intravenous contrast. Sagittal and coronal MPR images reconstructed from axial data set. COMPARISON:  08/28/2020 FINDINGS: Brain: Generalized atrophy. Normal ventricular morphology. No midline shift or mass effect. Small vessel chronic ischemic changes of deep cerebral white matter. No intracranial hemorrhage, mass lesion, evidence of acute infarction, or extra-axial fluid collection. Vascular: Mild atherosclerotic calcification of internal carotid arteries at skull base. Skull: Intact Sinuses/Orbits: Clear Other: N/A IMPRESSION: Atrophy with small vessel chronic ischemic changes of deep cerebral white matter. No acute intracranial abnormalities. Electronically Signed   By: Lavonia Dana M.D.   On: 08/29/2020 15:05   MR BRAIN WO CONTRAST  Result Date: 08/29/2020 CLINICAL DATA:  Lower extremity weakness EXAM: MRI HEAD WITHOUT CONTRAST TECHNIQUE: Multiplanar, multiecho pulse sequences of the brain and surrounding structures were obtained without intravenous contrast. COMPARISON:  Correlation made with recent CT imaging. Prior MRI is from 20/10 FINDINGS: Brain: There is no acute infarction or intracranial hemorrhage. There is no intracranial mass, mass effect, or edema. There is no hydrocephalus or extra-axial fluid collection. Prominence of the ventricles and sulci reflects generalized parenchymal volume loss. Patchy T2 hyperintensity in the supratentorial white matter is nonspecific but may reflect mild chronic microvascular ischemic changes. Focus of susceptibility hypointensity in the right subinsular white matter likely reflects chronic microhemorrhage. Vascular: Major vessel flow voids at the skull base are preserved. Skull and upper cervical spine: Normal marrow signal is preserved. Sinuses/Orbits: Trace paranasal sinus mucosal thickening. Orbits are unremarkable. Other: Sella is unremarkable.  Mastoid air cells are clear. IMPRESSION: No acute infarction, hemorrhage, or mass. Mild chronic microvascular  ischemic changes. Electronically Signed   By: Macy Mis M.D.   On: 08/29/2020 18:45   DG Hand 2 View Right  Result Date: 08/29/2020 CLINICAL DATA:  Status post fall. EXAM: RIGHT HAND - 2 VIEW COMPARISON:  None FINDINGS: There is marked dorsal soft tissue swelling. Cortical irregularity is identified along the posterior head of the second metacarpal bone concerning for mild impaction fracture. No dislocation. IMPRESSION: 1. Suspect mild impaction fracture involving the posterior head of the second metacarpal bone. Correlation with exact site of tenderness advised. 2. Marked dorsal soft tissue swelling. Electronically Signed   By: Kerby Moors M.D.   On: 08/29/2020 14:57   DG Hand Complete Right  Result Date: 08/30/2020 CLINICAL DATA:  Right hand pain. EXAM: RIGHT HAND - COMPLETE 3+ VIEW COMPARISON:  August 29, 2020. FINDINGS: The right hand has been splinted. The abnormality involving the posterior portion of the distal head of the second metacarpal noted on prior exam is not well visualized on this radiograph. Dorsal soft tissue swelling is noted. No other definite bony abnormality is noted. IMPRESSION: Right hand has been splinted. Dorsal soft tissue swelling is noted. Abnormality involving posterior portion of distal head of second metacarpal noted on prior exam is not well visualized on this radiograph. Electronically Signed   By: Bobbe Medico.D.  On: 08/30/2020 14:14     Labs:   Basic Metabolic Panel: Recent Labs  Lab 08/28/20 1302 08/29/20 1454 09/04/20 0545  NA 134* 131* 136  K 5.1 4.3 3.9  CL 100 100 103  CO2 26 25 28   GLUCOSE 97 102* 98  BUN 24* 17 22  CREATININE 1.73* 1.20* 1.36*  CALCIUM 8.9 8.9 8.4*   GFR Estimated Creatinine Clearance: 37.9 mL/min (A) (by C-G formula based on SCr of 1.36 mg/dL (H)). Liver Function Tests: Recent Labs  Lab 08/29/20 1454  AST 23  ALT 17  ALKPHOS 90  BILITOT 1.0  PROT 7.7  ALBUMIN 4.0   No results for input(s): LIPASE,  AMYLASE in the last 168 hours. No results for input(s): AMMONIA in the last 168 hours. Coagulation profile No results for input(s): INR, PROTIME in the last 168 hours.  CBC: Recent Labs  Lab 08/28/20 1302 08/29/20 1454 09/04/20 0545  WBC 6.2 7.6 6.4  NEUTROABS 3.5 4.5 2.9  HGB 9.2* 9.7* 8.5*  HCT 27.8* 28.8* 25.4*  MCV 98.2 98.6 100.8*  PLT 207 210 222   Cardiac Enzymes: No results for input(s): CKTOTAL, CKMB, CKMBINDEX, TROPONINI in the last 168 hours. BNP: Invalid input(s): POCBNP CBG: No results for input(s): GLUCAP in the last 168 hours. D-Dimer No results for input(s): DDIMER in the last 72 hours. Hgb A1c No results for input(s): HGBA1C in the last 72 hours. Lipid Profile No results for input(s): CHOL, HDL, LDLCALC, TRIG, CHOLHDL, LDLDIRECT in the last 72 hours. Thyroid function studies No results for input(s): TSH, T4TOTAL, T3FREE, THYROIDAB in the last 72 hours.  Invalid input(s): FREET3 Anemia work up No results for input(s): VITAMINB12, FOLATE, FERRITIN, TIBC, IRON, RETICCTPCT in the last 72 hours. Microbiology Recent Results (from the past 240 hour(s))  SARS CORONAVIRUS 2 (TAT 6-24 HRS) Nasopharyngeal Nasopharyngeal Swab     Status: None   Collection Time: 08/29/20  2:54 PM   Specimen: Nasopharyngeal Swab  Result Value Ref Range Status   SARS Coronavirus 2 NEGATIVE NEGATIVE Final    Comment: (NOTE) SARS-CoV-2 target nucleic acids are NOT DETECTED.  The SARS-CoV-2 RNA is generally detectable in upper and lower respiratory specimens during the acute phase of infection. Negative results do not preclude SARS-CoV-2 infection, do not rule out co-infections with other pathogens, and should not be used as the sole basis for treatment or other patient management decisions. Negative results must be combined with clinical observations, patient history, and epidemiological information. The expected result is Negative.  Fact Sheet for  Patients: SugarRoll.be  Fact Sheet for Healthcare Providers: https://www.woods-mathews.com/  This test is not yet approved or cleared by the [place] FDA and  has been authorized for detection and/or diagnosis of SARS-CoV-2 by FDA under an Emergency Use Authorization (EUA). This EUA will remain  in effect (meaning this test can be used) for the duration of the COVID-19 declaration under Se ction 564(b)(1) of the Act, 21 U.S.C. section 360bbb-3(b)(1), unless the authorization is terminated or revoked sooner.  Performed at [facility], [place] [address], [place], [place] [address]      Discharge Instructions:   Discharge Instructions     Diet - low sodium heart healthy   Complete by: As directed    Increase activity slowly   Complete by: As directed       Allergies as of 09/04/2020       Reactions   Penicillins Anaphylaxis   Lipitor [atorvastatin] Rash   Severe rash on skin  after taking approximately 2 weeks.         Medication List     STOP taking these medications    ALPRAZolam 0.25 MG tablet Commonly known as: XANAX   hydrochlorothiazide 12.5 MG tablet Commonly known as: HYDRODIURIL       TAKE these medications    albuterol 108 (90 Base) MCG/ACT inhaler Commonly known as: Ventolin HFA INHALE 2 PUFFS INTO THE LUNGS EVERY 6 HOURS AS NEEDED   aspirin 81 MG tablet Take 81 mg by mouth daily.   citalopram 10 MG tablet Commonly known as: CELEXA Take 10 mg by mouth daily.   cyanocobalamin 1000 MCG/ML injection Commonly known as: (VITAMIN B-12) Inject 1 mL (1,000 mcg total) into the muscle daily for 2 days. Start taking on: September 05, 2020   cyanocobalamin 1000 MCG/ML injection Commonly known as: (VITAMIN B-12) Inject 1 mL (1,000 mcg total) into the muscle once a week for 4 doses. Start taking on: September 07, 2020   vitamin B-12 1000 MCG tablet Commonly known as: CYANOCOBALAMIN Take 1 tablet (1,000 mcg  total) by mouth daily. Start taking on: September 30, 2020   hydrOXYzine 25 MG capsule Commonly known as: VISTARIL Take 25 mg by mouth daily as needed for anxiety.   labetalol 200 MG tablet Commonly known as: NORMODYNE Take 1 tablet (200 mg total) by mouth 2 (two) times daily.   levothyroxine 150 MCG tablet Commonly known as: SYNTHROID Take 1 tablet (150 mcg total) by mouth daily. What changed:  medication strength how much to take   nortriptyline 25 MG capsule Commonly known as: PAMELOR Take 3 capsule at bed time   polyethylene glycol 17 g packet Commonly known as: MIRALAX / GLYCOLAX Take 17 g by mouth daily. Mix in 4-8 oz of fluid   pravastatin 20 MG tablet Commonly known as: PRAVACHOL Take 20 mg by mouth daily at 12 noon.   QUEtiapine 100 MG tablet Commonly known as: SEROQUEL Take 100 mg by mouth at bedtime.        Follow-up Information     Leim Fabry, MD. Schedule an appointment as soon as possible for a visit in 1 week(s).   Specialty: Orthopedic Surgery Contact information: [place] [address] [phone]                  Time coordinating discharge: 32 minutes  Signed:  Jennye Boroughs  [facility] 09/04/2020, 12:30 PM   Pager on [email or web address]. If 7PM-7AM, please contact night-coverage at [email or web address]

## 2020-09-04 NOTE — Progress Notes (Signed)
Occupational Therapy Treatment Patient Details Name: Wanda Ochoa MRN: [id number] DOB: February 18, 1946 Today's Date: 09/04/2020    History of present illness presented to ER secondary to bilat LE weakness, inability to ambulate, frequent falls; admitted for TIA/CVA work up versus management of complicated grieving process. Of note, has been actively receiving ECT treatments (4 total with this episode), most recent session approx 7-10 days prior to this admission.   OT comments  Wanda Ochoa is making good progress toward her functional goals, though remains limited by impaired balance and generalized weakness.  Pt was pleasant and motivated in today's session, requesting to walk further and engage in self-care tasks (toileting, bathing, grooming, dressing).  OT provided mod A progressing to min A for sit to stand transfers, min A for balance in ambulation, and CGA for static standing balance in ADL practice this date.  Pt able to ambulate with use of platform walker to BSC, sink, and recliner.  OT provided max assist for lower body dressing, min A for upper body dressing, and mod A for full body bathing.  Pt able to complete grooming tasks while standing at sink with unilateral UE support, CGA for balance, and min A for management of bimanual tasks.  Pt tolerated activity well.  Wanda Ochoa will continue to benefit from skilled OT services in acute setting to address functional strengthening, balance, endurance, and safety and independence in ADLs.     Follow Up Recommendations  CIR    Equipment Recommendations  Other (comment) (defer to next level of care)    Recommendations for Other Services      Precautions / Restrictions Precautions Precautions: Fall Precaution Comments: orthostatic hypotension Restrictions Weight Bearing Restrictions: Yes RUE Weight Bearing: Non weight bearing       Mobility Bed Mobility Overal bed mobility: Needs Assistance Bed Mobility: Supine to Sit      Supine to sit: Supervision     General bed mobility comments: left in recliner at end of session    Transfers Overall transfer level: Needs assistance Equipment used: Right platform walker Transfers: Sit to/from Stand Sit to Stand: Mod assist;Min assist         General transfer comment: Initial transfer provided mod A, provided min A for all other sit to stand transfers.    Balance Overall balance assessment: Needs assistance Sitting-balance support: No upper extremity supported;Feet supported Sitting balance-Leahy Scale: Good Sitting balance - Comments: Steady static sitting, reaching within BOS.   Standing balance support: Bilateral upper extremity supported Standing balance-Leahy Scale: Fair Standing balance comment: 2 slight LOB noted in ambulation, able to correct with Min A.  Able to maintain standing balance with CGA and BUE support.                           ADL either performed or assessed with clinical judgement   ADL Overall ADL's : Needs assistance/impaired Eating/Feeding: Sitting;Minimal assistance Eating/Feeding Details (indicate cue type and reason): Min A for mgt of two handed items Grooming: Wash/dry face;Oral care;Standing;Minimal assistance;Min guard Grooming Details (indicate cue type and reason): OT provided CGA for standing balance at sink as pt completed standing grooming tasks (oral care, wash face).  OT provided intermittent min A for two-handed tasks. Upper Body Bathing: Moderate assistance;Sitting Upper Body Bathing Details (indicate cue type and reason): Provided mod A to wash LUE & back given RUE NWB status Lower Body Bathing: Moderate assistance;Sit to/from stand Lower Body Bathing Details (indicate cue type and  reason): provided mod assist for LB bathing- washed bottom and upper backs of thighs while provided contact guard assist for standing balance.  Pt able to wash front upper thighs and lower legs/feet Upper Body Dressing :  Sitting;Minimal assistance   Lower Body Dressing: Maximal assistance;Sit to/from stand Lower Body Dressing Details (indicate cue type and reason): Provided max assist for donning B socks, threading B legs through pants, and pulling underwear over hips.  Pt able to assist, as well as doff B socks. Toilet Transfer: Minimal assistance;Ambulation;RW Toilet Transfer Details (indicate cue type and reason): Provided min A for balance in ambulation and transfers to [facility], use of platform walker Toileting- Clothing Manipulation and Hygiene: Maximal assistance;Sit to/from stand Toileting - Clothing Manipulation Details (indicate cue type and reason): Pt able to perform perihygiene, provided max assist for clothing management while pt stood with use of platform walker and CGA for balance     Functional mobility during ADLs: Minimal assistance;Rolling walker General ADL Comments: Provided mod A progressing to min A for sit to stand transfers, CGA for standing balance, Min A for balance during ambulation (using platform walker)     Vision Baseline Vision/History: Wears glasses Wears Glasses: At all times Patient Visual Report: No change from baseline     Perception     Praxis      Cognition Arousal/Alertness: Awake/alert Behavior During Therapy: WFL for tasks assessed/performed Overall Cognitive Status: Within Functional Limits for tasks assessed                                 General Comments: pleasant and motivated in session        Exercises Other Exercises Other Exercises: provided education re: fall and safety precautions, self care, transfer and mobility precautions   Shoulder Instructions       General Comments      Pertinent Vitals/ Pain       Pain Assessment: No/denies pain  Home Living                                          Prior Functioning/Environment              Frequency  Min 2X/week        Progress Toward Goals  OT  Goals(current goals can now be found in the care plan section)  Progress towards OT goals: Progressing toward goals  Acute Rehab OT Goals Patient Stated Goal: to get my strength back OT Goal Formulation: With patient Time For Goal Achievement: 09/13/20  Plan Discharge plan remains appropriate;Frequency remains appropriate    Co-evaluation                 AM-PAC OT "6 Clicks" Daily Activity     Outcome Measure   Help from another person eating meals?: A Little Help from another person taking care of personal grooming?: A Little Help from another person toileting, which includes using toliet, bedpan, or urinal?: A Lot Help from another person bathing (including washing, rinsing, drying)?: A Lot Help from another person to put on and taking off regular upper body clothing?: A Little Help from another person to put on and taking off regular lower body clothing?: A Lot 6 Click Score: 15    End of Session Equipment Utilized During Treatment: Gait belt;Rolling walker  OT Visit Diagnosis:  Other abnormalities of gait and mobility (R26.89);Other symptoms and signs involving the nervous system (R29.898)   Activity Tolerance Patient tolerated treatment well;No increased pain   Patient Left in chair;with call bell/phone within reach;with chair alarm set   Nurse Communication          Time: [phone] OT Time Calculation (min): 49 min  Charges: OT General Charges $OT Visit: 1 Visit OT Treatments $Self Care/Home Management : 38-52 mins  Jeneen Montgomery, OTR/L 09/04/20, 12:53 PM

## 2020-09-04 NOTE — TOC Transition Note (Signed)
Transition of Care [facility]) - CM/SW Discharge Note   Patient Details  Name: Wanda Ochoa MRN: [id number] Date of Birth: 10-09-1945  [facility] CM/SW Contact:  Shelbie Hutching, RN Phone Number: 09/04/2020, 12:57 PM   Clinical Narrative:    Patient medically cleared for discharge to [facility] today.  Family updated on discharge for today.  Pasrr returned, [id number] E- start today expires 10/04/20.  Patient will be going to room 705.  RN will call report.  RNCM will arrange EMS transport once COVID test has resulted.     Final next level of care: [place] Barriers to Discharge: Barriers Resolved   Patient Goals and CMS Choice Patient states their goals for this hospitalization and ongoing recovery are:: Patinet's son [name] would like for patient to get rehabilitation and would really like for her to be closer to him in [email or web address] Compare Post Acute Care list provided to:: Patient Choice offered to / list presented to : Patient, Adult Children  Discharge Placement PASRR number recieved: 09/04/20            Patient chooses bed at: [facility] [place] Patient to be transferred to facility by: [facility] Name of family member notified: Anderson Malta and [name] Patient and family notified of of transfer: 09/04/20  Discharge Plan and Services   Discharge Planning Services: CM Consult Post Acute Care Choice: [place], [facility] Rehab          DME Arranged: N/A DME Agency: NA       HH Arranged: NA          Social Determinants of Health (SDOH) Interventions     Readmission Risk Interventions No flowsheet data found.

## 2020-09-05 ENCOUNTER — Ambulatory Visit: Payer: Medicare Other | Admitting: Family Medicine

## 2020-09-05 DIAGNOSIS — E039 Hypothyroidism, unspecified: Secondary | ICD-10-CM | POA: Diagnosis not present

## 2020-09-05 DIAGNOSIS — D519 Vitamin B12 deficiency anemia, unspecified: Secondary | ICD-10-CM | POA: Diagnosis not present

## 2020-09-05 DIAGNOSIS — J45909 Unspecified asthma, uncomplicated: Secondary | ICD-10-CM | POA: Diagnosis not present

## 2020-09-05 DIAGNOSIS — I10 Essential (primary) hypertension: Secondary | ICD-10-CM | POA: Diagnosis not present

## 2020-09-05 DIAGNOSIS — S62399S Other fracture of unspecified metacarpal bone, sequela: Secondary | ICD-10-CM | POA: Diagnosis not present

## 2020-09-05 DIAGNOSIS — F411 Generalized anxiety disorder: Secondary | ICD-10-CM | POA: Diagnosis not present

## 2020-09-05 DIAGNOSIS — F339 Major depressive disorder, recurrent, unspecified: Secondary | ICD-10-CM | POA: Diagnosis not present

## 2020-09-05 DIAGNOSIS — M6281 Muscle weakness (generalized): Secondary | ICD-10-CM | POA: Diagnosis not present

## 2020-09-06 DIAGNOSIS — I10 Essential (primary) hypertension: Secondary | ICD-10-CM | POA: Diagnosis not present

## 2020-09-06 DIAGNOSIS — J45909 Unspecified asthma, uncomplicated: Secondary | ICD-10-CM | POA: Diagnosis not present

## 2020-09-06 DIAGNOSIS — F339 Major depressive disorder, recurrent, unspecified: Secondary | ICD-10-CM | POA: Diagnosis not present

## 2020-09-06 DIAGNOSIS — S62399S Other fracture of unspecified metacarpal bone, sequela: Secondary | ICD-10-CM | POA: Diagnosis not present

## 2020-09-06 DIAGNOSIS — M6281 Muscle weakness (generalized): Secondary | ICD-10-CM | POA: Diagnosis not present

## 2020-09-06 DIAGNOSIS — F411 Generalized anxiety disorder: Secondary | ICD-10-CM | POA: Diagnosis not present

## 2020-09-06 DIAGNOSIS — E039 Hypothyroidism, unspecified: Secondary | ICD-10-CM | POA: Diagnosis not present

## 2020-09-08 DIAGNOSIS — S62399S Other fracture of unspecified metacarpal bone, sequela: Secondary | ICD-10-CM | POA: Diagnosis not present

## 2020-09-08 DIAGNOSIS — E039 Hypothyroidism, unspecified: Secondary | ICD-10-CM | POA: Diagnosis not present

## 2020-09-08 DIAGNOSIS — M6281 Muscle weakness (generalized): Secondary | ICD-10-CM | POA: Diagnosis not present

## 2020-09-08 DIAGNOSIS — K5900 Constipation, unspecified: Secondary | ICD-10-CM | POA: Diagnosis not present

## 2020-09-10 DIAGNOSIS — F5105 Insomnia due to other mental disorder: Secondary | ICD-10-CM | POA: Diagnosis not present

## 2020-09-10 DIAGNOSIS — F411 Generalized anxiety disorder: Secondary | ICD-10-CM | POA: Diagnosis not present

## 2020-09-10 DIAGNOSIS — F331 Major depressive disorder, recurrent, moderate: Secondary | ICD-10-CM | POA: Diagnosis not present

## 2020-09-11 DIAGNOSIS — S62399S Other fracture of unspecified metacarpal bone, sequela: Secondary | ICD-10-CM | POA: Diagnosis not present

## 2020-09-11 DIAGNOSIS — K5900 Constipation, unspecified: Secondary | ICD-10-CM | POA: Diagnosis not present

## 2020-09-11 DIAGNOSIS — M6281 Muscle weakness (generalized): Secondary | ICD-10-CM | POA: Diagnosis not present

## 2020-09-11 DIAGNOSIS — I10 Essential (primary) hypertension: Secondary | ICD-10-CM | POA: Diagnosis not present

## 2020-09-12 ENCOUNTER — Encounter: Payer: Medicare Other | Admitting: Family Medicine

## 2020-09-12 DIAGNOSIS — M25641 Stiffness of right hand, not elsewhere classified: Secondary | ICD-10-CM | POA: Diagnosis not present

## 2020-09-12 DIAGNOSIS — M25541 Pain in joints of right hand: Secondary | ICD-10-CM | POA: Diagnosis not present

## 2020-09-12 DIAGNOSIS — S62291A Other fracture of first metacarpal bone, right hand, initial encounter for closed fracture: Secondary | ICD-10-CM | POA: Diagnosis not present

## 2020-09-13 DIAGNOSIS — S62399S Other fracture of unspecified metacarpal bone, sequela: Secondary | ICD-10-CM | POA: Diagnosis not present

## 2020-09-13 DIAGNOSIS — I10 Essential (primary) hypertension: Secondary | ICD-10-CM | POA: Diagnosis not present

## 2020-09-13 DIAGNOSIS — M62838 Other muscle spasm: Secondary | ICD-10-CM | POA: Diagnosis not present

## 2020-09-18 ENCOUNTER — Telehealth: Payer: Self-pay

## 2020-09-18 NOTE — Telephone Encounter (Signed)
Copied from [place] [id number]. Topic: General - Other >> Sep 18, 2020 10:50 AM Bayard Beaver wrote: Reason for CRM: patients son called in , Wanda Ochoa, about plan of actions while patient is in nursing home, admitted last week. Please call back

## 2020-09-18 NOTE — Telephone Encounter (Signed)
Please advise 

## 2020-09-19 DIAGNOSIS — M62838 Other muscle spasm: Secondary | ICD-10-CM | POA: Diagnosis not present

## 2020-09-19 DIAGNOSIS — S62399S Other fracture of unspecified metacarpal bone, sequela: Secondary | ICD-10-CM | POA: Diagnosis not present

## 2020-09-19 DIAGNOSIS — I10 Essential (primary) hypertension: Secondary | ICD-10-CM | POA: Diagnosis not present

## 2020-09-19 NOTE — Telephone Encounter (Signed)
Called and spoke with patients son  and advised him of doctors message below, he states that patient would be getting discharged on Monday and would like to schedule hospital follow up next week. After speaking with Dr. B and patients son he was advised that she has no availability at this time and that patient can be seen by another provider for hospital follow up. I advised patients son that schedules appear to be booked out the next two weeks, I advised I would have scheduler give him a call to schedule hospital follow up with available provider and he understood. KW

## 2020-09-19 NOTE — Telephone Encounter (Signed)
Typically they are followed by the medical director at the facility while admitted there and then schedule hosp f/u with PCP after discharge

## 2020-09-20 ENCOUNTER — Other Ambulatory Visit: Payer: Self-pay

## 2020-09-20 ENCOUNTER — Ambulatory Visit: Payer: Medicare Other | Attending: Orthopedic Surgery | Admitting: Occupational Therapy

## 2020-09-20 DIAGNOSIS — M25641 Stiffness of right hand, not elsewhere classified: Secondary | ICD-10-CM | POA: Insufficient documentation

## 2020-09-20 NOTE — Therapy (Signed)
[facility] [address] [place], [address] Phone: [phone]   Fax:  [phone]  Occupational Therapy Screen  Patient Details  Name: Wanda Ochoa MRN: [id number] Date of Birth: October 22, 1945 No data recorded  Encounter Date: 09/20/2020   OT End of Session - 09/20/20 1834     Visit Number 0             Past Medical History:  Diagnosis Date   Anxiety    Arthritis    right knee   Asthma    Depression    catatonic depression per pt report   HTN (hypertension)    Thyroid disease     Past Surgical History:  Procedure Laterality Date   ABDOMINAL HYSTERECTOMY  1990   BLADDER SUSPENSION  1990   BREAST CYST ASPIRATION Right 1985   BREAST EXCISIONAL BIOPSY Right 1967   EXCISIONAL BX - NEG   BREAST SURGERY Right 1967   Lumpectomy   CESAREAN SECTION     COLONOSCOPY WITH PROPOFOL N/A 12/18/2018   Procedure: COLONOSCOPY WITH PROPOFOL;  Surgeon: Lollie Sails, MD;  Location: [facility] ENDOSCOPY;  Service: Endoscopy;  Laterality: N/A;   HEMORRHOIDECTOMY WITH HEMORRHOID BANDING  1988    There were no vitals filed for this visit.   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 Yes    Pain Score 5     Pain Location Hand    Pain Orientation Right    Pain Descriptors / Indicators Aching;Tender;Tightness    Pain Type Acute pain    Pain Onset 1 to 4 weeks ago    Pain Frequency Intermittent    Aggravating Factors  with AROM of digits                 Pt fell at 08/29/20 and seen ortho 09/12/20 and was refer to me for out pt OT:   Pt refer by ortho for R 4th head of metacarpal fx - pt pain with AROM 5/10  Pt in ulnar gutter splint for 4th and 5th digits  AROM :R hand  MC 65, 70,65 and 40  PIP's 90,90,70,55  From 2nd thru 5th   Contrast done prior to review of HEP -pt ed on doing at home 3 x day  Keep pain free AROM  MC flexion , PIP flexion and composite fist -but keep pain under 2/10  10 reps Opposition to all digits 10 reps  Pain free   Pt seen for screen today because she is receiving rehab at [facility] and then she will get [facility] PT and OT  Pt to follow up with ortho on 14th and can get back to me if done with [facility]                           Patient will benefit from skilled therapeutic intervention in order to improve the following deficits and impairments:           Visit Diagnosis: Stiffness of right hand, not elsewhere classified    Problem List Patient Active Problem List   Diagnosis Date Noted   Vitamin B12 deficiency 08/31/2020   Frequent falls 08/30/2020   Weakness 73/53/2992   Complicated grieving  08/29/2020   Drug reaction, subsequent encounter 09/23/2019   Rash and nonspecific skin eruption 09/23/2019   Stable angina pectoris ([place]) 09/10/2019   Stage 3a chronic kidney disease ([place]) 09/10/2019   Idiopathic gout involving toe of right foot 12/07/2018   Primary osteoarthritis of right knee 03/07/2017   Hematuria 09/11/2016   Venous insufficiency of both lower extremities 09/04/2016   Severe recurrent major depression without psychotic features ([place])    Hypercholesteremia 05/02/2015   Mitral valve prolapse 02/21/2015   Allergic rhinitis 12/24/2014   Osteopenia 12/24/2014   Hypothyroidism 11/24/2014   Generalized anxiety disorder 02/24/2012   Nocturnal hypoxemia 01/20/2012   Abdominal mass 01/20/2012   Temporary cerebral vascular dysfunction 01/02/2009   Benign hypertension 03/28/2008   Asthma, exogenous 10/22/2007   Adaptive colitis 03/12/2003    Rosalyn Gess OTR/L,CLT 09/20/2020, 6:35 PM  [facility] [address] [place], [address] Phone: [phone]   Fax:   [phone]  Name: Wanda Ochoa MRN: [id number] Date of Birth: 1945/08/29

## 2020-09-21 DIAGNOSIS — J45909 Unspecified asthma, uncomplicated: Secondary | ICD-10-CM | POA: Diagnosis not present

## 2020-09-21 DIAGNOSIS — S62399S Other fracture of unspecified metacarpal bone, sequela: Secondary | ICD-10-CM | POA: Diagnosis not present

## 2020-09-21 DIAGNOSIS — D519 Vitamin B12 deficiency anemia, unspecified: Secondary | ICD-10-CM | POA: Diagnosis not present

## 2020-09-21 DIAGNOSIS — E039 Hypothyroidism, unspecified: Secondary | ICD-10-CM | POA: Diagnosis not present

## 2020-09-21 DIAGNOSIS — F411 Generalized anxiety disorder: Secondary | ICD-10-CM | POA: Diagnosis not present

## 2020-09-21 DIAGNOSIS — M6281 Muscle weakness (generalized): Secondary | ICD-10-CM | POA: Diagnosis not present

## 2020-09-21 DIAGNOSIS — F339 Major depressive disorder, recurrent, unspecified: Secondary | ICD-10-CM | POA: Diagnosis not present

## 2020-09-21 DIAGNOSIS — I10 Essential (primary) hypertension: Secondary | ICD-10-CM | POA: Diagnosis not present

## 2020-09-22 ENCOUNTER — Emergency Department: Payer: Medicare Other

## 2020-09-22 ENCOUNTER — Inpatient Hospital Stay
Admission: EM | Admit: 2020-09-22 | Discharge: 2020-09-26 | DRG: 202 | Disposition: A | Discharge status: Home Health | Payer: Medicare Other | Attending: Family Medicine | Admitting: Family Medicine

## 2020-09-22 DIAGNOSIS — E559 Vitamin D deficiency, unspecified: Secondary | ICD-10-CM | POA: Diagnosis present

## 2020-09-22 DIAGNOSIS — I5031 Acute diastolic (congestive) heart failure: Secondary | ICD-10-CM | POA: Diagnosis not present

## 2020-09-22 DIAGNOSIS — Z800 Family history of malignant neoplasm of digestive organs: Secondary | ICD-10-CM

## 2020-09-22 DIAGNOSIS — J811 Chronic pulmonary edema: Secondary | ICD-10-CM | POA: Diagnosis not present

## 2020-09-22 DIAGNOSIS — Z20822 Contact with and (suspected) exposure to covid-19: Secondary | ICD-10-CM | POA: Diagnosis present

## 2020-09-22 DIAGNOSIS — J45901 Unspecified asthma with (acute) exacerbation: Secondary | ICD-10-CM | POA: Diagnosis not present

## 2020-09-22 DIAGNOSIS — R609 Edema, unspecified: Secondary | ICD-10-CM | POA: Diagnosis not present

## 2020-09-22 DIAGNOSIS — R0602 Shortness of breath: Secondary | ICD-10-CM

## 2020-09-22 DIAGNOSIS — J45909 Unspecified asthma, uncomplicated: Secondary | ICD-10-CM

## 2020-09-22 DIAGNOSIS — I10 Essential (primary) hypertension: Secondary | ICD-10-CM | POA: Diagnosis not present

## 2020-09-22 DIAGNOSIS — Z7982 Long term (current) use of aspirin: Secondary | ICD-10-CM

## 2020-09-22 DIAGNOSIS — E039 Hypothyroidism, unspecified: Secondary | ICD-10-CM | POA: Diagnosis present

## 2020-09-22 DIAGNOSIS — Z79890 Hormone replacement therapy (postmenopausal): Secondary | ICD-10-CM

## 2020-09-22 DIAGNOSIS — F32A Depression, unspecified: Secondary | ICD-10-CM | POA: Diagnosis present

## 2020-09-22 DIAGNOSIS — D649 Anemia, unspecified: Secondary | ICD-10-CM | POA: Diagnosis present

## 2020-09-22 DIAGNOSIS — R0902 Hypoxemia: Secondary | ICD-10-CM | POA: Diagnosis not present

## 2020-09-22 DIAGNOSIS — J9601 Acute respiratory failure with hypoxia: Secondary | ICD-10-CM | POA: Diagnosis not present

## 2020-09-22 DIAGNOSIS — I110 Hypertensive heart disease with heart failure: Secondary | ICD-10-CM | POA: Diagnosis present

## 2020-09-22 DIAGNOSIS — Z79899 Other long term (current) drug therapy: Secondary | ICD-10-CM

## 2020-09-22 DIAGNOSIS — Z8249 Family history of ischemic heart disease and other diseases of the circulatory system: Secondary | ICD-10-CM

## 2020-09-22 DIAGNOSIS — E871 Hypo-osmolality and hyponatremia: Secondary | ICD-10-CM | POA: Diagnosis present

## 2020-09-22 DIAGNOSIS — F419 Anxiety disorder, unspecified: Secondary | ICD-10-CM | POA: Diagnosis present

## 2020-09-22 DIAGNOSIS — R0689 Other abnormalities of breathing: Secondary | ICD-10-CM | POA: Diagnosis not present

## 2020-09-22 DIAGNOSIS — E785 Hyperlipidemia, unspecified: Secondary | ICD-10-CM | POA: Diagnosis present

## 2020-09-22 DIAGNOSIS — J90 Pleural effusion, not elsewhere classified: Secondary | ICD-10-CM | POA: Diagnosis not present

## 2020-09-22 DIAGNOSIS — R062 Wheezing: Secondary | ICD-10-CM | POA: Diagnosis not present

## 2020-09-22 DIAGNOSIS — Z803 Family history of malignant neoplasm of breast: Secondary | ICD-10-CM

## 2020-09-22 DIAGNOSIS — E538 Deficiency of other specified B group vitamins: Secondary | ICD-10-CM | POA: Diagnosis present

## 2020-09-22 DIAGNOSIS — R9431 Abnormal electrocardiogram [ECG] [EKG]: Secondary | ICD-10-CM | POA: Diagnosis present

## 2020-09-22 LAB — BASIC METABOLIC PANEL
Anion gap: 9 (ref 5–15)
BUN: 16 mg/dL (ref 8–23)
CO2: 25 mmol/L (ref 22–32)
Calcium: 8.7 mg/dL — ABNORMAL LOW (ref 8.9–10.3)
Chloride: 99 mmol/L (ref 98–111)
Creatinine, Ser: 0.91 mg/dL (ref 0.44–1.00)
GFR, Estimated: >60 mL/min (ref >60)
Glucose, Bld: 112 mg/dL — ABNORMAL HIGH (ref 70–99)
Potassium: 4 mmol/L (ref 3.5–5.1)
Sodium: 133 mmol/L — ABNORMAL LOW (ref 135–145)

## 2020-09-22 LAB — CBC
HCT: 25.2 % — ABNORMAL LOW (ref 36.0–46.0)
Hemoglobin: 8.4 g/dL — ABNORMAL LOW (ref 12.0–15.0)
MCH: 33.2 pg (ref 26.0–34.0)
MCHC: 33.3 g/dL (ref 30.0–36.0)
MCV: 99.6 fL (ref 80.0–100.0)
Platelets: 200 10*3/uL (ref 150–400)
RBC: 2.53 MIL/uL — ABNORMAL LOW (ref 3.87–5.11)
RDW: 12.6 % (ref 11.5–15.5)
WBC: 8.7 10*3/uL (ref 4.0–10.5)
nRBC: 0 % (ref 0.0–0.2)

## 2020-09-22 MED ORDER — ALBUTEROL SULFATE HFA 108 (90 BASE) MCG/ACT IN AERS
2.0000 | INHALATION_SPRAY | RESPIRATORY_TRACT | Status: DC | PRN
  Filled 2020-09-22: qty 6.7

## 2020-09-22 NOTE — ED Triage Notes (Signed)
Patient presents to ER from home. Patients family called d/t shortness of breath. Patient reports she was discharged from [facility] today. Per EMS patient oxygen 80% on arrival to patients home. Patient given breathing tx in route to ER. Patient reports mild BLE swelling. Patient A&OX3.   20G L forearm 2 duonebs, 125 mg solumedrol

## 2020-09-22 NOTE — ED Provider Notes (Signed)
[facility] Emergency Department Provider Note   ____________________________________________   Event Date/Time   First MD Initiated Contact with Patient 09/22/20 2316     (approximate)  I have reviewed the triage vital signs and the nursing notes.   HISTORY  Chief Complaint Shortness of Breath    HPI Wanda Ochoa is a 75 y.o. female presents via EMS for shortness of breath  LOCATION: Chest DURATION: 2 days TIMING: Worsening since onset SEVERITY: Severe QUALITY: Shortness of breath CONTEXT: Began feeling short of breath yesterday and has tried home albuterol treatments with no effect.  Patient got 2 doses of DuoNeb treatments by EMS as well as 125 of Solu-Medrol IV. MODIFYING FACTORS: Exertion worsens the shortness of breath and it is partially relieved at rest ASSOCIATED SYMPTOMS: Denies   Per medical record review patient has history of chronic bronchitis          Past Medical History:  Diagnosis Date   Anxiety    Arthritis    right knee   Asthma    Depression    catatonic depression per pt report   HTN (hypertension)    Thyroid disease     Patient Active Problem List   Diagnosis Date Noted   Vitamin B12 deficiency 08/31/2020   Frequent falls 08/30/2020   Weakness 29/92/4268   Complicated grieving 34/19/6222   Drug reaction, subsequent encounter 09/23/2019   Rash and nonspecific skin eruption 09/23/2019   Stable angina pectoris ([place]) 09/10/2019   Stage 3a chronic kidney disease ([place]) 09/10/2019   Idiopathic gout involving toe of right foot 12/07/2018   Primary osteoarthritis of right knee 03/07/2017   Hematuria 09/11/2016   Venous insufficiency of both lower extremities 09/04/2016   Severe recurrent major depression without psychotic features ([place])    Hypercholesteremia 05/02/2015   Mitral valve prolapse 02/21/2015   Allergic rhinitis 12/24/2014   Osteopenia 12/24/2014   Hypothyroidism 11/24/2014   Generalized  anxiety disorder 02/24/2012   Nocturnal hypoxemia 01/20/2012   Abdominal mass 01/20/2012   Temporary cerebral vascular dysfunction 01/02/2009   Benign hypertension 03/28/2008   Asthma, exogenous 10/22/2007   Adaptive colitis 03/12/2003    Past Surgical History:  Procedure Laterality Date   ABDOMINAL HYSTERECTOMY  1990   BLADDER SUSPENSION  1990   BREAST CYST ASPIRATION Right 1985   BREAST EXCISIONAL BIOPSY Right 1967   EXCISIONAL BX - NEG   BREAST SURGERY Right 1967   Lumpectomy   CESAREAN SECTION     COLONOSCOPY WITH PROPOFOL N/A 12/18/2018   Procedure: COLONOSCOPY WITH PROPOFOL;  Surgeon: Lollie Sails, MD;  Location: [facility] ENDOSCOPY;  Service: Endoscopy;  Laterality: N/A;   [place]    Prior to Admission medications   Medication Sig Start Date End Date Taking? Authorizing Provider  albuterol (VENTOLIN HFA) 108 (90 Base) MCG/ACT inhaler INHALE 2 PUFFS INTO THE LUNGS EVERY 6 HOURS AS NEEDED 08/10/19   Bacigalupo, Dionne Bucy, MD  aspirin 81 MG tablet Take 81 mg by mouth daily.    [provider]  citalopram (CELEXA) 10 MG tablet Take 10 mg by mouth daily. 08/08/19   [provider]  cyanocobalamin (,VITAMIN B-12,) 1000 MCG/ML injection Inject 1 mL (1,000 mcg total) into the muscle once a week for 4 doses. 09/07/20 09/29/20  Jennye Boroughs, MD  hydrOXYzine (VISTARIL) 25 MG capsule Take 25 mg by mouth daily as needed for anxiety. 07/03/20   [provider]  labetalol (NORMODYNE) 200 MG tablet Take  1 tablet (200 mg total) by mouth 2 (two) times daily. 09/04/20   Jennye Boroughs, MD  levothyroxine (SYNTHROID) 150 MCG tablet Take 1 tablet (150 mcg total) by mouth daily. 09/04/20   Jennye Boroughs, MD  nortriptyline (PAMELOR) 25 MG capsule Take 3 capsule at bed time 09/04/20   Jennye Boroughs, MD  polyethylene glycol (MIRALAX / GLYCOLAX) 17 g packet Take 17 g by mouth daily. Mix in 4-8 oz of fluid    [provider]   pravastatin (PRAVACHOL) 20 MG tablet Take 20 mg by mouth daily at 12 noon. 07/23/20   [provider]  QUEtiapine (SEROQUEL) 100 MG tablet Take 100 mg by mouth at bedtime.    [provider]  vitamin B-12 (CYANOCOBALAMIN) 1000 MCG tablet Take 1 tablet (1,000 mcg total) by mouth daily. 09/30/20   Jennye Boroughs, MD    Allergies Penicillins and Lipitor [atorvastatin]  Family History  Problem Relation Age of Onset   Depression Father    Suicidality Father    Depression Sister    Breast cancer Sister 64   Autism Son    Heart disease Mother    Colon cancer Mother    Hypertension Mother    Anxiety disorder Mother    Mental illness Daughter    Allergies Brother    Allergies Sister    Asthma Maternal Uncle    Heart disease Maternal Grandfather    Heart disease Maternal Grandmother    Breast cancer Paternal Grandmother    Breast cancer Other     Social History Social History   Tobacco Use   Smoking status: Never   Smokeless tobacco: Never  Vaping Use   Vaping Use: Never used  Substance Use Topics   Alcohol use: No   Drug use: No    Review of Systems Constitutional: No fever/chills Eyes: No visual changes. ENT: No sore throat. Cardiovascular: Denies chest pain. Respiratory: Endorses shortness of breath. Gastrointestinal: No abdominal pain.  No nausea, no vomiting.  No diarrhea. Genitourinary: Negative for dysuria. Musculoskeletal: Negative for acute arthralgias Skin: Negative for rash. Neurological: Negative for headaches, weakness/numbness/paresthesias in any extremity Psychiatric: Negative for suicidal ideation/homicidal ideation   ____________________________________________   PHYSICAL EXAM:  VITAL SIGNS: ED Triage Vitals [09/22/20 2257]  Enc Vitals Group     BP (!) 141/68     Pulse Rate 64     Resp (!) 24     Temp 98 F (36.7 C)     Temp Source Oral     SpO2 93 %     Weight 171 lb 15.3 oz (78 kg)     Height 5\' 9"  (1.753 m)     Head  Circumference      [facility]      Pain Score 0     Pain Loc      Pain Edu?      Excl. in [place]?    Constitutional: Alert and oriented. Well appearing and in no acute distress. Eyes: Conjunctivae are normal. PERRL. Head: Atraumatic. Nose: No congestion/rhinnorhea. Mouth/Throat: Mucous membranes are moist. Neck: No stridor Cardiovascular: Grossly normal heart sounds.  Good peripheral circulation. Respiratory: Inspiratory and expiratory wheezes bilaterally.  Increased respiratory effort.  No retractions. Gastrointestinal: Soft and nontender. No distention. Musculoskeletal: No obvious deformities Neurologic:  Normal speech and language. No gross focal neurologic deficits are appreciated. Skin:  Skin is warm and dry. No rash noted. Psychiatric: Mood and affect are normal. Speech and behavior are normal.  ____________________________________________   LABS (all  labs ordered are listed, but only abnormal results are displayed)  Labs Reviewed  CBC - Abnormal; Notable for the following components:      Result Value   RBC 2.53 (*)    Hemoglobin 8.4 (*)    HCT 25.2 (*)    All other components within normal limits  BASIC METABOLIC PANEL  BRAIN NATRIURETIC PEPTIDE  TROPONIN I (HIGH SENSITIVITY)    PROCEDURES  Procedure(s) performed (including Critical Care):  Procedures   ____________________________________________   INITIAL IMPRESSION / ASSESSMENT AND PLAN / ED COURSE  As part of my medical decision making, I reviewed the following data within the electronic medical record, if available:  Nursing notes reviewed and incorporated, Labs reviewed, EKG interpreted, Old chart reviewed, Radiograph reviewed and Notes from prior ED visits reviewed and incorporated        The patient appears to be suffering from a moderate exacerbation of chronic bronchitis.  Based on the history, exam, CXR/EKG, and further workup I dont suspect any other emergent cause of this presentation, such as  pneumonia, acute coronary syndrome, congestive heart failure, pulmonary embolism, or pneumothorax.  ED Interventions: bronchodilators, steroids, antibiotics, reassess  Care of this patient will be signed out to the oncoming physician at the end of my shift.  All pertinent patient information conveyed and all questions answered.  All further care and disposition decisions will be made by the oncoming physician.       ____________________________________________   FINAL CLINICAL IMPRESSION(S) / ED DIAGNOSES  Final diagnoses:  Shortness of breath     ED Discharge Orders     None        Note:  This document was prepared using Dragon voice recognition software and may include unintentional dictation errors.    Naaman Plummer, MD 09/22/20 [phone]

## 2020-09-23 ENCOUNTER — Inpatient Hospital Stay (HOSPITAL_COMMUNITY)
Admit: 2020-09-23 | Discharge: 2020-09-23 | Disposition: A | Discharge status: Home / Self Care | Payer: Medicare Other | Attending: Family Medicine | Admitting: Family Medicine

## 2020-09-23 ENCOUNTER — Inpatient Hospital Stay: Payer: Medicare Other

## 2020-09-23 DIAGNOSIS — I5031 Acute diastolic (congestive) heart failure: Secondary | ICD-10-CM

## 2020-09-23 DIAGNOSIS — F32A Depression, unspecified: Secondary | ICD-10-CM | POA: Diagnosis present

## 2020-09-23 DIAGNOSIS — E871 Hypo-osmolality and hyponatremia: Secondary | ICD-10-CM | POA: Diagnosis present

## 2020-09-23 DIAGNOSIS — Z79899 Other long term (current) drug therapy: Secondary | ICD-10-CM | POA: Diagnosis not present

## 2020-09-23 DIAGNOSIS — J4521 Mild intermittent asthma with (acute) exacerbation: Secondary | ICD-10-CM | POA: Diagnosis not present

## 2020-09-23 DIAGNOSIS — F419 Anxiety disorder, unspecified: Secondary | ICD-10-CM | POA: Diagnosis present

## 2020-09-23 DIAGNOSIS — R9431 Abnormal electrocardiogram [ECG] [EKG]: Secondary | ICD-10-CM | POA: Diagnosis present

## 2020-09-23 DIAGNOSIS — J90 Pleural effusion, not elsewhere classified: Secondary | ICD-10-CM | POA: Diagnosis not present

## 2020-09-23 DIAGNOSIS — J45901 Unspecified asthma with (acute) exacerbation: Secondary | ICD-10-CM | POA: Diagnosis present

## 2020-09-23 DIAGNOSIS — I110 Hypertensive heart disease with heart failure: Secondary | ICD-10-CM | POA: Diagnosis present

## 2020-09-23 DIAGNOSIS — E538 Deficiency of other specified B group vitamins: Secondary | ICD-10-CM | POA: Diagnosis present

## 2020-09-23 DIAGNOSIS — R0602 Shortness of breath: Secondary | ICD-10-CM | POA: Diagnosis not present

## 2020-09-23 DIAGNOSIS — Z803 Family history of malignant neoplasm of breast: Secondary | ICD-10-CM | POA: Diagnosis not present

## 2020-09-23 DIAGNOSIS — J9601 Acute respiratory failure with hypoxia: Secondary | ICD-10-CM | POA: Diagnosis present

## 2020-09-23 DIAGNOSIS — D649 Anemia, unspecified: Secondary | ICD-10-CM | POA: Diagnosis present

## 2020-09-23 DIAGNOSIS — Z79890 Hormone replacement therapy (postmenopausal): Secondary | ICD-10-CM | POA: Diagnosis not present

## 2020-09-23 DIAGNOSIS — Z800 Family history of malignant neoplasm of digestive organs: Secondary | ICD-10-CM | POA: Diagnosis not present

## 2020-09-23 DIAGNOSIS — E559 Vitamin D deficiency, unspecified: Secondary | ICD-10-CM | POA: Diagnosis present

## 2020-09-23 DIAGNOSIS — I509 Heart failure, unspecified: Secondary | ICD-10-CM

## 2020-09-23 DIAGNOSIS — E039 Hypothyroidism, unspecified: Secondary | ICD-10-CM

## 2020-09-23 DIAGNOSIS — Z7982 Long term (current) use of aspirin: Secondary | ICD-10-CM | POA: Diagnosis not present

## 2020-09-23 DIAGNOSIS — R600 Localized edema: Secondary | ICD-10-CM | POA: Diagnosis not present

## 2020-09-23 DIAGNOSIS — R609 Edema, unspecified: Secondary | ICD-10-CM | POA: Diagnosis not present

## 2020-09-23 DIAGNOSIS — Z20822 Contact with and (suspected) exposure to covid-19: Secondary | ICD-10-CM | POA: Diagnosis present

## 2020-09-23 DIAGNOSIS — E785 Hyperlipidemia, unspecified: Secondary | ICD-10-CM | POA: Diagnosis present

## 2020-09-23 DIAGNOSIS — Z8249 Family history of ischemic heart disease and other diseases of the circulatory system: Secondary | ICD-10-CM | POA: Diagnosis not present

## 2020-09-23 LAB — BASIC METABOLIC PANEL
Anion gap: 10 (ref 5–15)
BUN: 15 mg/dL (ref 8–23)
CO2: 25 mmol/L (ref 22–32)
Calcium: 8.9 mg/dL (ref 8.9–10.3)
Chloride: 100 mmol/L (ref 98–111)
Creatinine, Ser: 0.85 mg/dL (ref 0.44–1.00)
GFR, Estimated: >60 mL/min (ref >60)
Glucose, Bld: 138 mg/dL — ABNORMAL HIGH (ref 70–99)
Potassium: 3.9 mmol/L (ref 3.5–5.1)
Sodium: 135 mmol/L (ref 135–145)

## 2020-09-23 LAB — CBC
HCT: 23.3 % — ABNORMAL LOW (ref 36.0–46.0)
Hemoglobin: 7.9 g/dL — ABNORMAL LOW (ref 12.0–15.0)
MCH: 33.1 pg (ref 26.0–34.0)
MCHC: 33.9 g/dL (ref 30.0–36.0)
MCV: 97.5 fL (ref 80.0–100.0)
Platelets: 194 10*3/uL (ref 150–400)
RBC: 2.39 MIL/uL — ABNORMAL LOW (ref 3.87–5.11)
RDW: 12.8 % (ref 11.5–15.5)
WBC: 3.8 10*3/uL — ABNORMAL LOW (ref 4.0–10.5)
nRBC: 0 % (ref 0.0–0.2)

## 2020-09-23 LAB — PROCALCITONIN: Procalcitonin: <0.1 ng/mL

## 2020-09-23 LAB — TROPONIN I (HIGH SENSITIVITY)
Troponin I (High Sensitivity): 5 ng/L (ref <18)
Troponin I (High Sensitivity): 5 ng/L (ref <18)

## 2020-09-23 LAB — RESP PANEL BY RT-PCR (FLU A&B, COVID) ARPGX2
Influenza A by PCR: NEGATIVE
Influenza B by PCR: NEGATIVE
SARS Coronavirus 2 by RT PCR: NEGATIVE

## 2020-09-23 LAB — BRAIN NATRIURETIC PEPTIDE: B Natriuretic Peptide: 418.5 pg/mL — ABNORMAL HIGH (ref 0.0–100.0)

## 2020-09-23 LAB — LACTIC ACID, PLASMA
Lactic Acid, Venous: 0.6 mmol/L (ref 0.5–1.9)
Lactic Acid, Venous: 0.9 mmol/L (ref 0.5–1.9)

## 2020-09-23 MED ORDER — FUROSEMIDE 10 MG/ML IJ SOLN
40.0000 mg | Freq: Once | INTRAMUSCULAR | Status: AC
  Administered 2020-09-23: 40 mg via INTRAVENOUS
  Filled 2020-09-23: qty 4

## 2020-09-23 MED ORDER — LEVOTHYROXINE SODIUM 50 MCG PO TABS
150.0000 ug | ORAL_TABLET | Freq: Every day | ORAL | Status: DC
  Administered 2020-09-23 – 2020-09-26 (×4): 150 ug via ORAL
  Filled 2020-09-23 (×2): qty 3
  Filled 2020-09-23 (×2): qty 1

## 2020-09-23 MED ORDER — LABETALOL HCL 200 MG PO TABS
200.0000 mg | ORAL_TABLET | Freq: Once | ORAL | Status: AC
  Administered 2020-09-23: 200 mg via ORAL
  Filled 2020-09-23: qty 1

## 2020-09-23 MED ORDER — METHYLPREDNISOLONE SODIUM SUCC 40 MG IJ SOLR
40.0000 mg | Freq: Four times a day (QID) | INTRAMUSCULAR | Status: AC
  Administered 2020-09-23 (×4): 40 mg via INTRAVENOUS
  Filled 2020-09-23 (×4): qty 1

## 2020-09-23 MED ORDER — LABETALOL HCL 200 MG PO TABS
200.0000 mg | ORAL_TABLET | Freq: Two times a day (BID) | ORAL | Status: DC
  Administered 2020-09-23: 200 mg via ORAL
  Filled 2020-09-23 (×3): qty 1

## 2020-09-23 MED ORDER — MAGNESIUM HYDROXIDE 400 MG/5ML PO SUSP
30.0000 mL | Freq: Every day | ORAL | Status: DC | PRN
  Filled 2020-09-23: qty 30

## 2020-09-23 MED ORDER — IPRATROPIUM-ALBUTEROL 0.5-2.5 (3) MG/3ML IN SOLN
3.0000 mL | RESPIRATORY_TRACT | Status: DC | PRN
  Administered 2020-09-23: 3 mL via RESPIRATORY_TRACT

## 2020-09-23 MED ORDER — ONDANSETRON HCL 4 MG/2ML IJ SOLN: 4.0000 mg | Freq: Four times a day (QID) | INTRAMUSCULAR | Status: DC | PRN

## 2020-09-23 MED ORDER — PREDNISONE 20 MG PO TABS
40.0000 mg | ORAL_TABLET | Freq: Every day | ORAL | Status: DC
  Administered 2020-09-24 – 2020-09-26 (×3): 40 mg via ORAL
  Filled 2020-09-23 (×3): qty 2

## 2020-09-23 MED ORDER — IPRATROPIUM-ALBUTEROL 0.5-2.5 (3) MG/3ML IN SOLN
3.0000 mL | Freq: Four times a day (QID) | RESPIRATORY_TRACT | Status: DC
  Administered 2020-09-23 – 2020-09-25 (×7): 3 mL via RESPIRATORY_TRACT
  Filled 2020-09-23 (×8): qty 3

## 2020-09-23 MED ORDER — ALBUTEROL SULFATE (2.5 MG/3ML) 0.083% IN NEBU
2.5000 mg | INHALATION_SOLUTION | RESPIRATORY_TRACT | Status: DC | PRN
  Administered 2020-09-24: 2.5 mg via RESPIRATORY_TRACT
  Filled 2020-09-23: qty 3

## 2020-09-23 MED ORDER — SODIUM CHLORIDE 0.9 % IV SOLN: INTRAVENOUS | Status: DC

## 2020-09-23 MED ORDER — NORTRIPTYLINE HCL 25 MG PO CAPS
75.0000 mg | ORAL_CAPSULE | Freq: Every day | ORAL | Status: DC
  Administered 2020-09-23 – 2020-09-25 (×3): 75 mg via ORAL
  Filled 2020-09-23 (×4): qty 3

## 2020-09-23 MED ORDER — PRAVASTATIN SODIUM 20 MG PO TABS
20.0000 mg | ORAL_TABLET | Freq: Every day | ORAL | Status: DC
  Administered 2020-09-23 – 2020-09-25 (×3): 20 mg via ORAL
  Filled 2020-09-23 (×4): qty 1

## 2020-09-23 MED ORDER — VITAMIN B-12 1000 MCG PO TABS
1000.0000 ug | ORAL_TABLET | Freq: Every day | ORAL | Status: DC
  Administered 2020-09-23 – 2020-09-26 (×3): 1000 ug via ORAL
  Filled 2020-09-23 (×6): qty 1

## 2020-09-23 MED ORDER — TRAZODONE HCL 50 MG PO TABS
25.0000 mg | ORAL_TABLET | Freq: Every evening | ORAL | Status: DC | PRN
  Administered 2020-09-23: 25 mg via ORAL
  Filled 2020-09-23: qty 1

## 2020-09-23 MED ORDER — ENOXAPARIN SODIUM 40 MG/0.4ML IJ SOSY
40.0000 mg | PREFILLED_SYRINGE | INTRAMUSCULAR | Status: DC
  Administered 2020-09-23 – 2020-09-26 (×4): 40 mg via SUBCUTANEOUS
  Filled 2020-09-23 (×4): qty 0.4

## 2020-09-23 MED ORDER — ASPIRIN EC 81 MG PO TBEC
81.0000 mg | DELAYED_RELEASE_TABLET | Freq: Every day | ORAL | Status: DC
  Administered 2020-09-23 – 2020-09-26 (×4): 81 mg via ORAL
  Filled 2020-09-23 (×4): qty 1

## 2020-09-23 MED ORDER — IPRATROPIUM-ALBUTEROL 0.5-2.5 (3) MG/3ML IN SOLN
3.0000 mL | Freq: Once | RESPIRATORY_TRACT | Status: AC
Start: 2020-09-23 — End: 2020-09-23
  Administered 2020-09-23: 3 mL via RESPIRATORY_TRACT
  Filled 2020-09-23: qty 3

## 2020-09-23 MED ORDER — ONDANSETRON HCL 4 MG PO TABS: 4.0000 mg | ORAL_TABLET | Freq: Four times a day (QID) | ORAL | Status: DC | PRN

## 2020-09-23 MED ORDER — POLYETHYLENE GLYCOL 3350 17 GM/SCOOP PO POWD: 17.0000 g | Freq: Every day | ORAL | Status: DC

## 2020-09-23 MED ORDER — POLYETHYLENE GLYCOL 3350 17 G PO PACK
17.0000 g | PACK | Freq: Every day | ORAL | Status: DC
  Administered 2020-09-24 – 2020-09-26 (×3): 17 g via ORAL
  Filled 2020-09-23 (×5): qty 1

## 2020-09-23 MED ORDER — QUETIAPINE FUMARATE 100 MG PO TABS
100.0000 mg | ORAL_TABLET | Freq: Every day | ORAL | Status: DC
  Administered 2020-09-23 – 2020-09-25 (×3): 100 mg via ORAL
  Filled 2020-09-23: qty 1
  Filled 2020-09-23: qty 4
  Filled 2020-09-23: qty 1
  Filled 2020-09-23: qty 4
  Filled 2020-09-23: qty 1

## 2020-09-23 MED ORDER — ACETAMINOPHEN 650 MG RE SUPP
650.0000 mg | Freq: Four times a day (QID) | RECTAL | Status: DC | PRN
  Filled 2020-09-23: qty 1

## 2020-09-23 MED ORDER — FUROSEMIDE 10 MG/ML IJ SOLN
40.0000 mg | Freq: Two times a day (BID) | INTRAMUSCULAR | Status: DC
  Administered 2020-09-23 – 2020-09-24 (×3): 40 mg via INTRAVENOUS
  Filled 2020-09-23 (×3): qty 4

## 2020-09-23 MED ORDER — FUROSEMIDE 10 MG/ML IJ SOLN: 40.0000 mg | Freq: Two times a day (BID) | INTRAMUSCULAR | Status: DC

## 2020-09-23 MED ORDER — CITALOPRAM HYDROBROMIDE 20 MG PO TABS
10.0000 mg | ORAL_TABLET | Freq: Every day | ORAL | Status: DC
  Administered 2020-09-23 – 2020-09-26 (×4): 10 mg via ORAL
  Filled 2020-09-23 (×4): qty 1

## 2020-09-23 MED ORDER — HYDROXYZINE HCL 10 MG PO TABS
25.0000 mg | ORAL_TABLET | Freq: Every day | ORAL | Status: DC | PRN
  Filled 2020-09-23: qty 3

## 2020-09-23 MED ORDER — ACETAMINOPHEN 325 MG PO TABS: 650.0000 mg | ORAL_TABLET | Freq: Four times a day (QID) | ORAL | Status: DC | PRN

## 2020-09-23 NOTE — ED Provider Notes (Signed)
-----------------------------------------   12:56 AM on 09/23/2020 -----------------------------------------  Patient remains tachypneic and diminished.  Patient received 2 nebulizer treatments and 125 mg IV Solu-Medrol by EMS.  Will administer third DuoNeb and discuss with hospital services for admission.   Paulette Blanch, MD 09/23/20 [phone]

## 2020-09-23 NOTE — ED Notes (Signed)
Pt morning medications was delayed due to pharmacy unable to verify medications . Message sent to pharmacy

## 2020-09-23 NOTE — Progress Notes (Addendum)
PROGRESS NOTE    AMANTHA Ochoa  KDT:[id number] DOB: [birth year] DOA: 09/22/2020 PCP: Virginia Crews, MD  Chief Complaint  Patient presents with   Shortness of Breath    Brief Narrative:  Wanda Ochoa is Wanda Ochoa 75 y.o. Caucasian female with medical history significant for asthma, depression and anxiety, hypertension and hypothyroidism, presented to the emergency room with acute onset of worsening dyspnea with associated cough and wheezing for the last couple days, for which she received Kayal Mula 2 nebulized DuoNebs and IV Solu-Medrol by EMS on route to the hospital.  She denied any fever or chills.  No nausea or vomiting or abdominal pain.  No chest pain or palpitations.  She was fairly somnolent but arousable.  No dysuria, oliguria or hematuria or flank pain. ED Course: Upon presentation to the emergency room blood pressure was 148/65 with Meshach Perry respiratory rate of 22 with otherwise normal vital signs.  The patient pulse oximetry was initially 80% on room air and came up to 93% on 3 to 4 L of O2 by nasal cannula.  Labs revealed mild hyponatremia and Angelene Rome BNP of 418.5.  High-sensitivity troponin I was 5 twice.  Lactic acid was 0.6.  CBC showed anemia at her baseline.  Influenza antigens and COVID-19 PCR came back negative.  Blood cultures were drawn. EKG as reviewed by me : Showed normal sinus rhythm with Deserae Jennings rate of 65 and prolonged QT interval. Imaging: 2 view chest x-ray showed the following: 1. Mild pulmonary edema with bilateral trace pleural effusions. 2. Left base/retrocardiac opacity. Finding could represent infection/inflammation versus atelectasis. Followup PA and lateral chest X-ray is recommended in 3-4 weeks following therapy to ensure resolution and exclude underlying malignancy. 3.  Aortic Atherosclerosis.  The patient was given another DuoNeb here with an albuterol MDI as well.  She will be admitted to [facility] medical monitored bed for further evaluation and management.   Assessment &  Plan:   Active Problems:   Acute asthma exacerbation  Acute Hypoxic Respiratory Failure 2/2 Heart Failure Exacerbation and Asthma Exacerbation No longer wheezing on exam for me today But exam concerning for volume overload CR with mild pulm edema with bilateral trace effusions  No fever or significant leukocytosis, hold off on abx Lasix 40 mg BID Steroids.  Scheduled and prn nebs.   Strict I/O, daily weights Echo pending read LE [place] negative for DVT  L base retrocardiac opacity Follow CXR in 3-4 weeks No clear signs of infection, follow  Hypothyroidism Synthroid  HTN Labetalol  Dyslipidemia Statin  B12 def B12  Depression Celexa, seroquel, pamelor  Follow vit d per son request  DVT prophylaxis: lovenox Code Status: full Family Communication: son Disposition:   Status is: Inpatient  Remains inpatient appropriate because:Inpatient level of care appropriate due to severity of illness  Dispo: The patient is from: Home              Anticipated d/c is to: Home              Patient currently is not medically stable to d/c.   Difficult to place patient No       Consultants:  none  Procedures: Echo pending  LE US IMPRESSION: Negative.  Antimicrobials:  Anti-infectives (From admission, onward)    None          Subjective: Feels Wanda Ochoa little better  Objective: Vitals:   09/23/20 1200 09/23/20 1227 09/23/20 1430 09/23/20 1500  BP: (!) 160/74 (!) 167/80 (!) 150/70 138/66  Pulse:  77 88 83 79  Resp: (!) 22  18 18   Temp:    98 F (36.7 C)  TempSrc:    Oral  SpO2: 98%  94%   Weight:      Height:       No intake or output data in the 24 hours ending 09/23/20 1713 Filed Weights   09/22/20 2257  Weight: 78 kg    Examination:  General exam: Appears calm and comfortable  Respiratory system: Clear to auscultation. Respiratory effort normal. Cardiovascular system: S1 & S2 heard, RRR.  Gastrointestinal system: Abdomen is nondistended, soft and  nontender Central nervous system: Alert and oriented. No focal neurological deficits. Extremities: bilateral lower extremity edema Skin: No rashes, lesions or ulcers Psychiatry: Judgement and insight appear normal. Mood & affect appropriate.     Data Reviewed: I have personally reviewed following labs and imaging studies  CBC: Recent Labs  Lab 09/22/20 2259 09/23/20 0607  WBC 8.7 3.8*  HGB 8.4* 7.9*  HCT 25.2* 23.3*  MCV 99.6 97.5  PLT 200 749    Basic Metabolic Panel: Recent Labs  Lab 09/22/20 2259 09/23/20 0832  NA 133* 135  K 4.0 3.9  CL 99 100  CO2 25 25  GLUCOSE 112* 138*  BUN 16 15  CREATININE 0.91 0.85  CALCIUM 8.7* 8.9    GFR: Estimated Creatinine Clearance: 60.7 mL/min (by C-G formula based on SCr of 0.85 mg/dL).  Liver Function Tests: No results for input(s): AST, ALT, ALKPHOS, BILITOT, PROT, ALBUMIN in the last 168 hours.  CBG: No results for input(s): GLUCAP in the last 168 hours.   Recent Results (from the past 240 hour(s))  Resp Panel by RT-PCR (Flu Nicolemarie Wooley&B, Covid) Nasopharyngeal Swab     Status: None   Collection Time: 09/23/20 12:32 AM   Specimen: Nasopharyngeal Swab; Nasopharyngeal(NP) swabs in vial transport medium  Result Value Ref Range Status   SARS Coronavirus 2 by RT PCR NEGATIVE NEGATIVE Final    Comment: (NOTE) SARS-CoV-2 target nucleic acids are NOT DETECTED.  The SARS-CoV-2 RNA is generally detectable in upper respiratory specimens during the acute phase of infection. The lowest concentration of SARS-CoV-2 viral copies this assay can detect is 138 copies/mL. Lakota Markgraf negative result does not preclude SARS-Cov-2 infection and should not be used as the sole basis for treatment or other patient management decisions. Abeni Finchum negative result may occur with  improper specimen collection/handling, submission of specimen other than nasopharyngeal swab, presence of viral mutation(s) within the areas targeted by this assay, and inadequate number of  viral copies(<138 copies/mL). Carmichael Burdette negative result must be combined with clinical observations, patient history, and epidemiological information. The expected result is Negative.  Fact Sheet for Patients:  EntrepreneurPulse.com.au  Fact Sheet for Healthcare Providers:  IncredibleEmployment.be  This test is no t yet approved or cleared by the [place] FDA and  has been authorized for detection and/or diagnosis of SARS-CoV-2 by FDA under an Emergency Use Authorization (EUA). This EUA will remain  in effect (meaning this test can be used) for the duration of the COVID-19 declaration under Section 564(b)(1) of the Act, 21 U.S.C.section 360bbb-3(b)(1), unless the authorization is terminated  or revoked sooner.       Influenza Elizardo Chilson by PCR NEGATIVE NEGATIVE Final   Influenza B by PCR NEGATIVE NEGATIVE Final    Comment: (NOTE) The Xpert Xpress SARS-CoV-2/FLU/RSV plus assay is intended as an aid in the diagnosis of influenza from Nasopharyngeal swab specimens and should not be used as Delos Klich sole  basis for treatment. Nasal washings and aspirates are unacceptable for Xpert Xpress SARS-CoV-2/FLU/RSV testing.  Fact Sheet for Patients: EntrepreneurPulse.com.au  Fact Sheet for Healthcare Providers: IncredibleEmployment.be  This test is not yet approved or cleared by the [place] FDA and has been authorized for detection and/or diagnosis of SARS-CoV-2 by FDA under an Emergency Use Authorization (EUA). This EUA will remain in effect (meaning this test can be used) for the duration of the COVID-19 declaration under Section 564(b)(1) of the Act, 21 U.S.C. section 360bbb-3(b)(1), unless the authorization is terminated or revoked.  Performed at [facility], [address] [place], [place] [address]   Culture, blood (routine x 2)     Status: None (Preliminary result)   Collection Time: 09/23/20  1:27 AM    Specimen: BLOOD  Result Value Ref Range Status   Specimen Description BLOOD RIGHT HAND  Final   Special Requests   Final    BOTTLES DRAWN AEROBIC AND ANAEROBIC Blood Culture adequate volume   Culture   Final    NO GROWTH < 12 HOURS Performed at [facility], [address], [place], [place] [address]    Report Status PENDING  Incomplete  Culture, blood (routine x 2)     Status: None (Preliminary result)   Collection Time: 09/23/20  1:27 AM   Specimen: BLOOD  Result Value Ref Range Status   Specimen Description BLOOD LEFT ANTECUBITAL  Final   Special Requests   Final    BOTTLES DRAWN AEROBIC AND ANAEROBIC Blood Culture adequate volume   Culture   Final    NO GROWTH < 12 HOURS Performed at [facility], [address] [place], [place] [address]    Report Status PENDING  Incomplete         Radiology Studies: DG Chest 2 View  Result Date: 09/22/2020 CLINICAL DATA:  Shortness of breath. EXAM: CHEST - 2 VIEW COMPARISON:  Chest x-ray 08/29/2020 FINDINGS: The heart size and mediastinal contours are unchanged. Aortic calcification. Prominent hilar vasculature. Left base/retrocardiac opacity. Interval development of diffusely increased interstitial markings. Interval development of bilateral trace pleural effusions. No pneumothorax. No acute osseous abnormality. IMPRESSION: 1. Mild pulmonary edema with bilateral trace pleural effusions. 2. Left base/retrocardiac opacity. Finding could represent infection/inflammation versus atelectasis. Followup PA and lateral chest X-ray is recommended in 3-4 weeks following therapy to ensure resolution and exclude underlying malignancy. 3.  Aortic Atherosclerosis (ICD10-I70.0). Electronically Signed   By: Iven Finn M.D.   On: 09/22/2020 23:45   US Venous Img Lower Bilateral (DVT)  Result Date: 09/23/2020 CLINICAL DATA:  Evaluated edema.  Rule out DVT. EXAM: BILATERAL LOWER EXTREMITY VENOUS DOPPLER ULTRASOUND TECHNIQUE: Gray-scale  sonography with compression, as well as color and duplex ultrasound, were performed to evaluate the deep venous system(s) from the level of the common femoral vein through the popliteal and proximal calf veins. COMPARISON:  None. FINDINGS: VENOUS Normal compressibility of the common femoral, superficial femoral, and popliteal veins, as well as the visualized calf veins. Visualized portions of profunda femoral vein and great saphenous vein unremarkable. No filling defects to suggest DVT on grayscale or color Doppler imaging. Doppler waveforms show normal direction of venous flow, normal respiratory plasticity and response to augmentation. Limited views of the contralateral common femoral vein are unremarkable. OTHER None. Limitations: Suboptimal visualization of the left peroneal vein. IMPRESSION: Negative. Electronically Signed   By: Kerby Moors M.D.   On: 09/23/2020 09:48        Scheduled Meds:  aspirin  EC  81 mg Oral Daily   citalopram  10 mg Oral Daily   enoxaparin (LOVENOX) injection  40 mg Subcutaneous Q24H   furosemide  40 mg Intravenous BID   furosemide  40 mg Intravenous Once   ipratropium-albuterol  3 mL Nebulization QID   labetalol  200 mg Oral BID   levothyroxine  150 mcg Oral Daily   methylPREDNISolone (SOLU-MEDROL) injection  40 mg Intravenous Q6H   Followed by   Derrill Memo ON 09/24/2020] predniSONE  40 mg Oral Q breakfast   nortriptyline  75 mg Oral QHS   polyethylene glycol  17 g Oral Daily   pravastatin  20 mg Oral QHS   QUEtiapine  100 mg Oral QHS   vitamin B-12  1,000 mcg Oral Daily   Continuous Infusions:   LOS: 0 days    Time spent: over 30 min    Fayrene Helper, MD [facility]   To contact the attending provider between 7A-7P or the covering provider during after hours 7P-7A, please log into the web site [email or web address] and access using universal [place] password for that web site. If you do not have the password, please call the hospital  operator.  09/23/2020, 5:13 PM

## 2020-09-23 NOTE — Progress Notes (Signed)
*  PRELIMINARY RESULTS* Echocardiogram 2D Echocardiogram has been performed.  Wanda Ochoa 09/23/2020, 10:31 AM

## 2020-09-23 NOTE — ED Notes (Addendum)
Patient gives verbal consent to provide any/all medical information to either of her son's [name] [phone] or [name] [phone].  Attending provider, Dr Florene Glen, will call and update [name] who was present in ED earlier this morning.

## 2020-09-23 NOTE — H&P (Signed)
[place]   PATIENT NAME: Wanda Ochoa    MR#:  [id number]  DATE OF BIRTH:  [birth year]  DATE OF ADMISSION:  09/22/2020  PRIMARY CARE PHYSICIAN: Virginia Crews, MD   Patient is coming from: Home  REQUESTING/REFERRING PHYSICIAN: Lurline Hare, MD  CHIEF COMPLAINT:   Chief Complaint  Patient presents with   Shortness of Breath    HISTORY OF PRESENT ILLNESS:  Wanda Ochoa is a 75 y.o. Caucasian female with medical history significant for asthma, depression and anxiety, hypertension and hypothyroidism, presented to the emergency room with acute onset of worsening dyspnea with associated cough and wheezing for the last couple days, for which she received a 2 nebulized DuoNebs and IV Solu-Medrol by EMS on route to the hospital.  She denied any fever or chills.  No nausea or vomiting or abdominal pain.  No chest pain or palpitations.  She was fairly somnolent but arousable.  No dysuria, oliguria or hematuria or flank pain. ED Course: Upon presentation to the emergency room blood pressure was 148/65 with a respiratory rate of 22 with otherwise normal vital signs.  The patient pulse oximetry was initially 80% on room air and came up to 93% on 3 to 4 L of O2 by nasal cannula.  Labs revealed mild hyponatremia and a BNP of 418.5.  High-sensitivity troponin I was 5 twice.  Lactic acid was 0.6.  CBC showed anemia at her baseline.  Influenza antigens and COVID-19 PCR came back negative.  Blood cultures were drawn. EKG as reviewed by me : Showed normal sinus rhythm with a rate of 65 and prolonged QT interval. Imaging: 2 view chest x-ray showed the following: 1. Mild pulmonary edema with bilateral trace pleural effusions. 2. Left base/retrocardiac opacity. Finding could represent infection/inflammation versus atelectasis. Followup PA and lateral chest X-ray is recommended in 3-4 weeks following therapy to ensure resolution and exclude underlying malignancy. 3.  Aortic  Atherosclerosis.  The patient was given another DuoNeb here with an albuterol MDI as well.  She will be admitted to a medical monitored bed for further evaluation and management.  PAST MEDICAL HISTORY:   Past Medical History:  Diagnosis Date   Anxiety    Arthritis    right knee   Asthma    Depression    catatonic depression per pt report   HTN (hypertension)    Thyroid disease     PAST SURGICAL HISTORY:   Past Surgical History:  Procedure Laterality Date   ABDOMINAL HYSTERECTOMY  1990   BLADDER SUSPENSION  1990   BREAST CYST ASPIRATION Right 1985   BREAST EXCISIONAL BIOPSY Right 1967   EXCISIONAL BX - NEG   BREAST SURGERY Right 1967   Lumpectomy   CESAREAN SECTION     COLONOSCOPY WITH PROPOFOL N/A 12/18/2018   Procedure: COLONOSCOPY WITH PROPOFOL;  Surgeon: Lollie Sails, MD;  Location: [facility]COPY;  Service: Endoscopy;  Laterality: N/A;   HEMORRHOIDECTOMY WITH HEMORRHOID BANDING  1988    SOCIAL HISTORY:   Social History   Tobacco Use   Smoking status: Never   Smokeless tobacco: Never  Substance Use Topics   Alcohol use: No    FAMILY HISTORY:   Family History  Problem Relation Age of Onset   Depression Father    Suicidality Father    Depression Sister    Breast cancer Sister 66   Autism Son    Heart disease Mother    Colon cancer Mother    Hypertension  Mother    Anxiety disorder Mother    Mental illness Daughter    Allergies Brother    Allergies Sister    Asthma Maternal Uncle    Heart disease Maternal Grandfather    Heart disease Maternal Grandmother    Breast cancer Paternal Grandmother    Breast cancer Other     DRUG ALLERGIES:   Allergies  Allergen Reactions   Penicillins Anaphylaxis   Lipitor [Atorvastatin] Rash    Severe rash on skin after taking approximately 2 weeks.     REVIEW OF SYSTEMS:   ROS As per history of present illness. All pertinent systems were reviewed above. Constitutional, HEENT, cardiovascular,  respiratory, GI, GU, musculoskeletal, neuro, psychiatric, endocrine, integumentary and hematologic systems were reviewed and are otherwise negative/unremarkable except for positive findings mentioned above in the HPI.   MEDICATIONS AT HOME:   Prior to Admission medications   Medication Sig Start Date End Date Taking? Authorizing Provider  cyanocobalamin (,VITAMIN B-12,) 1000 MCG/ML injection Inject 1 mL into the muscle every 30 (thirty) days. 09/07/20 09/29/20 Yes [provider]  albuterol (VENTOLIN HFA) 108 (90 Base) MCG/ACT inhaler INHALE 2 PUFFS INTO THE LUNGS EVERY 6 HOURS AS NEEDED 08/10/19   Bacigalupo, Dionne Bucy, MD  aspirin 81 MG tablet Take 81 mg by mouth daily.    [provider]  citalopram (CELEXA) 10 MG tablet Take 10 mg by mouth daily. 08/08/19   [provider]  cyanocobalamin (,VITAMIN B-12,) 1000 MCG/ML injection Inject 1 mL (1,000 mcg total) into the muscle once a week for 4 doses. 09/07/20 09/29/20  Jennye Boroughs, MD  hydrOXYzine (VISTARIL) 25 MG capsule Take 25 mg by mouth daily as needed for anxiety. 07/03/20   [provider]  labetalol (NORMODYNE) 200 MG tablet Take 1 tablet (200 mg total) by mouth 2 (two) times daily. 09/04/20   Jennye Boroughs, MD  levothyroxine (SYNTHROID) 150 MCG tablet Take 1 tablet (150 mcg total) by mouth daily. 09/04/20   Jennye Boroughs, MD  nortriptyline (PAMELOR) 25 MG capsule Take 3 capsule at bed time 09/04/20   Jennye Boroughs, MD  polyethylene glycol (MIRALAX / GLYCOLAX) 17 g packet Take 17 g by mouth daily. Mix in 4-8 oz of fluid    [provider]  polyethylene glycol powder (GLYCOLAX/MIRALAX) 17 GM/SCOOP powder Take 17 g by mouth daily.    [provider]  pravastatin (PRAVACHOL) 20 MG tablet Take 20 mg by mouth daily at 12 noon. 07/23/20   [provider]  QUEtiapine (SEROQUEL) 100 MG tablet Take 100 mg by mouth at bedtime.    [provider]  vitamin B-12 (CYANOCOBALAMIN) 1000 MCG  tablet Take 1 tablet (1,000 mcg total) by mouth daily. 09/30/20   Jennye Boroughs, MD      VITAL SIGNS:  Blood pressure (!) 166/83, pulse 63, temperature 98 F (36.7 C), temperature source Oral, resp. rate 18, height 5\' 9"  (1.753 m), weight 78 kg, SpO2 97 %.  PHYSICAL EXAMINATION:  Physical Exam  GENERAL:  75 y.o.-year-old Caucasian female patient lying in the bed with mild respiratory/with conversational dyspnea. EYES: Pupils equal, round, reactive to light and accommodation. No scleral icterus. Extraocular muscles intact.  HEENT: Head atraumatic, normocephalic. Oropharynx and nasopharynx clear.  NECK:  Supple, no jugular venous distention. No thyroid enlargement, no tenderness.  LUNGS: Diminished bibasal breath sounds with bibasal rales as well as expiratory wheezes with diminished expiratory airflow at the heart vesicular breathing. CARDIOVASCULAR: Regular rate and rhythm, S1, S2 normal. No murmurs,  rubs, or gallops.  ABDOMEN: Soft, nondistended, nontender. Bowel sounds present. No organomegaly or mass.  EXTREMITIES: No pedal edema, cyanosis, or clubbing.  NEUROLOGIC: Cranial nerves II through XII are intact. Muscle strength 5/5 in all extremities. Sensation intact. Gait not checked.  PSYCHIATRIC: The patient is alert and oriented x 3.  Normal affect and good eye contact. SKIN: No obvious rash, lesion, or ulcer.   LABORATORY PANEL:   CBC Recent Labs  Lab 09/22/20 2259  WBC 8.7  HGB 8.4*  HCT 25.2*  PLT 200   ------------------------------------------------------------------------------------------------------------------  Chemistries  Recent Labs  Lab 09/22/20 2259  NA 133*  K 4.0  CL 99  CO2 25  GLUCOSE 112*  BUN 16  CREATININE 0.91  CALCIUM 8.7*   ------------------------------------------------------------------------------------------------------------------  Cardiac Enzymes No results for input(s): TROPONINI in the last 168  hours. ------------------------------------------------------------------------------------------------------------------  RADIOLOGY:  DG Chest 2 View  Result Date: 09/22/2020 CLINICAL DATA:  Shortness of breath. EXAM: CHEST - 2 VIEW COMPARISON:  Chest x-ray 08/29/2020 FINDINGS: The heart size and mediastinal contours are unchanged. Aortic calcification. Prominent hilar vasculature. Left base/retrocardiac opacity. Interval development of diffusely increased interstitial markings. Interval development of bilateral trace pleural effusions. No pneumothorax. No acute osseous abnormality. IMPRESSION: 1. Mild pulmonary edema with bilateral trace pleural effusions. 2. Left base/retrocardiac opacity. Finding could represent infection/inflammation versus atelectasis. Followup PA and lateral chest X-ray is recommended in 3-4 weeks following therapy to ensure resolution and exclude underlying malignancy. 3.  Aortic Atherosclerosis (ICD10-I70.0). Electronically Signed   By: Iven Finn M.D.   On: 09/22/2020 23:45      IMPRESSION AND PLAN:  Active Problems:   Acute asthma exacerbation  1.  Acute asthma exacerbation with associated possible cor pulmonale with pulmonary edema and acute CHF likely diastolic and subsequent acute hypoxic respiratory failure- The patient was admitted to a medical monitored bed. - We will continue steroid therapy with IV Solu-Medrol and bronchodilator therapy with DuoNebs 4 times daily and every 4 hours as needed. - Mucolytic therapy will be provided. -We will follow blood culture. - The patient will be diuresed with IV Lasix. - We will obtain 2D echo. - O2 protocol will be followed.  2.  Hypothyroidism. - We will continue Synthroid.  3.  Essential hypertension. - We will continue labetalol.  4.  Dyslipidemia. - We will continue statin therapy.  5.  Vitamin B12 deficiency. - We will continue vitamin B12.  6.  Depression. - We will continue Celexa and Seroquel as  well as Pamelor.  - DVT prophylaxis: Lovenox.  Code Status: full code.  Family Communication:  The plan of care was discussed in details with the patient (and family). I answered all questions. The patient agreed to proceed with the above mentioned plan. Further management will depend upon hospital course. Disposition Plan: Back to previous home environment Consults called: none.  All the records are reviewed and case discussed with ED provider.  Status is: Inpatient  Remains inpatient appropriate because:Ongoing active pain requiring inpatient pain management, Ongoing diagnostic testing needed not appropriate for outpatient work up, Unsafe d/c plan, IV treatments appropriate due to intensity of illness or inability to take PO, and Inpatient level of care appropriate due to severity of illness  Dispo: The patient is from: Home              Anticipated d/c is to: Home              Patient currently is not medically stable to d/c.  Difficult to place patient No   TOTAL TIME TAKING CARE OF THIS PATIENT: 55 minutes.    Christel Mormon M.D on 09/23/2020 at 2:58 AM  [facility]   From 7 PM-7 AM, contact night-coverage [email or web address]  CC: Primary care physician; Virginia Crews, MD

## 2020-09-23 NOTE — ED Notes (Signed)
Pt resting in room. States she came in because of difficulty breathing but feels better. Pt on 2LPM of oxygen via [place]. Pt states she does not usually use oxygen at home.  Pt on pulse ox, cardiac monitor and bp monitor.

## 2020-09-23 NOTE — ED Notes (Signed)
Messaged attending provider Dr Florene Glen per pt family request, family member presents at nursing desk asking to "speak with attending physician".

## 2020-09-23 NOTE — ED Notes (Signed)
Pt assisted to bedside commode and back to bed, requires standby assistance. Cardiac monitoring resumed. Stretcher locked in low position with side rails up x2. Personal belongings and call light in reach. She denies needs at this time, her family remains at the bedside.

## 2020-09-23 NOTE — ED Notes (Signed)
Pt given lunch tray. Pt's son sitting with pt. Pt denies needs at this time.

## 2020-09-24 ENCOUNTER — Other Ambulatory Visit: Payer: Self-pay

## 2020-09-24 LAB — CBC WITH DIFFERENTIAL/PLATELET
Abs Immature Granulocytes: 0.05 10*3/uL (ref 0.00–0.07)
Basophils Absolute: 0 10*3/uL (ref 0.0–0.1)
Basophils Relative: 0 %
Eosinophils Absolute: 0 10*3/uL (ref 0.0–0.5)
Eosinophils Relative: 0 %
HCT: 25.9 % — ABNORMAL LOW (ref 36.0–46.0)
Hemoglobin: 8.7 g/dL — ABNORMAL LOW (ref 12.0–15.0)
Immature Granulocytes: 1 %
Lymphocytes Relative: 13 %
Lymphs Abs: 1.2 10*3/uL (ref 0.7–4.0)
MCH: 33.3 pg (ref 26.0–34.0)
MCHC: 33.6 g/dL (ref 30.0–36.0)
MCV: 99.2 fL (ref 80.0–100.0)
Monocytes Absolute: 0.5 10*3/uL (ref 0.1–1.0)
Monocytes Relative: 5 %
Neutro Abs: 7.8 10*3/uL — ABNORMAL HIGH (ref 1.7–7.7)
Neutrophils Relative %: 81 %
Platelets: 235 10*3/uL (ref 150–400)
RBC: 2.61 MIL/uL — ABNORMAL LOW (ref 3.87–5.11)
RDW: 12.7 % (ref 11.5–15.5)
WBC: 9.6 10*3/uL (ref 4.0–10.5)
nRBC: 0 % (ref 0.0–0.2)

## 2020-09-24 LAB — PHOSPHORUS: Phosphorus: 4.9 mg/dL — ABNORMAL HIGH (ref 2.5–4.6)

## 2020-09-24 LAB — ECHOCARDIOGRAM COMPLETE
AR max vel: 2.48 cm2
AV Peak grad: 7.4 mmHg
Ao pk vel: 1.36 m/s
Area-P 1/2: 5.62 cm2
Height: 69 in
MV M vel: 6.24 m/s
MV Peak grad: 155.8 mmHg
Radius: 0.5 cm
S' Lateral: 3.15 cm
Weight: 2751.34 oz

## 2020-09-24 LAB — COMPREHENSIVE METABOLIC PANEL
ALT: 13 U/L (ref 0–44)
AST: 17 U/L (ref 15–41)
Albumin: 3.4 g/dL — ABNORMAL LOW (ref 3.5–5.0)
Alkaline Phosphatase: 112 U/L (ref 38–126)
Anion gap: 12 (ref 5–15)
BUN: 27 mg/dL — ABNORMAL HIGH (ref 8–23)
CO2: 27 mmol/L (ref 22–32)
Calcium: 8.7 mg/dL — ABNORMAL LOW (ref 8.9–10.3)
Chloride: 97 mmol/L — ABNORMAL LOW (ref 98–111)
Creatinine, Ser: 1.14 mg/dL — ABNORMAL HIGH (ref 0.44–1.00)
GFR, Estimated: 51 mL/min — ABNORMAL LOW (ref >60)
Glucose, Bld: 132 mg/dL — ABNORMAL HIGH (ref 70–99)
Potassium: 3.5 mmol/L (ref 3.5–5.1)
Sodium: 136 mmol/L (ref 135–145)
Total Bilirubin: 0.5 mg/dL (ref 0.3–1.2)
Total Protein: 7 g/dL (ref 6.5–8.1)

## 2020-09-24 LAB — VITAMIN D 25 HYDROXY (VIT D DEFICIENCY, FRACTURES): Vit D, 25-Hydroxy: 19.14 ng/mL — ABNORMAL LOW (ref 30–100)

## 2020-09-24 LAB — MAGNESIUM: Magnesium: 1.9 mg/dL (ref 1.7–2.4)

## 2020-09-24 MED ORDER — FUROSEMIDE 40 MG PO TABS
40.0000 mg | ORAL_TABLET | Freq: Every day | ORAL | Status: DC
  Administered 2020-09-25: 40 mg via ORAL
  Filled 2020-09-24: qty 1

## 2020-09-24 MED ORDER — LABETALOL HCL 200 MG PO TABS
200.0000 mg | ORAL_TABLET | Freq: Two times a day (BID) | ORAL | Status: DC
  Administered 2020-09-24 – 2020-09-26 (×4): 200 mg via ORAL
  Filled 2020-09-24 (×5): qty 1

## 2020-09-24 MED ORDER — POTASSIUM CHLORIDE CRYS ER 20 MEQ PO TBCR
40.0000 meq | EXTENDED_RELEASE_TABLET | Freq: Once | ORAL | Status: AC
  Administered 2020-09-25: 40 meq via ORAL
  Filled 2020-09-24: qty 2

## 2020-09-24 MED ORDER — PANTOPRAZOLE SODIUM 40 MG PO TBEC
40.0000 mg | DELAYED_RELEASE_TABLET | Freq: Every day | ORAL | Status: DC
  Administered 2020-09-25 – 2020-09-26 (×2): 40 mg via ORAL
  Filled 2020-09-24 (×2): qty 1

## 2020-09-24 NOTE — Progress Notes (Addendum)
PROGRESS NOTE    Wanda Ochoa  MWN:[id number] DOB: 10-10-1945 DOA: 09/22/2020 PCP: Virginia Crews, MD  Chief Complaint  Patient presents with   Shortness of Breath    Brief Narrative:  Wanda Ochoa is Wanda Ochoa 75 y.o. Caucasian female with medical history significant for asthma, depression and anxiety, hypertension and hypothyroidism, presented to the emergency room with acute onset of worsening dyspnea with associated cough and wheezing for the last couple days, for which she received Shakendra Griffeth 2 nebulized DuoNebs and IV Solu-Medrol by EMS on route to the hospital.  She denied any fever or chills.  No nausea or vomiting or abdominal pain.  No chest pain or palpitations.  She was fairly somnolent but arousable.  No dysuria, oliguria or hematuria or flank pain. ED Course: Upon presentation to the emergency room blood pressure was 148/65 with Johnny Gorter respiratory rate of 22 with otherwise normal vital signs.  The patient pulse oximetry was initially 80% on room air and came up to 93% on 3 to 4 L of O2 by nasal cannula.  Labs revealed mild hyponatremia and Azora Bonzo BNP of 418.5.  High-sensitivity troponin I was 5 twice.  Lactic acid was 0.6.  CBC showed anemia at her baseline.  Influenza antigens and COVID-19 PCR came back negative.  Blood cultures were drawn. EKG as reviewed by me : Showed normal sinus rhythm with Avyukt Cimo rate of 65 and prolonged QT interval. Imaging: 2 view chest x-ray showed the following: 1. Mild pulmonary edema with bilateral trace pleural effusions. 2. Left base/retrocardiac opacity. Finding could represent infection/inflammation versus atelectasis. Followup PA and lateral chest X-ray is recommended in 3-4 weeks following therapy to ensure resolution and exclude underlying malignancy. 3.  Aortic Atherosclerosis.  The patient was given another DuoNeb here with an albuterol MDI as well.  She will be [facility] monitored [facility] for further evaluation and management.   Assessment &  Plan:   Active Problems:   Shortness of breath   Acute asthma exacerbation  Acute Hypoxic Respiratory Failure 2/2 Heart Failure Exacerbation and Asthma Exacerbation Scattered wheezing today Edema gradually improving Wean o2 as tolerated - on 2 L this AM, weaned to RA this PM - possible d/c in AM CR with mild pulm edema with bilateral trace effusions  No fever or significant leukocytosis, hold off on abx Lasix 40 mg BID Steroids.  Scheduled and prn nebs.   Strict I/O, daily weights Echo with grade 2 diastolic dysfunction  LE [place] negative for DVT  L base retrocardiac opacity Follow CXR in 3-4 weeks No clear signs of infection, follow  Hypothyroidism Synthroid  HTN Labetalol  Dyslipidemia Statin  B12 def B12  Depression Celexa, seroquel, pamelor  Follow vit d per son request - pending   DVT prophylaxis: lovenox Code Status: full Family Communication: called son 7/3 [name]), no answer Disposition:   Status is: Inpatient  Remains inpatient appropriate because:Inpatient level of care appropriate due to severity of illness  Dispo: The patient is from: Home              Anticipated d/c is to: Home              Patient currently is not medically stable to d/c.   Difficult to place patient No       Consultants:  none  Procedures: Echo IMPRESSIONS     1. Left ventricular ejection fraction, by estimation, is 55 to 60%. The  left ventricle has normal function. The left ventricle  has no regional  wall motion abnormalities. Left ventricular diastolic parameters are  consistent with Grade II diastolic  dysfunction (pseudonormalization). The average left ventricular global  longitudinal strain is -19.9 %.   2. Right ventricular systolic function is normal. The right ventricular  size is normal. There is mildly elevated pulmonary artery systolic  pressure. The estimated right ventricular systolic pressure is 62.9 mmHg.   3. The mitral valve is normal in structure.  Mild mitral valve  regurgitation.   4. The inferior vena cava is normal in size with <50% respiratory  variability, suggesting right atrial pressure of 8 mmHg.   LE US IMPRESSION: Negative.  Antimicrobials:  Anti-infectives (From admission, onward)    None          Subjective: No new complaints Sob better after albuterol  Objective: Vitals:   09/24/20 1100 09/24/20 1130 09/24/20 1200 09/24/20 1215  BP: 100/85 (!) 153/75 (!) 164/79   Pulse: 83 78 78 88  Resp: (!) 26 17 12 16   Temp:      TempSrc:      SpO2: 97% 97% 97% 97%  Weight:      Height:       No intake or output data in the 24 hours ending 09/24/20 1306 Filed Weights   09/22/20 2257  Weight: 78 kg    Examination:  General: No acute distress. Cardiovascular: Heart sounds show Darlinda Bellows regular rate, and rhythm Lungs: scattered wheezing, on 2 L [place] at time of my evaluation Abdomen: Soft, nontender, nondistended  Neurological: Alert and oriented 3. Moves all extremities 4. Cranial nerves II through XII grossly intact. Skin: Warm and dry. No rashes or lesions. Extremities: No clubbing or cyanosis. Trace edema    Data Reviewed: I have personally reviewed following labs and imaging studies  CBC: Recent Labs  Lab 09/22/20 2259 09/23/20 0607 09/24/20 0700  WBC 8.7 3.8* 9.6  NEUTROABS  --   --  7.8*  HGB 8.4* 7.9* 8.7*  HCT 25.2* 23.3* 25.9*  MCV 99.6 97.5 99.2  PLT 200 194 476    Basic Metabolic Panel: Recent Labs  Lab 09/22/20 2259 09/23/20 0832 09/24/20 0700  NA 133* 135 136  K 4.0 3.9 3.5  CL 99 100 97*  CO2 25 25 27   GLUCOSE 112* 138* 132*  BUN 16 15 27*  CREATININE 0.91 0.85 1.14*  CALCIUM 8.7* 8.9 8.7*  MG  --   --  1.9  PHOS  --   --  4.9*    GFR: Estimated Creatinine Clearance: 45.2 mL/min (Diar Berkel) (by C-G formula based on SCr of 1.14 mg/dL (H)).  Liver Function Tests: Recent Labs  Lab 09/24/20 0700  AST 17  ALT 13  ALKPHOS 112  BILITOT 0.5  PROT 7.0  ALBUMIN 3.4*     CBG: No results for input(s): GLUCAP in the last 168 hours.   Recent Results (from the past 240 hour(s))  Resp Panel by RT-PCR (Flu Ples Trudel&B, Covid) Nasopharyngeal Swab     Status: None   Collection Time: 09/23/20 12:32 AM   Specimen: Nasopharyngeal Swab; Nasopharyngeal(NP) swabs in vial transport medium  Result Value Ref Range Status   SARS Coronavirus 2 by RT PCR NEGATIVE NEGATIVE Final    Comment: (NOTE) SARS-CoV-2 target nucleic acids are NOT DETECTED.  The SARS-CoV-2 RNA is generally detectable in upper respiratory specimens during the acute phase of infection. The lowest concentration of SARS-CoV-2 viral copies this assay can detect is 138 copies/mL. Aidaly Cordner negative result does not preclude SARS-Cov-2  infection and should not be used as the sole basis for treatment or other patient management decisions. Jerold Yoss negative result may occur with  improper specimen collection/handling, submission of specimen other than nasopharyngeal swab, presence of viral mutation(s) within the areas targeted by this assay, and inadequate number of viral copies(<138 copies/mL). Neela Zecca negative result must be combined with clinical observations, patient history, and epidemiological information. The expected result is Negative.  Fact Sheet for Patients:  EntrepreneurPulse.com.au  Fact Sheet for Healthcare Providers:  IncredibleEmployment.be  This test is no t yet approved or cleared by the [place] FDA and  has been authorized for detection and/or diagnosis of SARS-CoV-2 by FDA under an Emergency Use Authorization (EUA). This EUA will remain  in effect (meaning this test can be used) for the duration of the COVID-19 declaration under Section 564(b)(1) of the Act, 21 U.S.C.section 360bbb-3(b)(1), unless the authorization is terminated  or revoked sooner.       Influenza Isiaih Hollenbach by PCR NEGATIVE NEGATIVE Final   Influenza B by PCR NEGATIVE NEGATIVE Final    Comment:  (NOTE) The Xpert Xpress SARS-CoV-2/FLU/RSV plus assay is intended as an aid in the diagnosis of influenza from Nasopharyngeal swab specimens and should not be used as Corlette Ciano sole basis for treatment. Nasal washings and aspirates are unacceptable for Xpert Xpress SARS-CoV-2/FLU/RSV testing.  Fact Sheet for Patients: EntrepreneurPulse.com.au  Fact Sheet for Healthcare Providers: IncredibleEmployment.be  This test is not yet approved or cleared by the [place] FDA and has been authorized for detection and/or diagnosis of SARS-CoV-2 by FDA under an Emergency Use Authorization (EUA). This EUA will remain in effect (meaning this test can be used) for the duration of the COVID-19 declaration under Section 564(b)(1) of the Act, 21 U.S.C. section 360bbb-3(b)(1), unless the authorization is terminated or revoked.  Performed at [facility], [address] [place], [place] [address]   Culture, blood (routine x 2)     Status: None (Preliminary result)   Collection Time: 09/23/20  1:27 AM   Specimen: BLOOD  Result Value Ref Range Status   Specimen Description BLOOD RIGHT HAND  Final   Special Requests   Final    BOTTLES DRAWN AEROBIC AND ANAEROBIC Blood Culture adequate volume   Culture   Final    NO GROWTH 1 DAY Performed at [facility], [address], [place], [place] [address]    Report Status PENDING  Incomplete  Culture, blood (routine x 2)     Status: None (Preliminary result)   Collection Time: 09/23/20  1:27 AM   Specimen: BLOOD  Result Value Ref Range Status   Specimen Description BLOOD LEFT ANTECUBITAL  Final   Special Requests   Final    BOTTLES DRAWN AEROBIC AND ANAEROBIC Blood Culture adequate volume   Culture   Final    NO GROWTH 1 DAY Performed at [facility], [address], [place],  [address]    Report Status PENDING  Incomplete         Radiology Studies: DG Chest 2 View  Result  Date: 09/22/2020 CLINICAL DATA:  Shortness of breath. EXAM: CHEST - 2 VIEW COMPARISON:  Chest x-ray 08/29/2020 FINDINGS: The heart size and mediastinal contours are unchanged. Aortic calcification. Prominent hilar vasculature. Left base/retrocardiac opacity. Interval development of diffusely increased interstitial markings. Interval development of bilateral trace pleural effusions. No pneumothorax. No acute osseous abnormality. IMPRESSION: 1. Mild pulmonary edema with bilateral trace pleural effusions. 2. Left base/retrocardiac opacity. Finding could represent infection/inflammation versus  atelectasis. Followup PA and lateral chest X-ray is recommended in 3-4 weeks following therapy to ensure resolution and exclude underlying malignancy. 3.  Aortic Atherosclerosis (ICD10-I70.0). Electronically Signed   By: Iven Finn M.D.   On: 09/22/2020 23:45   US Venous Img Lower Bilateral (DVT)  Result Date: 09/23/2020 CLINICAL DATA:  Evaluated edema.  Rule out DVT. EXAM: BILATERAL LOWER EXTREMITY VENOUS DOPPLER ULTRASOUND TECHNIQUE: Gray-scale sonography with compression, as well as color and duplex ultrasound, were performed to evaluate the deep venous system(s) from the level of the common femoral vein through the popliteal and proximal calf veins. COMPARISON:  None. FINDINGS: VENOUS Normal compressibility of the common femoral, superficial femoral, and popliteal veins, as well as the visualized calf veins. Visualized portions of profunda femoral vein and great saphenous vein unremarkable. No filling defects to suggest DVT on grayscale or color Doppler imaging. Doppler waveforms show normal direction of venous flow, normal respiratory plasticity and response to augmentation. Limited views of the contralateral common femoral vein are unremarkable. OTHER None. Limitations: Suboptimal visualization of the left peroneal vein. IMPRESSION: Negative. Electronically Signed   By: Kerby Moors M.D.   On: 09/23/2020 09:48    ECHOCARDIOGRAM COMPLETE  Result Date: 09/24/2020    ECHOCARDIOGRAM REPORT   Patient Name:   Wanda Ochoa Date of Exam: 09/23/2020 Medical Rec #:  [id number]          Height:       69.0 in Accession #:    [id number]         Weight:       172.0 lb Date of Birth:  10-19-1945           BSA:          1.938 m Patient Age:    87 years           BP:           163/78 mmHg Patient Gender: F                  HR:           71 bpm. Exam Location:  [facility] Procedure: 2D Echo and Strain Analysis Indications:     CHF I50.31  History:         Patient has no prior history of Echocardiogram examinations.  Sonographer:     Kathlen Brunswick RDCS Referring Phys:  [id number] [place] Diagnosing Phys: Ida Rogue MD  Sonographer Comments: Global longitudinal strain was attempted. IMPRESSIONS  1. Left ventricular ejection fraction, by estimation, is 55 to 60%. The left ventricle has normal function. The left ventricle has no regional wall motion abnormalities. Left ventricular diastolic parameters are consistent with Grade II diastolic dysfunction (pseudonormalization). The average left ventricular global longitudinal strain is -19.9 %.  2. Right ventricular systolic function is normal. The right ventricular size is normal. There is mildly elevated pulmonary artery systolic pressure. The estimated right ventricular systolic pressure is 19.6 mmHg.  3. The mitral valve is normal in structure. Mild mitral valve regurgitation.  4. The inferior vena cava is normal in size with <50% respiratory variability, suggesting right atrial pressure of 8 mmHg. FINDINGS  Left Ventricle: Left ventricular ejection fraction, by estimation, is 55 to 60%. The left ventricle has normal function. The left ventricle has no regional wall motion abnormalities. The average left ventricular global longitudinal strain is -19.9 %. The left ventricular internal cavity size was normal in size. There is borderline left ventricular hypertrophy. Left ventricular  diastolic parameters are consistent with Grade II diastolic dysfunction (pseudonormalization). Right Ventricle: The right ventricular size is normal. No increase in right ventricular wall thickness. Right ventricular systolic function is normal. There is mildly elevated pulmonary artery systolic pressure. The tricuspid regurgitant velocity is 2.87  m/s, and with an assumed right atrial pressure of 8 mmHg, the estimated right ventricular systolic pressure is 85.4 mmHg. Left Atrium: Left atrial size was normal in size. Right Atrium: Right atrial size was normal in size. Pericardium: There is no evidence of pericardial effusion. Mitral Valve: The mitral valve is normal in structure. Mild mitral valve regurgitation. No evidence of mitral valve stenosis. Tricuspid Valve: The tricuspid valve is normal in structure. Tricuspid valve regurgitation is not demonstrated. No evidence of tricuspid stenosis. Aortic Valve: The aortic valve is normal in structure. Aortic valve regurgitation is not visualized. Mild aortic valve sclerosis is present, with no evidence of aortic valve stenosis. Aortic valve peak gradient measures 7.4 mmHg. Pulmonic Valve: The pulmonic valve was normal in structure. Pulmonic valve regurgitation is trivial. No evidence of pulmonic stenosis. Aorta: The aortic root is normal in size and structure. Venous: The inferior vena cava is normal in size with less than 50% respiratory variability, suggesting right atrial pressure of 8 mmHg. IAS/Shunts: No atrial level shunt detected by color flow Doppler.  LEFT VENTRICLE PLAX 2D LVIDd:         4.71 cm  Diastology LVIDs:         3.15 cm  LV e' medial:    7.72 cm/s LV PW:         1.05 cm  LV E/e' medial:  13.5 LV IVS:        1.18 cm  LV e' lateral:   6.64 cm/s LVOT diam:     2.10 cm  LV E/e' lateral: 15.7 LV SV:         79 LV SV Index:   41       2D Longitudinal Strain LVOT Area:     3.46 cm 2D Strain GLS Avg:     -19.9 %  RIGHT VENTRICLE RV Basal diam:  3.10 cm RV S  prime:     13.20 cm/s TAPSE (M-mode): 2.6 cm LEFT ATRIUM           Index       RIGHT ATRIUM           Index LA diam:      3.80 cm 1.96 cm/m  RA Area:     16.20 cm LA Vol (A2C): 64.5 ml 33.29 ml/m RA Volume:   41.40 ml  21.37 ml/m LA Vol (A4C): 61.6 ml 31.79 ml/m  AORTIC VALVE                PULMONIC VALVE AV Area (Vmax): 2.48 cm    PV Vmax:       0.86 m/s AV Vmax:        136.00 cm/s PV Peak grad:  2.9 mmHg AV Peak Grad:   7.4 mmHg LVOT Vmax:      97.30 cm/s LVOT Vmean:     65.100 cm/s LVOT VTI:       0.228 m  AORTA Ao Root diam: 3.30 cm Ao Asc diam:  3.20 cm MITRAL VALVE                 TRICUSPID VALVE MV Area (PHT): 5.62 cm      TV Peak grad:   31.8 mmHg MV Decel Time: 135  msec      TV Vmax:        2.82 m/s MR Peak grad:    155.8 mmHg  TR Peak grad:   32.9 mmHg MR Mean grad:    100.0 mmHg  TR Vmax:        287.00 cm/s MR Vmax:         624.00 cm/s MR Vmean:        470.0 cm/s  SHUNTS MR PISA:         1.57 cm    Systemic VTI:  0.23 m MR PISA Eff ROA: 8 mm       Systemic Diam: 2.10 cm MR PISA Radius:  0.50 cm MV E velocity: 104.00 cm/s MV Divonte Senger velocity: 101.00 cm/s MV E/Tamarah Bhullar ratio:  1.03 Ida Rogue MD Electronically signed by Ida Rogue MD Signature Date/Time: 09/24/2020/9:27:36 AM    Final         Scheduled Meds:  aspirin EC  81 mg Oral Daily   citalopram  10 mg Oral Daily   enoxaparin (LOVENOX) injection  40 mg Subcutaneous Q24H   furosemide  40 mg Intravenous BID   ipratropium-albuterol  3 mL Nebulization QID   labetalol  200 mg Oral BID   levothyroxine  150 mcg Oral Daily   nortriptyline  75 mg Oral QHS   polyethylene glycol  17 g Oral Daily   pravastatin  20 mg Oral QHS   predniSONE  40 mg Oral Q breakfast   QUEtiapine  100 mg Oral QHS   vitamin B-12  1,000 mcg Oral Daily   Continuous Infusions:   LOS: 1 day    Time spent: over 30 min    Fayrene Helper, MD [facility]   To contact the attending provider between 7A-7P or the covering provider during after hours  7P-7A, please log into the web site [email or web address] and access using universal [place] password for that web site. If you do not have the password, please call the hospital operator.  09/24/2020, 1:06 PM

## 2020-09-24 NOTE — ED Notes (Signed)
Pt assisted to use bedside commode

## 2020-09-24 NOTE — ED Notes (Signed)
Report to ariel, rn.  

## 2020-09-24 NOTE — ED Notes (Signed)
Pt with increased wheezing, neb initiated.

## 2020-09-24 NOTE — ED Notes (Signed)
Pt taken off [place] O2- sats stay stable at 96%

## 2020-09-24 NOTE — ED Notes (Signed)
Message sent to pharmacy for labetalol and vit b12

## 2020-09-24 NOTE — Evaluation (Signed)
Physical Therapy Evaluation Patient Details Name: Wanda Ochoa MRN: [id number] DOB: 11-Sep-1945 Today's Date: 09/24/2020   History of Present Illness  Patient is a 75 y.o.  with medical history significant for asthma, depression and anxiety, hypertension and hypothyroidism, presented to the emergency room with acute onset of worsening dyspnea with associated cough and wheezing for the last couple days. Found to have acute hypoxic respiratory failure with heart failure exacerbation and asthma exacerbation. Patient reports she was recently discharged from a rehab center to home.   Clinical Impression  Patient agreeable to PT. Patient reports she was recently discharged from rehab facility to home and has a platform rolling walker at home after recent right hand fracture following a fall.  Currently patient was able to ambulate with assistance with occasional unsteadiness noted without assistive device. Sp02 was 88% on room air and quickly increased to 96% with replacement of supplemental oxygen and cues for breathing techniques. Patient appears to have generalized weakness and was fatigued with activity. She reports feeling stronger today than yesterday overall. Patient is hopeful to return home with assistance from family as she was recently just discharged from rehab. Recommend to continue PT to maximize independence and facilitate return to prior level of function.     Follow Up Recommendations Home health PT;Supervision for mobility/OOB    Equipment Recommendations  None recommended by PT    Recommendations for Other Services       Precautions / Restrictions Precautions Precautions: Fall Restrictions Weight Bearing Restrictions: Yes RUE Weight Bearing: Non weight bearing (patient reports she is NWB on right hand following recent fracture. splint in place)      Mobility  Bed Mobility Overal bed mobility: Needs Assistance Bed Mobility: Supine to Sit;Sit to Supine     Supine to  sit: Supervision Sit to supine: Supervision   General bed mobility comments: increased time required to complete tasks. Sp02 97% on 2 L02. removed 02 for mobility with sats initially in the 90's with seated level activity on room air    Transfers Overall transfer level: Needs assistance   Transfers: Sit to/from Stand Sit to Stand: Min guard         General transfer comment: Min guard assistance for safety with standing from bed and from toilet. verbal cues to avoid using right hand to push up due to NWB status  Ambulation/Gait Ambulation/Gait assistance: Min guard (cues for safety provided) Gait Distance (Feet): 30 Feet Assistive device: 1 person hand held assist Gait Pattern/deviations: Decreased stride length;Narrow base of support Gait velocity: decreased   General Gait Details: patient ambulated to bathroom and around room while Sp02 being monitored. Sp02 88% on room air and quickly increased to 96% with replacement of supplemental oxygen and cues for breathing techniques. no dizziness reported with ambulation. patient would benefit from platform rolling walker with ambulation due to mild unsteadiness in standing  Stairs            Wheelchair Mobility    Modified Rankin (Stroke Patients Only)       Balance Overall balance assessment: History of Falls Sitting-balance support: Feet supported Sitting balance-Leahy Scale: Good     Standing balance support: Single extremity supported Standing balance-Leahy Scale: Fair                               Pertinent Vitals/Pain Pain Assessment: No/denies pain    Home Living Family/patient expects to be discharged to:: Private residence  Living Arrangements: Children Available Help at Discharge: Family;Available PRN/intermittently Type of Home: House Home Access: Stairs to enter Entrance Stairs-Rails: Can reach both Entrance Stairs-Number of Steps: 4 Home Layout: One level Home Equipment: Walker - 2  wheels;Bedside commode      Prior Function Level of Independence: Independent with assistive device(s)         Comments: patient reports she was independent with ambulation, had used a platform walker recently at rehab. she reports independent with ADLs     Hand Dominance   Dominant Hand: Right    Extremity/Trunk Assessment   Upper Extremity Assessment Upper Extremity Assessment:  (hand splint in place due to previous hand fractures following fall) RUE Deficits / Details: patient able to activate shoulder, elbow movement.    Lower Extremity Assessment Lower Extremity Assessment: Generalized weakness       Communication   Communication: No difficulties  Cognition Arousal/Alertness: Awake/alert Behavior During Therapy: WFL for tasks assessed/performed Overall Cognitive Status: Within Functional Limits for tasks assessed                                 General Comments: patient able to follow single step commands without difficulty      General Comments      Exercises     Assessment/Plan    PT Assessment Patient needs continued PT services  PT Problem List Decreased strength;Decreased range of motion;Decreased activity tolerance;Decreased balance;Decreased mobility;Decreased safety awareness;Cardiopulmonary status limiting activity       PT Treatment Interventions DME instruction;Gait training;Stair training;Functional mobility training;Therapeutic exercise;Therapeutic activities;Balance training;Neuromuscular re-education;Patient/family education    PT Goals (Current goals can be found in the Care Plan section)  Acute Rehab PT Goals Patient Stated Goal: to return home PT Goal Formulation: With patient Time For Goal Achievement: 10/08/20 Potential to Achieve Goals: Good    Frequency Min 2X/week   Barriers to discharge        Co-evaluation               AM-PAC PT "6 Clicks" Mobility  Outcome Measure Help needed turning from your  back to your side while in a flat bed without using bedrails?: None Help needed moving from lying on your back to sitting on the side of a flat bed without using bedrails?: A Little Help needed moving to and from a bed to a chair (including a wheelchair)?: A Little Help needed standing up from a chair using your arms (e.g., wheelchair or bedside chair)?: A Little Help needed to walk in hospital room?: A Little Help needed climbing 3-5 steps with a railing? : A Little 6 Click Score: 19    End of Session Equipment Utilized During Treatment: Gait belt Activity Tolerance: Patient tolerated treatment well Patient left: in bed;with call bell/phone within reach;with bed alarm set;with nursing/sitter in room (with oxygen in place) Nurse Communication: Mobility status PT Visit Diagnosis: Muscle weakness (generalized) (M62.81);Difficulty in walking, not elsewhere classified (R26.2);Repeated falls (R29.6)    Time: 9528-4132 PT Time Calculation (min) (ACUTE ONLY): 22 min   Charges:   PT Evaluation $PT Eval Moderate Complexity: 1 Mod PT Treatments $Therapeutic Activity: 8-22 mins        Minna Merritts, PT, MPT  Percell Locus 09/24/2020, 3:11 PM

## 2020-09-24 NOTE — ED Notes (Signed)
Wheezing improved post neb. Lab here for venipuncture.

## 2020-09-24 NOTE — ED Notes (Signed)
Pt ambulatory to bathroom with 1 assist.

## 2020-09-24 NOTE — ED Notes (Signed)
Waiting on synthroid from pharmacy.  

## 2020-09-25 ENCOUNTER — Inpatient Hospital Stay: Payer: Medicare Other

## 2020-09-25 LAB — CBC WITH DIFFERENTIAL/PLATELET
Abs Immature Granulocytes: 0.07 10*3/uL (ref 0.00–0.07)
Basophils Absolute: 0 10*3/uL (ref 0.0–0.1)
Basophils Relative: 0 %
Eosinophils Absolute: 0 10*3/uL (ref 0.0–0.5)
Eosinophils Relative: 0 %
HCT: 25.7 % — ABNORMAL LOW (ref 36.0–46.0)
Hemoglobin: 8.6 g/dL — ABNORMAL LOW (ref 12.0–15.0)
Immature Granulocytes: 1 %
Lymphocytes Relative: 23 %
Lymphs Abs: 2.5 10*3/uL (ref 0.7–4.0)
MCH: 32.7 pg (ref 26.0–34.0)
MCHC: 33.5 g/dL (ref 30.0–36.0)
MCV: 97.7 fL (ref 80.0–100.0)
Monocytes Absolute: 0.9 10*3/uL (ref 0.1–1.0)
Monocytes Relative: 9 %
Neutro Abs: 7.2 10*3/uL (ref 1.7–7.7)
Neutrophils Relative %: 67 %
Platelets: 253 10*3/uL (ref 150–400)
RBC: 2.63 MIL/uL — ABNORMAL LOW (ref 3.87–5.11)
RDW: 12.8 % (ref 11.5–15.5)
WBC: 10.7 10*3/uL — ABNORMAL HIGH (ref 4.0–10.5)
nRBC: 0 % (ref 0.0–0.2)

## 2020-09-25 LAB — COMPREHENSIVE METABOLIC PANEL
ALT: 12 U/L (ref 0–44)
AST: 16 U/L (ref 15–41)
Albumin: 3.2 g/dL — ABNORMAL LOW (ref 3.5–5.0)
Alkaline Phosphatase: 111 U/L (ref 38–126)
Anion gap: 9 (ref 5–15)
BUN: 30 mg/dL — ABNORMAL HIGH (ref 8–23)
CO2: 28 mmol/L (ref 22–32)
Calcium: 8.5 mg/dL — ABNORMAL LOW (ref 8.9–10.3)
Chloride: 98 mmol/L (ref 98–111)
Creatinine, Ser: 1.18 mg/dL — ABNORMAL HIGH (ref 0.44–1.00)
GFR, Estimated: 48 mL/min — ABNORMAL LOW (ref >60)
Glucose, Bld: 102 mg/dL — ABNORMAL HIGH (ref 70–99)
Potassium: 3.1 mmol/L — ABNORMAL LOW (ref 3.5–5.1)
Sodium: 135 mmol/L (ref 135–145)
Total Bilirubin: 0.5 mg/dL (ref 0.3–1.2)
Total Protein: 6.5 g/dL (ref 6.5–8.1)

## 2020-09-25 LAB — MAGNESIUM: Magnesium: 1.9 mg/dL (ref 1.7–2.4)

## 2020-09-25 LAB — PHOSPHORUS: Phosphorus: 3.3 mg/dL (ref 2.5–4.6)

## 2020-09-25 MED ORDER — ALPRAZOLAM 0.25 MG PO TABS
0.2500 mg | ORAL_TABLET | Freq: Every day | ORAL | Status: DC | PRN
  Administered 2020-09-25: 12:00:00 0.25 mg via ORAL
  Filled 2020-09-25: qty 1

## 2020-09-25 MED ORDER — FUROSEMIDE 10 MG/ML IJ SOLN
40.0000 mg | Freq: Once | INTRAMUSCULAR | Status: AC
  Administered 2020-09-25: 40 mg via INTRAVENOUS
  Filled 2020-09-25: qty 4

## 2020-09-25 MED ORDER — DOXYCYCLINE HYCLATE 100 MG PO TABS
100.0000 mg | ORAL_TABLET | Freq: Two times a day (BID) | ORAL | Status: DC
  Administered 2020-09-25 – 2020-09-26 (×3): 100 mg via ORAL
  Filled 2020-09-25 (×3): qty 1

## 2020-09-25 MED ORDER — FUROSEMIDE 40 MG PO TABS: 40.0000 mg | ORAL_TABLET | Freq: Two times a day (BID) | ORAL | Status: DC

## 2020-09-25 MED ORDER — LOSARTAN POTASSIUM 25 MG PO TABS
25.0000 mg | ORAL_TABLET | Freq: Every day | ORAL | Status: DC
  Administered 2020-09-25 – 2020-09-26 (×2): 25 mg via ORAL
  Filled 2020-09-25 (×2): qty 1

## 2020-09-25 MED ORDER — VITAMIN D 25 MCG (1000 UNIT) PO TABS
1000.0000 [IU] | ORAL_TABLET | Freq: Every day | ORAL | Status: DC
  Administered 2020-09-25 – 2020-09-26 (×2): 1000 [IU] via ORAL
  Filled 2020-09-25 (×2): qty 1

## 2020-09-25 MED ORDER — FUROSEMIDE 40 MG PO TABS
40.0000 mg | ORAL_TABLET | Freq: Two times a day (BID) | ORAL | Status: DC
  Administered 2020-09-26: 10:00:00 40 mg via ORAL
  Filled 2020-09-25: qty 1

## 2020-09-25 MED ORDER — IPRATROPIUM-ALBUTEROL 0.5-2.5 (3) MG/3ML IN SOLN
3.0000 mL | Freq: Three times a day (TID) | RESPIRATORY_TRACT | Status: DC
  Administered 2020-09-25 – 2020-09-26 (×3): 3 mL via RESPIRATORY_TRACT
  Filled 2020-09-25 (×4): qty 3

## 2020-09-25 NOTE — Progress Notes (Signed)
PROGRESS NOTE    [name]  JEH:[id number] DOB: Apr 09, 1945 DOA: 09/22/2020 PCP: Virginia Crews, MD  Chief Complaint  Patient presents with   Shortness of Breath    Brief Narrative:  Wanda Ochoa is Wanda Ochoa 75 y.o. Caucasian female with medical history significant for asthma, depression and anxiety, hypertension and hypothyroidism, presented to the emergency room with acute onset of worsening dyspnea with associated cough and wheezing for the last couple days, for which she received Lavone Barrientes 2 nebulized DuoNebs and IV Solu-Medrol by EMS on route to the hospital.  She denied any fever or chills.  No nausea or vomiting or abdominal pain.  No chest pain or palpitations.  She was fairly somnolent but arousable.  No dysuria, oliguria or hematuria or flank pain. ED Course: Upon presentation to the emergency room blood pressure was 148/65 with [name] respiratory rate of 22 with otherwise normal vital signs.  The patient pulse oximetry was initially 80% on room air and came up to 93% on 3 to 4 L of O2 by nasal cannula.  Labs revealed mild hyponatremia and Tashay Bozich BNP of 418.5.  High-sensitivity troponin I was 5 twice.  Lactic acid was 0.6.  CBC showed anemia at her baseline.  Influenza antigens and COVID-19 PCR came back negative.  Blood cultures were drawn. EKG as reviewed by me : Showed normal sinus rhythm with Breshae Belcher rate of 65 and prolonged QT interval. Imaging: 2 view chest x-ray showed the following: 1. Mild pulmonary edema with bilateral trace pleural effusions. 2. Left base/retrocardiac opacity. Finding could represent infection/inflammation versus atelectasis. Followup PA and lateral chest X-ray is recommended in 3-4 weeks following therapy to ensure resolution and exclude underlying malignancy. 3.  Aortic Atherosclerosis.  The patient was given another DuoNeb here with an albuterol MDI as well.  She will be [facility] for further evaluation and management.   Assessment &  Plan:   Active Problems:   Shortness of breath   Acute asthma exacerbation  Acute Hypoxic Respiratory Failure 2/2 Heart Failure Exacerbation and Asthma Exacerbation Scattered wheezing today Edema gradually improving Wean o2 as tolerated - on 2 L this AM, weaned to RA this PM - possible d/c in AM CR with mild pulm edema with bilateral trace effusions  CXR 7/4 with persistent prominent interstitial lung markings concerning for interstitial pulm edema, increased densities in RUL represent asymmetric pulm edema vs aatypical infection.  Basilar densities L>R, small effusions, ? Consolidation at medial L lung base. No fever or significant leukocytosis, hold off on abx Gave dose of oral this morning, with persistent bibasilar crackles, CXR findings, will give dose of IV lasix this PM Doxycycline given possible atypical infection Steroids.  Scheduled and prn nebs.   Strict I/O, daily weights Echo with grade 2 diastolic dysfunction  LE [place] negative for DVT  L base retrocardiac opacity Follow CXR in 3-4 weeks No clear signs of infection, follow   Hypothyroidism Synthroid  HTN Labetalol  Dyslipidemia Statin  B12 def B12  Depression Celexa, seroquel, pamelor  Vitamin D Deficiency Vitamin D supplementation   DVT prophylaxis: lovenox Code Status: full Family Communication: called son 7/4 Wanda Ochoa) Disposition:   Status is: Inpatient  Remains inpatient appropriate because:Inpatient level of care appropriate due to severity of illness  Dispo: The patient is from: Home              Anticipated d/c is to: Home  Patient currently is not medically stable to d/c.   Difficult to place patient No       Consultants:  none  Procedures: Echo IMPRESSIONS     1. Left ventricular ejection fraction, by estimation, is 55 to 60%. The  left ventricle has normal function. The left ventricle has no regional  wall motion abnormalities. Left ventricular diastolic parameters  are  consistent with Grade II diastolic  dysfunction (pseudonormalization). The average left ventricular global  longitudinal strain is -19.9 %.   2. Right ventricular systolic function is normal. The right ventricular  size is normal. There is mildly elevated pulmonary artery systolic  pressure. The estimated right ventricular systolic pressure is 76.1 mmHg.   3. The mitral valve is normal in structure. Mild mitral valve  regurgitation.   4. The inferior vena cava is normal in size with <50% respiratory  variability, suggesting right atrial pressure of 8 mmHg.   LE US IMPRESSION: Negative.  Antimicrobials:  Anti-infectives (From admission, onward)    Start     Dose/Rate Route Frequency Ordered Stop   09/25/20 1345  doxycycline (VIBRA-TABS) tablet 100 mg        100 mg Oral Every 12 hours 09/25/20 1258            Subjective: No new complaints Sob better after albuterol  Objective: Vitals:   09/25/20 0422 09/25/20 0839 09/25/20 1117 09/25/20 1203  BP: (!) 158/74 (!) 184/82 (!) 192/89 (!) 165/74  Pulse: 75 83 89 80  Resp: 16 18  20   Temp: 98 F (36.7 C) 97.9 F (36.6 C)  98.1 F (36.7 C)  TempSrc:      SpO2: 93% 92%  (!) 89%  Weight:      Height:        Intake/Output Summary (Last 24 hours) at 09/25/2020 1258 Last data filed at 09/25/2020 1028 Gross per 24 hour  Intake 360 ml  Output --  Net 360 ml   Filed Weights   09/22/20 2257  Weight: 78 kg    Examination:  General: No acute distress. Cardiovascular: Heart sounds show Mashanda Ishibashi regular rate, and rhythm.  Lungs: bibasilar crackles Abdomen: Soft, nontender, nondistended  Neurological: Alert and oriented 3. Moves all extremities 4. Cranial nerves II through XII grossly intact. Skin: Warm and dry. No rashes or lesions. Extremities: improving lower extremity edema, dependent edema     Data Reviewed: I have personally reviewed following labs and imaging studies  CBC: Recent Labs  Lab 09/22/20 2259  09/23/20 0607 09/24/20 0700 09/25/20 0356  WBC 8.7 3.8* 9.6 10.7*  NEUTROABS  --   --  7.8* 7.2  HGB 8.4* 7.9* 8.7* 8.6*  HCT 25.2* 23.3* 25.9* 25.7*  MCV 99.6 97.5 99.2 97.7  PLT 200 194 235 950    Basic Metabolic Panel: Recent Labs  Lab 09/22/20 2259 09/23/20 0832 09/24/20 0700 09/25/20 0356  NA 133* 135 136 135  K 4.0 3.9 3.5 3.1*  CL 99 100 97* 98  CO2 25 25 27 28   GLUCOSE 112* 138* 132* 102*  BUN 16 15 27* 30*  CREATININE 0.91 0.85 1.14* 1.18*  CALCIUM 8.7* 8.9 8.7* 8.5*  MG  --   --  1.9 1.9  PHOS  --   --  4.9* 3.3    GFR: Estimated Creatinine Clearance: 43.7 mL/min (Geralyn Figiel) (by C-G formula based on SCr of 1.18 mg/dL (H)).  Liver Function Tests: Recent Labs  Lab 09/24/20 0700 09/25/20 0356  AST 17 16  ALT 13  12  ALKPHOS 112 111  BILITOT 0.5 0.5  PROT 7.0 6.5  ALBUMIN 3.4* 3.2*    CBG: No results for input(s): GLUCAP in the last 168 hours.   Recent Results (from the past 240 hour(s))  Resp Panel by RT-PCR (Flu Dyamon Sosinski&B, Covid) Nasopharyngeal Swab     Status: None   Collection Time: 09/23/20 12:32 AM   Specimen: Nasopharyngeal Swab; Nasopharyngeal(NP) swabs in vial transport medium  Result Value Ref Range Status   SARS Coronavirus 2 by RT PCR NEGATIVE NEGATIVE Final    Comment: (NOTE) SARS-CoV-2 target nucleic acids are NOT DETECTED.  The SARS-CoV-2 RNA is generally detectable in upper respiratory specimens during the acute phase of infection. The lowest concentration of SARS-CoV-2 viral copies this assay can detect is 138 copies/mL. Shawnese Magner negative result does not preclude SARS-Cov-2 infection and should not be used as the sole basis for treatment or other patient management decisions. Maven Rosander negative result may occur with  improper specimen collection/handling, submission of specimen other than nasopharyngeal swab, presence of viral mutation(s) within the areas targeted by this assay, and inadequate number of viral copies(<138 copies/mL). Duchess Armendarez negative result must  be combined with clinical observations, patient history, and epidemiological information. The expected result is Negative.  Fact Sheet for Patients:  EntrepreneurPulse.com.au  Fact Sheet for Healthcare Providers:  IncredibleEmployment.be  This test is no t yet approved or cleared by the [place] FDA and  has been authorized for detection and/or diagnosis of SARS-CoV-2 by FDA under an Emergency Use Authorization (EUA). This EUA will remain  in effect (meaning this test can be used) for the duration of the COVID-19 declaration under Section 564(b)(1) of the Act, 21 U.S.C.section 360bbb-3(b)(1), unless the authorization is terminated  or revoked sooner.       Influenza Kastiel Simonian by PCR NEGATIVE NEGATIVE Final   Influenza B by PCR NEGATIVE NEGATIVE Final    Comment: (NOTE) The Xpert Xpress SARS-CoV-2/FLU/RSV plus assay is intended as an aid in the diagnosis of influenza from Nasopharyngeal swab specimens and should not be used as Bradon Fester sole basis for treatment. Nasal washings and aspirates are unacceptable for Xpert Xpress SARS-CoV-2/FLU/RSV testing.  Fact Sheet for Patients: EntrepreneurPulse.com.au  Fact Sheet for Healthcare Providers: IncredibleEmployment.be  This test is not yet approved or cleared by the [place] FDA and has been authorized for detection and/or diagnosis of SARS-CoV-2 by FDA under an Emergency Use Authorization (EUA). This EUA will remain in effect (meaning this test can be used) for the duration of the COVID-19 declaration under Section 564(b)(1) of the Act, 21 U.S.C. section 360bbb-3(b)(1), unless the authorization is terminated or revoked.  Performed at [facility], [address] [place], [place] [address]   Culture, blood (routine x 2)     Status: None (Preliminary result)   Collection Time: 09/23/20  1:27 AM   Specimen: BLOOD  Result Value Ref Range Status    Specimen Description BLOOD RIGHT HAND  Final   Special Requests   Final    BOTTLES DRAWN AEROBIC AND ANAEROBIC Blood Culture adequate volume   Culture   Final    NO GROWTH 2 DAYS Performed at [facility], [address], [place], [place] [address]    Report Status PENDING  Incomplete  Culture, blood (routine x 2)     Status: None (Preliminary result)   Collection Time: 09/23/20  1:27 AM   Specimen: BLOOD  Result Value Ref Range Status   Specimen Description BLOOD LEFT ANTECUBITAL  Final  Special Requests   Final    BOTTLES DRAWN AEROBIC AND ANAEROBIC Blood Culture adequate volume   Culture   Final    NO GROWTH 2 DAYS Performed at [facility], [address] [place], [place] [address]    Report Status PENDING  Incomplete         Radiology Studies: DG Chest Port 1 View  Result Date: 09/25/2020 CLINICAL DATA:  Shortness of breath. EXAM: PORTABLE CHEST 1 VIEW COMPARISON:  09/22/2020 FINDINGS: Slightly increased interstitial densities in the right upper lung. Prominent interstitial lung markings are again noted in both lungs. Persistent densities at the left lung base. The medial aspect of left hemidiaphragm is obscured. Mild blunting at the right costophrenic angle. IMPRESSION: 1. Persistent prominent interstitial lung markings are concerning for interstitial pulmonary edema. Slightly increased densities in the right upper lung could represent asymmetric pulmonary edema versus atypical infection. 2. Persistent basilar densities, left side greater than right. Findings are compatible with small effusions and there may be some consolidation at the medial left lung base. Electronically Signed   By: Markus Daft M.D.   On: 09/25/2020 08:44        Scheduled Meds:  aspirin EC  81 mg Oral Daily   cholecalciferol  1,000 Units Oral Daily   citalopram  10 mg Oral Daily   doxycycline  100 mg Oral Q12H   enoxaparin (LOVENOX) injection  40 mg Subcutaneous Q24H   furosemide   40 mg Oral BID   ipratropium-albuterol  3 mL Nebulization TID   labetalol  200 mg Oral BID   levothyroxine  150 mcg Oral Daily   losartan  25 mg Oral Daily   nortriptyline  75 mg Oral QHS   pantoprazole  40 mg Oral Daily   polyethylene glycol  17 g Oral Daily   potassium chloride  40 mEq Oral Once   pravastatin  20 mg Oral QHS   predniSONE  40 mg Oral Q breakfast   QUEtiapine  100 mg Oral QHS   vitamin B-12  1,000 mcg Oral Daily   Continuous Infusions:   LOS: 2 days    Time spent: over 30 min    Fayrene Helper, MD [facility]   To contact the attending provider between 7A-7P or the covering provider during after hours 7P-7A, please log into the web site [email or web address] and access using universal [place] password for that web site. If you do not have the password, please call the hospital operator.  09/25/2020, 12:58 PM

## 2020-09-25 NOTE — Plan of Care (Signed)
  Problem: Education: Goal: Knowledge of General Education information will improve Description: Including pain rating scale, medication(s)/side effects and non-pharmacologic comfort measures Outcome: Progressing   Problem: Health Behavior/Discharge Planning: Goal: Ability to manage health-related needs will improve Outcome: Progressing   Problem: Nutrition: Goal: Adequate nutrition will be maintained Outcome: Progressing   Problem: Coping: Goal: Level of anxiety will decrease Outcome: Progressing   Problem: Safety: Goal: Ability to remain free from injury will improve Outcome: Progressing   Problem: Skin Integrity: Goal: Risk for impaired skin integrity will decrease Outcome: Progressing   

## 2020-09-25 NOTE — Progress Notes (Signed)
SATURATION QUALIFICATIONS: (This note is used to comply with regulatory documentation for home oxygen)  Patient Saturations on Room Air at Rest = 92%  Patient Saturations on Room Air while Ambulating = 81%  Patient Saturations on 2 Liters of oxygen while Ambulating = 96%

## 2020-09-25 NOTE — TOC Progression Note (Signed)
Transition of Care [facility]) - Progression Note    Patient Details  Name: Wanda Ochoa MRN: [id number] Date of Birth: May 05, 1945  Transition of Care [facility]) CM/SW [place], RN Phone Number: 09/25/2020, 1:48 PM  Clinical Narrative:   Patient was discharged home from [facility], and son brought her to ER.  Patient states son helps her at home.  She has no concerns about returning home other than ensuring she is healthy enough to do so.  RNCM offered listening ear and emphasized open communication from patient and provider to ensure this.  Patient is receptive to recommendation of Home Health.  [facility] accepted patient.  TOC contact information given, TOC to follow to discharge.         Expected Discharge Plan and Services                                                 Social Determinants of Health (SDOH) Interventions    Readmission Risk Interventions No flowsheet data found.

## 2020-09-26 ENCOUNTER — Inpatient Hospital Stay: Payer: Medicare Other

## 2020-09-26 LAB — CBC WITH DIFFERENTIAL/PLATELET
Abs Immature Granulocytes: 0.04 10*3/uL (ref 0.00–0.07)
Basophils Absolute: 0 10*3/uL (ref 0.0–0.1)
Basophils Relative: 0 %
Eosinophils Absolute: 0 10*3/uL (ref 0.0–0.5)
Eosinophils Relative: 0 %
HCT: 25.3 % — ABNORMAL LOW (ref 36.0–46.0)
Hemoglobin: 8.6 g/dL — ABNORMAL LOW (ref 12.0–15.0)
Immature Granulocytes: 1 %
Lymphocytes Relative: 35 %
Lymphs Abs: 2.8 10*3/uL (ref 0.7–4.0)
MCH: 33.2 pg (ref 26.0–34.0)
MCHC: 34 g/dL (ref 30.0–36.0)
MCV: 97.7 fL (ref 80.0–100.0)
Monocytes Absolute: 0.9 10*3/uL (ref 0.1–1.0)
Monocytes Relative: 11 %
Neutro Abs: 4.2 10*3/uL (ref 1.7–7.7)
Neutrophils Relative %: 53 %
Platelets: 249 10*3/uL (ref 150–400)
RBC: 2.59 MIL/uL — ABNORMAL LOW (ref 3.87–5.11)
RDW: 12.8 % (ref 11.5–15.5)
WBC: 8 10*3/uL (ref 4.0–10.5)
nRBC: 0 % (ref 0.0–0.2)

## 2020-09-26 LAB — COMPREHENSIVE METABOLIC PANEL
ALT: 13 U/L (ref 0–44)
AST: 15 U/L (ref 15–41)
Albumin: 3.1 g/dL — ABNORMAL LOW (ref 3.5–5.0)
Alkaline Phosphatase: 92 U/L (ref 38–126)
Anion gap: 7 (ref 5–15)
BUN: 25 mg/dL — ABNORMAL HIGH (ref 8–23)
CO2: 35 mmol/L — ABNORMAL HIGH (ref 22–32)
Calcium: 8.6 mg/dL — ABNORMAL LOW (ref 8.9–10.3)
Chloride: 96 mmol/L — ABNORMAL LOW (ref 98–111)
Creatinine, Ser: 1.16 mg/dL — ABNORMAL HIGH (ref 0.44–1.00)
GFR, Estimated: 49 mL/min — ABNORMAL LOW (ref >60)
Glucose, Bld: 90 mg/dL (ref 70–99)
Potassium: 3.3 mmol/L — ABNORMAL LOW (ref 3.5–5.1)
Sodium: 138 mmol/L (ref 135–145)
Total Bilirubin: 0.6 mg/dL (ref 0.3–1.2)
Total Protein: 6.3 g/dL — ABNORMAL LOW (ref 6.5–8.1)

## 2020-09-26 LAB — MAGNESIUM: Magnesium: 1.7 mg/dL (ref 1.7–2.4)

## 2020-09-26 LAB — PHOSPHORUS: Phosphorus: 3.3 mg/dL (ref 2.5–4.6)

## 2020-09-26 MED ORDER — POTASSIUM CHLORIDE ER 10 MEQ PO TBCR: 20.0000 meq | EXTENDED_RELEASE_TABLET | Freq: Every day | ORAL | 0 refills | Status: AC

## 2020-09-26 MED ORDER — PREDNISONE 20 MG PO TABS: 40.0000 mg | ORAL_TABLET | Freq: Every day | ORAL | 0 refills | Status: AC

## 2020-09-26 MED ORDER — DOXYCYCLINE HYCLATE 100 MG PO TABS: 100.0000 mg | ORAL_TABLET | Freq: Two times a day (BID) | ORAL | 0 refills | Status: AC

## 2020-09-26 MED ORDER — FUROSEMIDE 40 MG PO TABS: 40.0000 mg | ORAL_TABLET | Freq: Two times a day (BID) | ORAL | 0 refills | Status: DC

## 2020-09-26 MED ORDER — ALBUTEROL SULFATE HFA 108 (90 BASE) MCG/ACT IN AERS: 2.0000 | INHALATION_SPRAY | RESPIRATORY_TRACT | 4 refills | Status: AC | PRN

## 2020-09-26 MED ORDER — FUROSEMIDE 40 MG PO TABS: 40.0000 mg | ORAL_TABLET | Freq: Every day | ORAL | 0 refills | Status: AC

## 2020-09-26 MED ORDER — ADVAIR HFA 115-21 MCG/ACT IN AERO: 2.0000 | INHALATION_SPRAY | Freq: Two times a day (BID) | RESPIRATORY_TRACT | 1 refills | Status: DC

## 2020-09-26 MED ORDER — VITAMIN D3 25 MCG PO TABS: 1000.0000 [IU] | ORAL_TABLET | Freq: Every day | ORAL | 0 refills | Status: AC

## 2020-09-26 MED ORDER — AMLODIPINE BESYLATE 5 MG PO TABS: 5.0000 mg | ORAL_TABLET | Freq: Every day | ORAL | 0 refills | Status: AC

## 2020-09-26 MED ORDER — LOSARTAN POTASSIUM 25 MG PO TABS: 25.0000 mg | ORAL_TABLET | Freq: Every day | ORAL | 0 refills | Status: DC

## 2020-09-26 MED ORDER — AMLODIPINE BESYLATE 5 MG PO TABS
5.0000 mg | ORAL_TABLET | Freq: Every day | ORAL | Status: DC
  Administered 2020-09-26: 10:00:00 5 mg via ORAL
  Filled 2020-09-26: qty 1

## 2020-09-26 NOTE — Care Management Important Message (Signed)
Important Message  Patient Details  Name: Wanda Ochoa MRN: [id number] Date of Birth: 18-Nov-1945   Medicare Important Message Given:  Yes     Dannette Barbara 09/26/2020, 11:37 AM

## 2020-09-26 NOTE — Discharge Summary (Addendum)
Physician Discharge Summary  Wanda Ochoa MPN:[id number] DOB: 05-Jan-1946 DOA: 09/22/2020  PCP: Wanda Crews, MD  Admit date: 09/22/2020 Discharge date: 09/26/2020  Time spent: 49 minutes  Recommendations for Outpatient Follow-up:  Follow outpatient CBC/CMP Follow volume status/lytes outpatient  Follow elevated pulmonary artery systolic pressure outpatient - repeat echo with cards after diuresis Follow on advair - follow with PCP, consider pulm referral as needed - need for PFTs? Follow with cardiology outpatient  Follow CXR in 3-4 weeks to ensure resolution of abnormal CXR findings Follow BP outpatient - started multiple new meds Consider change in beta blocker if difficulties with wheezing/asthma  Discharge Diagnoses:  Active Problems:   Shortness of breath   Acute asthma exacerbation   Discharge Condition: stable  Diet recommendation: heart healthy  Filed Weights   09/22/20 2257 09/26/20 0500  Weight: 78 kg 77.9 kg    History of present illness:  Wanda Ochoa is Wanda Ochoa 75 y.o. Caucasian female with medical history significant for asthma, depression and anxiety, hypertension and hypothyroidism, presented to the emergency room with acute onset of worsening dyspnea with associated cough and wheezing for the last couple days.  She was admitted and treated for Wanda Ochoa heart failure exacerbation and asthma exacerbation.  She's improved on 7/5 after steroids, nebs, and lasix.  Plan for outpatient cardiology follow up.    See below for additional details  Hospital Course:  Acute Hypoxic Respiratory Failure 2/2 Heart Failure Exacerbation and Asthma Exacerbation Bibasilar crackles today, wheezing improved Edema much improved today On RA today CR with mild pulm edema with bilateral trace effusions  CXR 7/4 with persistent prominent interstitial lung markings concerning for interstitial pulm edema, increased densities in RUL represent asymmetric pulm edema vs aatypical infection.   Basilar densities L>R, small effusions, ? Consolidation at medial L lung base. CXR 7/5 improved interstitial thickening, persistent L base and significantly improved RUL airpsace disease (see report) - trace bilateral effusions Discharge with doxycycline to cover possible atypical infection.  Complete steroid course, start advair for asthma.  Discharge with lasix for heart failure.   Echo with grade 2 diastolic dysfunction - started on ARB.  Follow with cardiology outpatient.  Referral placed. LE [place] negative for DVT   Elevated Pulmonary Artery Systolic Pressure Follow with cardiology outpatient after diuresis Repeat echo with cardiology outpatient once euvolemic  L base retrocardiac opacity Follow CXR in 3-4 weeks No clear signs of infection, follow   Hypothyroidism Synthroid   HTN Labetalol.  Added losartan, amlodipine, and lasix here.  Follow BP outpatient  (SBP 160's on day of discharge)   Dyslipidemia Statin   B12 def B12   Depression Celexa, seroquel, pamelor   Vitamin D Deficiency Vitamin D supplementation   Procedures: Echo IMPRESSIONS     1. Left ventricular ejection fraction, by estimation, is 55 to 60%. The  left ventricle has normal function. The left ventricle has no regional  wall motion abnormalities. Left ventricular diastolic parameters are  consistent with Grade II diastolic  dysfunction (pseudonormalization). The average left ventricular global  longitudinal strain is -19.9 %.   2. Right ventricular systolic function is normal. The right ventricular  size is normal. There is mildly elevated pulmonary artery systolic  pressure. The estimated right ventricular systolic pressure is 00.8 mmHg.   3. The mitral valve is normal in structure. Mild mitral valve  regurgitation.   4. The inferior vena cava is normal in size with <50% respiratory  variability, suggesting right atrial pressure of 8 mmHg.  LE  US IMPRESSION: Negative.  Consultations: none  Discharge Exam: Vitals:   09/26/20 0852 09/26/20 0941  BP: (!) 161/88   Pulse: 74   Resp: 17   Temp: 97.7 F (36.5 C)   SpO2: 97% 95%   Feeling better SOB better  General: No acute distress. Cardiovascular: Heart sounds show Wanda Ochoa regular rate, and rhythm Lungs: bibasilar crackles, no apparent wheezing Abdomen: Soft, nontender, nondistended Neurological: Alert and oriented 3. Moves all extremities 4. Cranial nerves II through XII grossly intact. Skin: Warm and dry. No rashes or lesions. Extremities: No clubbing or cyanosis. No edema (LE swelling much improved), trace sacral edema  Discharge Instructions   Discharge Instructions     (HEART FAILURE PATIENTS) Call MD:  Anytime you have any of the following symptoms: 1) 3 pound weight gain in 24 hours or 5 pounds in 1 week 2) shortness of breath, with or without Wanda Ochoa dry hacking cough 3) swelling in the hands, feet or stomach 4) if you have to sleep on extra pillows at night in order to breathe.   Complete by: As directed    Ambulatory referral to Cardiology   Complete by: As directed    Call MD for:  difficulty breathing, headache or visual disturbances   Complete by: As directed    Call MD for:  extreme fatigue   Complete by: As directed    Call MD for:  hives   Complete by: As directed    Call MD for:  persistant dizziness or light-headedness   Complete by: As directed    Call MD for:  persistant nausea and vomiting   Complete by: As directed    Call MD for:  redness, tenderness, or signs of infection (pain, swelling, redness, odor or green/yellow discharge around incision site)   Complete by: As directed    Call MD for:  severe uncontrolled pain   Complete by: As directed    Call MD for:  temperature >100.4   Complete by: As directed    Diet - low sodium heart healthy   Complete by: As directed    Discharge instructions   Complete by: As directed    You were seen for Wanda Ochoa  heart failure exacerbation and an asthma exacerbation.  You've improved with lasix.  We'll send you home with lasix in addition to Wanda Ochoa couple new blood pressure medicines.  Take lasix 40 mg daily for your heart failure.  We'll send you with potassium to take as well.  Check your weights daily.  If your weight increases more than 3 lbs in one day or more than 5 lbs in 1 week, please call your doctor and take and extra dose of lasix that day (and call your doctor about additional instructions).  For your blood pressure, we've started you on amlodipine and losartan.  Lasix should also help your blood pressure.   We'll send you home with advair for your asthma.  We'll also send you with Eula Jaster couple of days of antibiotics and steroids.  Please follow up with your PCP to discuss how long you'll need to take the advair (controller medicine).  Continue albuterol as needed for your shortness of breath.   We've referred you to cardiology.  You have vitamin D deficiency.  Follow outpatient with your PCP.    You'll need Almeda Ezra repeat CXR in Vassie Kugel couple of weeks with your PCP.  You need repeat labs within 1 weeks to follow up your kidney function and electrolytes.  Return for  new, recurrent, or worsening symptoms.  Please ask your PCP to request records from this hospitalization so they know what was done and what the next steps will be.   Increase activity slowly   Complete by: As directed       Allergies as of 09/26/2020       Reactions   Penicillins Anaphylaxis   Lipitor [atorvastatin] Rash   Severe rash on skin after taking approximately 2 weeks.         Medication List     TAKE these medications    Advair HFA 115-21 MCG/ACT inhaler Generic drug: fluticasone-salmeterol Inhale 2 puffs into the lungs 2 (two) times daily.   albuterol 108 (90 Base) MCG/ACT inhaler Commonly known as: Ventolin HFA Inhale 2 puffs into the lungs every 4 (four) hours as needed for wheezing or shortness of breath. INHALE 2  PUFFS INTO THE LUNGS EVERY 6 HOURS AS NEEDED What changed:  how much to take how to take this when to take this reasons to take this   amLODipine 5 MG tablet Commonly known as: NORVASC Take 1 tablet (5 mg total) by mouth daily.   aspirin 81 MG tablet Take 81 mg by mouth daily.   citalopram 10 MG tablet Commonly known as: CELEXA Take 10 mg by mouth daily.   cyanocobalamin 1000 MCG/ML injection Commonly known as: (VITAMIN B-12) Inject 1 mL (1,000 mcg total) into the muscle once Shirlyn Savin week for 4 doses. What changed: Another medication with the same name was removed. Continue taking this medication, and follow the directions you see here.   vitamin B-12 1000 MCG tablet Commonly known as: CYANOCOBALAMIN Take 1 tablet (1,000 mcg total) by mouth daily. Start taking on: September 30, 2020 What changed: Another medication with the same name was removed. Continue taking this medication, and follow the directions you see here.   doxycycline 100 MG tablet Commonly known as: VIBRA-TABS Take 1 tablet (100 mg total) by mouth every 12 (twelve) hours for 4 days.   furosemide 40 MG tablet Commonly known as: LASIX Take 1 tablet (40 mg total) by mouth daily.   hydrOXYzine 25 MG capsule Commonly known as: VISTARIL Take 25 mg by mouth daily as needed for anxiety.   labetalol 200 MG tablet Commonly known as: NORMODYNE Take 1 tablet (200 mg total) by mouth 2 (two) times daily.   levothyroxine 150 MCG tablet Commonly known as: SYNTHROID Take 1 tablet (150 mcg total) by mouth daily.   losartan 25 MG tablet Commonly known as: COZAAR Take 1 tablet (25 mg total) by mouth daily.   polyethylene glycol powder 17 GM/SCOOP powder Commonly known as: GLYCOLAX/MIRALAX Take 17 g by mouth daily.   potassium chloride 10 MEQ tablet Commonly known as: Klor-Con 10 Take 2 tablets (20 mEq total) by mouth daily.   pravastatin 20 MG tablet Commonly known as: PRAVACHOL Take 20 mg by mouth daily.   predniSONE  20 MG tablet Commonly known as: DELTASONE Take 2 tablets (40 mg total) by mouth daily with breakfast for 2 days. Start taking on: September 27, 2020   QUEtiapine 100 MG tablet Commonly known as: SEROQUEL Take 100 mg by mouth at bedtime.   Vitamin D3 25 MCG tablet Commonly known as: Vitamin D Take 1 tablet (1,000 Units total) by mouth daily.       ASK your doctor about these medications    nortriptyline 25 MG capsule Commonly known as: PAMELOR Take 3 capsule at bed time  Allergies  Allergen Reactions   Penicillins Anaphylaxis   Lipitor [Atorvastatin] Rash    Severe rash on skin after taking approximately 2 weeks.       The results of significant diagnostics from this hospitalization (including imaging, microbiology, ancillary and laboratory) are listed below for reference.    Significant Diagnostic Studies: DG Chest 2 View  Result Date: 09/22/2020 CLINICAL DATA:  Shortness of breath. EXAM: CHEST - 2 VIEW COMPARISON:  Chest x-ray 08/29/2020 FINDINGS: The heart size and mediastinal contours are unchanged. Aortic calcification. Prominent hilar vasculature. Left base/retrocardiac opacity. Interval development of diffusely increased interstitial markings. Interval development of bilateral trace pleural effusions. No pneumothorax. No acute osseous abnormality. IMPRESSION: 1. Mild pulmonary edema with bilateral trace pleural effusions. 2. Left base/retrocardiac opacity. Finding could represent infection/inflammation versus atelectasis. Followup PA and lateral chest X-ray is recommended in 3-4 weeks following therapy to ensure resolution and exclude underlying malignancy. 3.  Aortic Atherosclerosis (ICD10-I70.0). Electronically Signed   By: Iven Finn M.D.   On: 09/22/2020 23:45   DG Chest 2 View  Result Date: 08/29/2020 CLINICAL DATA:  Weakness. EXAM: CHEST - 2 VIEW COMPARISON:  May 19, 2015. FINDINGS: The heart size and mediastinal contours are within normal limits. Both  lungs are clear. The visualized skeletal structures are unremarkable. IMPRESSION: No active cardiopulmonary disease. Electronically Signed   By: Marijo Conception M.D.   On: 08/29/2020 14:54   CT Head Wo Contrast  Result Date: 08/29/2020 CLINICAL DATA:  Lower extremity weakness question stroke, knees collapsed, fall, falling Nathin Saran lot lately, history hypertension EXAM: CT HEAD WITHOUT CONTRAST TECHNIQUE: Contiguous axial images were obtained from the base of the skull through the vertex without intravenous contrast. Sagittal and coronal MPR images reconstructed from axial data set. COMPARISON:  08/28/2020 FINDINGS: Brain: Generalized atrophy. Normal ventricular morphology. No midline shift or mass effect. Small vessel chronic ischemic changes of deep cerebral white matter. No intracranial hemorrhage, mass lesion, evidence of acute infarction, or extra-axial fluid collection. Vascular: Mild atherosclerotic calcification of internal carotid arteries at skull base. Skull: Intact Sinuses/Orbits: Clear Other: N/Joice Nazario IMPRESSION: Atrophy with small vessel chronic ischemic changes of deep cerebral white matter. No acute intracranial abnormalities. Electronically Signed   By: Lavonia Dana M.D.   On: 08/29/2020 15:05   CT Head Wo Contrast  Result Date: 08/28/2020 CLINICAL DATA:  Fall. EXAM: CT HEAD WITHOUT CONTRAST CT CERVICAL SPINE WITHOUT CONTRAST TECHNIQUE: Multidetector CT imaging of the head and cervical spine was performed following the standard protocol without intravenous contrast. Multiplanar CT image reconstructions of the cervical spine were also generated. COMPARISON:  Head CT 08/21/2020.  Cervical spine CT 06/07/2019 FINDINGS: CT HEAD FINDINGS Brain: There is no evidence for acute hemorrhage, hydrocephalus, mass lesion, or abnormal extra-axial fluid collection. No definite CT evidence for acute infarction. Mild prominence of the lateral ventricles is stable, likely related to central atrophy Patchy low attenuation in  the deep hemispheric and periventricular white matter is nonspecific, but likely reflects chronic microvascular ischemic demyelination. Vascular: No hyperdense vessel or unexpected calcification. Skull: No evidence for fracture. No worrisome lytic or sclerotic lesion. Sinuses/Orbits: The visualized paranasal sinuses and mastoid air cells are clear. Visualized portions of the globes and intraorbital fat are unremarkable. Other: None. CT CERVICAL SPINE FINDINGS Alignment: Normal. Skull base and vertebrae: No acute fracture. No primary bone lesion or focal pathologic process. Soft tissues and spinal canal: No prevertebral fluid or swelling. No visible canal hematoma. Disc levels: Loss of disc height with endplate degeneration noted  C5-6. Upper chest: Pleuroparenchymal scarring noted in the lung apices. Other: None. IMPRESSION: 1. No acute intracranial abnormality. Atrophy with chronic small vessel white matter ischemic disease. 2. Degenerative changes in the cervical spine without fracture. Electronically Signed   By: Misty Stanley M.D.   On: 08/28/2020 13:27   CT Cervical Spine Wo Contrast  Result Date: 08/28/2020 CLINICAL DATA:  Fall. EXAM: CT HEAD WITHOUT CONTRAST CT CERVICAL SPINE WITHOUT CONTRAST TECHNIQUE: Multidetector CT imaging of the head and cervical spine was performed following the standard protocol without intravenous contrast. Multiplanar CT image reconstructions of the cervical spine were also generated. COMPARISON:  Head CT 08/21/2020.  Cervical spine CT 06/07/2019 FINDINGS: CT HEAD FINDINGS Brain: There is no evidence for acute hemorrhage, hydrocephalus, mass lesion, or abnormal extra-axial fluid collection. No definite CT evidence for acute infarction. Mild prominence of the lateral ventricles is stable, likely related to central atrophy Patchy low attenuation in the deep hemispheric and periventricular white matter is nonspecific, but likely reflects chronic microvascular ischemic demyelination.  Vascular: No hyperdense vessel or unexpected calcification. Skull: No evidence for fracture. No worrisome lytic or sclerotic lesion. Sinuses/Orbits: The visualized paranasal sinuses and mastoid air cells are clear. Visualized portions of the globes and intraorbital fat are unremarkable. Other: None. CT CERVICAL SPINE FINDINGS Alignment: Normal. Skull base and vertebrae: No acute fracture. No primary bone lesion or focal pathologic process. Soft tissues and spinal canal: No prevertebral fluid or swelling. No visible canal hematoma. Disc levels: Loss of disc height with endplate degeneration noted C5-6. Upper chest: Pleuroparenchymal scarring noted in the lung apices. Other: None. IMPRESSION: 1. No acute intracranial abnormality. Atrophy with chronic small vessel white matter ischemic disease. 2. Degenerative changes in the cervical spine without fracture. Electronically Signed   By: Misty Stanley M.D.   On: 08/28/2020 13:27   MR BRAIN WO CONTRAST  Result Date: 08/29/2020 CLINICAL DATA:  Lower extremity weakness EXAM: MRI HEAD WITHOUT CONTRAST TECHNIQUE: Multiplanar, multiecho pulse sequences of the brain and surrounding structures were obtained without intravenous contrast. COMPARISON:  Correlation made with recent CT imaging. Prior MRI is from 20/10 FINDINGS: Brain: There is no acute infarction or intracranial hemorrhage. There is no intracranial mass, mass effect, or edema. There is no hydrocephalus or extra-axial fluid collection. Prominence of the ventricles and sulci reflects generalized parenchymal volume loss. Patchy T2 hyperintensity in the supratentorial white matter is nonspecific but may reflect mild chronic microvascular ischemic changes. Focus of susceptibility hypointensity in the right subinsular white matter likely reflects chronic microhemorrhage. Vascular: Major vessel flow voids at the skull base are preserved. Skull and upper cervical spine: Normal marrow signal is preserved. Sinuses/Orbits:  Trace paranasal sinus mucosal thickening. Orbits are unremarkable. Other: Sella is unremarkable.  Mastoid air cells are clear. IMPRESSION: No acute infarction, hemorrhage, or mass. Mild chronic microvascular ischemic changes. Electronically Signed   By: Macy Mis M.D.   On: 08/29/2020 18:45   DG Hand 2 View Right  Result Date: 08/29/2020 CLINICAL DATA:  Status post fall. EXAM: RIGHT HAND - 2 VIEW COMPARISON:  None FINDINGS: There is marked dorsal soft tissue swelling. Cortical irregularity is identified along the posterior head of the second metacarpal bone concerning for mild impaction fracture. No dislocation. IMPRESSION: 1. Suspect mild impaction fracture involving the posterior head of the second metacarpal bone. Correlation with exact site of tenderness advised. 2. Marked dorsal soft tissue swelling. Electronically Signed   By: Kerby Moors M.D.   On: 08/29/2020 14:57   US Venous Img  Lower Bilateral (DVT)  Result Date: 09/23/2020 CLINICAL DATA:  Evaluated edema.  Rule out DVT. EXAM: BILATERAL LOWER EXTREMITY VENOUS DOPPLER ULTRASOUND TECHNIQUE: Gray-scale sonography with compression, as well as color and duplex ultrasound, were performed to evaluate the deep venous system(s) from the level of the common femoral vein through the popliteal and proximal calf veins. COMPARISON:  None. FINDINGS: VENOUS Normal compressibility of the common femoral, superficial femoral, and popliteal veins, as well as the visualized calf veins. Visualized portions of profunda femoral vein and great saphenous vein unremarkable. No filling defects to suggest DVT on grayscale or color Doppler imaging. Doppler waveforms show normal direction of venous flow, normal respiratory plasticity and response to augmentation. Limited views of the contralateral common femoral vein are unremarkable. OTHER None. Limitations: Suboptimal visualization of the left peroneal vein. IMPRESSION: Negative. Electronically Signed   By: Kerby Moors M.D.   On: 09/23/2020 09:48   DG Chest Port 1 View  Result Date: 09/26/2020 CLINICAL DATA:  Shortness of breath EXAM: PORTABLE CHEST 1 VIEW COMPARISON:  09/25/2020 FINDINGS: Midline trachea. Normal heart size for level of inspiration. Atherosclerosis in the transverse aorta. Trace bilateral pleural effusions again identified. Improvement in pulmonary interstitial thickening. Persistent medial left lower and improved right upper lobe mild airspace opacities. IMPRESSION: Improved interstitial thickening, likely due to decreased to resolved pulmonary edema. Persistent left base and significantly improved right upper lobe airspace disease, atelectasis versus infection. Aortic Atherosclerosis (ICD10-I70.0). Suspicion of similar trace bilateral pleural effusions. Electronically Signed   By: Abigail Miyamoto M.D.   On: 09/26/2020 07:23   DG Chest Port 1 View  Result Date: 09/25/2020 CLINICAL DATA:  Shortness of breath. EXAM: PORTABLE CHEST 1 VIEW COMPARISON:  09/22/2020 FINDINGS: Slightly increased interstitial densities in the right upper lung. Prominent interstitial lung markings are again noted in both lungs. Persistent densities at the left lung base. The medial aspect of left hemidiaphragm is obscured. Mild blunting at the right costophrenic angle. IMPRESSION: 1. Persistent prominent interstitial lung markings are concerning for interstitial pulmonary edema. Slightly increased densities in the right upper lung could represent asymmetric pulmonary edema versus atypical infection. 2. Persistent basilar densities, left side greater than right. Findings are compatible with small effusions and there may be some consolidation at the medial left lung base. Electronically Signed   By: Markus Daft M.D.   On: 09/25/2020 08:44   DG Hand Complete Right  Result Date: 08/30/2020 CLINICAL DATA:  Right hand pain. EXAM: RIGHT HAND - COMPLETE 3+ VIEW COMPARISON:  August 29, 2020. FINDINGS: The right hand has been splinted. The  abnormality involving the posterior portion of the distal head of the second metacarpal noted on prior exam is not well visualized on this radiograph. Dorsal soft tissue swelling is noted. No other definite bony abnormality is noted. IMPRESSION: Right hand has been splinted. Dorsal soft tissue swelling is noted. Abnormality involving posterior portion of distal head of second metacarpal noted on prior exam is not well visualized on this radiograph. Electronically Signed   By: Marijo Conception M.D.   On: 08/30/2020 14:14   ECHOCARDIOGRAM COMPLETE  Result Date: 09/24/2020    ECHOCARDIOGRAM REPORT   Patient Name:   Wanda Ochoa Date of Exam: 09/23/2020 Medical Rec #:  [id number]          Height:       69.0 in Accession #:    [id number]         Weight:       172.0 lb  Date of Birth:  1945/04/02           BSA:          1.938 m Patient Age:    6 years           BP:           163/78 mmHg Patient Gender: F                  HR:           71 bpm. Exam Location:  [facility] Procedure: 2D Echo and Strain Analysis Indications:     CHF I50.31  History:         Patient has no prior history of Echocardiogram examinations.  Sonographer:     Kathlen Brunswick RDCS Referring Phys:  [id number] [place] Diagnosing Phys: Ida Rogue MD  Sonographer Comments: Global longitudinal strain was attempted. IMPRESSIONS  1. Left ventricular ejection fraction, by estimation, is 55 to 60%. The left ventricle has normal function. The left ventricle has no regional wall motion abnormalities. Left ventricular diastolic parameters are consistent with Grade II diastolic dysfunction (pseudonormalization). The average left ventricular global longitudinal strain is -19.9 %.  2. Right ventricular systolic function is normal. The right ventricular size is normal. There is mildly elevated pulmonary artery systolic pressure. The estimated right ventricular systolic pressure is 69.7 mmHg.  3. The mitral valve is normal in structure. Mild mitral valve  regurgitation.  4. The inferior vena cava is normal in size with <50% respiratory variability, suggesting right atrial pressure of 8 mmHg. FINDINGS  Left Ventricle: Left ventricular ejection fraction, by estimation, is 55 to 60%. The left ventricle has normal function. The left ventricle has no regional wall motion abnormalities. The average left ventricular global longitudinal strain is -19.9 %. The left ventricular internal cavity size was normal in size. There is borderline left ventricular hypertrophy. Left ventricular diastolic parameters are consistent with Grade II diastolic dysfunction (pseudonormalization). Right Ventricle: The right ventricular size is normal. No increase in right ventricular wall thickness. Right ventricular systolic function is normal. There is mildly elevated pulmonary artery systolic pressure. The tricuspid regurgitant velocity is 2.87  m/s, and with an assumed right atrial pressure of 8 mmHg, the estimated right ventricular systolic pressure is 94.8 mmHg. Left Atrium: Left atrial size was normal in size. Right Atrium: Right atrial size was normal in size. Pericardium: There is no evidence of pericardial effusion. Mitral Valve: The mitral valve is normal in structure. Mild mitral valve regurgitation. No evidence of mitral valve stenosis. Tricuspid Valve: The tricuspid valve is normal in structure. Tricuspid valve regurgitation is not demonstrated. No evidence of tricuspid stenosis. Aortic Valve: The aortic valve is normal in structure. Aortic valve regurgitation is not visualized. Mild aortic valve sclerosis is present, with no evidence of aortic valve stenosis. Aortic valve peak gradient measures 7.4 mmHg. Pulmonic Valve: The pulmonic valve was normal in structure. Pulmonic valve regurgitation is trivial. No evidence of pulmonic stenosis. Aorta: The aortic root is normal in size and structure. Venous: The inferior vena cava is normal in size with less than 50% respiratory variability,  suggesting right atrial pressure of 8 mmHg. IAS/Shunts: No atrial level shunt detected by color flow Doppler.  LEFT VENTRICLE PLAX 2D LVIDd:         4.71 cm  Diastology LVIDs:         3.15 cm  LV e' medial:    7.72 cm/s LV PW:  1.05 cm  LV E/e' medial:  13.5 LV IVS:        1.18 cm  LV e' lateral:   6.64 cm/s LVOT diam:     2.10 cm  LV E/e' lateral: 15.7 LV SV:         79 LV SV Index:   41       2D Longitudinal Strain LVOT Area:     3.46 cm 2D Strain GLS Avg:     -19.9 %  RIGHT VENTRICLE RV Basal diam:  3.10 cm RV S prime:     13.20 cm/s TAPSE (M-mode): 2.6 cm LEFT ATRIUM           Index       RIGHT ATRIUM           Index LA diam:      3.80 cm 1.96 cm/m  RA Area:     16.20 cm LA Vol (A2C): 64.5 ml 33.29 ml/m RA Volume:   41.40 ml  21.37 ml/m LA Vol (A4C): 61.6 ml 31.79 ml/m  AORTIC VALVE                PULMONIC VALVE AV Area (Vmax): 2.48 cm    PV Vmax:       0.86 m/s AV Vmax:        136.00 cm/s PV Peak grad:  2.9 mmHg AV Peak Grad:   7.4 mmHg LVOT Vmax:      97.30 cm/s LVOT Vmean:     65.100 cm/s LVOT VTI:       0.228 m  AORTA Ao Root diam: 3.30 cm Ao Asc diam:  3.20 cm MITRAL VALVE                 TRICUSPID VALVE MV Area (PHT): 5.62 cm      TV Peak grad:   31.8 mmHg MV Decel Time: 135 msec      TV Vmax:        2.82 m/s MR Peak grad:    155.8 mmHg  TR Peak grad:   32.9 mmHg MR Mean grad:    100.0 mmHg  TR Vmax:        287.00 cm/s MR Vmax:         624.00 cm/s MR Vmean:        470.0 cm/s  SHUNTS MR PISA:         1.57 cm    Systemic VTI:  0.23 m MR PISA Eff ROA: 8 mm       Systemic Diam: 2.10 cm MR PISA Radius:  0.50 cm MV E velocity: 104.00 cm/s MV Murrel Freet velocity: 101.00 cm/s MV E/Dhairya Corales ratio:  1.03 Ida Rogue MD Electronically signed by Ida Rogue MD Signature Date/Time: 09/24/2020/9:27:36 AM    Final     Microbiology: Recent Results (from the past 240 hour(s))  Resp Panel by RT-PCR (Flu Van Seymore&B, Covid) Nasopharyngeal Swab     Status: None   Collection Time: 09/23/20 12:32 AM   Specimen:  Nasopharyngeal Swab; Nasopharyngeal(NP) swabs in vial transport medium  Result Value Ref Range Status   SARS Coronavirus 2 by RT PCR NEGATIVE NEGATIVE Final    Comment: (NOTE) SARS-CoV-2 target nucleic acids are NOT DETECTED.  The SARS-CoV-2 RNA is generally detectable in upper respiratory specimens during the acute phase of infection. The lowest concentration of SARS-CoV-2 viral copies this assay can detect is 138 copies/mL. Rees Santistevan negative result does not preclude SARS-Cov-2 infection and should not be used as the sole basis  for treatment or other patient management decisions. Placido Hangartner negative result may occur with  improper specimen collection/handling, submission of specimen other than nasopharyngeal swab, presence of viral mutation(s) within the areas targeted by this assay, and inadequate number of viral copies(<138 copies/mL). Odis Turck negative result must be combined with clinical observations, patient history, and epidemiological information. The expected result is Negative.  Fact Sheet for Patients:  EntrepreneurPulse.com.au  Fact Sheet for Healthcare Providers:  IncredibleEmployment.be  This test is no t yet approved or cleared by the [place] FDA and  has been authorized for detection and/or diagnosis of SARS-CoV-2 by FDA under an Emergency Use Authorization (EUA). This EUA will remain  in effect (meaning this test can be used) for the duration of the COVID-19 declaration under Section 564(b)(1) of the Act, 21 U.S.C.section 360bbb-3(b)(1), unless the authorization is terminated  or revoked sooner.       Influenza Kenecia Barren by PCR NEGATIVE NEGATIVE Final   Influenza B by PCR NEGATIVE NEGATIVE Final    Comment: (NOTE) The Xpert Xpress SARS-CoV-2/FLU/RSV plus assay is intended as an aid in the diagnosis of influenza from Nasopharyngeal swab specimens and should not be used as Brendt Dible sole basis for treatment. Nasal washings and aspirates are unacceptable for  Xpert Xpress SARS-CoV-2/FLU/RSV testing.  Fact Sheet for Patients: EntrepreneurPulse.com.au  Fact Sheet for Healthcare Providers: IncredibleEmployment.be  This test is not yet approved or cleared by the [place] FDA and has been authorized for detection and/or diagnosis of SARS-CoV-2 by FDA under an Emergency Use Authorization (EUA). This EUA will remain in effect (meaning this test can be used) for the duration of the COVID-19 declaration under Section 564(b)(1) of the Act, 21 U.S.C. section 360bbb-3(b)(1), unless the authorization is terminated or revoked.  Performed at [facility], [address] [place], [place] [address]   Culture, blood (routine x 2)     Status: None (Preliminary result)   Collection Time: 09/23/20  1:27 AM   Specimen: BLOOD  Result Value Ref Range Status   Specimen Description BLOOD RIGHT HAND  Final   Special Requests   Final    BOTTLES DRAWN AEROBIC AND ANAEROBIC Blood Culture adequate volume   Culture   Final    NO GROWTH 3 DAYS Performed at [facility], [address] [place], [place] [address]    Report Status PENDING  Incomplete  Culture, blood (routine x 2)     Status: None (Preliminary result)   Collection Time: 09/23/20  1:27 AM   Specimen: BLOOD  Result Value Ref Range Status   Specimen Description BLOOD LEFT ANTECUBITAL  Final   Special Requests   Final    BOTTLES DRAWN AEROBIC AND ANAEROBIC Blood Culture adequate volume   Culture   Final    NO GROWTH 3 DAYS Performed at [facility], [place], [place] [address]    Report Status PENDING  Incomplete     Labs: Basic Metabolic Panel: Recent Labs  Lab 09/22/20 2259 09/23/20 0832 09/24/20 0700 09/25/20 0356 09/26/20 0416  NA 133* 135 136 135 138  K 4.0 3.9 3.5 3.1* 3.3*  CL 99 100 97* 98 96*  CO2 25 25 27 28  35*  GLUCOSE 112* 138* 132* 102* 90  BUN 16 15 27* 30* 25*  CREATININE 0.91 0.85  1.14* 1.18* 1.16*  CALCIUM 8.7* 8.9 8.7* 8.5* 8.6*  MG  --   --  1.9 1.9 1.7  PHOS  --   --  4.9* 3.3 3.3  Liver Function Tests: Recent Labs  Lab 09/24/20 0700 09/25/20 0356 09/26/20 0416  AST 17 16 15   ALT 13 12 13   ALKPHOS 112 111 92  BILITOT 0.5 0.5 0.6  PROT 7.0 6.5 6.3*  ALBUMIN 3.4* 3.2* 3.1*   No results for input(s): LIPASE, AMYLASE in the last 168 hours. No results for input(s): AMMONIA in the last 168 hours. CBC: Recent Labs  Lab 09/22/20 2259 09/23/20 0607 09/24/20 0700 09/25/20 0356 09/26/20 0416  WBC 8.7 3.8* 9.6 10.7* 8.0  NEUTROABS  --   --  7.8* 7.2 4.2  HGB 8.4* 7.9* 8.7* 8.6* 8.6*  HCT 25.2* 23.3* 25.9* 25.7* 25.3*  MCV 99.6 97.5 99.2 97.7 97.7  PLT 200 194 235 253 249   Cardiac Enzymes: No results for input(s): CKTOTAL, CKMB, CKMBINDEX, TROPONINI in the last 168 hours. BNP: BNP (last 3 results) Recent Labs    09/22/20 0032  BNP 418.5*    ProBNP (last 3 results) No results for input(s): PROBNP in the last 8760 hours.  CBG: No results for input(s): GLUCAP in the last 168 hours.     Signed:  Fayrene Helper MD.  [facility] 09/26/2020, 10:18 AM

## 2020-09-26 NOTE — Discharge Planning (Signed)
Patient remained 95% or greater on room air, even with activity.  No need for home O2 - if discharged today.

## 2020-09-26 NOTE — Progress Notes (Signed)
Physical Therapy Treatment Patient Details Name: Wanda Ochoa MRN: [id number] DOB: 01-28-1946 Today's Date: 09/26/2020    History of Present Illness Patient is a 75 y.o.  with medical history significant for asthma, depression and anxiety, hypertension and hypothyroidism, presented to the emergency room with acute onset of worsening dyspnea with associated cough and wheezing for the last couple days. Found to have acute hypoxic respiratory failure with heart failure exacerbation and asthma exacerbation. Patient reports she was recently discharged from a rehab center to home    PT Comments    Patient received in bed doing crossword puzzles. Wants to get out of bed. She is mod independent with bed mobility. Transfers sit to stand with min assist. Difficulty getting initial standing and balance. Patient ambulated 225 feet with RW and min guard. No lob or significant sob during mobility. O2 sats after ambulation at 93% on room air. Patient will continue to benefit from skilled PT while here to improve strength, balance and functional independence for safe return home.         Follow Up Recommendations  Home health PT;Supervision for mobility/OOB     Equipment Recommendations  None recommended by PT    Recommendations for Other Services       Precautions / Restrictions Precautions Precautions: Fall    Mobility  Bed Mobility Overal bed mobility: Modified Independent Bed Mobility: Supine to Sit     Supine to sit: Modified independent (Device/Increase time)          Transfers Overall transfer level: Needs assistance Equipment used: Rolling walker (2 wheeled) Transfers: Sit to/from Stand Sit to Stand: Min assist         General transfer comment: min assist for sit to stand. Unable to get fully standing on first attempt.  Ambulation/Gait Ambulation/Gait assistance: Min guard Gait Distance (Feet): 225 Feet Assistive device: Rolling walker (2 wheeled) Gait  Pattern/deviations: Step-through pattern Gait velocity: WFL   General Gait Details: Patient lightly holding to walker with R UE due to fracture. No O2 needed this session. Much improved distance and feeling stronger this session.   Stairs             Wheelchair Mobility    Modified Rankin (Stroke Patients Only)       Balance Overall balance assessment: History of Falls Sitting-balance support: Feet supported Sitting balance-Leahy Scale: Normal     Standing balance support: Bilateral upper extremity supported Standing balance-Leahy Scale: Fair Standing balance comment: Benefits from AD, and min guard for safety                            Cognition Arousal/Alertness: Awake/alert Behavior During Therapy: WFL for tasks assessed/performed Overall Cognitive Status: Within Functional Limits for tasks assessed                                 General Comments: patient able to follow single step commands without difficulty      Exercises      General Comments        Pertinent Vitals/Pain Pain Assessment: No/denies pain    Home Living                      Prior Function            PT Goals (current goals can now be found in the care plan section) Acute Rehab  PT Goals Patient Stated Goal: to return home PT Goal Formulation: With patient Time For Goal Achievement: 10/08/20 Potential to Achieve Goals: Good Progress towards PT goals: Progressing toward goals    Frequency    Min 2X/week      PT Plan Current plan remains appropriate    Co-evaluation              AM-PAC PT "6 Clicks" Mobility   Outcome Measure  Help needed turning from your back to your side while in a flat bed without using bedrails?: None Help needed moving from lying on your back to sitting on the side of a flat bed without using bedrails?: None Help needed moving to and from a bed to a chair (including a wheelchair)?: A Little Help needed  standing up from a chair using your arms (e.g., wheelchair or bedside chair)?: A Little Help needed to walk in hospital room?: A Little Help needed climbing 3-5 steps with a railing? : A Little 6 Click Score: 20    End of Session Equipment Utilized During Treatment: Gait belt Activity Tolerance: Patient tolerated treatment well Patient left: in chair;with call bell/phone within reach Nurse Communication: Mobility status PT Visit Diagnosis: Muscle weakness (generalized) (M62.81);Repeated falls (R29.6);Unsteadiness on feet (R26.81)     Time: 4045-9136 PT Time Calculation (min) (ACUTE ONLY): 15 min  Charges:  $Gait Training: 8-22 mins                    Kayleb Warshaw, PT, GCS 09/26/20,10:28 AM

## 2020-09-27 ENCOUNTER — Telehealth: Payer: Self-pay | Admitting: Family Medicine

## 2020-09-27 ENCOUNTER — Other Ambulatory Visit: Payer: Self-pay

## 2020-09-27 DIAGNOSIS — J9601 Acute respiratory failure with hypoxia: Secondary | ICD-10-CM | POA: Diagnosis not present

## 2020-09-27 DIAGNOSIS — I5031 Acute diastolic (congestive) heart failure: Secondary | ICD-10-CM | POA: Diagnosis not present

## 2020-09-27 DIAGNOSIS — I509 Heart failure, unspecified: Secondary | ICD-10-CM

## 2020-09-27 DIAGNOSIS — E559 Vitamin D deficiency, unspecified: Secondary | ICD-10-CM | POA: Diagnosis not present

## 2020-09-27 DIAGNOSIS — E785 Hyperlipidemia, unspecified: Secondary | ICD-10-CM | POA: Diagnosis not present

## 2020-09-27 DIAGNOSIS — J45901 Unspecified asthma with (acute) exacerbation: Secondary | ICD-10-CM | POA: Diagnosis not present

## 2020-09-27 DIAGNOSIS — I700 Atherosclerosis of aorta: Secondary | ICD-10-CM | POA: Diagnosis not present

## 2020-09-27 DIAGNOSIS — M1711 Unilateral primary osteoarthritis, right knee: Secondary | ICD-10-CM | POA: Diagnosis not present

## 2020-09-27 DIAGNOSIS — I110 Hypertensive heart disease with heart failure: Secondary | ICD-10-CM | POA: Diagnosis not present

## 2020-09-27 DIAGNOSIS — F32A Depression, unspecified: Secondary | ICD-10-CM | POA: Diagnosis not present

## 2020-09-27 DIAGNOSIS — S6291XD Unspecified fracture of right wrist and hand, subsequent encounter for fracture with routine healing: Secondary | ICD-10-CM | POA: Diagnosis not present

## 2020-09-27 DIAGNOSIS — E538 Deficiency of other specified B group vitamins: Secondary | ICD-10-CM | POA: Diagnosis not present

## 2020-09-27 DIAGNOSIS — E039 Hypothyroidism, unspecified: Secondary | ICD-10-CM | POA: Diagnosis not present

## 2020-09-27 DIAGNOSIS — Z9181 History of falling: Secondary | ICD-10-CM | POA: Diagnosis not present

## 2020-09-27 DIAGNOSIS — F419 Anxiety disorder, unspecified: Secondary | ICD-10-CM | POA: Diagnosis not present

## 2020-09-27 NOTE — Telephone Encounter (Signed)
Copied from [place] [phone] [phone]. Topic: Quick Communication - Home Health Verbal Orders >> Sep 27, 2020  2:39 PM Leward Quan A wrote: Caller/Agency: Mardene Celeste // [place] Number: [phone] ok to LM  Requesting OT/PT/Skilled Nursing/Social Work/Speech Therapy: nursing and [facility] Aide Frequency: nursing 1 w 6 // HHA 2 w 9

## 2020-09-28 ENCOUNTER — Ambulatory Visit (INDEPENDENT_AMBULATORY_CARE_PROVIDER_SITE_OTHER): Payer: Medicare Other | Admitting: Family Medicine

## 2020-09-28 ENCOUNTER — Encounter: Payer: Self-pay | Admitting: Family Medicine

## 2020-09-28 ENCOUNTER — Other Ambulatory Visit: Payer: Self-pay

## 2020-09-28 VITALS — BP 149/76 | HR 78 | Resp 16 | Wt 169.6 lb

## 2020-09-28 DIAGNOSIS — E538 Deficiency of other specified B group vitamins: Secondary | ICD-10-CM

## 2020-09-28 DIAGNOSIS — I5031 Acute diastolic (congestive) heart failure: Secondary | ICD-10-CM

## 2020-09-28 DIAGNOSIS — I10 Essential (primary) hypertension: Secondary | ICD-10-CM

## 2020-09-28 DIAGNOSIS — E039 Hypothyroidism, unspecified: Secondary | ICD-10-CM

## 2020-09-28 DIAGNOSIS — I110 Hypertensive heart disease with heart failure: Secondary | ICD-10-CM | POA: Diagnosis not present

## 2020-09-28 DIAGNOSIS — F332 Major depressive disorder, recurrent severe without psychotic features: Secondary | ICD-10-CM | POA: Diagnosis not present

## 2020-09-28 DIAGNOSIS — J45901 Unspecified asthma with (acute) exacerbation: Secondary | ICD-10-CM | POA: Diagnosis not present

## 2020-09-28 DIAGNOSIS — I700 Atherosclerosis of aorta: Secondary | ICD-10-CM | POA: Diagnosis not present

## 2020-09-28 DIAGNOSIS — M1711 Unilateral primary osteoarthritis, right knee: Secondary | ICD-10-CM | POA: Diagnosis not present

## 2020-09-28 DIAGNOSIS — R0902 Hypoxemia: Secondary | ICD-10-CM

## 2020-09-28 DIAGNOSIS — J9601 Acute respiratory failure with hypoxia: Secondary | ICD-10-CM | POA: Diagnosis not present

## 2020-09-28 DIAGNOSIS — J4541 Moderate persistent asthma with (acute) exacerbation: Secondary | ICD-10-CM

## 2020-09-28 LAB — CULTURE, BLOOD (ROUTINE X 2)
Culture: NO GROWTH
Culture: NO GROWTH
Special Requests: ADEQUATE
Special Requests: ADEQUATE

## 2020-09-28 NOTE — Progress Notes (Addendum)
Established patient visit      Patient: Wanda Ochoa   DOB: [birth year]   75 y.o. Female  MRN:  [id number]  Visit Date: 09/28/2020    Today's healthcare provider: Vernie Murders, PA-C     No chief complaint on file.    Subjective      HPI   Follow up Hospitalization    Patient was admitted to [facility] on 09/22/20 and discharged on 09/26/20.  She was treated for Acute Hypoxic Respiratory Failure 2/2 Heart Failure  Exacerbation and Asthma Exacerbation.  Treatment for this included lasix 40mg  and started on Co-Q10 20mg .  Telephone follow up was done on N/A  She reports excellent compliance with treatment.  She reports this condition is improved.    --------------------------------------------------------------------------- -------------- -     Patient Active Problem List    Diagnosis Date Noted    Acute asthma exacerbation 09/23/2020    Acute congestive heart failure ([place])     Vitamin B12 deficiency 08/31/2020    Frequent falls 08/30/2020    Weakness 02/77/4128    Complicated grieving 78/67/6720    Drug reaction, subsequent encounter 09/23/2019    Rash and nonspecific skin eruption 09/23/2019    Stable angina pectoris ([place]) 09/10/2019    Stage 3a chronic kidney disease ([place]) 09/10/2019    Idiopathic gout involving toe of right foot 12/07/2018    Primary osteoarthritis of right knee 03/07/2017    Hematuria 09/11/2016    Venous insufficiency of both lower extremities 09/04/2016    Severe recurrent major depression without psychotic features ([place])     Hypercholesteremia 05/02/2015    Mitral valve prolapse 02/21/2015    Allergic rhinitis 12/24/2014    Osteopenia 12/24/2014    Hypothyroidism 11/24/2014    Generalized anxiety disorder 02/24/2012    Nocturnal hypoxemia 01/20/2012    Abdominal mass 01/20/2012    Shortness of breath 01/20/2012    Temporary cerebral vascular dysfunction 01/02/2009    Benign hypertension 03/28/2008    Asthma, exogenous 10/22/2007     Adaptive colitis 03/12/2003     Past Medical History:   Diagnosis Date    Anxiety     Arthritis     right knee    Asthma     Depression     catatonic depression per pt report    HTN (hypertension)     Thyroid disease      Past Surgical History:   Procedure Laterality Date    ABDOMINAL HYSTERECTOMY  1990    BLADDER SUSPENSION  1990    BREAST CYST ASPIRATION Right 1985    BREAST EXCISIONAL BIOPSY Right 1967    EXCISIONAL BX - NEG    BREAST SURGERY Right 1967    Lumpectomy    CESAREAN SECTION      COLONOSCOPY WITH PROPOFOL N/A 12/18/2018    Procedure: COLONOSCOPY WITH PROPOFOL;  Surgeon: Lollie Sails, MD;   Location: [facility] [place] END[facility]COPY;  Service: Endoscopy;  Laterality: N/A;    HEMORRHOIDECTOMY WITH HEMORRHOID BANDING  1988     Family History   Problem Relation Age of Onset    Depression Father     Suicidality Father     Depression Sister     Breast cancer Sister 58    Autism Son     Heart disease Mother     Colon cancer Mother     Hypertension Mother     Anxiety disorder Mother     Mental illness Daughter  Allergies Brother     Allergies Sister     Asthma Maternal Uncle     Heart disease Maternal Grandfather     Heart disease Maternal Grandmother     Breast cancer Paternal Grandmother     Breast cancer Other      Allergies   Allergen Reactions    Penicillins Anaphylaxis    Lipitor [Atorvastatin] Rash     Severe rash on skin after taking approximately 2 weeks.            Medications:  Outpatient Medications Prior to Visit   Medication Sig    albuterol (VENTOLIN HFA) 108 (90 Base) MCG/ACT inhaler Inhale 2 puffs  into the lungs every 4 (four) hours as needed for wheezing or shortness of  breath. INHALE 2 PUFFS INTO THE LUNGS EVERY 6 HOURS AS NEEDED    amLODipine (NORVASC) 5 MG tablet Take 1 tablet (5 mg total) by mouth  daily.    aspirin 81 MG tablet Take 81 mg by mouth daily.    cholecalciferol (VITAMIN D) 25 MCG tablet Take 1 tablet (1,000 Units  total)  by mouth daily.    citalopram (CELEXA) 10 MG tablet Take 10 mg by mouth daily.    cyanocobalamin (,VITAMIN B-12,) 1000 MCG/ML injection Inject 1 mL (1,000  mcg total) into the muscle once a week for 4 doses.    doxycycline (VIBRA-TABS) 100 MG tablet Take 1 tablet (100 mg total) by  mouth every 12 (twelve) hours for 4 days.    fluticasone-salmeterol (ADVAIR HFA) 115-21 MCG/ACT inhaler Inhale 2 puffs  into the lungs 2 (two) times daily.    furosemide (LASIX) 40 MG tablet Take 1 tablet (40 mg total) by mouth  daily.    hydrOXYzine (VISTARIL) 25 MG capsule Take 25 mg by mouth daily as needed  for anxiety.    labetalol (NORMODYNE) 200 MG tablet Take 1 tablet (200 mg total) by mouth  2 (two) times daily.    levothyroxine (SYNTHROID) 150 MCG tablet Take 1 tablet (150 mcg total) by  mouth daily.    losartan (COZAAR) 25 MG tablet Take 1 tablet (25 mg total) by mouth  daily.    nortriptyline (PAMELOR) 25 MG capsule Take 3 capsule at bed time (Patient  taking differently: Take 75 mg by mouth at bedtime.)    polyethylene glycol powder (GLYCOLAX/MIRALAX) 17 GM/SCOOP powder Take 17  g by mouth daily.    potassium chloride (KLOR-CON 10) 10 MEQ tablet Take 2 tablets (20 mEq  total) by mouth daily.    pravastatin (PRAVACHOL) 20 MG tablet Take 20 mg by mouth daily.    predniSONE (DELTASONE) 20 MG tablet Take 2 tablets (40 mg total) by mouth  daily with breakfast for 2 days.    QUEtiapine (SEROQUEL) 100 MG tablet Take 100 mg by mouth at bedtime.    [START ON 09/30/2020] vitamin B-12 (CYANOCOBALAMIN) 1000 MCG tablet Take 1  tablet (1,000 mcg total) by mouth daily.     No facility-administered medications prior to visit.       Review of Systems  Constitutional:  Negative for fever.  HENT: Negative.    Respiratory:  Positive for shortness of breath.   Cardiovascular:  Negative for chest pain.  Gastrointestinal: Negative.   Skin: Negative.   Psychiatric/Behavioral:  Positive for depression.      Last CBC  Lab  Results   Component Value Date    WBC 8.0 09/26/2020    HGB 8.6 (L) 09/26/2020  HCT 25.3 (L) 09/26/2020    MCV 97.7 09/26/2020    MCH 33.2 09/26/2020    RDW 12.8 09/26/2020    PLT 249 11/91/4782     Last metabolic panel  Lab Results   Component Value Date    GLUCOSE 90 09/26/2020    NA 138 09/26/2020    K 3.3 (L) 09/26/2020    CL 96 (L) 09/26/2020    CO2 35 (H) 09/26/2020    BUN 25 (H) 09/26/2020    CREATININE 1.16 (H) 09/26/2020    GFRNONAA 49 (L) 09/26/2020    GFRAA 56 (L) 09/21/2019    CALCIUM 8.6 (L) 09/26/2020    PHOS 3.3 09/26/2020    PROT 6.3 (L) 09/26/2020    ALBUMIN 3.1 (L) 09/26/2020    LABGLOB 3.1 09/21/2019    AGRATIO 1.4 09/21/2019    BILITOT 0.6 09/26/2020    ALKPHOS 92 09/26/2020    AST 15 09/26/2020    ALT 13 09/26/2020    ANIONGAP 7 09/26/2020          Objective      Today's Vitals   09/28/20 1524  BP: (!) 149/76  Pulse: 78  Resp: 16  SpO2: 93%  Weight: 169 lb 9.6 oz (76.9 kg)   Body mass index is 25.05 kg/m.  BP Readings from Last 3 Encounters:  10/10/20 (!) 146/78  10/02/20 (!) 180/76  09/28/20 (!) 149/76   Wt Readings from Last 3 Encounters:  10/10/20 165 lb (74.8 kg)  10/02/20 169 lb 8.5 oz (76.9 kg)  09/28/20 169 lb 9.6 oz (76.9 kg)         Physical Exam HENT:     Head: Normocephalic.     Right Ear: Tympanic membrane normal.     Left Ear: Tympanic membrane normal.     Nose: Nose normal.     Mouth/Throat:     Mouth: Mucous membranes are moist.     Pharynx: Oropharynx is clear.  Eyes:     Conjunctiva/sclera: Conjunctivae normal.  Cardiovascular:     Rate and Rhythm: Normal rate and regular rhythm.  Pulmonary:     Effort: Pulmonary effort is normal.     Comments: Slight coarseness to breath sounds. No wheeze, rales or rhonchi. Abdominal:     General: Bowel sounds are normal.     Palpations: Abdomen is soft.  Musculoskeletal:        General: Normal range of motion.     Cervical back: Neck supple.  Skin:     Findings: No rash.  Neurological:     Mental Status: She is alert and oriented to person, place, and time.  Psychiatric:        Mood and Affect: Mood normal. Affect is blunt.        Speech: Speech normal.        Behavior: Behavior normal.        Judgment: Judgment normal.   No results found for any visits on 09/28/20.   Assessment & Plan       1. Hypoxia Hospitalized 09-22-20 to 09-26-20 with dyspnea and initial pulse oximetry of 80% on room air that improved to 93% on 3-4 LPM O2 by nasal cannula. BNP was over 400 and she improved with steroids, nebulizer treatments and Lasix diuresis for heart failure exacerbation. States she is improved with Lasix 40 mg qd with Klor-Con 10 meq 2 tablets daily and Albuterol Inhaler 2 puffs qd 4 hours prn. Given Doxycyline to cover possible atypical infection and started  Advair at discharge from the hospital.  2. Moderate persistent asthma with acute exacerbation Improved on Advair 115-21 two puffs BID and using Albuterol Inhaler prn. Pulse oximetry at 93% today on room air. Monitor for need of pulmonary referral.  3. Acute diastolic congestive heart failure (HCC) Echocardiogram during hospitalization showed Grade 2 Diastolic dysfunction. Was started on ARB (Losartan) and referral placed for cardiology evaluation. Still taking KCL supplement and Lasix daily. No significant edema today.  4. Hypothyroidism, unspecified type Will get follow up labs and continue Synthroid 150 mcg qd.  5. Benign hypertension Good control of BP on Losartan 25 mg qd, Labetalol 200 mg BID, Amlodipine 5 mg qd and Lasix 40 mg qd. Recheck labs to monitor electrolytes, kidney function and hydration level.  6. Severe recurrent major depression without psychotic features ([place]) Depression treated with Celexa 10 mg qd, Pamelor 75 mg hs and Seroquel 100 mg hs. History of catatonic reaction in the past. Was given Hydroxyzine 25 mg qd prn anxiety at discharge from hospital.  7. Vitamin B12  deficiency B 12 low at 107 on 08-30-20. Discharged from the hospital on Vitamin B12 1000 mg       No follow-ups on file.         I, Lamira Borin, PA-C, have reviewed all documentation for this visit. The documentation on 11/14/20 for the exam, diagnosis, procedures, and orders are all accurate and complete.       Vernie Murders, PA-C   [employer]  [phone] (phone)  [phone] (fax)    [place]

## 2020-09-28 NOTE — Telephone Encounter (Signed)
OK for verbals 

## 2020-09-28 NOTE — Telephone Encounter (Signed)
Patricia advised. 

## 2020-09-29 ENCOUNTER — Other Ambulatory Visit: Payer: Self-pay | Admitting: Family Medicine

## 2020-09-29 ENCOUNTER — Telehealth: Payer: Self-pay

## 2020-09-29 ENCOUNTER — Telehealth: Payer: Self-pay | Admitting: Family Medicine

## 2020-09-29 DIAGNOSIS — J45901 Unspecified asthma with (acute) exacerbation: Secondary | ICD-10-CM | POA: Diagnosis not present

## 2020-09-29 DIAGNOSIS — E039 Hypothyroidism, unspecified: Secondary | ICD-10-CM | POA: Diagnosis not present

## 2020-09-29 DIAGNOSIS — I509 Heart failure, unspecified: Secondary | ICD-10-CM | POA: Diagnosis not present

## 2020-09-29 DIAGNOSIS — N1831 Chronic kidney disease, stage 3a: Secondary | ICD-10-CM | POA: Diagnosis not present

## 2020-09-29 NOTE — Telephone Encounter (Signed)
LM for Cindy to call back.

## 2020-09-29 NOTE — Telephone Encounter (Signed)
OK for verbals 

## 2020-09-29 NOTE — Telephone Encounter (Signed)
Copied from [place] [id number]. Topic: General - Other >> Sep 29, 2020  9:44 AM Tessa Lerner A wrote: Reason for CRM: Jenny Reichmann, PT with [facility] called to request to reschedule patient's PT evaluation for the week of 10/02/20-10/06/20  Please contact to advise

## 2020-09-29 NOTE — Telephone Encounter (Signed)
Verbal orders were given. KW

## 2020-09-29 NOTE — Telephone Encounter (Signed)
Home Health Verbal Orders - Caller/Agency: [place] Number: [phone] Requesting: OT Frequency: 1W1, 2W3, 1W1  *Nurse states pt was a little light headed and could barley stand on 09/29/19

## 2020-09-30 LAB — CBC WITH DIFFERENTIAL/PLATELET
Basophils Absolute: 0 10*3/uL (ref 0.0–0.2)
Basos: 0 %
EOS (ABSOLUTE): 0.1 10*3/uL (ref 0.0–0.4)
Eos: 1 %
Hematocrit: 31.5 % — ABNORMAL LOW (ref 34.0–46.6)
Hemoglobin: 10.4 g/dL — ABNORMAL LOW (ref 11.1–15.9)
Immature Grans (Abs): 0 10*3/uL (ref 0.0–0.1)
Immature Granulocytes: 0 %
Lymphocytes Absolute: 3.3 10*3/uL — ABNORMAL HIGH (ref 0.7–3.1)
Lymphs: 33 %
MCH: 32.4 pg (ref 26.6–33.0)
MCHC: 33 g/dL (ref 31.5–35.7)
MCV: 98 fL — ABNORMAL HIGH (ref 79–97)
Monocytes Absolute: 0.8 10*3/uL (ref 0.1–0.9)
Monocytes: 8 %
Neutrophils Absolute: 5.7 10*3/uL (ref 1.4–7.0)
Neutrophils: 58 %
Platelets: 312 10*3/uL (ref 150–450)
RBC: 3.21 x10E6/uL — ABNORMAL LOW (ref 3.77–5.28)
RDW: 12.2 % (ref 11.7–15.4)
WBC: 10 10*3/uL (ref 3.4–10.8)

## 2020-09-30 LAB — COMPREHENSIVE METABOLIC PANEL
ALT: 9 IU/L (ref 0–32)
AST: 11 IU/L (ref 0–40)
Albumin/Globulin Ratio: 1.3 (ref 1.2–2.2)
Albumin: 3.8 g/dL (ref 3.7–4.7)
Alkaline Phosphatase: 118 IU/L (ref 44–121)
BUN/Creatinine Ratio: 20 (ref 12–28)
BUN: 24 mg/dL (ref 8–27)
Bilirubin Total: 0.3 mg/dL (ref 0.0–1.2)
CO2: 31 mmol/L — ABNORMAL HIGH (ref 20–29)
Calcium: 9.3 mg/dL (ref 8.7–10.3)
Chloride: 96 mmol/L (ref 96–106)
Creatinine, Ser: 1.21 mg/dL — ABNORMAL HIGH (ref 0.57–1.00)
Globulin, Total: 2.9 g/dL (ref 1.5–4.5)
Glucose: 87 mg/dL (ref 65–99)
Potassium: 3.2 mmol/L — ABNORMAL LOW (ref 3.5–5.2)
Sodium: 140 mmol/L (ref 134–144)
Total Protein: 6.7 g/dL (ref 6.0–8.5)
eGFR: 47 mL/min/{1.73_m2} — ABNORMAL LOW (ref >59)

## 2020-09-30 LAB — TSH: TSH: 1.69 u[IU]/mL (ref 0.450–4.500)

## 2020-10-02 ENCOUNTER — Emergency Department
Admission: EM | Admit: 2020-10-02 | Discharge: 2020-10-02 | Disposition: A | Discharge status: Home / Self Care | Payer: Medicare Other | Attending: Emergency Medicine | Admitting: Emergency Medicine

## 2020-10-02 ENCOUNTER — Other Ambulatory Visit: Payer: Self-pay

## 2020-10-02 ENCOUNTER — Telehealth: Payer: Self-pay | Admitting: Family Medicine

## 2020-10-02 ENCOUNTER — Telehealth: Payer: Self-pay

## 2020-10-02 ENCOUNTER — Encounter: Payer: Self-pay | Admitting: Emergency Medicine

## 2020-10-02 DIAGNOSIS — I10 Essential (primary) hypertension: Secondary | ICD-10-CM | POA: Diagnosis not present

## 2020-10-02 DIAGNOSIS — Z7952 Long term (current) use of systemic steroids: Secondary | ICD-10-CM | POA: Diagnosis not present

## 2020-10-02 DIAGNOSIS — N1831 Chronic kidney disease, stage 3a: Secondary | ICD-10-CM | POA: Insufficient documentation

## 2020-10-02 DIAGNOSIS — N189 Chronic kidney disease, unspecified: Secondary | ICD-10-CM | POA: Diagnosis not present

## 2020-10-02 DIAGNOSIS — I129 Hypertensive chronic kidney disease with stage 1 through stage 4 chronic kidney disease, or unspecified chronic kidney disease: Secondary | ICD-10-CM | POA: Insufficient documentation

## 2020-10-02 DIAGNOSIS — E039 Hypothyroidism, unspecified: Secondary | ICD-10-CM | POA: Insufficient documentation

## 2020-10-02 DIAGNOSIS — I959 Hypotension, unspecified: Secondary | ICD-10-CM | POA: Diagnosis not present

## 2020-10-02 DIAGNOSIS — Z79899 Other long term (current) drug therapy: Secondary | ICD-10-CM | POA: Insufficient documentation

## 2020-10-02 DIAGNOSIS — J45909 Unspecified asthma, uncomplicated: Secondary | ICD-10-CM | POA: Diagnosis not present

## 2020-10-02 DIAGNOSIS — R531 Weakness: Secondary | ICD-10-CM

## 2020-10-02 DIAGNOSIS — Z7982 Long term (current) use of aspirin: Secondary | ICD-10-CM | POA: Insufficient documentation

## 2020-10-02 DIAGNOSIS — R0902 Hypoxemia: Secondary | ICD-10-CM | POA: Diagnosis not present

## 2020-10-02 DIAGNOSIS — R42 Dizziness and giddiness: Secondary | ICD-10-CM | POA: Diagnosis not present

## 2020-10-02 LAB — CBC
HCT: 32.5 % — ABNORMAL LOW (ref 36.0–46.0)
Hemoglobin: 10.5 g/dL — ABNORMAL LOW (ref 12.0–15.0)
MCH: 32.8 pg (ref 26.0–34.0)
MCHC: 32.3 g/dL (ref 30.0–36.0)
MCV: 101.6 fL — ABNORMAL HIGH (ref 80.0–100.0)
Platelets: 235 10*3/uL (ref 150–400)
RBC: 3.2 MIL/uL — ABNORMAL LOW (ref 3.87–5.11)
RDW: 12.8 % (ref 11.5–15.5)
WBC: 7.8 10*3/uL (ref 4.0–10.5)
nRBC: 0 % (ref 0.0–0.2)

## 2020-10-02 LAB — BASIC METABOLIC PANEL
Anion gap: 5 (ref 5–15)
BUN: 19 mg/dL (ref 8–23)
CO2: 32 mmol/L (ref 22–32)
Calcium: 8.6 mg/dL — ABNORMAL LOW (ref 8.9–10.3)
Chloride: 100 mmol/L (ref 98–111)
Creatinine, Ser: 1.31 mg/dL — ABNORMAL HIGH (ref 0.44–1.00)
GFR, Estimated: 43 mL/min — ABNORMAL LOW (ref >60)
Glucose, Bld: 137 mg/dL — ABNORMAL HIGH (ref 70–99)
Potassium: 3.5 mmol/L (ref 3.5–5.1)
Sodium: 137 mmol/L (ref 135–145)

## 2020-10-02 MED ORDER — SODIUM CHLORIDE 0.9 % IV BOLUS
1000.0000 mL | Freq: Once | INTRAVENOUS | Status: AC
  Administered 2020-10-02: 1000 mL via INTRAVENOUS

## 2020-10-02 NOTE — Telephone Encounter (Signed)
Jenny Reichmann with [place] called in to speak to [facility] returning call from Friday 09/29/20 please call Cindy at Ph# [phone]

## 2020-10-02 NOTE — Telephone Encounter (Signed)
Spoke with son on phone who inquired about patient getting B12 injections. After reviewing chart I see at her visit to the ED 08/30/20 Vitamin B12 drawn was low, on 09/04/20 ED doctor prescribed B12 po qd. Patient son states that patient never received prescription, patients son said he saw you with patient on 09/28/20 and had stated that we would put in order for B12, I am unable to view note from that date because it locks computer. Okay to reorder B12 as prescription pill or would you like patient to come to office for monthly injections? Please review labs and advise. KW

## 2020-10-02 NOTE — Telephone Encounter (Signed)
Copied from [place] [phone] [phone]. Topic: General - Other >> Sep 29, 2020  9:44 AM Tessa Lerner A wrote: Reason for CRM: Jenny Reichmann, PT with [facility] called to request to reschedule patient's PT evaluation for the week of 10/02/20-10/06/20  Please contact to advise

## 2020-10-02 NOTE — Telephone Encounter (Signed)
May schedule for monthly B12 1000 mg IM injection her or set up for home nurse injection if available.

## 2020-10-02 NOTE — ED Provider Notes (Addendum)
[facility] Emergency Department Provider Note   ____________________________________________   Event Date/Time   First MD Initiated Contact with Patient 10/02/20 1528     (approximate)  I have reviewed the triage vital signs and the nursing notes.   HISTORY  Chief Complaint Hypotension    HPI Wanda Ochoa is a 75 y.o. female who presents from home for hypotension  LOCATION: Generalized DURATION: 4 hours TIMING: Improved since onset SEVERITY: Moderate QUALITY: Hypotension CONTEXT: Home health nurse noted patient became dizzy walking to the bathroom with EMS found patient to have initial blood pressure of 73/40 and a pulse of 53 MODIFYING FACTORS: EMS administered 250 mL NS with improvement of blood pressure to 116/43 ASSOCIATED SYMPTOMS: Dizziness/lightheadedness   Per medical record review reveals polypharmacy          Past Medical History:  Diagnosis Date   Anxiety    Arthritis    right knee   Asthma    Depression    catatonic depression per pt report   HTN (hypertension)    Thyroid disease     Patient Active Problem List   Diagnosis Date Noted   Acute asthma exacerbation 09/23/2020   Acute congestive heart failure ([place])    Vitamin B12 deficiency 08/31/2020   Frequent falls 08/30/2020   Weakness 02/40/9735   Complicated grieving 32/99/2426   Drug reaction, subsequent encounter 09/23/2019   Rash and nonspecific skin eruption 09/23/2019   Stable angina pectoris ([place]) 09/10/2019   Stage 3a chronic kidney disease ([place]) 09/10/2019   Idiopathic gout involving toe of right foot 12/07/2018   Hematuria 09/11/2016   Venous insufficiency of both lower extremities 09/04/2016   Severe recurrent major depression without psychotic features ([place])    Hypercholesteremia 05/02/2015   Mitral valve prolapse 02/21/2015   Allergic rhinitis 12/24/2014   Osteopenia 12/24/2014   Hypothyroidism 11/24/2014   Generalized anxiety disorder  02/24/2012   Nocturnal hypoxemia 01/20/2012   Abdominal mass 01/20/2012   Shortness of breath 01/20/2012   Temporary cerebral vascular dysfunction 01/02/2009   Benign hypertension 03/28/2008   Asthma, exogenous 10/22/2007   Adaptive colitis 03/12/2003    Past Surgical History:  Procedure Laterality Date   ABDOMINAL HYSTERECTOMY  1990   BLADDER SUSPENSION  1990   BREAST CYST ASPIRATION Right 1985   BREAST EXCISIONAL BIOPSY Right 1967   EXCISIONAL BX - NEG   BREAST SURGERY Right 1967   Lumpectomy   CESAREAN SECTION     COLONOSCOPY WITH PROPOFOL N/A 12/18/2018   Procedure: COLONOSCOPY WITH PROPOFOL;  Surgeon: Lollie Sails, MD;  Location: [facility] ENDOSCOPY;  Service: Endoscopy;  Laterality: N/A;   [place]    Prior to Admission medications   Medication Sig Start Date End Date Taking? Authorizing Provider  albuterol (VENTOLIN HFA) 108 (90 Base) MCG/ACT inhaler Inhale 2 puffs into the lungs every 4 (four) hours as needed for wheezing or shortness of breath. INHALE 2 PUFFS INTO THE LUNGS EVERY 6 HOURS AS NEEDED 09/26/20   Elodia Florence., MD  amLODipine (NORVASC) 5 MG tablet Take 1 tablet (5 mg total) by mouth daily. 09/26/20 10/26/20  Elodia Florence., MD  aspirin 81 MG tablet Take 81 mg by mouth daily.    [provider]  cholecalciferol (VITAMIN D) 25 MCG tablet Take 1 tablet (1,000 Units total) by mouth daily. 09/26/20 10/26/20  Elodia Florence., MD  citalopram (CELEXA) 10 MG tablet Take 10 mg by mouth daily.  08/08/19   [provider]  fluticasone-salmeterol (ADVAIR HFA) 115-21 MCG/ACT inhaler Inhale 2 puffs into the lungs 2 (two) times daily. 09/26/20   Elodia Florence., MD  furosemide (LASIX) 40 MG tablet Take 1 tablet (40 mg total) by mouth daily. 09/26/20 10/26/20  Elodia Florence., MD  hydrOXYzine (VISTARIL) 25 MG capsule Take 25 mg by mouth daily as needed for anxiety. 07/03/20   [provider]   labetalol (NORMODYNE) 200 MG tablet Take 1 tablet (200 mg total) by mouth 2 (two) times daily. 09/04/20   Jennye Boroughs, MD  levothyroxine (SYNTHROID) 150 MCG tablet Take 1 tablet (150 mcg total) by mouth daily. 09/04/20   Jennye Boroughs, MD  losartan (COZAAR) 25 MG tablet Take 1 tablet (25 mg total) by mouth daily. 09/26/20 10/26/20  Elodia Florence., MD  nortriptyline (PAMELOR) 25 MG capsule Take 3 capsule at bed time Patient taking differently: Take 75 mg by mouth at bedtime. 09/04/20   Jennye Boroughs, MD  polyethylene glycol powder (GLYCOLAX/MIRALAX) 17 GM/SCOOP powder Take 17 g by mouth daily.    [provider]  potassium chloride (KLOR-CON 10) 10 MEQ tablet Take 2 tablets (20 mEq total) by mouth daily. 09/26/20 10/26/20  Elodia Florence., MD  pravastatin (PRAVACHOL) 20 MG tablet Take 20 mg by mouth daily.    [provider]  QUEtiapine (SEROQUEL) 100 MG tablet Take 100 mg by mouth at bedtime.    [provider]  vitamin B-12 (CYANOCOBALAMIN) 1000 MCG tablet Take 1 tablet (1,000 mcg total) by mouth daily. 09/30/20   Jennye Boroughs, MD    Allergies Penicillins and Lipitor [atorvastatin]  Family History  Problem Relation Age of Onset   Depression Father    Suicidality Father    Depression Sister    Breast cancer Sister 24   Autism Son    Heart disease Mother    Colon cancer Mother    Hypertension Mother    Anxiety disorder Mother    Mental illness Daughter    Allergies Brother    Allergies Sister    Asthma Maternal Uncle    Heart disease Maternal Grandfather    Heart disease Maternal Grandmother    Breast cancer Paternal Grandmother    Breast cancer Other     Social History Social History   Tobacco Use   Smoking status: Never   Smokeless tobacco: Never  Vaping Use   Vaping Use: Never used  Substance Use Topics   Alcohol use: No   Drug use: No    Review of Systems Constitutional: No fever/chills Eyes: No visual changes. ENT: No sore  throat. Cardiovascular: Denies chest pain. Respiratory: Denies shortness of breath. Gastrointestinal: No abdominal pain.  No nausea, no vomiting.  No diarrhea. Genitourinary: Negative for dysuria. Musculoskeletal: Negative for acute arthralgias Skin: Negative for rash. Neurological: Endorses resolved generalized weakness.  Negative for headaches, numbness/paresthesias in any extremity Psychiatric: Negative for suicidal ideation/homicidal ideation   ____________________________________________   PHYSICAL EXAM:  VITAL SIGNS: ED Triage Vitals  Enc Vitals Group     BP 10/02/20 1243 (!) 114/53     Pulse Rate 10/02/20 1243 (!) 56     Resp 10/02/20 1243 16     Temp 10/02/20 1243 98 F (36.7 C)     Temp Source 10/02/20 1243 Oral     SpO2 10/02/20 1243 97 %     Weight 10/02/20 1244 169 lb 8.5 oz (76.9 kg)     Height 10/02/20 1244  5\' 9"  (1.753 m)     Head Circumference --      [facility] Flow --      Pain Score 10/02/20 1244 0     Pain Loc --      Pain Edu? --      Excl. in [place]? --    Constitutional: Alert and oriented. Well appearing and in no acute distress. Eyes: Conjunctivae are normal. PERRL. Head: Atraumatic. Nose: No congestion/rhinnorhea. Mouth/Throat: Mucous membranes are moist. Neck: No stridor Cardiovascular: Grossly normal heart sounds.  Good peripheral circulation. Respiratory: Normal respiratory effort.  No retractions. Gastrointestinal: Soft and nontender. No distention. Musculoskeletal: No obvious deformities Neurologic:  Normal speech and language. No gross focal neurologic deficits are appreciated. Skin:  Skin is warm and dry. No rash noted. Psychiatric: Mood and affect are normal. Speech and behavior are normal.  ____________________________________________   LABS (all labs ordered are listed, but only abnormal results are displayed)  Labs Reviewed  BASIC METABOLIC PANEL - Abnormal; Notable for the following components:      Result Value   Glucose, Bld 137  (*)    Creatinine, Ser 1.31 (*)    Calcium 8.6 (*)    GFR, Estimated 43 (*)    All other components within normal limits  CBC - Abnormal; Notable for the following components:   RBC 3.20 (*)    Hemoglobin 10.5 (*)    HCT 32.5 (*)    MCV 101.6 (*)    All other components within normal limits  URINALYSIS, COMPLETE (UACMP) WITH MICROSCOPIC  CBG MONITORING, ED   ____________________________________________  EKG  ED ECG REPORT I, Naaman Plummer, the attending physician, personally viewed and interpreted this ECG.  Date: 10/02/2020 EKG Time: 1245 Rate: 59 Rhythm: normal sinus rhythm QRS Axis: normal Intervals: normal ST/T Wave abnormalities: normal Narrative Interpretation: no evidence of acute ischemia  PROCEDURES  Procedure(s) performed (including Critical Care):  .1-3 Lead EKG Interpretation  Date/Time: 10/02/2020 5:52 PM Performed by: Naaman Plummer, MD Authorized by: Naaman Plummer, MD     Interpretation: normal     ECG rate:  67   ECG rate assessment: normal     Rhythm: sinus rhythm     Ectopy: none     Conduction: normal     ____________________________________________   INITIAL IMPRESSION / ASSESSMENT AND PLAN / ED COURSE  As part of my medical decision making, I reviewed the following data within the electronic medical record, if available:  Nursing notes reviewed and incorporated, Labs reviewed, EKG interpreted, Old chart reviewed, Radiograph reviewed and Notes from prior ED visits reviewed and incorporated        This patient presents with hypotension, generalized weakness, lightheadedness, and fatigue likely secondary to dehydration and/or medication adherence/overdose.  No evidence of acute kidney injury Doubt intrinsic renal dysfunction or obstructive nephropathy. Considered alternate etiologies of the patients symptoms including infectious processes, severe metabolic derangements or electrolyte abnormalities, ischemia/ACS, heart failure, and  intracranial/central processes but think these are unlikely given the history and physical exam.  Plan: labs, 1L fluid resuscitation, pain/nausea control, reassessment  Dispo: Discharge home with PCP follow-up     ____________________________________________   FINAL CLINICAL IMPRESSION(S) / ED DIAGNOSES  Final diagnoses:  Weakness  Transient hypotension  Chronic kidney disease, unspecified CKD stage     ED Discharge Orders     None        Note:  This document was prepared using Dragon voice recognition software and may include unintentional dictation errors.  Naaman Plummer, MD 10/02/20 1752    Naaman Plummer, MD 10/02/20 [phone]

## 2020-10-02 NOTE — Telephone Encounter (Signed)
Routing to appropriate office

## 2020-10-02 NOTE — Discharge Instructions (Addendum)
Please discuss your medication regimen with your primary care doctor soon as possible

## 2020-10-02 NOTE — Telephone Encounter (Signed)
Pt son Wanda Ochoa is calling checking up on b12 order need to be call in today

## 2020-10-02 NOTE — Telephone Encounter (Signed)
See other telephone encounter from today. KW

## 2020-10-02 NOTE — ED Notes (Signed)
See triage note, pt reports hypotension today and feeling dizzy and weak. States this has happened before. Pt alert and oriented Call bell in reach

## 2020-10-02 NOTE — Telephone Encounter (Signed)
Pts husband is calling to request an update on an order that Simona Huh was going to place for a B-12 injection. Please advise [phone]

## 2020-10-02 NOTE — Telephone Encounter (Signed)
Wanda Ochoa called to let us know that [name]'s PT was moved from 09/29/20 to this week.

## 2020-10-02 NOTE — Telephone Encounter (Signed)
Copied from [place] [phone]. Topic: General - Other >> Sep 29, 2020  9:44 AM Tessa Lerner A wrote: Reason for CRM: Jenny Reichmann, PT with [facility] called to request to reschedule patient's PT evaluation for the week of 10/02/20-10/06/20  Please contact to advise

## 2020-10-02 NOTE — ED Triage Notes (Signed)
Arrives from home via [facility].  Per report, home health nurse noted that patient became dizzy walking to BR.  Lowered to floor.  EMS reports initial BP 73/40.  Pl 53  96% ra.  20 g Rfa and 250 NS infused.  CBG 162.  BP:  116/43 after fluid.

## 2020-10-02 NOTE — Telephone Encounter (Signed)
Copied from [place] [phone]. Topic: Appointment Scheduling - Scheduling Inquiry for Clinic >> Oct 02, 2020 10:57 AM Erick Blinks wrote: Reason for CRM: Pt's son Felix Pratt called reporting that the patient is still waiting for her B12 injection appts.   [phone]

## 2020-10-03 ENCOUNTER — Telehealth: Payer: Self-pay | Admitting: Family Medicine

## 2020-10-03 ENCOUNTER — Telehealth: Payer: Self-pay

## 2020-10-03 DIAGNOSIS — M1711 Unilateral primary osteoarthritis, right knee: Secondary | ICD-10-CM | POA: Diagnosis not present

## 2020-10-03 DIAGNOSIS — I5031 Acute diastolic (congestive) heart failure: Secondary | ICD-10-CM | POA: Diagnosis not present

## 2020-10-03 DIAGNOSIS — I700 Atherosclerosis of aorta: Secondary | ICD-10-CM | POA: Diagnosis not present

## 2020-10-03 DIAGNOSIS — J45901 Unspecified asthma with (acute) exacerbation: Secondary | ICD-10-CM | POA: Diagnosis not present

## 2020-10-03 DIAGNOSIS — J9601 Acute respiratory failure with hypoxia: Secondary | ICD-10-CM | POA: Diagnosis not present

## 2020-10-03 DIAGNOSIS — I110 Hypertensive heart disease with heart failure: Secondary | ICD-10-CM | POA: Diagnosis not present

## 2020-10-03 NOTE — Telephone Encounter (Signed)
Noted  

## 2020-10-03 NOTE — Telephone Encounter (Signed)
Copied from [place] [phone]. Topic: General - Other >> Oct 03, 2020 10:52 AM Valere Dross wrote: Reason for CRM: Izora Gala from [facility] called ins stating the pt has been having dome BP changes, BP was checked twice and when sitting it was 144/80 and then stating it was 110/68. Then the second time sitting it was 140/76 and standing it was 120/70. Izora Gala wanted to let PCP know what was going on. Please advise.

## 2020-10-03 NOTE — Telephone Encounter (Signed)
Izora Gala occupational therapist advised.

## 2020-10-03 NOTE — Telephone Encounter (Signed)
FYI. KW 

## 2020-10-03 NOTE — Telephone Encounter (Signed)
If continues, may need to decrease some BP meds. Make sure to stay hydrated.

## 2020-10-03 NOTE — Telephone Encounter (Signed)
Called son back and scheduled patient for B12 injection tomorrow morning. KW

## 2020-10-03 NOTE — Telephone Encounter (Signed)
Home Health Verbal Orders - Caller/Agency: [facility] Number: [phone]  1.) Requesting:PT  Frequency: 2w4 1w4   2.) Requesting: Social Worker Frequency: 1w1

## 2020-10-04 ENCOUNTER — Other Ambulatory Visit: Payer: Self-pay

## 2020-10-04 ENCOUNTER — Telehealth: Payer: Self-pay

## 2020-10-04 ENCOUNTER — Ambulatory Visit (INDEPENDENT_AMBULATORY_CARE_PROVIDER_SITE_OTHER): Payer: Medicare Other

## 2020-10-04 DIAGNOSIS — I110 Hypertensive heart disease with heart failure: Secondary | ICD-10-CM | POA: Diagnosis not present

## 2020-10-04 DIAGNOSIS — J45901 Unspecified asthma with (acute) exacerbation: Secondary | ICD-10-CM | POA: Diagnosis not present

## 2020-10-04 DIAGNOSIS — M1711 Unilateral primary osteoarthritis, right knee: Secondary | ICD-10-CM | POA: Diagnosis not present

## 2020-10-04 DIAGNOSIS — I700 Atherosclerosis of aorta: Secondary | ICD-10-CM | POA: Diagnosis not present

## 2020-10-04 DIAGNOSIS — E538 Deficiency of other specified B group vitamins: Secondary | ICD-10-CM

## 2020-10-04 DIAGNOSIS — I5031 Acute diastolic (congestive) heart failure: Secondary | ICD-10-CM | POA: Diagnosis not present

## 2020-10-04 DIAGNOSIS — J9601 Acute respiratory failure with hypoxia: Secondary | ICD-10-CM | POA: Diagnosis not present

## 2020-10-04 MED ORDER — CYANOCOBALAMIN 1000 MCG/ML IJ SOLN
1000.0000 ug | Freq: Once | INTRAMUSCULAR | Status: AC
  Administered 2020-10-04: 1000 ug via INTRAMUSCULAR

## 2020-10-04 MED ORDER — CYANOCOBALAMIN 1000 MCG/ML IJ SOLN: 1000.0000 ug | Freq: Once | INTRAMUSCULAR | 0 refills | Status: AC

## 2020-10-04 MED ORDER — "SYRINGE 25G X 1"" 3 ML MISC": 1.0000 | 0 refills | Status: DC

## 2020-10-04 NOTE — Telephone Encounter (Signed)
Tried returning Cindy's call. Left message to call back. OK for [facility] triage to give message.

## 2020-10-04 NOTE — Addendum Note (Signed)
Addended by: Althea Charon D on: 10/04/2020 10:26 AM   Modules accepted: Orders

## 2020-10-04 NOTE — Telephone Encounter (Signed)
Cindy PT with [facility] is calling checking on the verbal orders

## 2020-10-04 NOTE — Telephone Encounter (Signed)
Patient came in today for B12 injection.  She had been started on the while at [facility] but she has not gotten them consistently.  I explained the schedule to her of getting 1 weekly x 4 weeks and then monthly.  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  They wanted to continue them at home.  I have sent in the Rx.  She does not have f/u scheduled.  I told her we would find out when you wanted to see her and let her know.  Do you want to see her 1 month after the last of the weekly injections or some other time? Thanks

## 2020-10-05 ENCOUNTER — Ambulatory Visit: Payer: Medicare Other | Admitting: Cardiology

## 2020-10-05 DIAGNOSIS — J45901 Unspecified asthma with (acute) exacerbation: Secondary | ICD-10-CM | POA: Diagnosis not present

## 2020-10-05 DIAGNOSIS — I5031 Acute diastolic (congestive) heart failure: Secondary | ICD-10-CM | POA: Diagnosis not present

## 2020-10-05 DIAGNOSIS — I700 Atherosclerosis of aorta: Secondary | ICD-10-CM | POA: Diagnosis not present

## 2020-10-05 DIAGNOSIS — J9601 Acute respiratory failure with hypoxia: Secondary | ICD-10-CM | POA: Diagnosis not present

## 2020-10-05 DIAGNOSIS — M1711 Unilateral primary osteoarthritis, right knee: Secondary | ICD-10-CM | POA: Diagnosis not present

## 2020-10-05 DIAGNOSIS — I110 Hypertensive heart disease with heart failure: Secondary | ICD-10-CM | POA: Diagnosis not present

## 2020-10-05 NOTE — Telephone Encounter (Signed)
Wanda Ochoa, PT with [facility] called, left VM to return the call for orders.

## 2020-10-05 NOTE — Telephone Encounter (Signed)
Left message for Jenny Reichmann approving orders.

## 2020-10-06 ENCOUNTER — Encounter: Payer: Self-pay | Admitting: Cardiology

## 2020-10-06 ENCOUNTER — Telehealth: Payer: Self-pay

## 2020-10-06 DIAGNOSIS — I110 Hypertensive heart disease with heart failure: Secondary | ICD-10-CM | POA: Diagnosis not present

## 2020-10-06 DIAGNOSIS — J9601 Acute respiratory failure with hypoxia: Secondary | ICD-10-CM | POA: Diagnosis not present

## 2020-10-06 DIAGNOSIS — M1711 Unilateral primary osteoarthritis, right knee: Secondary | ICD-10-CM | POA: Diagnosis not present

## 2020-10-06 DIAGNOSIS — E538 Deficiency of other specified B group vitamins: Secondary | ICD-10-CM

## 2020-10-06 DIAGNOSIS — I5031 Acute diastolic (congestive) heart failure: Secondary | ICD-10-CM | POA: Diagnosis not present

## 2020-10-06 DIAGNOSIS — J45901 Unspecified asthma with (acute) exacerbation: Secondary | ICD-10-CM | POA: Diagnosis not present

## 2020-10-06 DIAGNOSIS — I700 Atherosclerosis of aorta: Secondary | ICD-10-CM | POA: Diagnosis not present

## 2020-10-06 MED ORDER — CYANOCOBALAMIN 1000 MCG/ML IJ SOLN: 1000.0000 ug | INTRAMUSCULAR | 6 refills | Status: AC

## 2020-10-06 NOTE — Telephone Encounter (Signed)
Copied from [place] [phone]. Topic: General - Other >> Oct 06, 2020 11:48 AM Tessa Lerner A wrote: Reason for CRM: Patient has spoken with their pharmacy and told that they still haven't received their prescription for B 12 needed for injections  Patient would like to speak with a member of staff about this and discuss further   Please contact when possible

## 2020-10-06 NOTE — Telephone Encounter (Signed)
Rx sent to pharmacy   

## 2020-10-06 NOTE — Telephone Encounter (Signed)
No just when she is next due for chronic follow-up. No need to see her for just B12 check.

## 2020-10-06 NOTE — Telephone Encounter (Signed)
May order B 12 1000 mg to be given IM once a week the next 3 weeks then once a month per Dr. Brita Romp.

## 2020-10-10 ENCOUNTER — Emergency Department: Payer: Medicare Other

## 2020-10-10 ENCOUNTER — Other Ambulatory Visit: Payer: Self-pay

## 2020-10-10 ENCOUNTER — Emergency Department
Admission: EM | Admit: 2020-10-10 | Discharge: 2020-10-10 | Disposition: A | Discharge status: Home / Self Care | Payer: Medicare Other | Attending: Emergency Medicine | Admitting: Emergency Medicine

## 2020-10-10 DIAGNOSIS — R42 Dizziness and giddiness: Secondary | ICD-10-CM | POA: Insufficient documentation

## 2020-10-10 DIAGNOSIS — Z7951 Long term (current) use of inhaled steroids: Secondary | ICD-10-CM | POA: Insufficient documentation

## 2020-10-10 DIAGNOSIS — I130 Hypertensive heart and chronic kidney disease with heart failure and stage 1 through stage 4 chronic kidney disease, or unspecified chronic kidney disease: Secondary | ICD-10-CM | POA: Diagnosis not present

## 2020-10-10 DIAGNOSIS — N1831 Chronic kidney disease, stage 3a: Secondary | ICD-10-CM | POA: Diagnosis not present

## 2020-10-10 DIAGNOSIS — I509 Heart failure, unspecified: Secondary | ICD-10-CM | POA: Diagnosis not present

## 2020-10-10 DIAGNOSIS — J45901 Unspecified asthma with (acute) exacerbation: Secondary | ICD-10-CM | POA: Diagnosis not present

## 2020-10-10 DIAGNOSIS — R0902 Hypoxemia: Secondary | ICD-10-CM | POA: Diagnosis not present

## 2020-10-10 DIAGNOSIS — E039 Hypothyroidism, unspecified: Secondary | ICD-10-CM | POA: Insufficient documentation

## 2020-10-10 DIAGNOSIS — I10 Essential (primary) hypertension: Secondary | ICD-10-CM | POA: Diagnosis not present

## 2020-10-10 DIAGNOSIS — Z79899 Other long term (current) drug therapy: Secondary | ICD-10-CM | POA: Insufficient documentation

## 2020-10-10 DIAGNOSIS — R002 Palpitations: Secondary | ICD-10-CM | POA: Diagnosis not present

## 2020-10-10 DIAGNOSIS — Z7982 Long term (current) use of aspirin: Secondary | ICD-10-CM | POA: Diagnosis not present

## 2020-10-10 DIAGNOSIS — I341 Nonrheumatic mitral (valve) prolapse: Secondary | ICD-10-CM | POA: Diagnosis not present

## 2020-10-10 DIAGNOSIS — I959 Hypotension, unspecified: Secondary | ICD-10-CM | POA: Diagnosis not present

## 2020-10-10 DIAGNOSIS — I38 Endocarditis, valve unspecified: Secondary | ICD-10-CM | POA: Diagnosis not present

## 2020-10-10 DIAGNOSIS — R0602 Shortness of breath: Secondary | ICD-10-CM | POA: Diagnosis not present

## 2020-10-10 DIAGNOSIS — R2681 Unsteadiness on feet: Secondary | ICD-10-CM | POA: Diagnosis not present

## 2020-10-10 DIAGNOSIS — E782 Mixed hyperlipidemia: Secondary | ICD-10-CM | POA: Diagnosis not present

## 2020-10-10 LAB — CBC
HCT: 31.5 % — ABNORMAL LOW (ref 36.0–46.0)
Hemoglobin: 10.4 g/dL — ABNORMAL LOW (ref 12.0–15.0)
MCH: 32.4 pg (ref 26.0–34.0)
MCHC: 33 g/dL (ref 30.0–36.0)
MCV: 98.1 fL (ref 80.0–100.0)
Platelets: 210 10*3/uL (ref 150–400)
RBC: 3.21 MIL/uL — ABNORMAL LOW (ref 3.87–5.11)
RDW: 12.5 % (ref 11.5–15.5)
WBC: 7.1 10*3/uL (ref 4.0–10.5)
nRBC: 0 % (ref 0.0–0.2)

## 2020-10-10 LAB — TSH: TSH: 8.265 u[IU]/mL — ABNORMAL HIGH (ref 0.350–4.500)

## 2020-10-10 LAB — BASIC METABOLIC PANEL
Anion gap: 7 (ref 5–15)
BUN: 21 mg/dL (ref 8–23)
CO2: 30 mmol/L (ref 22–32)
Calcium: 8.6 mg/dL — ABNORMAL LOW (ref 8.9–10.3)
Chloride: 97 mmol/L — ABNORMAL LOW (ref 98–111)
Creatinine, Ser: 1.66 mg/dL — ABNORMAL HIGH (ref 0.44–1.00)
GFR, Estimated: 32 mL/min — ABNORMAL LOW (ref >60)
Glucose, Bld: 108 mg/dL — ABNORMAL HIGH (ref 70–99)
Potassium: 4 mmol/L (ref 3.5–5.1)
Sodium: 134 mmol/L — ABNORMAL LOW (ref 135–145)

## 2020-10-10 LAB — HEPATIC FUNCTION PANEL
ALT: 12 U/L (ref 0–44)
AST: 28 U/L (ref 15–41)
Albumin: 3.7 g/dL (ref 3.5–5.0)
Alkaline Phosphatase: 79 U/L (ref 38–126)
Bilirubin, Direct: 0.3 mg/dL — ABNORMAL HIGH (ref 0.0–0.2)
Indirect Bilirubin: 0.6 mg/dL (ref 0.3–0.9)
Total Bilirubin: 0.9 mg/dL (ref 0.3–1.2)
Total Protein: 6.9 g/dL (ref 6.5–8.1)

## 2020-10-10 MED ORDER — SODIUM CHLORIDE 0.9 % IV BOLUS
500.0000 mL | Freq: Once | INTRAVENOUS | Status: AC
  Administered 2020-10-10: 500 mL via INTRAVENOUS

## 2020-10-10 NOTE — ED Provider Notes (Signed)
[name] MRI without any acute abnormality. [name] TSH was elevated. She does have history of hypothyroid and is on synthroid. Discussed results with patient. She was able to ambulate in the room albeit with some difficulty. However she states she does have and use a walker at her house. Discussed disposition with the patient. At this time she would like to try going home. Did advise patient to get in touch with PCP to see if they would want to adjust her synthroid. Additionally it sounds like the patient has been referred to neurology.    Nance Pear, MD 10/11/20 [phone]

## 2020-10-10 NOTE — ED Provider Notes (Signed)
[facility] Emergency Department Provider Note  ____________________________________________   None    (approximate)  I have reviewed the triage vital signs and the nursing notes.   HISTORY  Chief Complaint Dizziness    HPI Wanda Ochoa is a 75 y.o. female presents emergency department complaining of dizziness and low blood pressure.  Seen for the same approximately 2 weeks ago.  Patient states it got so bad today she had to sit on the bathroom floor and then was unable to get up.  States since then her arms and legs have begun to shake.  Denies chest pain or shortness of breath.  She denies headache.  Past Medical History:  Diagnosis Date   Anxiety    Arthritis    right knee   Asthma    Depression    catatonic depression per pt report   HTN (hypertension)    Thyroid disease     Patient Active Problem List   Diagnosis Date Noted   Acute asthma exacerbation 09/23/2020   Acute congestive heart failure ([place])    Vitamin B12 deficiency 08/31/2020   Frequent falls 08/30/2020   Weakness 96/28/3662   Complicated grieving 94/76/5465   Drug reaction, subsequent encounter 09/23/2019   Rash and nonspecific skin eruption 09/23/2019   Stable angina pectoris ([place]) 09/10/2019   Stage 3a chronic kidney disease ([place]) 09/10/2019   Idiopathic gout involving toe of right foot 12/07/2018   Hematuria 09/11/2016   Venous insufficiency of both lower extremities 09/04/2016   Severe recurrent major depression without psychotic features ([place])    Hypercholesteremia 05/02/2015   Mitral valve prolapse 02/21/2015   Allergic rhinitis 12/24/2014   Osteopenia 12/24/2014   Hypothyroidism 11/24/2014   Generalized anxiety disorder 02/24/2012   Nocturnal hypoxemia 01/20/2012   Abdominal mass 01/20/2012   Shortness of breath 01/20/2012   Temporary cerebral vascular dysfunction 01/02/2009   Benign hypertension 03/28/2008   Asthma, exogenous 10/22/2007   Adaptive  colitis 03/12/2003    Past Surgical History:  Procedure Laterality Date   ABDOMINAL HYSTERECTOMY  1990   BLADDER SUSPENSION  1990   BREAST CYST ASPIRATION Right 1985   BREAST EXCISIONAL BIOPSY Right 1967   EXCISIONAL BX - NEG   BREAST SURGERY Right 1967   Lumpectomy   CESAREAN SECTION     COLONOSCOPY WITH PROPOFOL N/A 12/18/2018   Procedure: COLONOSCOPY WITH PROPOFOL;  Surgeon: Lollie Sails, MD;  Location: [facility] ENDOSCOPY;  Service: Endoscopy;  Laterality: N/A;   [place]    Prior to Admission medications   Medication Sig Start Date End Date Taking? Authorizing Provider  albuterol (VENTOLIN HFA) 108 (90 Base) MCG/ACT inhaler Inhale 2 puffs into the lungs every 4 (four) hours as needed for wheezing or shortness of breath. INHALE 2 PUFFS INTO THE LUNGS EVERY 6 HOURS AS NEEDED 09/26/20   Elodia Florence., MD  amLODipine (NORVASC) 5 MG tablet Take 1 tablet (5 mg total) by mouth daily. 09/26/20 10/26/20  Elodia Florence., MD  aspirin 81 MG tablet Take 81 mg by mouth daily.    [provider]  cholecalciferol (VITAMIN D) 25 MCG tablet Take 1 tablet (1,000 Units total) by mouth daily. 09/26/20 10/26/20  Elodia Florence., MD  citalopram (CELEXA) 10 MG tablet Take 10 mg by mouth daily. 08/08/19   [provider]  cyanocobalamin (,VITAMIN B-12,) 1000 MCG/ML injection Inject 1 mL (1,000 mcg total) into the muscle once a week. Once a week  for 3 weeks and Than Once Monthly 10/06/20   Chrismon, Vickki Muff, PA-C  fluticasone-salmeterol (ADVAIR HFA) 115-21 MCG/ACT inhaler Inhale 2 puffs into the lungs 2 (two) times daily. 09/26/20   Elodia Florence., MD  furosemide (LASIX) 40 MG tablet Take 1 tablet (40 mg total) by mouth daily. 09/26/20 10/26/20  Elodia Florence., MD  hydrOXYzine (VISTARIL) 25 MG capsule Take 25 mg by mouth daily as needed for anxiety. 07/03/20   [provider]  labetalol (NORMODYNE) 200 MG tablet Take 1  tablet (200 mg total) by mouth 2 (two) times daily. 09/04/20   Jennye Boroughs, MD  levothyroxine (SYNTHROID) 150 MCG tablet Take 1 tablet (150 mcg total) by mouth daily. 09/04/20   Jennye Boroughs, MD  losartan (COZAAR) 25 MG tablet Take 1 tablet (25 mg total) by mouth daily. 09/26/20 10/26/20  Elodia Florence., MD  nortriptyline (PAMELOR) 25 MG capsule Take 3 capsule at bed time Patient taking differently: Take 75 mg by mouth at bedtime. 09/04/20   Jennye Boroughs, MD  polyethylene glycol powder (GLYCOLAX/MIRALAX) 17 GM/SCOOP powder Take 17 g by mouth daily.    [provider]  potassium chloride (KLOR-CON 10) 10 MEQ tablet Take 2 tablets (20 mEq total) by mouth daily. 09/26/20 10/26/20  Elodia Florence., MD  pravastatin (PRAVACHOL) 20 MG tablet Take 20 mg by mouth daily.    [provider]  QUEtiapine (SEROQUEL) 100 MG tablet Take 100 mg by mouth at bedtime.    [provider]  Syringe/Needle, Disp, (SYRINGE 3CC/25GX1") 25G X 1" 3 ML MISC 1 each by Does not apply route every 30 (thirty) days. 10/04/20   Virginia Crews, MD  vitamin B-12 (CYANOCOBALAMIN) 1000 MCG tablet Take 1 tablet (1,000 mcg total) by mouth daily. 09/30/20   Jennye Boroughs, MD    Allergies Penicillins and Lipitor [atorvastatin]  Family History  Problem Relation Age of Onset   Depression Father    Suicidality Father    Depression Sister    Breast cancer Sister 18   Autism Son    Heart disease Mother    Colon cancer Mother    Hypertension Mother    Anxiety disorder Mother    Mental illness Daughter    Allergies Brother    Allergies Sister    Asthma Maternal Uncle    Heart disease Maternal Grandfather    Heart disease Maternal Grandmother    Breast cancer Paternal Grandmother    Breast cancer Other     Social History Social History   Tobacco Use   Smoking status: Never   Smokeless tobacco: Never  Vaping Use   Vaping Use: Never used  Substance Use Topics   Alcohol use: No    Drug use: No    Review of Systems  Constitutional: No fever/chills Eyes: No visual changes. ENT: No sore throat. Respiratory: Denies cough Cardiovascular: Denies chest pain Gastrointestinal: Denies abdominal pain Genitourinary: Negative for dysuria. Musculoskeletal: Negative for back pain. Skin: Negative for rash. Psychiatric: no mood changes,     ____________________________________________   PHYSICAL EXAM:  VITAL SIGNS: ED Triage Vitals [10/10/20 1628]  Enc Vitals Group     BP 136/64     Pulse Rate 69     Resp 18     Temp 98.4 F (36.9 C)     Temp Source Oral     SpO2 96 %     Weight 165 lb (74.8 kg)     Height 5\' 9"  (1.753  m)     Head Circumference      Peak Flow      Pain Score 0     Pain Loc      Pain Edu?      Excl. in [place]?     Constitutional: Alert and oriented. Well appearing and in no acute distress.  Patient is very weak and trembling upon standing Eyes: Conjunctivae are normal.  PERRL, 2 beats nystagmus bilaterally Head: Atraumatic. Nose: No congestion/rhinnorhea. Mouth/Throat: Mucous membranes are moist.   Neck:  supple no lymphadenopathy noted Cardiovascular: Normal rate, regular rhythm. Heart sounds are normal Respiratory: Normal respiratory effort.  No retractions, lungs c t a  Abd: soft nontender bs normal all 4 quad GU: deferred Musculoskeletal: FROM all extremities, warm and well perfused Neurologic:  Normal speech and language.  Cranial nerves II through XII grossly intact Skin:  Skin is warm, dry and intact. No rash noted. Psychiatric: Mood and affect are normal. Speech and behavior are normal.  ____________________________________________   LABS (all labs ordered are listed, but only abnormal results are displayed)  Labs Reviewed  BASIC METABOLIC PANEL - Abnormal; Notable for the following components:      Result Value   Sodium 134 (*)    Chloride 97 (*)    Glucose, Bld 108 (*)    Creatinine, Ser 1.66 (*)    Calcium 8.6 (*)     GFR, Estimated 32 (*)    All other components within normal limits  CBC - Abnormal; Notable for the following components:   RBC 3.21 (*)    Hemoglobin 10.4 (*)    HCT 31.5 (*)    All other components within normal limits  HEPATIC FUNCTION PANEL - Abnormal; Notable for the following components:   Bilirubin, Direct 0.3 (*)    All other components within normal limits  TSH - Abnormal; Notable for the following components:   TSH 8.265 (*)    All other components within normal limits  URINALYSIS, COMPLETE (UACMP) WITH MICROSCOPIC  T4   ____________________________________________   ____________________________________________  RADIOLOGY  CT of the head  ____________________________________________   PROCEDURES  Procedure(s) performed: No  Procedures    ____________________________________________   INITIAL IMPRESSION / ASSESSMENT AND PLAN / ED COURSE  Pertinent labs & imaging results that were available during my care of the patient were reviewed by me and considered in my medical decision making (see chart for details).   The patient is a 75 year old female presents with dizziness.  See HPI.  Physical exam shows patient appears stable  DDx: Vertigo, hypotension, dehydration, hypokalemia, CVA, SAH, seizure  CBC remains in its normal trend with a decreased H&H, basic metabolic panel has decreased sodium of 134, BUN is normal, creatinine is elevated, consider dehydration  Tsh, Hepatic panel and urinalysis pending  Hepatic function panel is normal, TSH is elevated 8.265 with concerns of hypothyroidism, T4 added  Patient CT of the head was reviewed by me confirmed by radiology to be negative for any acute intracranial abnormality.  Discussed with Dr. Archie Balboa.  Will order MRI of the brain Care transferred to Dr. Gennette Pac 8 PM.     Wanda Ochoa was evaluated in Emergency Department on 10/10/2020 for the symptoms described in the history of present illness. She  was evaluated in the context of the global COVID-19 pandemic, which necessitated consideration that the patient might be at risk for infection with the SARS-CoV-2 virus that causes COVID-19. Institutional protocols and algorithms that pertain to the evaluation  of patients at risk for COVID-19 are in a state of rapid change based on information released by regulatory bodies including the CDC and federal and state organizations. These policies and algorithms were followed during the patient's care in the ED.    As part of my medical decision making, I reviewed the following data within the [place] notes reviewed and incorporated, Labs reviewed , EKG interpreted NSR, Old chart reviewed, Radiograph reviewed, Evaluated by EM attending , Notes from prior ED visits, and [place] Controlled Substance Database  ____________________________________________   FINAL CLINICAL IMPRESSION(S) / ED DIAGNOSES  Final diagnoses:  Dizziness  Hypothyroidism, unspecified type      NEW MEDICATIONS STARTED DURING THIS VISIT:  New Prescriptions   No medications on file     Note:  This document was prepared using Dragon voice recognition software and may include unintentional dictation errors.    Versie Starks, PA-C 10/10/20 1952    Nance Pear, MD 10/10/20 2016

## 2020-10-10 NOTE — ED Triage Notes (Addendum)
Pt to ED [facility] from home for dizziness, hypotension, seen for same in ED a few weeks ago.  250 NS given PTA 20G left wrist.  Pt alert and oriented, NAD noted  States has not seen PCP to get bp meds changed as advised at last ED visit

## 2020-10-10 NOTE — ED Notes (Signed)
ED Provider at bedside. 

## 2020-10-10 NOTE — ED Notes (Signed)
Pt transported to CT via stretcher at this time.  

## 2020-10-10 NOTE — Discharge Instructions (Addendum)
Please seek medical attention for any high fevers, chest pain, shortness of breath, change in behavior, persistent vomiting, bloody stool or any other new or concerning symptoms.  

## 2020-10-10 NOTE — ED Notes (Signed)
Report received from [place], [place]

## 2020-10-11 ENCOUNTER — Telehealth: Payer: Self-pay

## 2020-10-11 DIAGNOSIS — J45901 Unspecified asthma with (acute) exacerbation: Secondary | ICD-10-CM | POA: Diagnosis not present

## 2020-10-11 DIAGNOSIS — I5031 Acute diastolic (congestive) heart failure: Secondary | ICD-10-CM | POA: Diagnosis not present

## 2020-10-11 DIAGNOSIS — J9601 Acute respiratory failure with hypoxia: Secondary | ICD-10-CM | POA: Diagnosis not present

## 2020-10-11 DIAGNOSIS — I110 Hypertensive heart disease with heart failure: Secondary | ICD-10-CM | POA: Diagnosis not present

## 2020-10-11 DIAGNOSIS — I700 Atherosclerosis of aorta: Secondary | ICD-10-CM | POA: Diagnosis not present

## 2020-10-11 DIAGNOSIS — M1711 Unilateral primary osteoarthritis, right knee: Secondary | ICD-10-CM | POA: Diagnosis not present

## 2020-10-11 NOTE — Telephone Encounter (Signed)
Copied from [place] [phone]. Topic: General - Other >> Oct 11, 2020  1:35 PM Pawlus, Brayton Layman A wrote: Reason for CRM: Sandi Carne was calling to follow up on faxes they sent over for the pt, caller stated she needed the [facility] paperwork faxed back to [phone]

## 2020-10-12 DIAGNOSIS — I700 Atherosclerosis of aorta: Secondary | ICD-10-CM | POA: Diagnosis not present

## 2020-10-12 DIAGNOSIS — M1711 Unilateral primary osteoarthritis, right knee: Secondary | ICD-10-CM | POA: Diagnosis not present

## 2020-10-12 DIAGNOSIS — Z111 Encounter for screening for respiratory tuberculosis: Secondary | ICD-10-CM | POA: Diagnosis not present

## 2020-10-12 DIAGNOSIS — J45901 Unspecified asthma with (acute) exacerbation: Secondary | ICD-10-CM | POA: Diagnosis not present

## 2020-10-12 DIAGNOSIS — I110 Hypertensive heart disease with heart failure: Secondary | ICD-10-CM | POA: Diagnosis not present

## 2020-10-12 DIAGNOSIS — J9601 Acute respiratory failure with hypoxia: Secondary | ICD-10-CM | POA: Diagnosis not present

## 2020-10-12 DIAGNOSIS — I5031 Acute diastolic (congestive) heart failure: Secondary | ICD-10-CM | POA: Diagnosis not present

## 2020-10-12 NOTE — Telephone Encounter (Signed)
I gave this back to nurses to see if CCM could assist. I do not have the paperwork currently

## 2020-10-13 DIAGNOSIS — I110 Hypertensive heart disease with heart failure: Secondary | ICD-10-CM | POA: Diagnosis not present

## 2020-10-13 DIAGNOSIS — M1711 Unilateral primary osteoarthritis, right knee: Secondary | ICD-10-CM | POA: Diagnosis not present

## 2020-10-13 DIAGNOSIS — I700 Atherosclerosis of aorta: Secondary | ICD-10-CM | POA: Diagnosis not present

## 2020-10-13 DIAGNOSIS — J45901 Unspecified asthma with (acute) exacerbation: Secondary | ICD-10-CM | POA: Diagnosis not present

## 2020-10-13 DIAGNOSIS — J9601 Acute respiratory failure with hypoxia: Secondary | ICD-10-CM | POA: Diagnosis not present

## 2020-10-13 DIAGNOSIS — I5031 Acute diastolic (congestive) heart failure: Secondary | ICD-10-CM | POA: Diagnosis not present

## 2020-10-13 NOTE — Telephone Encounter (Signed)
Got note from [place] with CCM that they are working on this

## 2020-10-13 NOTE — Telephone Encounter (Signed)
Copied from [place] [id number]. Topic: General - Call Back - No Documentation >> Oct 13, 2020 11:29 AM Erick Blinks wrote: Reason for CRM: Maximino Sarin called from [facility], reporting that the family of the patient is anxious to move her into their faciility. But they need paperwork completed first that was faxed yesterday. Needs FL2, notes, and addendum. And anything that Deatra Ina sent [facility]).   Best contact: [phone] ext [phone]

## 2020-10-14 DIAGNOSIS — Z111 Encounter for screening for respiratory tuberculosis: Secondary | ICD-10-CM | POA: Diagnosis not present

## 2020-10-16 DIAGNOSIS — I5031 Acute diastolic (congestive) heart failure: Secondary | ICD-10-CM | POA: Diagnosis not present

## 2020-10-16 DIAGNOSIS — I700 Atherosclerosis of aorta: Secondary | ICD-10-CM | POA: Diagnosis not present

## 2020-10-16 DIAGNOSIS — J9601 Acute respiratory failure with hypoxia: Secondary | ICD-10-CM | POA: Diagnosis not present

## 2020-10-16 DIAGNOSIS — I110 Hypertensive heart disease with heart failure: Secondary | ICD-10-CM | POA: Diagnosis not present

## 2020-10-16 DIAGNOSIS — J45901 Unspecified asthma with (acute) exacerbation: Secondary | ICD-10-CM | POA: Diagnosis not present

## 2020-10-16 DIAGNOSIS — M1711 Unilateral primary osteoarthritis, right knee: Secondary | ICD-10-CM | POA: Diagnosis not present

## 2020-10-16 NOTE — Telephone Encounter (Addendum)
Pt daughter in law is calling we can call her husband dave back changing on FL-2 form

## 2020-10-16 NOTE — Telephone Encounter (Signed)
I just wanted to touch basis with you if you have been working on patients FL2 form? Please see messages below. KW

## 2020-10-16 NOTE — Telephone Encounter (Signed)
Pts son, [name], calling in very anxious. He states that he is needing to have this paperwork expedited as his mother is very much in need of care. He states that she has been to ED 5 times already. Please advise.

## 2020-10-17 DIAGNOSIS — J45901 Unspecified asthma with (acute) exacerbation: Secondary | ICD-10-CM | POA: Diagnosis not present

## 2020-10-17 DIAGNOSIS — I5031 Acute diastolic (congestive) heart failure: Secondary | ICD-10-CM | POA: Diagnosis not present

## 2020-10-17 DIAGNOSIS — M1711 Unilateral primary osteoarthritis, right knee: Secondary | ICD-10-CM | POA: Diagnosis not present

## 2020-10-17 DIAGNOSIS — J9601 Acute respiratory failure with hypoxia: Secondary | ICD-10-CM | POA: Diagnosis not present

## 2020-10-17 DIAGNOSIS — I700 Atherosclerosis of aorta: Secondary | ICD-10-CM | POA: Diagnosis not present

## 2020-10-17 DIAGNOSIS — S62291D Other fracture of first metacarpal bone, right hand, subsequent encounter for fracture with routine healing: Secondary | ICD-10-CM | POA: Diagnosis not present

## 2020-10-17 DIAGNOSIS — I110 Hypertensive heart disease with heart failure: Secondary | ICD-10-CM | POA: Diagnosis not present

## 2020-10-17 NOTE — Telephone Encounter (Signed)
Joy from [facility] called in about information on the [facility] form, Caryl Asp states this is very urgent and need ASAP. Please advise.

## 2020-10-17 NOTE — Telephone Encounter (Signed)
Received forms from [place]. Spoke with Felecia and she will be in the office tomorrow. Keeping forms on my desk per conversation with Felecia. -Nichole

## 2020-10-17 NOTE — Telephone Encounter (Signed)
Lea with [facility] calling to ask why the FL2 has not been sent back and a 6 page addendum?  She is going to come up to the office this afternoon. Cb [phone]

## 2020-10-18 ENCOUNTER — Ambulatory Visit: Payer: Self-pay

## 2020-10-18 ENCOUNTER — Telehealth: Payer: Self-pay

## 2020-10-18 DIAGNOSIS — I700 Atherosclerosis of aorta: Secondary | ICD-10-CM | POA: Diagnosis not present

## 2020-10-18 DIAGNOSIS — J45901 Unspecified asthma with (acute) exacerbation: Secondary | ICD-10-CM | POA: Diagnosis not present

## 2020-10-18 DIAGNOSIS — I110 Hypertensive heart disease with heart failure: Secondary | ICD-10-CM | POA: Diagnosis not present

## 2020-10-18 DIAGNOSIS — J9601 Acute respiratory failure with hypoxia: Secondary | ICD-10-CM | POA: Diagnosis not present

## 2020-10-18 DIAGNOSIS — I5031 Acute diastolic (congestive) heart failure: Secondary | ICD-10-CM | POA: Diagnosis not present

## 2020-10-18 DIAGNOSIS — M1711 Unilateral primary osteoarthritis, right knee: Secondary | ICD-10-CM | POA: Diagnosis not present

## 2020-10-18 NOTE — Telephone Encounter (Signed)
Dr B-I have put one on your desk   Copied from [place] [phone]. Topic: General - Other >> Oct 18, 2020 12:26 PM Leward Quan A wrote: Reason for CRM: Patient called in to inform Dr B that she would like to have a DNR for completed for her file and to have at home just in case something were to happen. Want to know if that form is available at the office or how can she obtain a copy. Ph#  [phone]

## 2020-10-18 NOTE — Telephone Encounter (Signed)
Thank You Jiles Garter! Dr. B has signed the form. Mrs. Tostenson's son [name] is aware that Nanine Means will need the original/signed DNR. He will stop by the front desk to pick it up.

## 2020-10-19 ENCOUNTER — Telehealth: Payer: Self-pay

## 2020-10-19 ENCOUNTER — Telehealth: Payer: Self-pay | Admitting: Family Medicine

## 2020-10-19 DIAGNOSIS — M1711 Unilateral primary osteoarthritis, right knee: Secondary | ICD-10-CM | POA: Diagnosis not present

## 2020-10-19 DIAGNOSIS — I700 Atherosclerosis of aorta: Secondary | ICD-10-CM | POA: Diagnosis not present

## 2020-10-19 DIAGNOSIS — J9601 Acute respiratory failure with hypoxia: Secondary | ICD-10-CM | POA: Diagnosis not present

## 2020-10-19 DIAGNOSIS — I5031 Acute diastolic (congestive) heart failure: Secondary | ICD-10-CM | POA: Diagnosis not present

## 2020-10-19 DIAGNOSIS — F5105 Insomnia due to other mental disorder: Secondary | ICD-10-CM | POA: Diagnosis not present

## 2020-10-19 DIAGNOSIS — F331 Major depressive disorder, recurrent, moderate: Secondary | ICD-10-CM | POA: Diagnosis not present

## 2020-10-19 DIAGNOSIS — F411 Generalized anxiety disorder: Secondary | ICD-10-CM | POA: Diagnosis not present

## 2020-10-19 DIAGNOSIS — I110 Hypertensive heart disease with heart failure: Secondary | ICD-10-CM | POA: Diagnosis not present

## 2020-10-19 DIAGNOSIS — J45901 Unspecified asthma with (acute) exacerbation: Secondary | ICD-10-CM | POA: Diagnosis not present

## 2020-10-19 NOTE — Chronic Care Management (AMB) (Signed)
Chronic Care Management   CCM RN Visit Note   Name: Wanda Ochoa MRN: [id number] DOB: [birth year]  Subjective: Wanda Ochoa is a 75 y.o. year old female who is a primary care patient of Bacigalupo, Dionne Bucy, MD. The care management team was consulted for assistance with disease management and care coordination needs.    Collaboration with Primary Care Provider and [facility]  in response to provider requests for assistance with facility placement   Assessment: Review of patient's past medical history, allergies, medications, health status, including review of consultants reports, laboratory and other test data, was performed as part of comprehensive evaluation and provision of chronic care management services.   SDOH (Social Determinants of Health) assessments and interventions performed:  No  CCM Care Plan  Allergies  Allergen Reactions   Penicillins Anaphylaxis   Lipitor [Atorvastatin] Rash    Severe rash on skin after taking approximately 2 weeks.     Outpatient Encounter Medications as of 10/18/2020  Medication Sig   albuterol (VENTOLIN HFA) 108 (90 Base) MCG/ACT inhaler Inhale 2 puffs into the lungs every 4 (four) hours as needed for wheezing or shortness of breath. INHALE 2 PUFFS INTO THE LUNGS EVERY 6 HOURS AS NEEDED   amLODipine (NORVASC) 5 MG tablet Take 1 tablet (5 mg total) by mouth daily.   aspirin 81 MG tablet Take 81 mg by mouth daily.   cholecalciferol (VITAMIN D) 25 MCG tablet Take 1 tablet (1,000 Units total) by mouth daily.   citalopram (CELEXA) 10 MG tablet Take 10 mg by mouth daily.   cyanocobalamin (,VITAMIN B-12,) 1000 MCG/ML injection Inject 1 mL (1,000 mcg total) into the muscle once a week. Once a week for 3 weeks and Than Once Monthly   fluticasone-salmeterol (ADVAIR HFA) 115-21 MCG/ACT inhaler Inhale 2 puffs into the lungs 2 (two) times daily.   furosemide (LASIX) 40 MG tablet Take 1 tablet (40 mg total) by mouth daily.   hydrOXYzine  (VISTARIL) 25 MG capsule Take 25 mg by mouth daily as needed for anxiety.   labetalol (NORMODYNE) 200 MG tablet Take 1 tablet (200 mg total) by mouth 2 (two) times daily.   levothyroxine (SYNTHROID) 150 MCG tablet Take 1 tablet (150 mcg total) by mouth daily.   losartan (COZAAR) 25 MG tablet Take 1 tablet (25 mg total) by mouth daily.   nortriptyline (PAMELOR) 25 MG capsule Take 3 capsule at bed time (Patient taking differently: Take 75 mg by mouth at bedtime.)   polyethylene glycol powder (GLYCOLAX/MIRALAX) 17 GM/SCOOP powder Take 17 g by mouth daily.   potassium chloride (KLOR-CON 10) 10 MEQ tablet Take 2 tablets (20 mEq total) by mouth daily.   pravastatin (PRAVACHOL) 20 MG tablet Take 20 mg by mouth daily.   QUEtiapine (SEROQUEL) 100 MG tablet Take 100 mg by mouth at bedtime.   Syringe/Needle, Disp, (SYRINGE 3CC/25GX1") 25G X 1" 3 ML MISC 1 each by Does not apply route every 30 (thirty) days.   vitamin B-12 (CYANOCOBALAMIN) 1000 MCG tablet Take 1 tablet (1,000 mcg total) by mouth daily.   No facility-administered encounter medications on file as of 10/18/2020.    Patient Active Problem List   Diagnosis Date Noted   Acute asthma exacerbation 09/23/2020   Acute congestive heart failure ([place])    Vitamin B12 deficiency 08/31/2020   Frequent falls 08/30/2020   Weakness 123456   Complicated grieving 123456   Drug reaction, subsequent encounter 09/23/2019   Rash and nonspecific skin eruption 09/23/2019   Stable  angina pectoris ([place]) 09/10/2019   Stage 3a chronic kidney disease ([place]) 09/10/2019   Idiopathic gout involving toe of right foot 12/07/2018   Hematuria 09/11/2016   Venous insufficiency of both lower extremities 09/04/2016   Severe recurrent major depression without psychotic features ([place])    Hypercholesteremia 05/02/2015   Mitral valve prolapse 02/21/2015   Allergic rhinitis 12/24/2014   Osteopenia 12/24/2014   Hypothyroidism 11/24/2014   Generalized anxiety disorder  02/24/2012   Nocturnal hypoxemia 01/20/2012   Abdominal mass 01/20/2012   Shortness of breath 01/20/2012   Temporary cerebral vascular dysfunction 01/02/2009   Benign hypertension 03/28/2008   Asthma, exogenous 10/22/2007   Adaptive colitis 03/12/2003     PLAN FL2 and requested documents signed by PCP and will be faxed to [facility]. Leah([facility] Point of Contact) agreed to call if additional documentation is required. [name]'s son will report to the clinic front desk to obtain requested DNR forms.    [facility] Care Management [facility] [phone]

## 2020-10-19 NOTE — Telephone Encounter (Signed)
FYI Update FL-2 forms:  FL-2 forms have been completed and ready to be faxed. Needing to attach last 2 OV notes and lab results. However, the OV note for 09/28/20 is currently locked and can not be accessed or completed at this time. A ticket has been placed. IT and vendor are working on this issue.   I called Lea with [facility]) to see if we could go ahead and send FL-2 and previous OV notes from 10/01/19,09/23/19, & 08/10/19 and then once OV note from 09/28/20 can be completed fax at a later date.   When Lea returns call please contact office and ask for Nichole. If I am not available please ask if ok to send FL-2 with the other OV notes and lab results.

## 2020-10-19 NOTE — Telephone Encounter (Signed)
Spoke with Dr Nicolasa Ducking (patient's psychiatrist) today. Patient is very upset by her recent visit with Simona Huh for a hospital follow-up. She told Dr Nicolasa Ducking that she was told that Simona Huh could not access the records and couldn't do anything for her. She was told that Simona Huh was retiring and that she might as well find a new PCP, because [facility] was hopeless after losing so many providers.   Please note, that I am not leaving the practice and I am her PCP.  Also, if you cannot access records, you should call them back later and still help handle the problems.  She does not know which antihypertensives to take. She has accelerating CKD and needs a renal referral. Her psychiatrist has now referred her to Neuro for these frequent falls and memory changes with no cause found.   Simona Huh, please take care of the patient as you should have during her visit.  If you have questions, I am happy to answer them.  Sarah, service recovery please.

## 2020-10-24 DIAGNOSIS — M1711 Unilateral primary osteoarthritis, right knee: Secondary | ICD-10-CM | POA: Diagnosis not present

## 2020-10-24 DIAGNOSIS — I5031 Acute diastolic (congestive) heart failure: Secondary | ICD-10-CM | POA: Diagnosis not present

## 2020-10-24 DIAGNOSIS — J45901 Unspecified asthma with (acute) exacerbation: Secondary | ICD-10-CM | POA: Diagnosis not present

## 2020-10-24 DIAGNOSIS — J9601 Acute respiratory failure with hypoxia: Secondary | ICD-10-CM | POA: Diagnosis not present

## 2020-10-24 DIAGNOSIS — I110 Hypertensive heart disease with heart failure: Secondary | ICD-10-CM | POA: Diagnosis not present

## 2020-10-24 DIAGNOSIS — I700 Atherosclerosis of aorta: Secondary | ICD-10-CM | POA: Diagnosis not present

## 2020-10-26 DIAGNOSIS — I110 Hypertensive heart disease with heart failure: Secondary | ICD-10-CM | POA: Diagnosis not present

## 2020-10-26 DIAGNOSIS — J9601 Acute respiratory failure with hypoxia: Secondary | ICD-10-CM | POA: Diagnosis not present

## 2020-10-26 DIAGNOSIS — I5031 Acute diastolic (congestive) heart failure: Secondary | ICD-10-CM | POA: Diagnosis not present

## 2020-10-26 DIAGNOSIS — I700 Atherosclerosis of aorta: Secondary | ICD-10-CM | POA: Diagnosis not present

## 2020-10-26 DIAGNOSIS — M1711 Unilateral primary osteoarthritis, right knee: Secondary | ICD-10-CM | POA: Diagnosis not present

## 2020-10-26 DIAGNOSIS — J45901 Unspecified asthma with (acute) exacerbation: Secondary | ICD-10-CM | POA: Diagnosis not present

## 2020-10-27 DIAGNOSIS — J9601 Acute respiratory failure with hypoxia: Secondary | ICD-10-CM | POA: Diagnosis not present

## 2020-10-27 DIAGNOSIS — E559 Vitamin D deficiency, unspecified: Secondary | ICD-10-CM | POA: Diagnosis not present

## 2020-10-27 DIAGNOSIS — F419 Anxiety disorder, unspecified: Secondary | ICD-10-CM | POA: Diagnosis not present

## 2020-10-27 DIAGNOSIS — E039 Hypothyroidism, unspecified: Secondary | ICD-10-CM | POA: Diagnosis not present

## 2020-10-27 DIAGNOSIS — I700 Atherosclerosis of aorta: Secondary | ICD-10-CM | POA: Diagnosis not present

## 2020-10-27 DIAGNOSIS — J45901 Unspecified asthma with (acute) exacerbation: Secondary | ICD-10-CM | POA: Diagnosis not present

## 2020-10-27 DIAGNOSIS — I5031 Acute diastolic (congestive) heart failure: Secondary | ICD-10-CM | POA: Diagnosis not present

## 2020-10-27 DIAGNOSIS — M1711 Unilateral primary osteoarthritis, right knee: Secondary | ICD-10-CM | POA: Diagnosis not present

## 2020-10-27 DIAGNOSIS — I110 Hypertensive heart disease with heart failure: Secondary | ICD-10-CM | POA: Diagnosis not present

## 2020-10-27 DIAGNOSIS — Z9181 History of falling: Secondary | ICD-10-CM | POA: Diagnosis not present

## 2020-10-27 DIAGNOSIS — E785 Hyperlipidemia, unspecified: Secondary | ICD-10-CM | POA: Diagnosis not present

## 2020-10-27 DIAGNOSIS — F32A Depression, unspecified: Secondary | ICD-10-CM | POA: Diagnosis not present

## 2020-10-27 DIAGNOSIS — E538 Deficiency of other specified B group vitamins: Secondary | ICD-10-CM | POA: Diagnosis not present

## 2020-10-27 DIAGNOSIS — S6291XD Unspecified fracture of right wrist and hand, subsequent encounter for fracture with routine healing: Secondary | ICD-10-CM | POA: Diagnosis not present

## 2020-10-29 ENCOUNTER — Other Ambulatory Visit: Payer: Self-pay | Admitting: Family Medicine

## 2020-10-29 DIAGNOSIS — E039 Hypothyroidism, unspecified: Secondary | ICD-10-CM

## 2020-10-29 NOTE — Telephone Encounter (Signed)
Dc'd 09/04/20 Change in dose Dr Jennye Boroughs

## 2020-10-30 DIAGNOSIS — J9601 Acute respiratory failure with hypoxia: Secondary | ICD-10-CM | POA: Diagnosis not present

## 2020-10-30 DIAGNOSIS — M1711 Unilateral primary osteoarthritis, right knee: Secondary | ICD-10-CM | POA: Diagnosis not present

## 2020-10-30 DIAGNOSIS — I110 Hypertensive heart disease with heart failure: Secondary | ICD-10-CM | POA: Diagnosis not present

## 2020-10-30 DIAGNOSIS — I5031 Acute diastolic (congestive) heart failure: Secondary | ICD-10-CM | POA: Diagnosis not present

## 2020-10-30 DIAGNOSIS — J45901 Unspecified asthma with (acute) exacerbation: Secondary | ICD-10-CM | POA: Diagnosis not present

## 2020-10-30 DIAGNOSIS — I700 Atherosclerosis of aorta: Secondary | ICD-10-CM | POA: Diagnosis not present

## 2020-10-31 DIAGNOSIS — Z79899 Other long term (current) drug therapy: Secondary | ICD-10-CM | POA: Diagnosis not present

## 2020-10-31 DIAGNOSIS — R600 Localized edema: Secondary | ICD-10-CM | POA: Diagnosis not present

## 2020-10-31 DIAGNOSIS — J9601 Acute respiratory failure with hypoxia: Secondary | ICD-10-CM | POA: Diagnosis not present

## 2020-10-31 DIAGNOSIS — I700 Atherosclerosis of aorta: Secondary | ICD-10-CM | POA: Diagnosis not present

## 2020-10-31 DIAGNOSIS — M1711 Unilateral primary osteoarthritis, right knee: Secondary | ICD-10-CM | POA: Diagnosis not present

## 2020-10-31 DIAGNOSIS — B3789 Other sites of candidiasis: Secondary | ICD-10-CM | POA: Diagnosis not present

## 2020-10-31 DIAGNOSIS — R0602 Shortness of breath: Secondary | ICD-10-CM | POA: Diagnosis not present

## 2020-10-31 DIAGNOSIS — J45901 Unspecified asthma with (acute) exacerbation: Secondary | ICD-10-CM | POA: Diagnosis not present

## 2020-10-31 DIAGNOSIS — I5031 Acute diastolic (congestive) heart failure: Secondary | ICD-10-CM | POA: Diagnosis not present

## 2020-10-31 DIAGNOSIS — R42 Dizziness and giddiness: Secondary | ICD-10-CM | POA: Diagnosis not present

## 2020-10-31 DIAGNOSIS — I10 Essential (primary) hypertension: Secondary | ICD-10-CM | POA: Diagnosis not present

## 2020-10-31 DIAGNOSIS — I110 Hypertensive heart disease with heart failure: Secondary | ICD-10-CM | POA: Diagnosis not present

## 2020-10-31 DIAGNOSIS — L602 Onychogryphosis: Secondary | ICD-10-CM | POA: Diagnosis not present

## 2020-11-01 DIAGNOSIS — R892 Abnormal level of other drugs, medicaments and biological substances in specimens from other organs, systems and tissues: Secondary | ICD-10-CM | POA: Diagnosis not present

## 2020-11-02 DIAGNOSIS — I110 Hypertensive heart disease with heart failure: Secondary | ICD-10-CM | POA: Diagnosis not present

## 2020-11-02 DIAGNOSIS — I700 Atherosclerosis of aorta: Secondary | ICD-10-CM | POA: Diagnosis not present

## 2020-11-02 DIAGNOSIS — J45901 Unspecified asthma with (acute) exacerbation: Secondary | ICD-10-CM | POA: Diagnosis not present

## 2020-11-02 DIAGNOSIS — I10 Essential (primary) hypertension: Secondary | ICD-10-CM | POA: Diagnosis not present

## 2020-11-02 DIAGNOSIS — J9601 Acute respiratory failure with hypoxia: Secondary | ICD-10-CM | POA: Diagnosis not present

## 2020-11-02 DIAGNOSIS — B3789 Other sites of candidiasis: Secondary | ICD-10-CM | POA: Diagnosis not present

## 2020-11-02 DIAGNOSIS — I5031 Acute diastolic (congestive) heart failure: Secondary | ICD-10-CM | POA: Diagnosis not present

## 2020-11-02 DIAGNOSIS — M1711 Unilateral primary osteoarthritis, right knee: Secondary | ICD-10-CM | POA: Diagnosis not present

## 2020-11-02 DIAGNOSIS — R600 Localized edema: Secondary | ICD-10-CM | POA: Diagnosis not present

## 2020-11-07 DIAGNOSIS — I341 Nonrheumatic mitral (valve) prolapse: Secondary | ICD-10-CM | POA: Diagnosis not present

## 2020-11-07 DIAGNOSIS — E782 Mixed hyperlipidemia: Secondary | ICD-10-CM | POA: Diagnosis not present

## 2020-11-07 DIAGNOSIS — I872 Venous insufficiency (chronic) (peripheral): Secondary | ICD-10-CM | POA: Diagnosis not present

## 2020-11-07 DIAGNOSIS — I38 Endocarditis, valve unspecified: Secondary | ICD-10-CM | POA: Diagnosis not present

## 2020-11-07 DIAGNOSIS — I10 Essential (primary) hypertension: Secondary | ICD-10-CM | POA: Diagnosis not present

## 2020-11-07 DIAGNOSIS — N1831 Chronic kidney disease, stage 3a: Secondary | ICD-10-CM | POA: Diagnosis not present

## 2020-11-08 ENCOUNTER — Emergency Department: Payer: Medicare Other

## 2020-11-08 ENCOUNTER — Other Ambulatory Visit: Payer: Self-pay

## 2020-11-08 ENCOUNTER — Emergency Department
Admission: EM | Admit: 2020-11-08 | Discharge: 2020-11-08 | Disposition: A | Discharge status: Home / Self Care | Payer: Medicare Other | Attending: Emergency Medicine | Admitting: Emergency Medicine

## 2020-11-08 DIAGNOSIS — I700 Atherosclerosis of aorta: Secondary | ICD-10-CM | POA: Diagnosis not present

## 2020-11-08 DIAGNOSIS — N1831 Chronic kidney disease, stage 3a: Secondary | ICD-10-CM | POA: Insufficient documentation

## 2020-11-08 DIAGNOSIS — Z7982 Long term (current) use of aspirin: Secondary | ICD-10-CM | POA: Insufficient documentation

## 2020-11-08 DIAGNOSIS — Z043 Encounter for examination and observation following other accident: Secondary | ICD-10-CM | POA: Diagnosis not present

## 2020-11-08 DIAGNOSIS — S79912A Unspecified injury of left hip, initial encounter: Secondary | ICD-10-CM | POA: Insufficient documentation

## 2020-11-08 DIAGNOSIS — S0990XA Unspecified injury of head, initial encounter: Secondary | ICD-10-CM | POA: Diagnosis not present

## 2020-11-08 DIAGNOSIS — Y9301 Activity, walking, marching and hiking: Secondary | ICD-10-CM | POA: Insufficient documentation

## 2020-11-08 DIAGNOSIS — R531 Weakness: Secondary | ICD-10-CM

## 2020-11-08 DIAGNOSIS — J45901 Unspecified asthma with (acute) exacerbation: Secondary | ICD-10-CM | POA: Insufficient documentation

## 2020-11-08 DIAGNOSIS — E039 Hypothyroidism, unspecified: Secondary | ICD-10-CM | POA: Insufficient documentation

## 2020-11-08 DIAGNOSIS — J9601 Acute respiratory failure with hypoxia: Secondary | ICD-10-CM | POA: Diagnosis not present

## 2020-11-08 DIAGNOSIS — I110 Hypertensive heart disease with heart failure: Secondary | ICD-10-CM | POA: Diagnosis not present

## 2020-11-08 DIAGNOSIS — R600 Localized edema: Secondary | ICD-10-CM | POA: Diagnosis not present

## 2020-11-08 DIAGNOSIS — M1711 Unilateral primary osteoarthritis, right knee: Secondary | ICD-10-CM | POA: Diagnosis not present

## 2020-11-08 DIAGNOSIS — Y92011 Dining room of single-family (private) house as the place of occurrence of the external cause: Secondary | ICD-10-CM | POA: Diagnosis not present

## 2020-11-08 DIAGNOSIS — I10 Essential (primary) hypertension: Secondary | ICD-10-CM | POA: Diagnosis not present

## 2020-11-08 DIAGNOSIS — Z79899 Other long term (current) drug therapy: Secondary | ICD-10-CM | POA: Diagnosis not present

## 2020-11-08 DIAGNOSIS — N3000 Acute cystitis without hematuria: Secondary | ICD-10-CM | POA: Insufficient documentation

## 2020-11-08 DIAGNOSIS — M50322 Other cervical disc degeneration at C5-C6 level: Secondary | ICD-10-CM | POA: Diagnosis not present

## 2020-11-08 DIAGNOSIS — I509 Heart failure, unspecified: Secondary | ICD-10-CM | POA: Insufficient documentation

## 2020-11-08 DIAGNOSIS — W01198A Fall on same level from slipping, tripping and stumbling with subsequent striking against other object, initial encounter: Secondary | ICD-10-CM | POA: Insufficient documentation

## 2020-11-08 DIAGNOSIS — Z7951 Long term (current) use of inhaled steroids: Secondary | ICD-10-CM | POA: Diagnosis not present

## 2020-11-08 DIAGNOSIS — I130 Hypertensive heart and chronic kidney disease with heart failure and stage 1 through stage 4 chronic kidney disease, or unspecified chronic kidney disease: Secondary | ICD-10-CM | POA: Diagnosis not present

## 2020-11-08 DIAGNOSIS — I5031 Acute diastolic (congestive) heart failure: Secondary | ICD-10-CM | POA: Diagnosis not present

## 2020-11-08 DIAGNOSIS — R0902 Hypoxemia: Secondary | ICD-10-CM | POA: Diagnosis not present

## 2020-11-08 DIAGNOSIS — W19XXXA Unspecified fall, initial encounter: Secondary | ICD-10-CM | POA: Diagnosis not present

## 2020-11-08 LAB — URINALYSIS, COMPLETE (UACMP) WITH MICROSCOPIC
Bilirubin Urine: NEGATIVE
Glucose, UA: NEGATIVE mg/dL
Hgb urine dipstick: NEGATIVE
Ketones, ur: NEGATIVE mg/dL
Nitrite: NEGATIVE
Protein, ur: 30 mg/dL — AB
Specific Gravity, Urine: 1.014 (ref 1.005–1.030)
WBC, UA: >50 WBC/hpf — ABNORMAL HIGH (ref 0–5)
pH: 6 (ref 5.0–8.0)

## 2020-11-08 LAB — BASIC METABOLIC PANEL
Anion gap: 7 (ref 5–15)
BUN: 13 mg/dL (ref 8–23)
CO2: 26 mmol/L (ref 22–32)
Calcium: 8.5 mg/dL — ABNORMAL LOW (ref 8.9–10.3)
Chloride: 104 mmol/L (ref 98–111)
Creatinine, Ser: 1.09 mg/dL — ABNORMAL HIGH (ref 0.44–1.00)
GFR, Estimated: 53 mL/min — ABNORMAL LOW (ref >60)
Glucose, Bld: 122 mg/dL — ABNORMAL HIGH (ref 70–99)
Potassium: 3.4 mmol/L — ABNORMAL LOW (ref 3.5–5.1)
Sodium: 137 mmol/L (ref 135–145)

## 2020-11-08 LAB — CBC
HCT: 28.3 % — ABNORMAL LOW (ref 36.0–46.0)
Hemoglobin: 9.3 g/dL — ABNORMAL LOW (ref 12.0–15.0)
MCH: 32.4 pg (ref 26.0–34.0)
MCHC: 32.9 g/dL (ref 30.0–36.0)
MCV: 98.6 fL (ref 80.0–100.0)
Platelets: 206 10*3/uL (ref 150–400)
RBC: 2.87 MIL/uL — ABNORMAL LOW (ref 3.87–5.11)
RDW: 13.2 % (ref 11.5–15.5)
WBC: 5.1 10*3/uL (ref 4.0–10.5)
nRBC: 0 % (ref 0.0–0.2)

## 2020-11-08 MED ORDER — SULFAMETHOXAZOLE-TRIMETHOPRIM 800-160 MG PO TABS: 1.0000 | ORAL_TABLET | Freq: Two times a day (BID) | ORAL | 0 refills | Status: AC

## 2020-11-08 MED ORDER — SULFAMETHOXAZOLE-TRIMETHOPRIM 800-160 MG PO TABS
1.0000 | ORAL_TABLET | Freq: Once | ORAL | Status: AC
  Administered 2020-11-08: 1 via ORAL
  Filled 2020-11-08: qty 1

## 2020-11-08 NOTE — ED Provider Notes (Signed)
[facility] Emergency Department Provider Note   ____________________________________________   Event Date/Time   First MD Initiated Contact with Patient 11/08/20 2050     (approximate)  I have reviewed the triage vital signs and the nursing notes.   HISTORY  Chief Complaint Fall    HPI Wanda Ochoa is a 75 y.o. female with below stated past medical history presents for multiple falls over the last 2-3 days.  Patient states that she has had the back of her head as well as falling onto the left side with some left hip pain.  Patient was told by her long-term care facility that she needed to present to the emergency department for evaluation however she denies any complaints at this time.          Past Medical History:  Diagnosis Date   Anxiety    Arthritis    right knee   Asthma    Depression    catatonic depression per pt report   HTN (hypertension)    Thyroid disease     Patient Active Problem List   Diagnosis Date Noted   Acute asthma exacerbation 09/23/2020   Acute congestive heart failure ([place])    Vitamin B12 deficiency 08/31/2020   Frequent falls 08/30/2020   Weakness 123456   Complicated grieving 123456   Drug reaction, subsequent encounter 09/23/2019   Rash and nonspecific skin eruption 09/23/2019   Stable angina pectoris ([place]) 09/10/2019   Stage 3a chronic kidney disease ([place]) 09/10/2019   Idiopathic gout involving toe of right foot 12/07/2018   Hematuria 09/11/2016   Venous insufficiency of both lower extremities 09/04/2016   Severe recurrent major depression without psychotic features ([place])    Hypercholesteremia 05/02/2015   Mitral valve prolapse 02/21/2015   Allergic rhinitis 12/24/2014   Osteopenia 12/24/2014   Hypothyroidism 11/24/2014   Generalized anxiety disorder 02/24/2012   Nocturnal hypoxemia 01/20/2012   Abdominal mass 01/20/2012   Shortness of breath 01/20/2012   Temporary cerebral vascular  dysfunction 01/02/2009   Benign hypertension 03/28/2008   Asthma, exogenous 10/22/2007   Adaptive colitis 03/12/2003    Past Surgical History:  Procedure Laterality Date   ABDOMINAL HYSTERECTOMY  1990   BLADDER SUSPENSION  1990   BREAST CYST ASPIRATION Right 1985   BREAST EXCISIONAL BIOPSY Right 1967   EXCISIONAL BX - NEG   BREAST SURGERY Right 1967   Lumpectomy   CESAREAN SECTION     COLONOSCOPY WITH PROPOFOL N/A 12/18/2018   Procedure: COLONOSCOPY WITH PROPOFOL;  Surgeon: Lollie Sails, MD;  Location: [facility] ENDOSCOPY;  Service: Endoscopy;  Laterality: N/A;   [place]    Prior to Admission medications   Medication Sig Start Date End Date Taking? Authorizing Provider  sulfamethoxazole-trimethoprim (BACTRIM DS) 800-160 MG tablet Take 1 tablet by mouth 2 (two) times daily for 5 days. 11/08/20 11/13/20 Yes Naaman Plummer, MD  albuterol (VENTOLIN HFA) 108 (90 Base) MCG/ACT inhaler Inhale 2 puffs into the lungs every 4 (four) hours as needed for wheezing or shortness of breath. INHALE 2 PUFFS INTO THE LUNGS EVERY 6 HOURS AS NEEDED 09/26/20   Elodia Florence., MD  amLODipine (NORVASC) 5 MG tablet Take 1 tablet (5 mg total) by mouth daily. 09/26/20 10/26/20  Elodia Florence., MD  aspirin 81 MG tablet Take 81 mg by mouth daily.    [provider]  citalopram (CELEXA) 10 MG tablet Take 10 mg by mouth daily. 08/08/19  [provider]  cyanocobalamin (,VITAMIN B-12,) 1000 MCG/ML injection Inject 1 mL (1,000 mcg total) into the muscle once a week. Once a week for 3 weeks and Than Once Monthly 10/06/20   Chrismon, Vickki Muff, PA-C  fluticasone-salmeterol (ADVAIR HFA) 115-21 MCG/ACT inhaler Inhale 2 puffs into the lungs 2 (two) times daily. 09/26/20   Elodia Florence., MD  furosemide (LASIX) 40 MG tablet Take 1 tablet (40 mg total) by mouth daily. 09/26/20 10/26/20  Elodia Florence., MD  hydrOXYzine (VISTARIL) 25 MG capsule Take 25  mg by mouth daily as needed for anxiety. 07/03/20   [provider]  labetalol (NORMODYNE) 200 MG tablet Take 1 tablet (200 mg total) by mouth 2 (two) times daily. 09/04/20   Jennye Boroughs, MD  levothyroxine (SYNTHROID) 150 MCG tablet Take 1 tablet (150 mcg total) by mouth daily. 09/04/20   Jennye Boroughs, MD  losartan (COZAAR) 25 MG tablet Take 1 tablet (25 mg total) by mouth daily. 09/26/20 10/26/20  Elodia Florence., MD  nortriptyline (PAMELOR) 25 MG capsule Take 3 capsule at bed time Patient taking differently: Take 75 mg by mouth at bedtime. 09/04/20   Jennye Boroughs, MD  polyethylene glycol powder (GLYCOLAX/MIRALAX) 17 GM/SCOOP powder Take 17 g by mouth daily.    [provider]  potassium chloride (KLOR-CON 10) 10 MEQ tablet Take 2 tablets (20 mEq total) by mouth daily. 09/26/20 10/26/20  Elodia Florence., MD  pravastatin (PRAVACHOL) 20 MG tablet Take 20 mg by mouth daily.    [provider]  QUEtiapine (SEROQUEL) 100 MG tablet Take 100 mg by mouth at bedtime.    [provider]  Syringe/Needle, Disp, (SYRINGE 3CC/25GX1") 25G X 1" 3 ML MISC 1 each by Does not apply route every 30 (thirty) days. 10/04/20   Virginia Crews, MD  vitamin B-12 (CYANOCOBALAMIN) 1000 MCG tablet Take 1 tablet (1,000 mcg total) by mouth daily. 09/30/20   Jennye Boroughs, MD    Allergies Penicillins and Lipitor [atorvastatin]  Family History  Problem Relation Age of Onset   Depression Father    Suicidality Father    Depression Sister    Breast cancer Sister 31   Autism Son    Heart disease Mother    Colon cancer Mother    Hypertension Mother    Anxiety disorder Mother    Mental illness Daughter    Allergies Brother    Allergies Sister    Asthma Maternal Uncle    Heart disease Maternal Grandfather    Heart disease Maternal Grandmother    Breast cancer Paternal Grandmother    Breast cancer Other     Social History Social History   Tobacco Use   Smoking  status: Never   Smokeless tobacco: Never  Vaping Use   Vaping Use: Never used  Substance Use Topics   Alcohol use: No   Drug use: No    Review of Systems Constitutional: No fever/chills Eyes: No visual changes. ENT: No sore throat. Cardiovascular: Denies chest pain. Respiratory: Denies shortness of breath. Gastrointestinal: No abdominal pain.  No nausea, no vomiting.  No diarrhea. Genitourinary: Negative for dysuria. Musculoskeletal: Negative for acute arthralgias Skin: Negative for rash. Neurological: Negative for headaches, weakness/numbness/paresthesias in any extremity Psychiatric: Negative for suicidal ideation/homicidal ideation   ____________________________________________   PHYSICAL EXAM:  VITAL SIGNS: ED Triage Vitals  Enc Vitals Group     BP 11/08/20 1808 136/68     Pulse Rate 11/08/20 1808 79  Resp 11/08/20 1808 16     Temp 11/08/20 1808 98.8 F (37.1 C)     Temp Source 11/08/20 1808 Oral     SpO2 11/08/20 1808 95 %     Weight 11/08/20 1809 165 lb 5.5 oz (75 kg)     Height 11/08/20 1809 '5\' 9"'$  (1.753 m)     Head Circumference --      Peak Flow --      Pain Score 11/08/20 1809 2     Pain Loc --      Pain Edu? --      Excl. in [place]? --    Constitutional: Alert and oriented. Well appearing and in no acute distress. Eyes: Conjunctivae are normal. PERRL. Head: Atraumatic. Nose: No congestion/rhinnorhea. Mouth/Throat: Mucous membranes are moist. Neck: No stridor Cardiovascular: Grossly normal heart sounds.  Good peripheral circulation. Respiratory: Normal respiratory effort.  No retractions. Gastrointestinal: Soft and nontender. No distention. Musculoskeletal: No obvious deformities Neurologic:  Normal speech and language. No gross focal neurologic deficits are appreciated. Skin:  Skin is warm and dry. No rash noted. Psychiatric: Mood and affect are normal. Speech and behavior are normal.  ____________________________________________   LABS (all  labs ordered are listed, but only abnormal results are displayed)  Labs Reviewed  BASIC METABOLIC PANEL - Abnormal; Notable for the following components:      Result Value   Potassium 3.4 (*)    Glucose, Bld 122 (*)    Creatinine, Ser 1.09 (*)    Calcium 8.5 (*)    GFR, Estimated 53 (*)    All other components within normal limits  CBC - Abnormal; Notable for the following components:   RBC 2.87 (*)    Hemoglobin 9.3 (*)    HCT 28.3 (*)    All other components within normal limits  URINALYSIS, COMPLETE (UACMP) WITH MICROSCOPIC - Abnormal; Notable for the following components:   Color, Urine YELLOW (*)    APPearance HAZY (*)    Protein, ur 30 (*)    Leukocytes,Ua LARGE (*)    WBC, UA >50 (*)    Bacteria, UA RARE (*)    All other components within normal limits   ____________________________________________  EKG  ED ECG REPORT I, Naaman Plummer, the attending physician, personally viewed and interpreted this ECG.  Date: 11/08/2020 EKG Time: 1817 Rate: 79 Rhythm: normal sinus rhythm QRS Axis: normal Intervals: normal ST/T Wave abnormalities: normal Narrative Interpretation: no evidence of acute ischemia  ____________________________________________  RADIOLOGY  ED MD interpretation: CT of the head and cervical spine show no evidence of acute abnormalities  X-ray of the left hip does not show any evidence of acute fractures or dislocations  Official radiology report(s): CT Head Wo Contrast  Result Date: 11/08/2020 CLINICAL DATA:  Head injury after fall. EXAM: CT HEAD WITHOUT CONTRAST CT CERVICAL SPINE WITHOUT CONTRAST TECHNIQUE: Multidetector CT imaging of the head and cervical spine was performed following the standard protocol without intravenous contrast. Multiplanar CT image reconstructions of the cervical spine were also generated. COMPARISON:  October 10, 2020. FINDINGS: CT HEAD FINDINGS Brain: No evidence of acute infarction, hemorrhage, hydrocephalus, extra-axial  collection or mass lesion/mass effect. Vascular: No hyperdense vessel or unexpected calcification. Skull: Normal. Negative for fracture or focal lesion. Sinuses/Orbits: No acute finding. Other: None. CT CERVICAL SPINE FINDINGS Alignment: Normal. Skull base and vertebrae: No acute fracture. No primary bone lesion or focal pathologic process. Soft tissues and spinal canal: No prevertebral fluid or swelling. No visible canal hematoma. Disc  levels: Moderate degenerative disc disease is noted at C5-6 with anterior posterior osteophyte formation. Upper chest: Negative. Other: None. IMPRESSION: No acute intracranial abnormality seen. Moderate degenerative disc disease is noted at C5-6. No acute abnormality seen in the cervical spine. Electronically Signed   By: Marijo Conception M.D.   On: 11/08/2020 21:42   CT Cervical Spine Wo Contrast  Result Date: 11/08/2020 CLINICAL DATA:  Head injury after fall. EXAM: CT HEAD WITHOUT CONTRAST CT CERVICAL SPINE WITHOUT CONTRAST TECHNIQUE: Multidetector CT imaging of the head and cervical spine was performed following the standard protocol without intravenous contrast. Multiplanar CT image reconstructions of the cervical spine were also generated. COMPARISON:  October 10, 2020. FINDINGS: CT HEAD FINDINGS Brain: No evidence of acute infarction, hemorrhage, hydrocephalus, extra-axial collection or mass lesion/mass effect. Vascular: No hyperdense vessel or unexpected calcification. Skull: Normal. Negative for fracture or focal lesion. Sinuses/Orbits: No acute finding. Other: None. CT CERVICAL SPINE FINDINGS Alignment: Normal. Skull base and vertebrae: No acute fracture. No primary bone lesion or focal pathologic process. Soft tissues and spinal canal: No prevertebral fluid or swelling. No visible canal hematoma. Disc levels: Moderate degenerative disc disease is noted at C5-6 with anterior posterior osteophyte formation. Upper chest: Negative. Other: None. IMPRESSION: No acute intracranial  abnormality seen. Moderate degenerative disc disease is noted at C5-6. No acute abnormality seen in the cervical spine. Electronically Signed   By: Marijo Conception M.D.   On: 11/08/2020 21:42   DG Hip Unilat With Pelvis 2-3 Views Left  Result Date: 11/08/2020 CLINICAL DATA:  Fall with left hip edema EXAM: DG HIP (WITH OR WITHOUT PELVIS) 2-3V LEFT COMPARISON:  None. FINDINGS: There is no evidence of hip fracture or dislocation. There is no evidence of arthropathy or other focal bone abnormality. IMPRESSION: Negative. Electronically Signed   By: Donavan Foil M.D.   On: 11/08/2020 21:23    ____________________________________________   PROCEDURES  Procedure(s) performed (including Critical Care):  .1-3 Lead EKG Interpretation  Date/Time: 11/08/2020 10:48 PM Performed by: Naaman Plummer, MD Authorized by: Naaman Plummer, MD     Interpretation: normal     ECG rate:  81   ECG rate assessment: normal     Rhythm: sinus rhythm     Ectopy: none     Conduction: normal     ____________________________________________   INITIAL IMPRESSION / ASSESSMENT AND PLAN / ED COURSE  As part of my medical decision making, I reviewed the following data within the electronic medical record, if available:  Nursing notes reviewed and incorporated, Labs reviewed, EKG interpreted, Old chart reviewed, Radiograph reviewed and Notes from prior ED visits reviewed and incorporated      Patient presenting with head trauma.  [name] neurological exam was non-focal and unremarkable.  [place] Head CT Rule was applied and patient did not fall into the low risk category so a head CT was obtained.  This showed no significant findings.  At this time, it is felt that the most likely explanation for the patient's symptoms is concussion.   I also considered SAH, SDH, Epidural Hematoma, IPH, skull fracture, migraine but this appears less likely considering the data gathered thus far.   Patient provided fall precautions.    Patient remained stable and neurologically intact while in the emergency department.  Discussed warning signs that would prompt return to ED.  Head trauma handout was provided.  Discussed in detail concussion management.  No sports or strenuous activity until symptoms free.  Return to emergency  department urgently if new or worsening symptoms develop.    Impression:  Head injury Left hip injury  Plan  Discharge from ED Tylenol for pain control. Avoid aspirin, NSAIDs, or other blood thinners. Advised patient on supportive measures for cognitive rest - avoid use of cognitive function for at least 24 hours.  This means no tv, books, texting, computers, etc. Limit visitors to the house.  Head trauma instructions provided in discharge instructions Instructed Pt to monitor for neurologic symptoms, severe HA, change in mental status, seizures, loss of conciousness. Instructed Pt to f/up w/ PCP in 3 days or ETC should symptoms worsen or not improve. Pt verbally expressed understanding and all questions were addressed to Pt's satisfaction.      ____________________________________________   FINAL CLINICAL IMPRESSION(S) / ED DIAGNOSES  Final diagnoses:  Generalized weakness  Acute cystitis without hematuria     ED Discharge Orders          Ordered    sulfamethoxazole-trimethoprim (BACTRIM DS) 800-160 MG tablet  2 times daily        11/08/20 2243             Note:  This document was prepared using Dragon voice recognition software and may include unintentional dictation errors.    Naaman Plummer, MD 11/08/20 [phone]

## 2020-11-08 NOTE — ED Triage Notes (Signed)
Pt comes into the ED via EMS from [facility] assisted [facility], states she was walking to the dining hall and her legs became weak and buckled causing her to fall and hit the back of her head, denies LOC 186/90 SR 82 95%RA

## 2020-11-08 NOTE — ED Triage Notes (Signed)
Pt to ED [facility] from [facility] for fall today, reports unsure what made her fall, just "got weak" reports multiple falls recently. States hit back of head Reports knot of left hip, able to move legs full range of motion No hematoma/laceration/bleeding noted to head. Pt denies pain No blood thinner use

## 2020-11-10 DIAGNOSIS — Z79899 Other long term (current) drug therapy: Secondary | ICD-10-CM | POA: Diagnosis not present

## 2020-11-13 DIAGNOSIS — I700 Atherosclerosis of aorta: Secondary | ICD-10-CM | POA: Diagnosis not present

## 2020-11-13 DIAGNOSIS — I110 Hypertensive heart disease with heart failure: Secondary | ICD-10-CM | POA: Diagnosis not present

## 2020-11-13 DIAGNOSIS — J45901 Unspecified asthma with (acute) exacerbation: Secondary | ICD-10-CM | POA: Diagnosis not present

## 2020-11-13 DIAGNOSIS — M1711 Unilateral primary osteoarthritis, right knee: Secondary | ICD-10-CM | POA: Diagnosis not present

## 2020-11-13 DIAGNOSIS — I5031 Acute diastolic (congestive) heart failure: Secondary | ICD-10-CM | POA: Diagnosis not present

## 2020-11-13 DIAGNOSIS — J9601 Acute respiratory failure with hypoxia: Secondary | ICD-10-CM | POA: Diagnosis not present

## 2020-11-14 DIAGNOSIS — J9601 Acute respiratory failure with hypoxia: Secondary | ICD-10-CM | POA: Diagnosis not present

## 2020-11-14 DIAGNOSIS — I5031 Acute diastolic (congestive) heart failure: Secondary | ICD-10-CM | POA: Diagnosis not present

## 2020-11-14 DIAGNOSIS — J45901 Unspecified asthma with (acute) exacerbation: Secondary | ICD-10-CM | POA: Diagnosis not present

## 2020-11-14 DIAGNOSIS — I700 Atherosclerosis of aorta: Secondary | ICD-10-CM | POA: Diagnosis not present

## 2020-11-14 DIAGNOSIS — M1711 Unilateral primary osteoarthritis, right knee: Secondary | ICD-10-CM | POA: Diagnosis not present

## 2020-11-14 DIAGNOSIS — I110 Hypertensive heart disease with heart failure: Secondary | ICD-10-CM | POA: Diagnosis not present

## 2020-11-14 DIAGNOSIS — M25552 Pain in left hip: Secondary | ICD-10-CM | POA: Diagnosis not present

## 2020-11-14 DIAGNOSIS — W19XXXA Unspecified fall, initial encounter: Secondary | ICD-10-CM | POA: Diagnosis not present

## 2020-11-16 DIAGNOSIS — F5105 Insomnia due to other mental disorder: Secondary | ICD-10-CM | POA: Diagnosis not present

## 2020-11-16 DIAGNOSIS — F331 Major depressive disorder, recurrent, moderate: Secondary | ICD-10-CM | POA: Diagnosis not present

## 2020-11-16 DIAGNOSIS — F411 Generalized anxiety disorder: Secondary | ICD-10-CM | POA: Diagnosis not present

## 2020-11-20 DIAGNOSIS — J45901 Unspecified asthma with (acute) exacerbation: Secondary | ICD-10-CM | POA: Diagnosis not present

## 2020-11-20 DIAGNOSIS — I5031 Acute diastolic (congestive) heart failure: Secondary | ICD-10-CM | POA: Diagnosis not present

## 2020-11-20 DIAGNOSIS — J9601 Acute respiratory failure with hypoxia: Secondary | ICD-10-CM | POA: Diagnosis not present

## 2020-11-20 DIAGNOSIS — I700 Atherosclerosis of aorta: Secondary | ICD-10-CM | POA: Diagnosis not present

## 2020-11-20 DIAGNOSIS — M1711 Unilateral primary osteoarthritis, right knee: Secondary | ICD-10-CM | POA: Diagnosis not present

## 2020-11-20 DIAGNOSIS — I110 Hypertensive heart disease with heart failure: Secondary | ICD-10-CM | POA: Diagnosis not present

## 2020-11-21 ENCOUNTER — Telehealth: Payer: Self-pay

## 2020-11-21 DIAGNOSIS — F061 Catatonic disorder due to known physiological condition: Secondary | ICD-10-CM | POA: Diagnosis not present

## 2020-11-21 DIAGNOSIS — I700 Atherosclerosis of aorta: Secondary | ICD-10-CM | POA: Diagnosis not present

## 2020-11-21 DIAGNOSIS — R2689 Other abnormalities of gait and mobility: Secondary | ICD-10-CM | POA: Diagnosis not present

## 2020-11-21 DIAGNOSIS — M1711 Unilateral primary osteoarthritis, right knee: Secondary | ICD-10-CM | POA: Diagnosis not present

## 2020-11-21 DIAGNOSIS — M818 Other osteoporosis without current pathological fracture: Secondary | ICD-10-CM | POA: Diagnosis not present

## 2020-11-21 DIAGNOSIS — F339 Major depressive disorder, recurrent, unspecified: Secondary | ICD-10-CM | POA: Diagnosis not present

## 2020-11-21 DIAGNOSIS — J45901 Unspecified asthma with (acute) exacerbation: Secondary | ICD-10-CM | POA: Diagnosis not present

## 2020-11-21 DIAGNOSIS — J9601 Acute respiratory failure with hypoxia: Secondary | ICD-10-CM | POA: Diagnosis not present

## 2020-11-21 DIAGNOSIS — G629 Polyneuropathy, unspecified: Secondary | ICD-10-CM | POA: Diagnosis not present

## 2020-11-21 DIAGNOSIS — I110 Hypertensive heart disease with heart failure: Secondary | ICD-10-CM | POA: Diagnosis not present

## 2020-11-21 DIAGNOSIS — I5031 Acute diastolic (congestive) heart failure: Secondary | ICD-10-CM | POA: Diagnosis not present

## 2020-11-21 NOTE — Telephone Encounter (Signed)
OK for verbals 

## 2020-11-21 NOTE — Telephone Encounter (Signed)
Left message for Jenny Reichmann approving orders.

## 2020-11-21 NOTE — Telephone Encounter (Signed)
Copied from [place] [id number]. Topic: Quick Communication - Home Health Verbal Orders >> Nov 21, 2020 10:55 AM Tessa Lerner A wrote: Caller/Agency: Jenny Reichmann / [place] Number: [phone]   Requesting OT/PT/Skilled Nursing/Social Work/Speech Therapy: PT  Frequency: 1w1 2w4 1w4

## 2020-11-22 ENCOUNTER — Other Ambulatory Visit: Payer: Self-pay | Admitting: Neurology

## 2020-11-22 DIAGNOSIS — R2689 Other abnormalities of gait and mobility: Secondary | ICD-10-CM

## 2020-11-24 ENCOUNTER — Telehealth: Payer: Self-pay | Admitting: Family Medicine

## 2020-11-24 DIAGNOSIS — I700 Atherosclerosis of aorta: Secondary | ICD-10-CM | POA: Diagnosis not present

## 2020-11-24 DIAGNOSIS — I110 Hypertensive heart disease with heart failure: Secondary | ICD-10-CM | POA: Diagnosis not present

## 2020-11-24 DIAGNOSIS — N390 Urinary tract infection, site not specified: Secondary | ICD-10-CM | POA: Diagnosis not present

## 2020-11-24 DIAGNOSIS — I10 Essential (primary) hypertension: Secondary | ICD-10-CM | POA: Diagnosis not present

## 2020-11-24 DIAGNOSIS — J45901 Unspecified asthma with (acute) exacerbation: Secondary | ICD-10-CM | POA: Diagnosis not present

## 2020-11-24 DIAGNOSIS — I5031 Acute diastolic (congestive) heart failure: Secondary | ICD-10-CM | POA: Diagnosis not present

## 2020-11-24 DIAGNOSIS — J9601 Acute respiratory failure with hypoxia: Secondary | ICD-10-CM | POA: Diagnosis not present

## 2020-11-24 DIAGNOSIS — E782 Mixed hyperlipidemia: Secondary | ICD-10-CM | POA: Diagnosis not present

## 2020-11-24 DIAGNOSIS — M1711 Unilateral primary osteoarthritis, right knee: Secondary | ICD-10-CM | POA: Diagnosis not present

## 2020-11-24 NOTE — Telephone Encounter (Signed)
[employer] copy with [facility] is calling to report the pt missed on 11-11-2020 home health aid visit due to aid was on vacation

## 2020-11-26 DIAGNOSIS — I700 Atherosclerosis of aorta: Secondary | ICD-10-CM | POA: Diagnosis not present

## 2020-11-26 DIAGNOSIS — J9601 Acute respiratory failure with hypoxia: Secondary | ICD-10-CM | POA: Diagnosis not present

## 2020-11-26 DIAGNOSIS — I110 Hypertensive heart disease with heart failure: Secondary | ICD-10-CM | POA: Diagnosis not present

## 2020-11-26 DIAGNOSIS — M1711 Unilateral primary osteoarthritis, right knee: Secondary | ICD-10-CM | POA: Diagnosis not present

## 2020-11-26 DIAGNOSIS — E559 Vitamin D deficiency, unspecified: Secondary | ICD-10-CM | POA: Diagnosis not present

## 2020-11-26 DIAGNOSIS — Z7982 Long term (current) use of aspirin: Secondary | ICD-10-CM | POA: Diagnosis not present

## 2020-11-26 DIAGNOSIS — I5031 Acute diastolic (congestive) heart failure: Secondary | ICD-10-CM | POA: Diagnosis not present

## 2020-11-26 DIAGNOSIS — Z7951 Long term (current) use of inhaled steroids: Secondary | ICD-10-CM | POA: Diagnosis not present

## 2020-11-26 DIAGNOSIS — F32A Depression, unspecified: Secondary | ICD-10-CM | POA: Diagnosis not present

## 2020-11-26 DIAGNOSIS — F419 Anxiety disorder, unspecified: Secondary | ICD-10-CM | POA: Diagnosis not present

## 2020-11-26 DIAGNOSIS — J45901 Unspecified asthma with (acute) exacerbation: Secondary | ICD-10-CM | POA: Diagnosis not present

## 2020-11-26 DIAGNOSIS — E538 Deficiency of other specified B group vitamins: Secondary | ICD-10-CM | POA: Diagnosis not present

## 2020-11-26 DIAGNOSIS — E039 Hypothyroidism, unspecified: Secondary | ICD-10-CM | POA: Diagnosis not present

## 2020-11-26 DIAGNOSIS — E785 Hyperlipidemia, unspecified: Secondary | ICD-10-CM | POA: Diagnosis not present

## 2020-11-26 DIAGNOSIS — S6291XD Unspecified fracture of right wrist and hand, subsequent encounter for fracture with routine healing: Secondary | ICD-10-CM | POA: Diagnosis not present

## 2020-11-26 DIAGNOSIS — Z9181 History of falling: Secondary | ICD-10-CM | POA: Diagnosis not present

## 2020-11-28 ENCOUNTER — Ambulatory Visit
Admission: RE | Admit: 2020-11-28 | Discharge: 2020-11-28 | Disposition: A | Discharge status: Home / Self Care | Payer: Medicare Other | Source: Ambulatory Visit | Attending: Neurology | Admitting: Neurology

## 2020-11-28 ENCOUNTER — Other Ambulatory Visit: Payer: Self-pay

## 2020-11-28 DIAGNOSIS — I5031 Acute diastolic (congestive) heart failure: Secondary | ICD-10-CM | POA: Diagnosis not present

## 2020-11-28 DIAGNOSIS — I110 Hypertensive heart disease with heart failure: Secondary | ICD-10-CM | POA: Diagnosis not present

## 2020-11-28 DIAGNOSIS — I700 Atherosclerosis of aorta: Secondary | ICD-10-CM | POA: Diagnosis not present

## 2020-11-28 DIAGNOSIS — M1711 Unilateral primary osteoarthritis, right knee: Secondary | ICD-10-CM | POA: Diagnosis not present

## 2020-11-28 DIAGNOSIS — M40204 Unspecified kyphosis, thoracic region: Secondary | ICD-10-CM | POA: Diagnosis not present

## 2020-11-28 DIAGNOSIS — R2689 Other abnormalities of gait and mobility: Secondary | ICD-10-CM | POA: Insufficient documentation

## 2020-11-28 DIAGNOSIS — R296 Repeated falls: Secondary | ICD-10-CM | POA: Diagnosis not present

## 2020-11-28 DIAGNOSIS — J9601 Acute respiratory failure with hypoxia: Secondary | ICD-10-CM | POA: Diagnosis not present

## 2020-11-28 DIAGNOSIS — J45901 Unspecified asthma with (acute) exacerbation: Secondary | ICD-10-CM | POA: Diagnosis not present

## 2020-11-28 DIAGNOSIS — M5125 Other intervertebral disc displacement, thoracolumbar region: Secondary | ICD-10-CM | POA: Diagnosis not present

## 2020-11-28 DIAGNOSIS — M5124 Other intervertebral disc displacement, thoracic region: Secondary | ICD-10-CM | POA: Diagnosis not present

## 2020-11-28 NOTE — Telephone Encounter (Signed)
Noted  

## 2020-11-30 DIAGNOSIS — I110 Hypertensive heart disease with heart failure: Secondary | ICD-10-CM | POA: Diagnosis not present

## 2020-11-30 DIAGNOSIS — I5031 Acute diastolic (congestive) heart failure: Secondary | ICD-10-CM | POA: Diagnosis not present

## 2020-11-30 DIAGNOSIS — M1711 Unilateral primary osteoarthritis, right knee: Secondary | ICD-10-CM | POA: Diagnosis not present

## 2020-11-30 DIAGNOSIS — J9601 Acute respiratory failure with hypoxia: Secondary | ICD-10-CM | POA: Diagnosis not present

## 2020-11-30 DIAGNOSIS — I700 Atherosclerosis of aorta: Secondary | ICD-10-CM | POA: Diagnosis not present

## 2020-11-30 DIAGNOSIS — J45901 Unspecified asthma with (acute) exacerbation: Secondary | ICD-10-CM | POA: Diagnosis not present

## 2020-12-02 DIAGNOSIS — F411 Generalized anxiety disorder: Secondary | ICD-10-CM | POA: Diagnosis not present

## 2020-12-02 DIAGNOSIS — F331 Major depressive disorder, recurrent, moderate: Secondary | ICD-10-CM | POA: Diagnosis not present

## 2020-12-02 DIAGNOSIS — F5105 Insomnia due to other mental disorder: Secondary | ICD-10-CM | POA: Diagnosis not present

## 2020-12-05 DIAGNOSIS — Z23 Encounter for immunization: Secondary | ICD-10-CM | POA: Diagnosis not present

## 2020-12-05 DIAGNOSIS — M1711 Unilateral primary osteoarthritis, right knee: Secondary | ICD-10-CM | POA: Diagnosis not present

## 2020-12-05 DIAGNOSIS — J9601 Acute respiratory failure with hypoxia: Secondary | ICD-10-CM | POA: Diagnosis not present

## 2020-12-05 DIAGNOSIS — J45901 Unspecified asthma with (acute) exacerbation: Secondary | ICD-10-CM | POA: Diagnosis not present

## 2020-12-05 DIAGNOSIS — I700 Atherosclerosis of aorta: Secondary | ICD-10-CM | POA: Diagnosis not present

## 2020-12-05 DIAGNOSIS — I110 Hypertensive heart disease with heart failure: Secondary | ICD-10-CM | POA: Diagnosis not present

## 2020-12-05 DIAGNOSIS — I5031 Acute diastolic (congestive) heart failure: Secondary | ICD-10-CM | POA: Diagnosis not present

## 2020-12-07 DIAGNOSIS — F5101 Primary insomnia: Secondary | ICD-10-CM | POA: Diagnosis not present

## 2020-12-07 DIAGNOSIS — K5901 Slow transit constipation: Secondary | ICD-10-CM | POA: Diagnosis not present

## 2020-12-07 DIAGNOSIS — Z7409 Other reduced mobility: Secondary | ICD-10-CM | POA: Diagnosis not present

## 2020-12-07 DIAGNOSIS — I10 Essential (primary) hypertension: Secondary | ICD-10-CM | POA: Diagnosis not present

## 2020-12-07 DIAGNOSIS — I110 Hypertensive heart disease with heart failure: Secondary | ICD-10-CM | POA: Diagnosis not present

## 2020-12-07 DIAGNOSIS — E782 Mixed hyperlipidemia: Secondary | ICD-10-CM | POA: Diagnosis not present

## 2020-12-07 DIAGNOSIS — I5031 Acute diastolic (congestive) heart failure: Secondary | ICD-10-CM | POA: Diagnosis not present

## 2020-12-07 DIAGNOSIS — I700 Atherosclerosis of aorta: Secondary | ICD-10-CM | POA: Diagnosis not present

## 2020-12-07 DIAGNOSIS — E039 Hypothyroidism, unspecified: Secondary | ICD-10-CM | POA: Diagnosis not present

## 2020-12-07 DIAGNOSIS — N390 Urinary tract infection, site not specified: Secondary | ICD-10-CM | POA: Diagnosis not present

## 2020-12-07 DIAGNOSIS — F330 Major depressive disorder, recurrent, mild: Secondary | ICD-10-CM | POA: Diagnosis not present

## 2020-12-07 DIAGNOSIS — M1711 Unilateral primary osteoarthritis, right knee: Secondary | ICD-10-CM | POA: Diagnosis not present

## 2020-12-07 DIAGNOSIS — J9601 Acute respiratory failure with hypoxia: Secondary | ICD-10-CM | POA: Diagnosis not present

## 2020-12-07 DIAGNOSIS — J45901 Unspecified asthma with (acute) exacerbation: Secondary | ICD-10-CM | POA: Diagnosis not present

## 2020-12-07 DIAGNOSIS — F411 Generalized anxiety disorder: Secondary | ICD-10-CM | POA: Diagnosis not present

## 2020-12-11 ENCOUNTER — Other Ambulatory Visit: Payer: Self-pay

## 2020-12-11 MED ORDER — LOSARTAN POTASSIUM 25 MG PO TABS: 25.0000 mg | ORAL_TABLET | Freq: Every day | ORAL | 0 refills | Status: DC

## 2020-12-12 DIAGNOSIS — M1711 Unilateral primary osteoarthritis, right knee: Secondary | ICD-10-CM | POA: Diagnosis not present

## 2020-12-12 DIAGNOSIS — I5031 Acute diastolic (congestive) heart failure: Secondary | ICD-10-CM | POA: Diagnosis not present

## 2020-12-12 DIAGNOSIS — I700 Atherosclerosis of aorta: Secondary | ICD-10-CM | POA: Diagnosis not present

## 2020-12-12 DIAGNOSIS — J45901 Unspecified asthma with (acute) exacerbation: Secondary | ICD-10-CM | POA: Diagnosis not present

## 2020-12-12 DIAGNOSIS — J9601 Acute respiratory failure with hypoxia: Secondary | ICD-10-CM | POA: Diagnosis not present

## 2020-12-12 DIAGNOSIS — I110 Hypertensive heart disease with heart failure: Secondary | ICD-10-CM | POA: Diagnosis not present

## 2020-12-13 DIAGNOSIS — N1832 Chronic kidney disease, stage 3b: Secondary | ICD-10-CM | POA: Diagnosis not present

## 2020-12-13 DIAGNOSIS — I10 Essential (primary) hypertension: Secondary | ICD-10-CM | POA: Diagnosis not present

## 2020-12-13 DIAGNOSIS — R809 Proteinuria, unspecified: Secondary | ICD-10-CM | POA: Diagnosis not present

## 2020-12-14 DIAGNOSIS — J9601 Acute respiratory failure with hypoxia: Secondary | ICD-10-CM | POA: Diagnosis not present

## 2020-12-14 DIAGNOSIS — I700 Atherosclerosis of aorta: Secondary | ICD-10-CM | POA: Diagnosis not present

## 2020-12-14 DIAGNOSIS — I5031 Acute diastolic (congestive) heart failure: Secondary | ICD-10-CM | POA: Diagnosis not present

## 2020-12-14 DIAGNOSIS — I110 Hypertensive heart disease with heart failure: Secondary | ICD-10-CM | POA: Diagnosis not present

## 2020-12-14 DIAGNOSIS — J45901 Unspecified asthma with (acute) exacerbation: Secondary | ICD-10-CM | POA: Diagnosis not present

## 2020-12-14 DIAGNOSIS — M1711 Unilateral primary osteoarthritis, right knee: Secondary | ICD-10-CM | POA: Diagnosis not present

## 2020-12-19 DIAGNOSIS — M1711 Unilateral primary osteoarthritis, right knee: Secondary | ICD-10-CM | POA: Diagnosis not present

## 2020-12-19 DIAGNOSIS — I5031 Acute diastolic (congestive) heart failure: Secondary | ICD-10-CM | POA: Diagnosis not present

## 2020-12-19 DIAGNOSIS — J45901 Unspecified asthma with (acute) exacerbation: Secondary | ICD-10-CM | POA: Diagnosis not present

## 2020-12-19 DIAGNOSIS — I700 Atherosclerosis of aorta: Secondary | ICD-10-CM | POA: Diagnosis not present

## 2020-12-19 DIAGNOSIS — J9601 Acute respiratory failure with hypoxia: Secondary | ICD-10-CM | POA: Diagnosis not present

## 2020-12-19 DIAGNOSIS — I110 Hypertensive heart disease with heart failure: Secondary | ICD-10-CM | POA: Diagnosis not present

## 2020-12-21 DIAGNOSIS — M1711 Unilateral primary osteoarthritis, right knee: Secondary | ICD-10-CM | POA: Diagnosis not present

## 2020-12-21 DIAGNOSIS — J9601 Acute respiratory failure with hypoxia: Secondary | ICD-10-CM | POA: Diagnosis not present

## 2020-12-21 DIAGNOSIS — J45901 Unspecified asthma with (acute) exacerbation: Secondary | ICD-10-CM | POA: Diagnosis not present

## 2020-12-21 DIAGNOSIS — I110 Hypertensive heart disease with heart failure: Secondary | ICD-10-CM | POA: Diagnosis not present

## 2020-12-21 DIAGNOSIS — I700 Atherosclerosis of aorta: Secondary | ICD-10-CM | POA: Diagnosis not present

## 2020-12-21 DIAGNOSIS — I5031 Acute diastolic (congestive) heart failure: Secondary | ICD-10-CM | POA: Diagnosis not present

## 2020-12-25 ENCOUNTER — Telehealth: Payer: Self-pay | Admitting: Family Medicine

## 2020-12-25 DIAGNOSIS — M1711 Unilateral primary osteoarthritis, right knee: Secondary | ICD-10-CM | POA: Diagnosis not present

## 2020-12-25 DIAGNOSIS — F411 Generalized anxiety disorder: Secondary | ICD-10-CM | POA: Diagnosis not present

## 2020-12-25 DIAGNOSIS — J45901 Unspecified asthma with (acute) exacerbation: Secondary | ICD-10-CM | POA: Diagnosis not present

## 2020-12-25 DIAGNOSIS — J9601 Acute respiratory failure with hypoxia: Secondary | ICD-10-CM | POA: Diagnosis not present

## 2020-12-25 DIAGNOSIS — I700 Atherosclerosis of aorta: Secondary | ICD-10-CM | POA: Diagnosis not present

## 2020-12-25 DIAGNOSIS — I110 Hypertensive heart disease with heart failure: Secondary | ICD-10-CM | POA: Diagnosis not present

## 2020-12-25 DIAGNOSIS — F331 Major depressive disorder, recurrent, moderate: Secondary | ICD-10-CM | POA: Diagnosis not present

## 2020-12-25 DIAGNOSIS — I5031 Acute diastolic (congestive) heart failure: Secondary | ICD-10-CM | POA: Diagnosis not present

## 2020-12-25 MED ORDER — LOSARTAN POTASSIUM 25 MG PO TABS: 25.0000 mg | ORAL_TABLET | Freq: Every day | ORAL | 0 refills | Status: DC

## 2020-12-25 NOTE — Telephone Encounter (Signed)
[facility] faxed refill request for the following medications:   losartan (COZAAR) 25 MG tablet   Please advise.

## 2020-12-26 DIAGNOSIS — F419 Anxiety disorder, unspecified: Secondary | ICD-10-CM | POA: Diagnosis not present

## 2020-12-26 DIAGNOSIS — E538 Deficiency of other specified B group vitamins: Secondary | ICD-10-CM | POA: Diagnosis not present

## 2020-12-26 DIAGNOSIS — I5031 Acute diastolic (congestive) heart failure: Secondary | ICD-10-CM | POA: Diagnosis not present

## 2020-12-26 DIAGNOSIS — S6291XD Unspecified fracture of right wrist and hand, subsequent encounter for fracture with routine healing: Secondary | ICD-10-CM | POA: Diagnosis not present

## 2020-12-26 DIAGNOSIS — F32A Depression, unspecified: Secondary | ICD-10-CM | POA: Diagnosis not present

## 2020-12-26 DIAGNOSIS — E039 Hypothyroidism, unspecified: Secondary | ICD-10-CM | POA: Diagnosis not present

## 2020-12-26 DIAGNOSIS — Z7982 Long term (current) use of aspirin: Secondary | ICD-10-CM | POA: Diagnosis not present

## 2020-12-26 DIAGNOSIS — J45901 Unspecified asthma with (acute) exacerbation: Secondary | ICD-10-CM | POA: Diagnosis not present

## 2020-12-26 DIAGNOSIS — M1711 Unilateral primary osteoarthritis, right knee: Secondary | ICD-10-CM | POA: Diagnosis not present

## 2020-12-26 DIAGNOSIS — E785 Hyperlipidemia, unspecified: Secondary | ICD-10-CM | POA: Diagnosis not present

## 2020-12-26 DIAGNOSIS — J9601 Acute respiratory failure with hypoxia: Secondary | ICD-10-CM | POA: Diagnosis not present

## 2020-12-26 DIAGNOSIS — Z7951 Long term (current) use of inhaled steroids: Secondary | ICD-10-CM | POA: Diagnosis not present

## 2020-12-26 DIAGNOSIS — I700 Atherosclerosis of aorta: Secondary | ICD-10-CM | POA: Diagnosis not present

## 2020-12-26 DIAGNOSIS — Z9181 History of falling: Secondary | ICD-10-CM | POA: Diagnosis not present

## 2020-12-26 DIAGNOSIS — E559 Vitamin D deficiency, unspecified: Secondary | ICD-10-CM | POA: Diagnosis not present

## 2020-12-26 DIAGNOSIS — I110 Hypertensive heart disease with heart failure: Secondary | ICD-10-CM | POA: Diagnosis not present

## 2020-12-28 DIAGNOSIS — I110 Hypertensive heart disease with heart failure: Secondary | ICD-10-CM | POA: Diagnosis not present

## 2020-12-28 DIAGNOSIS — I700 Atherosclerosis of aorta: Secondary | ICD-10-CM | POA: Diagnosis not present

## 2020-12-28 DIAGNOSIS — M1711 Unilateral primary osteoarthritis, right knee: Secondary | ICD-10-CM | POA: Diagnosis not present

## 2020-12-28 DIAGNOSIS — J9601 Acute respiratory failure with hypoxia: Secondary | ICD-10-CM | POA: Diagnosis not present

## 2020-12-28 DIAGNOSIS — J45901 Unspecified asthma with (acute) exacerbation: Secondary | ICD-10-CM | POA: Diagnosis not present

## 2020-12-28 DIAGNOSIS — I5031 Acute diastolic (congestive) heart failure: Secondary | ICD-10-CM | POA: Diagnosis not present

## 2021-01-03 DIAGNOSIS — I700 Atherosclerosis of aorta: Secondary | ICD-10-CM | POA: Diagnosis not present

## 2021-01-03 DIAGNOSIS — J45901 Unspecified asthma with (acute) exacerbation: Secondary | ICD-10-CM | POA: Diagnosis not present

## 2021-01-03 DIAGNOSIS — J9601 Acute respiratory failure with hypoxia: Secondary | ICD-10-CM | POA: Diagnosis not present

## 2021-01-03 DIAGNOSIS — M1711 Unilateral primary osteoarthritis, right knee: Secondary | ICD-10-CM | POA: Diagnosis not present

## 2021-01-03 DIAGNOSIS — I110 Hypertensive heart disease with heart failure: Secondary | ICD-10-CM | POA: Diagnosis not present

## 2021-01-03 DIAGNOSIS — I5031 Acute diastolic (congestive) heart failure: Secondary | ICD-10-CM | POA: Diagnosis not present

## 2021-01-05 DIAGNOSIS — F331 Major depressive disorder, recurrent, moderate: Secondary | ICD-10-CM | POA: Diagnosis not present

## 2021-01-05 DIAGNOSIS — F411 Generalized anxiety disorder: Secondary | ICD-10-CM | POA: Diagnosis not present

## 2021-01-05 DIAGNOSIS — F5105 Insomnia due to other mental disorder: Secondary | ICD-10-CM | POA: Diagnosis not present

## 2021-01-09 DIAGNOSIS — I700 Atherosclerosis of aorta: Secondary | ICD-10-CM | POA: Diagnosis not present

## 2021-01-09 DIAGNOSIS — I110 Hypertensive heart disease with heart failure: Secondary | ICD-10-CM | POA: Diagnosis not present

## 2021-01-09 DIAGNOSIS — M1711 Unilateral primary osteoarthritis, right knee: Secondary | ICD-10-CM | POA: Diagnosis not present

## 2021-01-09 DIAGNOSIS — J9601 Acute respiratory failure with hypoxia: Secondary | ICD-10-CM | POA: Diagnosis not present

## 2021-01-09 DIAGNOSIS — I5031 Acute diastolic (congestive) heart failure: Secondary | ICD-10-CM | POA: Diagnosis not present

## 2021-01-09 DIAGNOSIS — J45901 Unspecified asthma with (acute) exacerbation: Secondary | ICD-10-CM | POA: Diagnosis not present

## 2021-01-11 DIAGNOSIS — R269 Unspecified abnormalities of gait and mobility: Secondary | ICD-10-CM | POA: Diagnosis not present

## 2021-01-11 DIAGNOSIS — E039 Hypothyroidism, unspecified: Secondary | ICD-10-CM | POA: Diagnosis not present

## 2021-01-11 DIAGNOSIS — F411 Generalized anxiety disorder: Secondary | ICD-10-CM | POA: Diagnosis not present

## 2021-01-11 DIAGNOSIS — I5040 Unspecified combined systolic (congestive) and diastolic (congestive) heart failure: Secondary | ICD-10-CM | POA: Diagnosis not present

## 2021-01-11 DIAGNOSIS — F330 Major depressive disorder, recurrent, mild: Secondary | ICD-10-CM | POA: Diagnosis not present

## 2021-01-11 DIAGNOSIS — I130 Hypertensive heart and chronic kidney disease with heart failure and stage 1 through stage 4 chronic kidney disease, or unspecified chronic kidney disease: Secondary | ICD-10-CM | POA: Diagnosis not present

## 2021-01-11 DIAGNOSIS — Z993 Dependence on wheelchair: Secondary | ICD-10-CM | POA: Diagnosis not present

## 2021-01-11 DIAGNOSIS — E785 Hyperlipidemia, unspecified: Secondary | ICD-10-CM | POA: Diagnosis not present

## 2021-01-11 DIAGNOSIS — N1831 Chronic kidney disease, stage 3a: Secondary | ICD-10-CM | POA: Diagnosis not present

## 2021-01-15 DIAGNOSIS — J9601 Acute respiratory failure with hypoxia: Secondary | ICD-10-CM | POA: Diagnosis not present

## 2021-01-15 DIAGNOSIS — J45901 Unspecified asthma with (acute) exacerbation: Secondary | ICD-10-CM | POA: Diagnosis not present

## 2021-01-15 DIAGNOSIS — M1711 Unilateral primary osteoarthritis, right knee: Secondary | ICD-10-CM | POA: Diagnosis not present

## 2021-01-15 DIAGNOSIS — I110 Hypertensive heart disease with heart failure: Secondary | ICD-10-CM | POA: Diagnosis not present

## 2021-01-15 DIAGNOSIS — I5031 Acute diastolic (congestive) heart failure: Secondary | ICD-10-CM | POA: Diagnosis not present

## 2021-01-15 DIAGNOSIS — I700 Atherosclerosis of aorta: Secondary | ICD-10-CM | POA: Diagnosis not present

## 2021-01-23 DIAGNOSIS — M1711 Unilateral primary osteoarthritis, right knee: Secondary | ICD-10-CM | POA: Diagnosis not present

## 2021-01-23 DIAGNOSIS — I5031 Acute diastolic (congestive) heart failure: Secondary | ICD-10-CM | POA: Diagnosis not present

## 2021-01-23 DIAGNOSIS — I700 Atherosclerosis of aorta: Secondary | ICD-10-CM | POA: Diagnosis not present

## 2021-01-23 DIAGNOSIS — J45901 Unspecified asthma with (acute) exacerbation: Secondary | ICD-10-CM | POA: Diagnosis not present

## 2021-01-23 DIAGNOSIS — J9601 Acute respiratory failure with hypoxia: Secondary | ICD-10-CM | POA: Diagnosis not present

## 2021-01-23 DIAGNOSIS — I110 Hypertensive heart disease with heart failure: Secondary | ICD-10-CM | POA: Diagnosis not present

## 2021-02-02 DIAGNOSIS — Z23 Encounter for immunization: Secondary | ICD-10-CM | POA: Diagnosis not present

## 2021-02-05 ENCOUNTER — Telehealth: Payer: Self-pay | Admitting: Family Medicine

## 2021-02-05 NOTE — Telephone Encounter (Signed)
Pt is calling to request an appt for approval to be discharged from [facility]  Pt is wanting to be worked into scheduled.Pt has competed PT. And is need a Dr. Montine Circle to approve when the she can go home. CB- [phone]

## 2021-02-07 DIAGNOSIS — F411 Generalized anxiety disorder: Secondary | ICD-10-CM | POA: Diagnosis not present

## 2021-02-07 DIAGNOSIS — F5105 Insomnia due to other mental disorder: Secondary | ICD-10-CM | POA: Diagnosis not present

## 2021-02-07 DIAGNOSIS — F331 Major depressive disorder, recurrent, moderate: Secondary | ICD-10-CM | POA: Diagnosis not present

## 2021-02-12 ENCOUNTER — Telehealth: Payer: Self-pay | Admitting: *Deleted

## 2021-02-12 ENCOUNTER — Encounter: Payer: Self-pay | Admitting: Physician Assistant

## 2021-02-12 ENCOUNTER — Other Ambulatory Visit: Payer: Self-pay

## 2021-02-12 ENCOUNTER — Ambulatory Visit (INDEPENDENT_AMBULATORY_CARE_PROVIDER_SITE_OTHER): Payer: Medicare Other | Admitting: Physician Assistant

## 2021-02-12 ENCOUNTER — Telehealth: Payer: Self-pay

## 2021-02-12 VITALS — BP 82/54 | HR 61 | Ht 69.0 in

## 2021-02-12 DIAGNOSIS — I959 Hypotension, unspecified: Secondary | ICD-10-CM

## 2021-02-12 DIAGNOSIS — N183 Chronic kidney disease, stage 3 unspecified: Secondary | ICD-10-CM

## 2021-02-12 DIAGNOSIS — D649 Anemia, unspecified: Secondary | ICD-10-CM | POA: Diagnosis not present

## 2021-02-12 DIAGNOSIS — E039 Hypothyroidism, unspecified: Secondary | ICD-10-CM

## 2021-02-12 DIAGNOSIS — R739 Hyperglycemia, unspecified: Secondary | ICD-10-CM | POA: Insufficient documentation

## 2021-02-12 DIAGNOSIS — N1831 Chronic kidney disease, stage 3a: Secondary | ICD-10-CM | POA: Diagnosis not present

## 2021-02-12 DIAGNOSIS — R296 Repeated falls: Secondary | ICD-10-CM | POA: Diagnosis not present

## 2021-02-12 DIAGNOSIS — I872 Venous insufficiency (chronic) (peripheral): Secondary | ICD-10-CM | POA: Diagnosis not present

## 2021-02-12 DIAGNOSIS — I5040 Unspecified combined systolic (congestive) and diastolic (congestive) heart failure: Secondary | ICD-10-CM | POA: Diagnosis not present

## 2021-02-12 DIAGNOSIS — F411 Generalized anxiety disorder: Secondary | ICD-10-CM | POA: Diagnosis not present

## 2021-02-12 DIAGNOSIS — F330 Major depressive disorder, recurrent, mild: Secondary | ICD-10-CM | POA: Diagnosis not present

## 2021-02-12 DIAGNOSIS — I130 Hypertensive heart and chronic kidney disease with heart failure and stage 1 through stage 4 chronic kidney disease, or unspecified chronic kidney disease: Secondary | ICD-10-CM | POA: Diagnosis not present

## 2021-02-12 DIAGNOSIS — I10 Essential (primary) hypertension: Secondary | ICD-10-CM

## 2021-02-12 NOTE — Addendum Note (Signed)
Addended byMikey Kirschner on: 02/12/2021 05:01 PM   Modules accepted: Orders

## 2021-02-12 NOTE — Chronic Care Management (AMB) (Signed)
  Chronic Care Management   Note  02/12/2021 Name: LAMARI YOUNGERS MRN: [id number] DOB: May 24, 1945  PAMELLA SAMONS is a 75 y.o. year old female who is a primary care patient of Brita Romp, Dionne Bucy, MD. I reached out to Claiborne Billings by phone today in response to a referral sent by Ms. Eustace Quail Palazzola's PCP.  Ms. Riling was given information about Chronic Care Management services today including:  CCM service includes personalized support from designated clinical staff supervised by her physician, including individualized plan of care and coordination with other care providers 24/7 contact phone numbers for assistance for urgent and routine care needs. Service will only be billed when office clinical staff spend 20 minutes or more in a month to coordinate care. Only one practitioner may furnish and bill the service in a calendar month. The patient may stop CCM services at any time (effective at the end of the month) by phone call to the office staff. The patient is responsible for co-pay (up to 20% after annual deductible is met) if co-pay is required by the individual health plan.   Patient agreed to services and verbal consent obtained.   Follow up plan: Telephone appointment with care management team member scheduled for: 02/13/2021  Julian Hy, [place] [facility]  Direct Dial: [phone]

## 2021-02-12 NOTE — Assessment & Plan Note (Signed)
Hgb ~9 last ED visit, undetermined etiology. Will check CBC and iron panel.

## 2021-02-12 NOTE — Assessment & Plan Note (Signed)
Historically, will check CMP and A1c today.

## 2021-02-12 NOTE — Progress Notes (Addendum)
Able to get medications from transport when they came to pick her up: Current BP meds: Amlodipine 10 mg POQD Lasix 20 mg take 0.5 tab once daily Losartan 100 mg POQD Labetolol 200 mg POBID  Other medications: ASA 81 mg 1 tab Bupropion 100 mg POQD Levothyroxine 100 mg POQD Melatonin 10 mg POQD Kcl 10 MEq 2 tab daily Pravastatin 20 mg POQD Seroquel 100 mg 2 tab POQD Trazodone 50 mg 1/2 tab poQD Albuterol inhaler prn  Pt unaware how many meds she is taking. Seems to be a miscommunication between nephrologist and assisted living as well as many of these meds were listed as d/c. My recommendation is to stay in assisted living until medications are sorted, she is no longer dizzy, and BP becomes normal. Currently high fall risk.   Today I want to d/c amlodipine and f/u again in 1 month, we will change follow up appointment. Would be ideal if we had BP measurements from facility.    Established patient visit   Patient: Wanda Ochoa   DOB: 22-Nov-1945   75 y.o. Female  MRN: [id number] Visit Date: 02/12/2021  Today's healthcare provider: Mikey Kirschner, PA-C   Cc. Discussion of transition of care  Subjective    HPI  Wanda Ochoa is a 75 y/o female who presents today from [facility] in [place] to discuss a transition to living unassisted at home.   She reports losing her husband in March of this year and feels all of her symptoms started at that point.  She has a history of catatonic depression which primarily presented itself physically in the past.  History of ECT and has been on Seroquel for several years which she reports successfully managed with her depression. Denies current depressive thoughts, SI. The last 6 months she has been experiencing dizziness and frequent falls, several emergency room visits since the summer.  She [facility] until [facility] to [facility] living in August.  She has been  at [place] since but has felt much better and would like to discuss the transition to home.  She previously lived with her son, 110 years old and will be moving back to live with him. She feels she is able to control her own medications, cook for herself, and start driving again. She reports an extensive support network of family and friends.  She attributes her dizziness and falls to her grief reaction to losing her husband.  Reports no falls since August 2022.  She has reported her blood pressure has been trending downwards and today in the office she felt dizzy after walking up the stairs.  Reports not feeling dizzy at all recently last episode that sent her to the emergency room on 7/22.  She follows with a nephrologist and neurologist, unable to see these notes today.  She denies current CP, SOB. Reports some numbness in her feet, for years. Is able to balance and feel the ground.   Medications: Outpatient Medications Prior to Visit  Medication Sig   albuterol (VENTOLIN HFA) 108 (90 Base) MCG/ACT inhaler Inhale 2 puffs into the lungs every 4 (four) hours as needed for wheezing or shortness of breath. INHALE 2 PUFFS INTO THE LUNGS EVERY 6 HOURS AS NEEDED   aspirin 81 MG tablet Take 81 mg by mouth daily.   citalopram (CELEXA) 10 MG tablet Take 10 mg by mouth daily.   cyanocobalamin (,VITAMIN B-12,) 1000 MCG/ML injection Inject  1 mL (1,000 mcg total) into the muscle once a week. Once a week for 3 weeks and Than Once Monthly   fluticasone-salmeterol (ADVAIR HFA) 115-21 MCG/ACT inhaler Inhale 2 puffs into the lungs 2 (two) times daily.   hydrOXYzine (VISTARIL) 25 MG capsule Take 25 mg by mouth daily as needed for anxiety.   labetalol (NORMODYNE) 200 MG tablet Take 1 tablet (200 mg total) by mouth 2 (two) times daily.   levothyroxine (SYNTHROID) 150 MCG tablet Take 1 tablet (150 mcg total) by mouth daily.   losartan (COZAAR) 25 MG tablet Take 1 tablet (25 mg total) by mouth daily. Please schedule  office visit before any future refills.   nortriptyline (PAMELOR) 25 MG capsule Take 3 capsule at bed time (Patient taking differently: Take 75 mg by mouth at bedtime.)   polyethylene glycol powder (GLYCOLAX/MIRALAX) 17 GM/SCOOP powder Take 17 g by mouth daily.   pravastatin (PRAVACHOL) 20 MG tablet Take 20 mg by mouth daily.   QUEtiapine (SEROQUEL) 100 MG tablet Take 100 mg by mouth at bedtime.   Syringe/Needle, Disp, (SYRINGE 3CC/25GX1") 25G X 1" 3 ML MISC 1 each by Does not apply route every 30 (thirty) days.   traZODone (DESYREL) 50 MG tablet Take 25 mg by mouth at bedtime as needed.   vitamin B-12 (CYANOCOBALAMIN) 1000 MCG tablet Take 1 tablet (1,000 mcg total) by mouth daily.   amLODipine (NORVASC) 5 MG tablet Take 1 tablet (5 mg total) by mouth daily.   furosemide (LASIX) 40 MG tablet Take 1 tablet (40 mg total) by mouth daily.   potassium chloride (KLOR-CON 10) 10 MEQ tablet Take 2 tablets (20 mEq total) by mouth daily.   No facility-administered medications prior to visit.    Review of Systems  Constitutional:  Negative for fatigue and fever.  Respiratory:  Negative for cough, chest tightness and shortness of breath.   Cardiovascular:  Positive for leg swelling. Negative for chest pain.  Neurological:  Positive for dizziness.  Psychiatric/Behavioral:  Negative for suicidal ideas.   All other systems reviewed and are negative.  Last CBC Lab Results  Component Value Date   WBC 5.1 11/08/2020   HGB 9.3 (L) 11/08/2020   HCT 28.3 (L) 11/08/2020   MCV 98.6 11/08/2020   MCH 32.4 11/08/2020   RDW 13.2 11/08/2020   PLT 206 54/65/0354   Last metabolic panel Lab Results  Component Value Date   GLUCOSE 122 (H) 11/08/2020   NA 137 11/08/2020   K 3.4 (L) 11/08/2020   CL 104 11/08/2020   CO2 26 11/08/2020   BUN 13 11/08/2020   CREATININE 1.09 (H) 11/08/2020   GFRNONAA 53 (L) 11/08/2020   CALCIUM 8.5 (L) 11/08/2020   PHOS 3.3 09/26/2020   PROT 6.9 10/10/2020   ALBUMIN 3.7  10/10/2020   LABGLOB 2.9 09/29/2020   AGRATIO 1.3 09/29/2020   BILITOT 0.9 10/10/2020   ALKPHOS 79 10/10/2020   AST 28 10/10/2020   ALT 12 10/10/2020   ANIONGAP 7 11/08/2020   Last lipids Lab Results  Component Value Date   CHOL 145 08/30/2020   HDL 55 08/30/2020   LDLCALC 72 08/30/2020   TRIG 88 08/30/2020   CHOLHDL 2.6 08/30/2020   Last hemoglobin A1c Lab Results  Component Value Date   HGBA1C 5.2 08/30/2020   Last thyroid functions Lab Results  Component Value Date   TSH 8.265 (H) 10/10/2020   Last vitamin D Lab Results  Component Value Date   VD25OH 19.14 (L) 09/24/2020  Last vitamin B12 and Folate Lab Results  Component Value Date   VITAMINB12 107 (L) 08/30/2020       Objective    BP (!) 80/50   Pulse 61   Ht 5\' 9"  (1.753 m)   BMI 24.42 kg/m  BP Readings from Last 3 Encounters:  02/12/21 (!) 80/50  11/08/20 133/70  10/10/20 (!) 146/78   Wt Readings from Last 3 Encounters:  11/08/20 165 lb 5.5 oz (75 kg)  10/10/20 165 lb (74.8 kg)  10/02/20 169 lb 8.5 oz (76.9 kg)      Physical Exam Constitutional:      General: She is awake.     Appearance: She is well-developed.  HENT:     Head: Normocephalic.  Eyes:     Conjunctiva/sclera: Conjunctivae normal.  Cardiovascular:     Rate and Rhythm: Normal rate and regular rhythm.     Pulses:          Dorsalis pedis pulses are 3+ on the right side and 3+ on the left side.     Heart sounds: Normal heart sounds.  Pulmonary:     Effort: Pulmonary effort is normal.     Breath sounds: Normal breath sounds.  Musculoskeletal:     Left lower leg: Edema present.     Comments: Small 1-3 mm crusted ulcerations along bilateral shins.   Feet:     Right foot:     Protective Sensation: 3 sites tested.  3 sites sensed.     Skin integrity: Dry skin present.     Left foot:     Protective Sensation: 3 sites tested.  3 sites sensed.     Skin integrity: Dry skin present.  Skin:    General: Skin is warm.   Neurological:     Mental Status: She is alert and oriented to person, place, and time.  Psychiatric:        Attention and Perception: Attention normal.        Mood and Affect: Mood normal.        Speech: Speech normal.        Behavior: Behavior is cooperative.     No results found for any visits on 02/12/21.  Assessment & Plan     Problem List Items Addressed This Visit       Cardiovascular and Mediastinum   Benign hypertension   Venous insufficiency of both lower extremities   Hypotension - Primary    In office today  80/50, repeat 82/54. Dizziness after walking up stairs. Uses walker.  Unable to tell what hypertensive medications she is actually taking. Ref to CCM to coordinate with medical team at [facility] to see what meds she is taking and what changes need to be made.       Relevant Orders   AMB Referral to [facility] Coordinaton     Genitourinary   Stage 3 chronic kidney disease ([place])    Will recheck CMP Follows with nephrologist, unable to see records.      Relevant Orders   Comprehensive Metabolic Panel (CMET)     Other   Frequent falls    Per pt no falls since 10/2020.  At this time not appropriate to leave assisted living until plan of care is made and dizziness is under control.  Will discuss options with supervising physician.        Hyperglycemia    Historically, will check CMP and A1c today.      Relevant Orders   Comprehensive Metabolic  Panel (CMET)   HgB A1c   Anemia    Hgb ~9 last ED visit, undetermined etiology. Will check CBC and iron panel.       Relevant Orders   CBC w/Diff/Platelet   Iron, TIBC and Ferritin Panel     Return in about 2 months (around 04/14/2021) for hypertension, home vs facility care.      I, Mikey Kirschner, PA-C have reviewed all documentation for this visit. The documentation on  02/12/2021 for the exam, diagnosis, procedures, and orders are all accurate and complete.    Mikey Kirschner, PA-C   [facility] [phone] (phone) [phone] (fax)  [place]

## 2021-02-12 NOTE — Assessment & Plan Note (Signed)
Per pt no falls since 10/2020.  At this time not appropriate to leave assisted living until plan of care is made and dizziness is under control.  Will discuss options with supervising physician.

## 2021-02-12 NOTE — Telephone Encounter (Signed)
Copied from [place] [id number]. Topic: General - Other >> Feb 12, 2021  3:15 PM Celene Kras wrote: Reason for CRM: Pt called stating that [facility] employee will be bringing by paperwork and wheelchair to office Today. Please contact pt once received. Please advise.

## 2021-02-12 NOTE — Assessment & Plan Note (Signed)
Will recheck CMP Follows with nephrologist, unable to see records.

## 2021-02-12 NOTE — Assessment & Plan Note (Signed)
In office today  80/50, repeat 82/54. Dizziness after walking up stairs. Uses walker.  Unable to tell what hypertensive medications she is actually taking. Ref to CCM to coordinate with medical team at [facility] to see what meds she is taking and what changes need to be made.

## 2021-02-13 ENCOUNTER — Ambulatory Visit: Payer: BLUE CROSS/BLUE SHIELD

## 2021-02-13 DIAGNOSIS — I5031 Acute diastolic (congestive) heart failure: Secondary | ICD-10-CM

## 2021-02-13 DIAGNOSIS — E039 Hypothyroidism, unspecified: Secondary | ICD-10-CM

## 2021-02-13 DIAGNOSIS — J45909 Unspecified asthma, uncomplicated: Secondary | ICD-10-CM

## 2021-02-13 LAB — CBC WITH DIFFERENTIAL/PLATELET
Basophils Absolute: 0 10*3/uL (ref 0.0–0.2)
Basos: 1 %
EOS (ABSOLUTE): 0.2 10*3/uL (ref 0.0–0.4)
Eos: 3 %
Hematocrit: 33.5 % — ABNORMAL LOW (ref 34.0–46.6)
Hemoglobin: 11.3 g/dL (ref 11.1–15.9)
Immature Grans (Abs): 0 10*3/uL (ref 0.0–0.1)
Immature Granulocytes: 0 %
Lymphocytes Absolute: 1.9 10*3/uL (ref 0.7–3.1)
Lymphs: 35 %
MCH: 31 pg (ref 26.6–33.0)
MCHC: 33.7 g/dL (ref 31.5–35.7)
MCV: 92 fL (ref 79–97)
Monocytes Absolute: 0.6 10*3/uL (ref 0.1–0.9)
Monocytes: 10 %
Neutrophils Absolute: 2.9 10*3/uL (ref 1.4–7.0)
Neutrophils: 51 %
Platelets: 210 10*3/uL (ref 150–450)
RBC: 3.65 x10E6/uL — ABNORMAL LOW (ref 3.77–5.28)
RDW: 12.3 % (ref 11.7–15.4)
WBC: 5.6 10*3/uL (ref 3.4–10.8)

## 2021-02-13 LAB — IRON,TIBC AND FERRITIN PANEL
Ferritin: 123 ng/mL (ref 15–150)
Iron Saturation: 35 % (ref 15–55)
Iron: 93 ug/dL (ref 27–139)
Total Iron Binding Capacity: 263 ug/dL (ref 250–450)
UIBC: 170 ug/dL (ref 118–369)

## 2021-02-13 LAB — COMPREHENSIVE METABOLIC PANEL
ALT: 19 IU/L (ref 0–32)
AST: 23 IU/L (ref 0–40)
Albumin/Globulin Ratio: 1.7 (ref 1.2–2.2)
Albumin: 4.3 g/dL (ref 3.7–4.7)
Alkaline Phosphatase: 174 IU/L — ABNORMAL HIGH (ref 44–121)
BUN/Creatinine Ratio: 14 (ref 12–28)
BUN: 22 mg/dL (ref 8–27)
Bilirubin Total: 0.4 mg/dL (ref 0.0–1.2)
CO2: 27 mmol/L (ref 20–29)
Calcium: 9.3 mg/dL (ref 8.7–10.3)
Chloride: 100 mmol/L (ref 96–106)
Creatinine, Ser: 1.59 mg/dL — ABNORMAL HIGH (ref 0.57–1.00)
Globulin, Total: 2.6 g/dL (ref 1.5–4.5)
Glucose: 111 mg/dL — ABNORMAL HIGH (ref 70–99)
Potassium: 5.1 mmol/L (ref 3.5–5.2)
Sodium: 138 mmol/L (ref 134–144)
Total Protein: 6.9 g/dL (ref 6.0–8.5)
eGFR: 34 mL/min/{1.73_m2} — ABNORMAL LOW (ref >59)

## 2021-02-13 LAB — HEMOGLOBIN A1C
Est. average glucose Bld gHb Est-mCnc: 105 mg/dL
Hgb A1c MFr Bld: 5.3 % (ref 4.8–5.6)

## 2021-02-13 IMAGING — MG DIGITAL SCREENING BILATERAL MAMMOGRAM WITH TOMO AND CAD
8 series · 8 of 24 positions shown · non-contrast
Comparison: Previous exam(s).

CLINICAL DATA: Screening.

EXAM:
DIGITAL SCREENING BILATERAL MAMMOGRAM WITH TOMO AND CAD

[R MLO synth-2D]
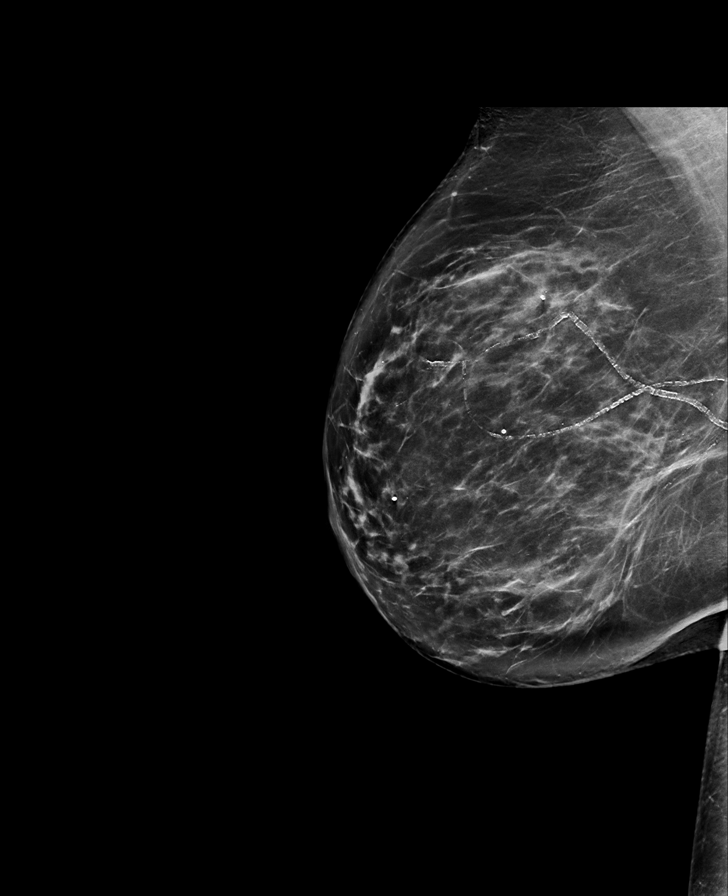

[R CC synth-2D]
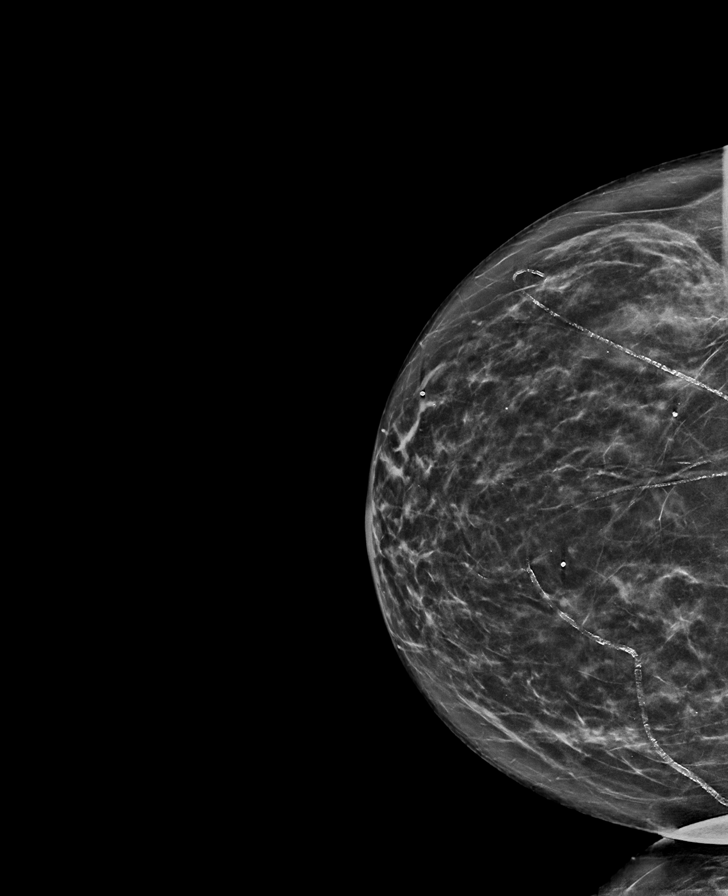

[L MLO synth-2D]
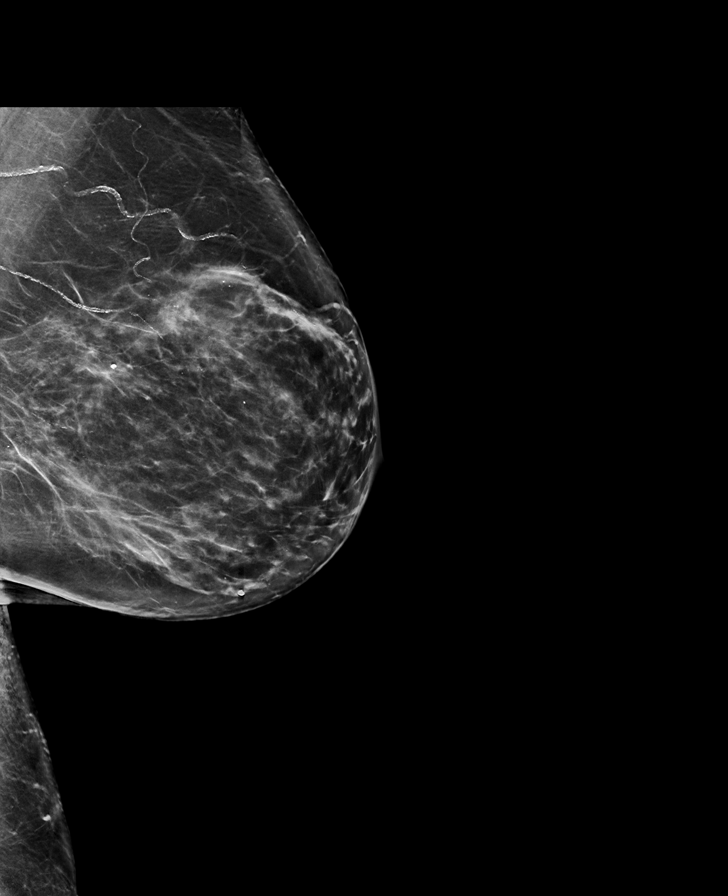

[L CC synth-2D]
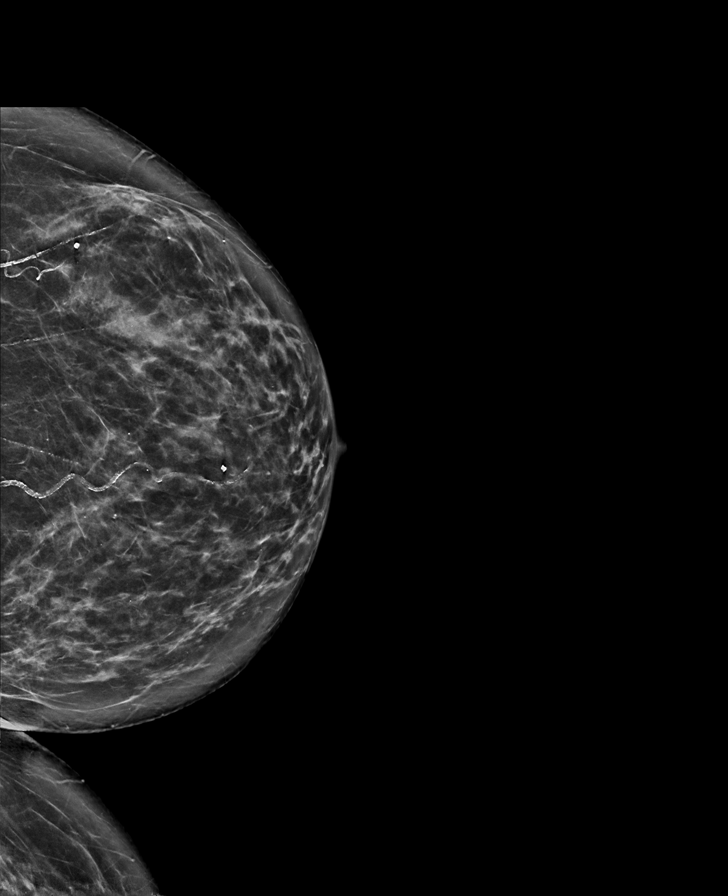

[L CC tomo · tomo slice 37/74.0]
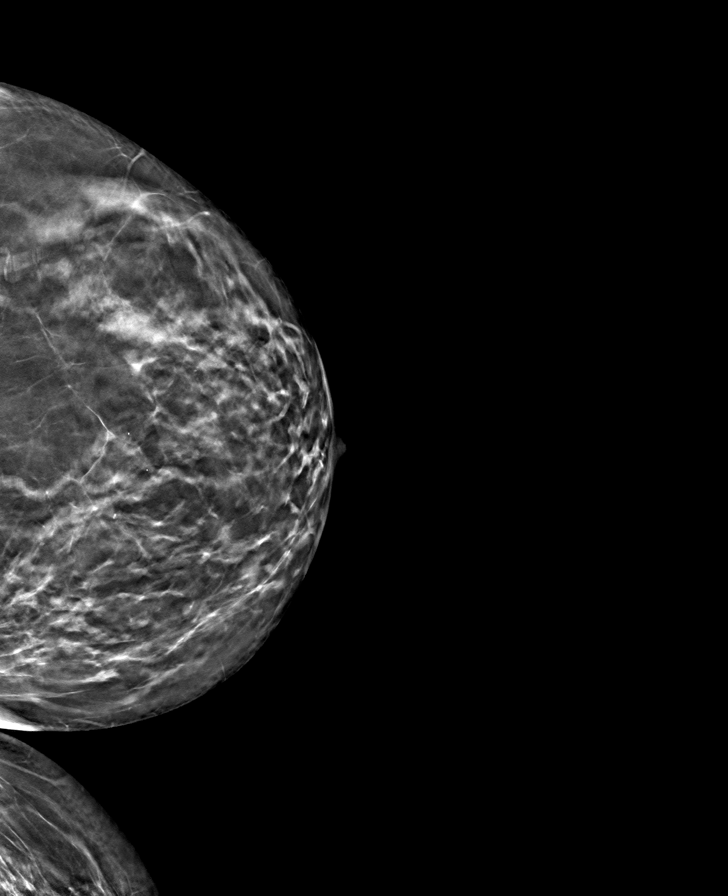

[L MLO tomo · tomo slice 42/83.0]
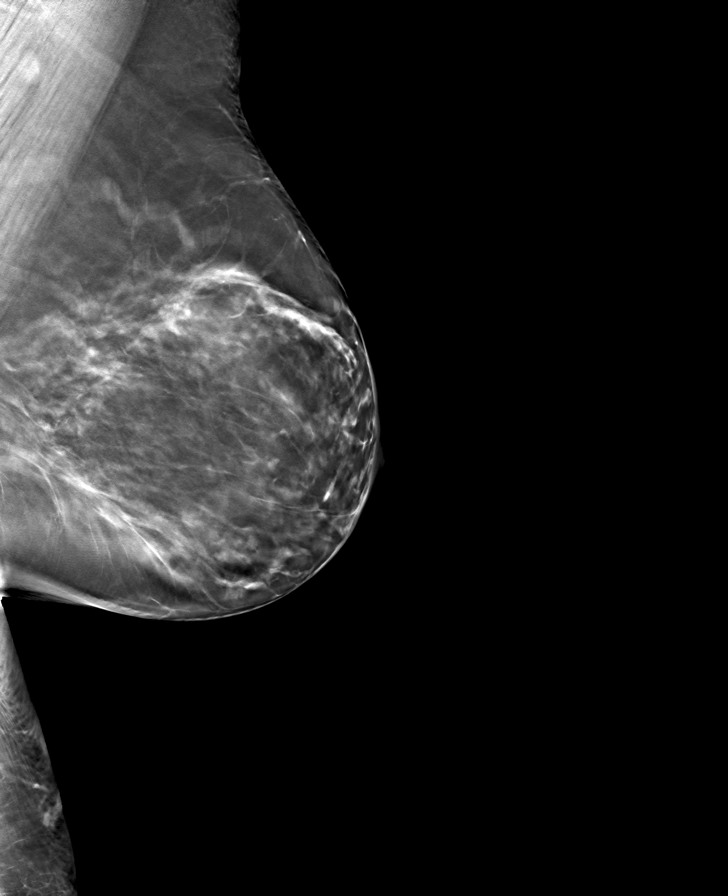

[R CC tomo · tomo slice 35/68.0]
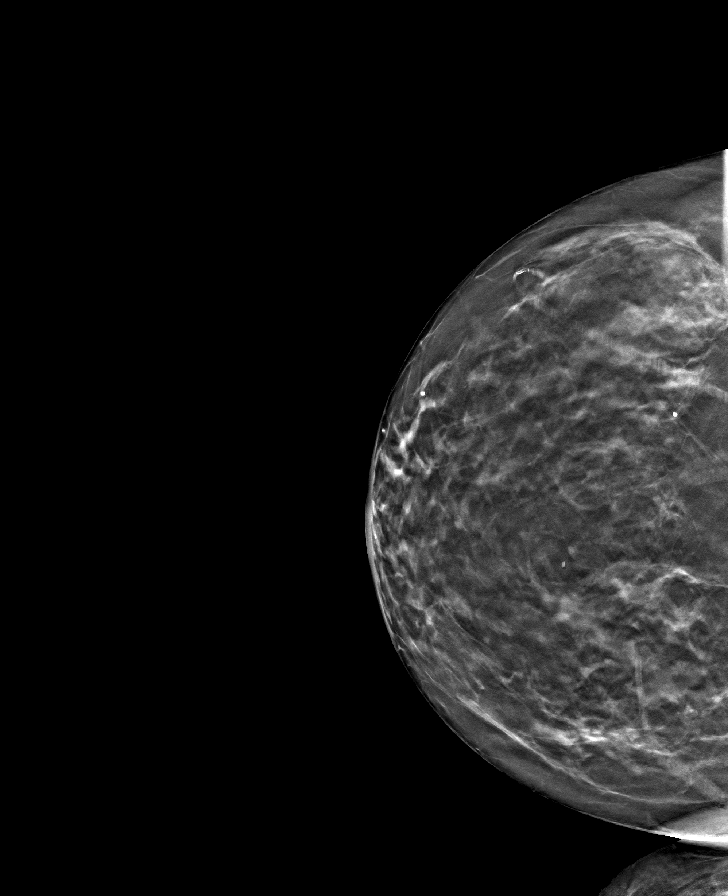

[R MLO tomo · tomo slice 43/84.0]
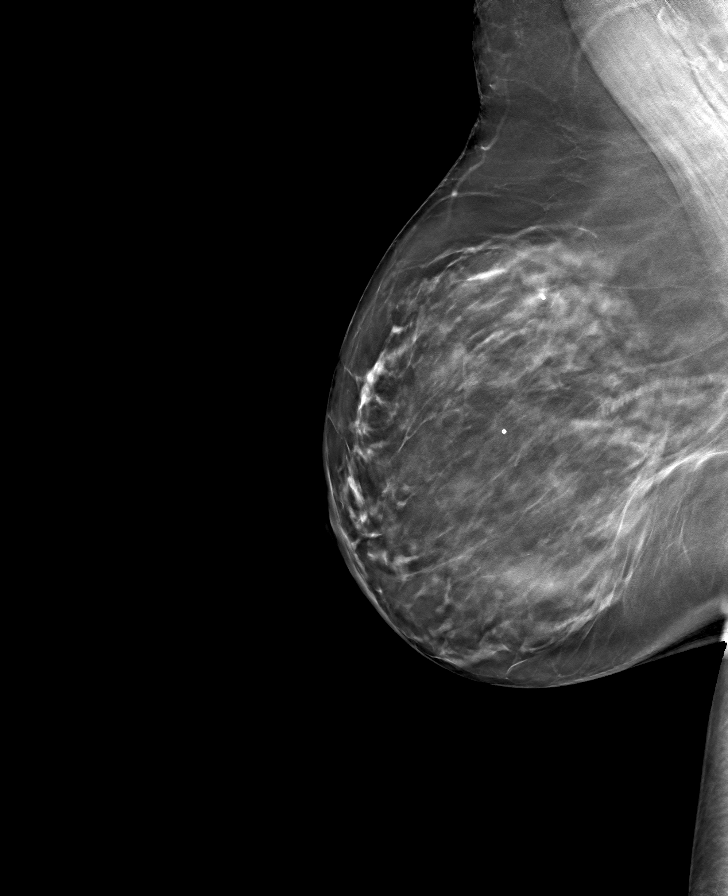

[8 of 24 positions shown; findings below may reference images not displayed]

ACR Breast Density Category c: The breast tissue is heterogeneously
dense, which may obscure small masses.
FINDINGS: There are no findings suspicious for malignancy. Images were
processed with CAD.
IMPRESSION: No mammographic evidence of malignancy. A result letter of this
screening mammogram will be mailed directly to the patient.

RECOMMENDATION:
Screening mammogram in one year. (Code:[id number]-[id number])

BI-RADS CATEGORY  1: Negative.

## 2021-02-19 NOTE — Patient Instructions (Signed)
Visit Information  Thank you for allowing the Chronic Care Management team to participate in your care.   Following is a copy of your full care plan:  Care Plan : RN Care Management Plan of Care       Problem: HTN, CHF, Asthma, Hypothyroidism, Hypercholesterolemia      Long-Range Goal: Disease Progression Prevented or Minimized   Start Date: 02/13/2021  Expected End Date: 05/14/2021  Priority: High  Note:   Current Barriers:  Care Coordination needs related to Medications  RNCM Clinical Goal(s):  Patient will continue to work with RN Care Manager care management team and Assisted Living staff to address concerns related to medication management.   Interventions: 1:1 collaboration with primary care provider regarding development and update of comprehensive plan of care as evidenced by provider attestation and co-signature Inter-disciplinary care team collaboration (see longitudinal plan of care) Evaluation of current treatment plan related to  self management and patient's adherence to plan as established by provider   Medications Evaluation of current treatment plan related to medication management. Reviewed medications and discussed indications for use. Several discrepancies were noted during the review today. Patient expressed concerns regarding accuracy of medications. Reports medications are currently being dispensed by the [facility] staff per facility protocol. Expressed concerns regarding accuracy of doses on her medication print out vice doses actually being administered.  Mrs. Evelyn reports tolerating the medications she is currently taking with no concerning side effects.  Thoroughly reviewed concerning s/sx that require medical follow up. Strongly advised to immediately  notify the facility staff with concerns. Attempted to contact the [facility] staff to review the medications currently on Mrs. Dovidio's file. Pending return call.   Patient Goals/Self-Care  Activities: Take all medications as prescribed Attend all scheduled provider appointments Perform all self care activities independently  Inform the facility staff and contact provider office for new concerns or questions     Follow Up Plan:   Will follow up within the next week.     Consent to CCM Services: Ms. Stanforth was given information about Chronic Care Management services including:  CCM service includes personalized support from designated clinical staff supervised by her physician, including individualized plan of care and coordination with other care providers 24/7 contact phone numbers for assistance for urgent and routine care needs. Service will only be billed when office clinical staff spend 20 minutes or more in a month to coordinate care. Only one practitioner may furnish and bill the service in a calendar month. The patient may stop CCM services at any time (effective at the end of the month) by phone call to the office staff. The patient will be responsible for cost sharing (co-pay) of up to 20% of the service fee (after annual deductible is met).  Patient agreed to services and verbal consent obtained.   Mrs. Huyett verbalized understanding of the information discussed during the telephonic outreach. Declined need for mailed/printed instructions.   A member of the care management team will follow up within the next week.   [facility] Care Management [facility] [phone]

## 2021-02-19 NOTE — Chronic Care Management (AMB) (Signed)
Care Management    RN Visit Note   Name: WINEFRED HILLESHEIM MRN: [id number] DOB: [birth year]  Subjective: Wanda Ochoa is a 75 y.o. year old female who is a primary care patient of Mikey Kirschner, [place]. The care management team was consulted for assistance with disease management and care coordination needs.    Engaged with patient by telephone for initial visit in response to provider referral for case management and care coordination services.   Consent to Services:   Ms. Ekstrom was given information about Care Management services today including:  Care Management services includes personalized support from designated clinical staff supervised by her physician, including individualized plan of care and coordination with other care providers 24/7 contact phone numbers for assistance for urgent and routine care needs. The patient may stop case management services at any time by phone call to the office staff.  Patient agreed to services and consent obtained.   Assessment: Review of patient past medical history, allergies, medications, health status, including review of consultants reports, laboratory and other test data, was performed as part of comprehensive evaluation and provision of chronic care management services.      Care Plan  Allergies  Allergen Reactions   Penicillins Anaphylaxis   Lipitor [Atorvastatin] Rash    Severe rash on skin after taking approximately 2 weeks.     Outpatient Encounter Medications as of 02/13/2021  Medication Sig Note   albuterol (VENTOLIN HFA) 108 (90 Base) MCG/ACT inhaler Inhale 2 puffs into the lungs every 4 (four) hours as needed for wheezing or shortness of breath. INHALE 2 PUFFS INTO THE LUNGS EVERY 6 HOURS AS NEEDED    aspirin 81 MG tablet Take 81 mg by mouth daily.    buPROPion ER (WELLBUTRIN SR) 100 MG 12 hr tablet Take 100 mg by mouth daily.    labetalol (NORMODYNE) 200 MG tablet Take 1 tablet (200 mg total) by mouth 2 (two)  times daily.    pravastatin (PRAVACHOL) 20 MG tablet Take 20 mg by mouth daily.    QUEtiapine (SEROQUEL) 100 MG tablet She is taking 2 tab at bedtime    traZODone (DESYREL) 50 MG tablet Take 25 mg by mouth at bedtime as needed.    amLODipine (NORVASC) 5 MG tablet Take 1 tablet (5 mg total) by mouth daily.    cyanocobalamin (,VITAMIN B-12,) 1000 MCG/ML injection Inject 1 mL (1,000 mcg total) into the muscle once a week. Once a week for 3 weeks and Than Once Monthly (Patient not taking: Reported on 02/13/2021) 02/13/2021: Reports it is not being administered at the facility.   furosemide (LASIX) 40 MG tablet Take 1 tablet (40 mg total) by mouth daily.    hydrOXYzine (VISTARIL) 25 MG capsule Take 25 mg by mouth daily as needed for anxiety.    levothyroxine (SYNTHROID) 100 MCG tablet Take 100 mcg by mouth daily.    losartan (COZAAR) 100 MG tablet Take 100 mg by mouth daily.    potassium chloride (KLOR-CON 10) 10 MEQ tablet Take 2 tablets (20 mEq total) by mouth daily.    vitamin B-12 (CYANOCOBALAMIN) 1000 MCG tablet Take 1 tablet (1,000 mcg total) by mouth daily.    No facility-administered encounter medications on file as of 02/13/2021.    Patient Active Problem List   Diagnosis Date Noted   Hypotension 02/12/2021   Hyperglycemia 02/12/2021   Anemia 02/12/2021   Acute asthma exacerbation 09/23/2020   Acute congestive heart failure ([place])    Vitamin B12 deficiency 08/31/2020  Frequent falls 08/30/2020   Weakness 66/44/0347   Complicated grieving 42/59/5638   Drug reaction, subsequent encounter 09/23/2019   Rash and nonspecific skin eruption 09/23/2019   Stable angina pectoris ([place]) 09/10/2019   Stage 3 chronic kidney disease ([place]) 09/10/2019   Idiopathic gout involving toe of right foot 12/07/2018   Hematuria 09/11/2016   Venous insufficiency of both lower extremities 09/04/2016   Severe recurrent major depression without psychotic features ([place])    Hypercholesteremia 05/02/2015    Mitral valve prolapse 02/21/2015   Allergic rhinitis 12/24/2014   Osteopenia 12/24/2014   Hypothyroidism 11/24/2014   Generalized anxiety disorder 02/24/2012   Nocturnal hypoxemia 01/20/2012   Abdominal mass 01/20/2012   Shortness of breath 01/20/2012   Temporary cerebral vascular dysfunction 01/02/2009   Benign hypertension 03/28/2008   Asthma, exogenous 10/22/2007   Adaptive colitis 03/12/2003    Care Plan : RN Care Management Plan of Care       Problem: HTN, CHF, Asthma, Hypothyroidism, Hypercholesterolemia      Long-Range Goal: Disease Progression Prevented or Minimized   Start Date: 02/13/2021  Expected End Date: 05/14/2021  Priority: High  Note:   Current Barriers:  Care Coordination needs related to Medications  RNCM Clinical Goal(s):  Patient will continue to work with RN Care Manager care management team and Assisted Living staff to address concerns related to medication management.   Interventions: 1:1 collaboration with primary care provider regarding development and update of comprehensive plan of care as evidenced by provider attestation and co-signature Inter-disciplinary care team collaboration (see longitudinal plan of care) Evaluation of current treatment plan related to  self management and patient's adherence to plan as established by provider   Medications Evaluation of current treatment plan related to medication management. Reviewed medications and discussed indications for use. Several discrepancies were noted during the review today. Patient expressed concerns regarding accuracy of medications. Reports medications are currently being dispensed by the [facility] staff per facility protocol. Expressed concerns regarding accuracy of doses on her medication print out vice doses actually being administered.  Mrs. Heathman reports tolerating the medications she is currently taking with no concerning side effects.  Thoroughly reviewed concerning s/sx that  require medical follow up. Strongly advised to immediately  notify the facility staff with concerns. Attempted to contact the [place] staff to review the medications currently on Mrs. Sheeler's file. Pending return call.   Patient Goals/Self-Care Activities: Take all medications as prescribed Attend all scheduled provider appointments Perform all self care activities independently  Inform the facility staff and contact provider office for new concerns or questions     Follow Up Plan:   Will follow up within the next week.     PLAN Pending return call from [facility] staff. Will follow up within the next week.   [facility] Care Management [facility] [phone]

## 2021-02-23 ENCOUNTER — Ambulatory Visit: Payer: Self-pay

## 2021-02-23 DIAGNOSIS — J45909 Unspecified asthma, uncomplicated: Secondary | ICD-10-CM

## 2021-02-23 DIAGNOSIS — R296 Repeated falls: Secondary | ICD-10-CM

## 2021-02-23 NOTE — Chronic Care Management (AMB) (Addendum)
Care Management    RN Visit Note   Name: Wanda Ochoa MRN: [id number] DOB: [birth year]  Subjective: Wanda Ochoa is a 75 y.o. year old female who is a primary care patient of Mikey Kirschner, [place]. The care management team was consulted for assistance with disease management and care coordination needs.    Follow up Care Coordination was conducted today regarding medications.  Consent to Services:   Wanda Ochoa was given information about Care Management services today including:  Care Management services includes personalized support from designated clinical staff supervised by her physician, including individualized plan of care and coordination with other care providers 24/7 contact phone numbers for assistance for urgent and routine care needs. The patient may stop case management services at any time by phone call to the office staff.  Patient agreed to services and consent obtained.   Assessment: Review of patient past medical history, allergies, medications, health status, including review of consultants reports, laboratory and other test data, was performed as part of comprehensive evaluation and provision of chronic care management services.   SDOH (Social Determinants of Health) assessments and interventions performed:  No  Care Plan  Allergies  Allergen Reactions   Penicillins Anaphylaxis   Lipitor [Atorvastatin] Rash    Severe rash on skin after taking approximately 2 weeks.     Outpatient Encounter Medications as of 02/23/2021  Medication Sig Note   albuterol (VENTOLIN HFA) 108 (90 Base) MCG/ACT inhaler Inhale 2 puffs into the lungs every 4 (four) hours as needed for wheezing or shortness of breath. INHALE 2 PUFFS INTO THE LUNGS EVERY 6 HOURS AS NEEDED    amLODipine (NORVASC) 5 MG tablet Take 1 tablet (5 mg total) by mouth daily.    aspirin 81 MG tablet Take 81 mg by mouth daily.    buPROPion ER (WELLBUTRIN SR) 100 MG 12 hr tablet Take 100 mg by mouth  daily.    cyanocobalamin (,VITAMIN B-12,) 1000 MCG/ML injection Inject 1 mL (1,000 mcg total) into the muscle once a week. Once a week for 3 weeks and Than Once Monthly (Patient not taking: Reported on 02/13/2021) 02/13/2021: Reports it is not being administered at the facility.   furosemide (LASIX) 40 MG tablet Take 1 tablet (40 mg total) by mouth daily.    hydrOXYzine (VISTARIL) 25 MG capsule Take 25 mg by mouth daily as needed for anxiety.    labetalol (NORMODYNE) 200 MG tablet Take 1 tablet (200 mg total) by mouth 2 (two) times daily.    levothyroxine (SYNTHROID) 100 MCG tablet Take 100 mcg by mouth daily.    losartan (COZAAR) 100 MG tablet Take 100 mg by mouth daily.    potassium chloride (KLOR-CON 10) 10 MEQ tablet Take 2 tablets (20 mEq total) by mouth daily.    pravastatin (PRAVACHOL) 20 MG tablet Take 20 mg by mouth daily.    QUEtiapine (SEROQUEL) 100 MG tablet She is taking 2 tab at bedtime    traZODone (DESYREL) 50 MG tablet Take 25 mg by mouth at bedtime as needed.    vitamin B-12 (CYANOCOBALAMIN) 1000 MCG tablet Take 1 tablet (1,000 mcg total) by mouth daily.    No facility-administered encounter medications on file as of 02/23/2021.    Patient Active Problem List   Diagnosis Date Noted   Hypotension 02/12/2021   Hyperglycemia 02/12/2021   Anemia 02/12/2021   Acute asthma exacerbation 09/23/2020   Acute congestive heart failure ([place])    Vitamin B12 deficiency 08/31/2020   Frequent  falls 08/30/2020   Weakness 33/29/5188   Complicated grieving 41/66/0630   Drug reaction, subsequent encounter 09/23/2019   Rash and nonspecific skin eruption 09/23/2019   Stable angina pectoris ([place]) 09/10/2019   Stage 3 chronic kidney disease ([place]) 09/10/2019   Idiopathic gout involving toe of right foot 12/07/2018   Hematuria 09/11/2016   Venous insufficiency of both lower extremities 09/04/2016   Severe recurrent major depression without psychotic features ([place])    Hypercholesteremia  05/02/2015   Mitral valve prolapse 02/21/2015   Allergic rhinitis 12/24/2014   Osteopenia 12/24/2014   Hypothyroidism 11/24/2014   Generalized anxiety disorder 02/24/2012   Nocturnal hypoxemia 01/20/2012   Abdominal mass 01/20/2012   Shortness of breath 01/20/2012   Temporary cerebral vascular dysfunction 01/02/2009   Benign hypertension 03/28/2008   Asthma, exogenous 10/22/2007   Adaptive colitis 03/12/2003     Care Coordination was conducted regarding medications. Wanda Ochoa is currently located at [facility]. Previously expressed concerns regarding changes in medications that are not reflected in her current profile. Attempted to review/confirm her current medication list with the [facility] facility nurse. Pending return call.   PLAN Pending follow up from [facility] staff. A member of the care management team will follow up within the next week.   Cristy [facility] Care Management [facility] [phone]

## 2021-02-26 ENCOUNTER — Telehealth: Payer: Self-pay | Admitting: Physician Assistant

## 2021-02-26 NOTE — Telephone Encounter (Signed)
CCM has been trying to contact [facility] to coordinate medication changes A med change form was faxed when pt came for a visit I called today and unable to reach anyone at all, unable to leave message

## 2021-02-27 ENCOUNTER — Ambulatory Visit: Payer: Self-pay

## 2021-02-27 DIAGNOSIS — J45909 Unspecified asthma, uncomplicated: Secondary | ICD-10-CM

## 2021-02-27 DIAGNOSIS — I10 Essential (primary) hypertension: Secondary | ICD-10-CM

## 2021-02-27 DIAGNOSIS — I5031 Acute diastolic (congestive) heart failure: Secondary | ICD-10-CM

## 2021-02-27 NOTE — Chronic Care Management (AMB) (Signed)
Care Management    RN Visit Note  02/27/2021 Name: Wanda Ochoa MRN: [id number] DOB: 1945-11-04  Subjective: Wanda Ochoa is a 75 y.o. year old female who is a primary care patient of Wanda Ochoa, [place]. The care management team was consulted for assistance with disease management and care coordination needs.    Collaborated with the [facility] staff today for follow up visit in response to provider referral for case management and care coordination services.   Consent to Services:   Wanda Ochoa was given information about Care Management services today including:  Care Management services includes personalized support from designated clinical staff supervised by her physician, including individualized plan of care and coordination with other care providers 24/7 contact phone numbers for assistance for urgent and routine care needs. The patient may stop case management services at any time by phone call to the office staff.  Patient agreed to services and consent obtained.   Assessment: Review of patient past medical history, allergies, medications, health status, including review of consultants reports, laboratory and other test data, was performed as part of comprehensive evaluation and provision of chronic care management services.   SDOH (Social Determinants of Health) assessments and interventions performed:  No  Care Plan  Allergies  Allergen Reactions   Penicillins Anaphylaxis   Lipitor [Atorvastatin] Rash    Severe rash on skin after taking approximately 2 weeks.     Outpatient Encounter Medications as of 02/27/2021  Medication Sig Note   albuterol (VENTOLIN HFA) 108 (90 Base) MCG/ACT inhaler Inhale 2 puffs into the lungs every 4 (four) hours as needed for wheezing or shortness of breath. INHALE 2 PUFFS INTO THE LUNGS EVERY 6 HOURS AS NEEDED    amLODipine (NORVASC) 5 MG tablet Take 1 tablet (5 mg total) by mouth daily.    aspirin 81 MG tablet  Take 81 mg by mouth daily.    buPROPion ER (WELLBUTRIN SR) 100 MG 12 hr tablet Take 100 mg by mouth daily.    cyanocobalamin (,VITAMIN B-12,) 1000 MCG/ML injection Inject 1 mL (1,000 mcg total) into the muscle once a week. Once a week for 3 weeks and Than Once Monthly (Patient not taking: Reported on 02/13/2021) 02/13/2021: Reports it is not being administered at the facility.   furosemide (LASIX) 40 MG tablet Take 1 tablet (40 mg total) by mouth daily.    hydrOXYzine (VISTARIL) 25 MG capsule Take 25 mg by mouth daily as needed for anxiety.    labetalol (NORMODYNE) 200 MG tablet Take 1 tablet (200 mg total) by mouth 2 (two) times daily.    levothyroxine (SYNTHROID) 100 MCG tablet Take 100 mcg by mouth daily.    losartan (COZAAR) 100 MG tablet Take 100 mg by mouth daily.    potassium chloride (KLOR-CON 10) 10 MEQ tablet Take 2 tablets (20 mEq total) by mouth daily.    pravastatin (PRAVACHOL) 20 MG tablet Take 20 mg by mouth daily.    QUEtiapine (SEROQUEL) 100 MG tablet She is taking 2 tab at bedtime    traZODone (DESYREL) 50 MG tablet Take 25 mg by mouth at bedtime as needed.    vitamin B-12 (CYANOCOBALAMIN) 1000 MCG tablet Take 1 tablet (1,000 mcg total) by mouth daily.    No facility-administered encounter medications on file as of 02/27/2021.    Patient Active Problem List   Diagnosis Date Noted   Hypotension 02/12/2021   Hyperglycemia 02/12/2021   Anemia 02/12/2021   Acute asthma exacerbation 09/23/2020  Acute congestive heart failure (HCC)    Vitamin B12 deficiency 08/31/2020   Frequent falls 08/30/2020   Weakness 53/29/9242   Complicated grieving 68/34/1962   Drug reaction, subsequent encounter 09/23/2019   Rash and nonspecific skin eruption 09/23/2019   Stable angina pectoris ([place]) 09/10/2019   Stage 3 chronic kidney disease ([place]) 09/10/2019   Idiopathic gout involving toe of right foot 12/07/2018   Hematuria 09/11/2016   Venous insufficiency of both lower extremities  09/04/2016   Severe recurrent major depression without psychotic features ([place])    Hypercholesteremia 05/02/2015   Mitral valve prolapse 02/21/2015   Allergic rhinitis 12/24/2014   Osteopenia 12/24/2014   Hypothyroidism 11/24/2014   Generalized anxiety disorder 02/24/2012   Nocturnal hypoxemia 01/20/2012   Abdominal mass 01/20/2012   Shortness of breath 01/20/2012   Temporary cerebral vascular dysfunction 01/02/2009   Benign hypertension 03/28/2008   Asthma, exogenous 10/22/2007   Adaptive colitis 03/12/2003    Patient Care Plan: RN Care Management Plan of Care     Problem Identified: HTN, CHF, Asthma, Hypothyroidism, Hypercholesterolemia      Long-Range Goal: Disease Progression Prevented or Minimized   Start Date: 02/13/2021  Expected End Date: 05/14/2021  Priority: High  Note:   Current Barriers:  Care Coordination needs related to Medications  RNCM Clinical Goal(s):  Patient will continue to work with RN Care Manager care management team and Assisted Living staff to address concerns related to medication management.   Interventions: 1:1 collaboration with primary care provider regarding development and update of comprehensive plan of care as evidenced by provider attestation and co-signature Inter-disciplinary care team collaboration (see longitudinal plan of care) Evaluation of current treatment plan related to  self management and patient's adherence to plan as established by provider   Medications Evaluation of current treatment plan related to medication management. Reviewed medications and discussed indications for use. Several discrepancies were noted during the review today. Patient expressed concerns regarding accuracy of medications. Reports medications are currently being dispensed by the [facility] staff per facility protocol. Expressed concerns regarding accuracy of doses on her medication print out vice doses actually being administered.  Wanda Ochoa  reports tolerating the medications she is currently taking with no concerning side effects.  Thoroughly reviewed concerning s/sx that require medical follow up. Strongly advised to immediately  notify the facility staff with concerns. Attempted to contact the [facility] staff to review the medications currently on Mrs. Eastman's file. Pending return call.   Patient Goals/Self-Care Activities: Take all medications as prescribed Attend all scheduled provider appointments Perform all self care activities independently  Inform the facility staff and contact provider office for new concerns or questions         PLAN A member of the care management team will follow up later this month.   [facility] Care Management [facility] [phone]

## 2021-03-07 DIAGNOSIS — N1832 Chronic kidney disease, stage 3b: Secondary | ICD-10-CM | POA: Diagnosis not present

## 2021-03-07 DIAGNOSIS — R809 Proteinuria, unspecified: Secondary | ICD-10-CM | POA: Diagnosis not present

## 2021-03-07 DIAGNOSIS — I10 Essential (primary) hypertension: Secondary | ICD-10-CM | POA: Diagnosis not present

## 2021-03-08 DIAGNOSIS — F330 Major depressive disorder, recurrent, mild: Secondary | ICD-10-CM | POA: Diagnosis not present

## 2021-03-08 DIAGNOSIS — I130 Hypertensive heart and chronic kidney disease with heart failure and stage 1 through stage 4 chronic kidney disease, or unspecified chronic kidney disease: Secondary | ICD-10-CM | POA: Diagnosis not present

## 2021-03-08 DIAGNOSIS — E039 Hypothyroidism, unspecified: Secondary | ICD-10-CM | POA: Diagnosis not present

## 2021-03-08 DIAGNOSIS — N1831 Chronic kidney disease, stage 3a: Secondary | ICD-10-CM | POA: Diagnosis not present

## 2021-03-08 DIAGNOSIS — F411 Generalized anxiety disorder: Secondary | ICD-10-CM | POA: Diagnosis not present

## 2021-03-08 DIAGNOSIS — I5040 Unspecified combined systolic (congestive) and diastolic (congestive) heart failure: Secondary | ICD-10-CM | POA: Diagnosis not present

## 2021-03-09 DIAGNOSIS — F331 Major depressive disorder, recurrent, moderate: Secondary | ICD-10-CM | POA: Diagnosis not present

## 2021-03-09 DIAGNOSIS — F411 Generalized anxiety disorder: Secondary | ICD-10-CM | POA: Diagnosis not present

## 2021-03-09 DIAGNOSIS — F5105 Insomnia due to other mental disorder: Secondary | ICD-10-CM | POA: Diagnosis not present

## 2021-04-18 ENCOUNTER — Ambulatory Visit: Payer: BLUE CROSS/BLUE SHIELD | Admitting: Physician Assistant

## 2021-04-24 ENCOUNTER — Telehealth: Payer: Self-pay | Admitting: Physician Assistant

## 2021-04-24 NOTE — Telephone Encounter (Signed)
Copied from [place] [id number]. Topic: Medicare AWV >> Apr 24, 2021  2:46 PM Cher Nakai R wrote: Reason for CRM:  Left message for patient to call back and schedule Medicare Annual Wellness Visit (AWV) in office.   If not able to come in office, please offer to do virtually or by telephone.   Last AWV: 08/10/2019  Please schedule at anytime with [facility] Health Advisor.  If any questions, please contact me at [phone]

## 2021-07-25 ENCOUNTER — Telehealth: Payer: Self-pay | Admitting: Physician Assistant

## 2021-07-25 NOTE — Telephone Encounter (Signed)
Copied from [place]. Topic: Medicare AWV ?>> Jul 25, 2021 11:00 AM Cher Nakai R wrote: ?Reason for CRM:  ?Left message for patient to call back and schedule Medicare Annual Wellness Visit (AWV) in office.  ? ?If unable to come into the office for AWV,  please offer to do virtually or by telephone. ? ?Last AWV:  08/10/2019 ? ?Please schedule at anytime with [facility] Health Advisor. ? ?30 minute appointment for Virtual or phone ?45 minute appointment for in office or Initial virtual/phone ? ?Any questions, please contact me at [phone] ?

## 2021-09-12 ENCOUNTER — Telehealth: Payer: Self-pay | Admitting: Physician Assistant

## 2021-09-12 NOTE — Telephone Encounter (Signed)
Copied from [place] [id number]. Topic: Medicare AWV >> Sep 12, 2021  3:59 PM Jae Dire wrote: Reason for CRM:  No answer unable to leave a message for patient to call back and schedule Medicare Annual Wellness Visit (AWV) in office.   If unable to come into the office for AWV,  please offer to do virtually or by telephone.  Last AWV: 08/10/2019  Please schedule at anytime with [facility] Health Advisor.  30 minute appointment for Virtual or phone 45 minute appointment for in office or Initial virtual/phone  Any questions, please contact me at [phone]

## 2021-11-01 ENCOUNTER — Ambulatory Visit: Payer: Self-pay

## 2021-11-01 NOTE — Chronic Care Management (AMB) (Signed)
   11/01/2021  [place] 12-Jul-1945 [id number]   Documentation encounter created to complete case transition. The care management team will follow up for care coordination as needed.  [facility] Management [phone]

## 2021-11-13 ENCOUNTER — Ambulatory Visit (INDEPENDENT_AMBULATORY_CARE_PROVIDER_SITE_OTHER): Payer: Medicare Other

## 2021-11-13 VITALS — Wt 165.0 lb

## 2021-11-13 DIAGNOSIS — Z0000 Encounter for general adult medical examination without abnormal findings: Secondary | ICD-10-CM

## 2021-11-13 NOTE — Progress Notes (Signed)
Virtual Visit via Telephone Note  I connected with  Wanda Ochoa on 11/13/21 at  1:30 PM EDT by telephone and verified that I am speaking with the correct person using two identifiers.  Location: Patient: home Provider: [facility] Persons participating in the virtual visit: [place]   I discussed the limitations, risks, security and privacy concerns of performing an evaluation and management service by telephone and the availability of in person appointments. The patient expressed understanding and agreed to proceed.  Interactive audio and video telecommunications were attempted between this nurse and patient, however failed, due to patient having technical difficulties OR patient did not have access to video capability.  We continued and completed visit with audio only.  Some vital signs may be absent or patient reported.   Dionisio [name], LPN  Subjective:   Wanda Ochoa is a 76 y.o. female who presents for Medicare Annual (Subsequent) preventive examination.  Review of Systems           Objective:    There were no vitals filed for this visit. There is no height or weight on file to calculate BMI.     10/02/2020   12:45 PM 09/25/2020    9:00 PM 09/24/2020    1:35 PM 09/22/2020   10:59 PM 08/29/2020    8:45 PM 08/29/2020    2:05 PM 08/28/2020   12:47 PM  Advanced Directives  Does Patient Have a Medical Advance Directive? Yes Yes Yes No  Yes No  Type of Academic librarian Living will;Healthcare Power of Attorney Living will;Healthcare Power of [place];Living will;Out of facility DNR (pink MOST or yellow form)   Does patient want to make changes to medical advance directive? No - Patient declined No - Patient declined       Copy of [place] in Chart?  No - copy requested No - copy requested  No - copy requested      Current Medications  (verified) Outpatient Encounter Medications as of 11/13/2021  Medication Sig   albuterol (VENTOLIN HFA) 108 (90 Base) MCG/ACT inhaler Inhale 2 puffs into the lungs every 4 (four) hours as needed for wheezing or shortness of breath. INHALE 2 PUFFS INTO THE LUNGS EVERY 6 HOURS AS NEEDED   amLODipine (NORVASC) 10 MG tablet Take 10 mg by mouth daily.   amLODipine (NORVASC) 2.5 MG tablet Take 1 tablet every day by oral route.   amLODipine (NORVASC) 5 MG tablet Take by mouth.   aspirin 81 MG tablet Take 81 mg by mouth daily.   buPROPion (WELLBUTRIN) 100 MG tablet Take 100 mg by mouth 2 (two) times daily.   calcium-vitamin D (OSCAL WITH D) 500-5 MG-MCG tablet Take 1 tablet by mouth daily.   chlorthalidone (HYGROTON) 25 MG tablet Take 25 mg by mouth daily.   Cholecalciferol (VITAMIN D-1000 MAX ST) 25 MCG (1000 UT) tablet Take by mouth.   citalopram (CELEXA) 20 MG tablet Take by mouth.   citalopram (CELEXA) 40 MG tablet    furosemide (LASIX) 20 MG tablet Take by mouth.   hydrOXYzine (VISTARIL) 25 MG capsule Take 25 mg by mouth daily as needed for anxiety.   levothyroxine (SYNTHROID) 100 MCG tablet Take 100 mcg by mouth daily.   levothyroxine (SYNTHROID) 112 MCG tablet Take 1 tablet every day by oral route.   levothyroxine (SYNTHROID) 75 MCG tablet    levothyroxine (SYNTHROID) 88 MCG tablet  metoprolol succinate (TOPROL XL) 50 MG 24 hr tablet Take 1 tablet every day by oral route.   metoprolol tartrate (LOPRESSOR) 50 MG tablet TK 1 T PO BID   mirtazapine (REMERON) 30 MG tablet TK 1 T PO HS   nortriptyline (PAMELOR) 25 MG capsule Take 1 capsule 3 times a day by oral route.   pravastatin (PRAVACHOL) 20 MG tablet Take 20 mg by mouth daily.   QUEtiapine (SEROQUEL) 100 MG tablet She is taking 2 tab at bedtime   QUEtiapine (SEROQUEL) 25 MG tablet Take 1 tablet twice a day by oral route.   sertraline (ZOLOFT) 100 MG tablet    vitamin B-12 (CYANOCOBALAMIN) 1000 MCG tablet Take 1 tablet (1,000 mcg total)  by mouth daily.   amLODipine (NORVASC) 5 MG tablet Take 1 tablet (5 mg total) by mouth daily.   cyanocobalamin (,VITAMIN B-12,) 1000 MCG/ML injection Inject 1 mL (1,000 mcg total) into the muscle once a week. Once a week for 3 weeks and Than Once Monthly (Patient not taking: Reported on 02/13/2021)   furosemide (LASIX) 40 MG tablet Take 1 tablet (40 mg total) by mouth daily.   labetalol (NORMODYNE) 200 MG tablet Take 1 tablet (200 mg total) by mouth 2 (two) times daily. (Patient not taking: Reported on 11/13/2021)   lisinopril (ZESTRIL) 20 MG tablet TK 1 T PO BID (Patient not taking: Reported on 11/13/2021)   losartan (COZAAR) 100 MG tablet Take 100 mg by mouth daily.   Melatonin 5 MG CAPS Take by mouth. (Patient not taking: Reported on 11/13/2021)   potassium chloride (KLOR-CON 10) 10 MEQ tablet Take 2 tablets (20 mEq total) by mouth daily.   traZODone (DESYREL) 50 MG tablet Take 25 mg by mouth at bedtime as needed. (Patient not taking: Reported on 11/13/2021)   [DISCONTINUED] buPROPion ER (WELLBUTRIN SR) 100 MG 12 hr tablet Take 100 mg by mouth daily.   [DISCONTINUED] MELATONIN PO Take by mouth at bedtime.   No facility-administered encounter medications on file as of 11/13/2021.    Allergies (verified) Penicillins and Lipitor [atorvastatin]   History: Past Medical History:  Diagnosis Date   Anxiety    Arthritis    right knee   Asthma    Depression    catatonic depression per pt report   HTN (hypertension)    Thyroid disease    Past Surgical History:  Procedure Laterality Date   ABDOMINAL HYSTERECTOMY  1990   BLADDER SUSPENSION  1990   BREAST CYST ASPIRATION Right 1985   BREAST EXCISIONAL BIOPSY Right 1967   EXCISIONAL BX - NEG   BREAST SURGERY Right 1967   Lumpectomy   CESAREAN SECTION     COLONOSCOPY WITH PROPOFOL N/A 12/18/2018   Procedure: COLONOSCOPY WITH PROPOFOL;  Surgeon: Lollie Sails, MD;  Location: [facility] ENDOSCOPY;  Service: Endoscopy;  Laterality: N/A;    HEMORRHOIDECTOMY WITH HEMORRHOID BANDING  1988   Family History  Problem Relation Age of Onset   Depression Father    Suicidality Father    Depression Sister    Breast cancer Sister 64   Autism Son    Heart disease Mother    Colon cancer Mother    Hypertension Mother    Anxiety disorder Mother    Mental illness Daughter    Allergies Brother    Allergies Sister    Asthma Maternal Uncle    Heart disease Maternal Grandfather    Heart disease Maternal Grandmother    Breast cancer Paternal Grandmother    Breast  cancer Other    Social History   Socioeconomic History   Marital status: Married    Spouse name: Heavan Francom   Number of children: 3   Years of education: 16   Highest education level: Bachelor's degree (e.g., BA, AB, BS)  Occupational History   Occupation: retired    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  Tobacco Use   Smoking status: Never   Smokeless tobacco: Never  Vaping Use   Vaping Use: Never used  Substance and Sexual Activity   Alcohol use: No   Drug use: No   Sexual activity: Not on file  Other Topics Concern   Not on file  Social History Narrative   Not on file   Social Determinants of Health   Financial Resource Strain: Low Risk  (08/10/2019)   Overall Financial Resource Strain (CARDIA)    Difficulty of Paying Living Expenses: Not hard at all  Food Insecurity: No Food Insecurity (08/10/2019)   Hunger Vital Sign    Worried About Running Out of Food in the Last Year: Never true    [place] in the Last Year: Never true  Transportation Needs: No Transportation Needs (08/10/2019)   PRAPARE - Hydrologist (Medical): No    Lack of Transportation (Non-Medical): No  Physical Activity: Insufficiently Active (11/13/2021)   Exercise Vital Sign    Days of Exercise per Week: 2 days    Minutes of Exercise per Session: 20 min  Stress: No Stress Concern Present (11/13/2021)    [place]    Feeling of Stress : Only a little  Social Connections: Moderately Integrated (08/10/2019)   Social Connection and Isolation Panel [NHANES]    Frequency of Communication with Friends and Family: Twice a week    Frequency of Social Gatherings with Friends and Family: More than three times a week    Attends Religious Services: Never    Marine scientist or Organizations: Yes    Attends Music therapist: More than 4 times per year    Marital Status: Married    Tobacco Counseling Counseling given: Not Answered   Clinical Intake:  Pre-visit preparation completed: Yes        Nutritional Risks: None Diabetes: No  How often do you need to have someone help you when you read instructions, pamphlets, or other written materials from your doctor or pharmacy?: 1 - Never  Diabetic?no  Interpreter Needed?: No  Information entered by :: Kirke Shaggy, LPN   Activities of Daily Living    02/12/2021    1:45 PM  In your present state of health, do you have any difficulty performing the following activities:  Hearing? 0  Vision? 0  Comment sometimes  Walking or climbing stairs? 1  Dressing or bathing? 0  Comment unsure; can not do that at the moment    Patient Care Team: Mikey Kirschner, PA-C as PCP - General (Physician Assistant) Chauncey Mann, MD as Referring Physician (Psychiatry) Corey Skains, MD as Consulting Physician (Cardiology) Lollie Sails, MD (Inactive) as Consulting Physician (Gastroenterology) Miguel Dibble, LCSW as Social Worker (Licensed Clinical Social Worker) Dingeldein, Remo Lipps, MD (Ophthalmology)  Indicate any recent Medical Services you may have received from other than [facility] providers in the past year (date may be approximate).     Assessment:   This is a routine wellness examination for Joniah.  Hearing/Vision  screen No results found.  Dietary  issues and exercise activities discussed:     Goals Addressed             This Visit's Progress    DIET - EAT MORE FRUITS AND VEGETABLES         Depression Screen    11/13/2021    1:38 PM 02/12/2021    1:46 PM 08/10/2019    8:29 AM 04/30/2019    4:34 PM 02/08/2019   11:38 AM 08/05/2018    2:06 PM 06/13/2017   11:13 AM  PHQ 2/9 Scores  PHQ - 2 Score 1 0 0 1 0 0 0  PHQ- 9 Score '1 1  3 3      '$ Fall Risk    02/12/2021    1:44 PM 08/10/2019    8:31 AM 08/05/2018    2:06 PM 06/13/2017   11:13 AM 05/14/2016    2:07 PM  Fall Risk   Falls in the past year? '1 1 1 '$ No Yes  Number falls in past yr: '1 1 1  1  '$ Injury with Fall? 1 0 0  No  Risk for fall due to : History of fall(s) Other (Comment)     Risk for fall due to: Comment  Accidental falls     Follow up  Falls prevention discussed Falls prevention discussed    Comment  Completed PT for previous falls.       FALL RISK PREVENTION PERTAINING TO THE HOME:  Any stairs in or around the home? No  If so, are there any without handrails? No  Home free of loose throw rugs in walkways, pet beds, electrical cords, etc? Yes  Adequate lighting in your home to reduce risk of falls? Yes   ASSISTIVE DEVICES UTILIZED TO PREVENT FALLS:  Life alert? Yes  Use of a cane, walker or w/c? Yes  Grab bars in the bathroom? Yes  Shower chair or bench in shower? Yes  Elevated toilet seat or a handicapped toilet? Yes    Cognitive Function:declined PT is A & O    05/03/2019    8:41 AM 02/09/2016    8:54 AM 05/26/2015    8:50 AM 05/19/2015   10:02 AM  MMSE - Mini Mental State Exam  Orientation to time      Orientation to Place      Registration      Attention/ Calculation      Recall      Language- name 2 objects      Language- repeat      Language- follow 3 step command      Language- read & follow direction      Write a sentence      Copy design      Total score         Information is confidential and restricted. Go to Review Flowsheets  to unlock data.        08/10/2019    8:36 AM 08/05/2018    2:11 PM 06/13/2017   11:15 AM  6CIT Screen  What Year? 0 points 0 points 0 points  What month? 0 points 0 points 0 points  What time? 0 points 0 points 0 points  Count back from 20 0 points 0 points 0 points  Months in reverse 0 points 0 points 0 points  Repeat phrase 6 points 0 points 0 points  Total Score 6 points 0 points 0 points    Immunizations Immunization History  Administered  Date(s) Administered   Influenza Split 01/16/2012   Influenza, High Dose Seasonal PF 01/17/2016   Influenza-Unspecified 12/23/2012, 12/05/2017, 01/08/2019   PFIZER(Purple Top)SARS-COV-2 Vaccination 04/12/2019, 05/04/2019   PPD Test 10/12/2020, 08/02/2021   Pneumococcal Conjugate-13 04/13/2014   Pneumococcal Polysaccharide-23 01/16/2012   Tdap 04/13/2014   Zoster, Live 01/29/2010    TDAP status: Up to date  Flu Vaccine status: Up to date  Pneumococcal vaccine status: Up to date  Covid-19 vaccine status: Completed vaccines  Qualifies for Shingles Vaccine? Yes   Zostavax completed Yes   Shingrix Completed?: No.    Education has been provided regarding the importance of this vaccine. Patient has been advised to call insurance company to determine out of pocket expense if they have not yet received this vaccine. Advised may also receive vaccine at local pharmacy or Health Dept. Verbalized acceptance and understanding.  Screening Tests Health Maintenance  Topic Date Due   Zoster Vaccines- Shingrix (1 of 2) Never done   COVID-19 Vaccine (3 - Pfizer risk series) 06/01/2019   INFLUENZA VACCINE  10/23/2021   DEXA SCAN  07/24/2022   COLONOSCOPY (Pts 45-40yr Insurance coverage will need to be confirmed)  12/18/2023   TETANUS/TDAP  04/13/2024   Pneumonia Vaccine 75 Years old  Completed   Hepatitis C Screening  Completed   HPV VACCINES  Aged Out    Health Maintenance  Health Maintenance Due  Topic Date Due   Zoster Vaccines-  Shingrix (1 of 2) Never done   COVID-19 Vaccine (3 - Pfizer risk series) 06/01/2019   INFLUENZA VACCINE  10/23/2021    Colorectal cancer screening: No longer required.   Mammogram status: No longer required due to age.  Bone Density status: Completed 07/23/17. Results reflect: Bone density results: OSTEOPENIA. Repeat every 5 years.  Lung Cancer Screening: (Low Dose CT Chest recommended if Age 611-80years, 30 pack-year currently smoking OR have quit w/in 15years.) does not qualify.   Additional Screening:  Hepatitis C Screening: does qualify; Completed 05/16/16  Vision Screening: Recommended annual ophthalmology exams for early detection of glaucoma and other disorders of the eye. Is the patient up to date with their annual eye exam?  No  Who is the provider or what is the name of the office in which the patient attends annual eye exams? No one If pt is not established with a provider, would they like to be referred to a provider to establish care? No .   Dental Screening: Recommended annual dental exams for proper oral hygiene  Community Resource Referral / Chronic Care Management: CRR required this visit?  No   CCM required this visit?  No      Plan:     I have personally reviewed and noted the following in the patient's chart:   Medical and social history Use of alcohol, tobacco or illicit drugs  Current medications and supplements including opioid prescriptions. Patient is not currently taking opioid prescriptions. Functional ability and status Nutritional status Physical activity Advanced directives List of other physicians Hospitalizations, surgeries, and ER visits in previous 12 months Vitals Screenings to include cognitive, depression, and falls Referrals and appointments  In addition, I have reviewed and discussed with patient certain preventive protocols, quality metrics, and best practice recommendations. A written personalized care plan for preventive services  as well as general preventive health recommendations were provided to patient.     LDionisio [name] LPN   [id number]  Nurse Notes: none

## 2021-11-13 NOTE — Patient Instructions (Signed)
Wanda Ochoa , Thank you for taking time to come for your Medicare Wellness Visit. I appreciate your ongoing commitment to your health goals. Please review the following plan we discussed and let me know if I can assist you in the future.   Screening recommendations/referrals: Colonoscopy: aged out Mammogram: aged out Bone Density: 07/23/17 Recommended yearly ophthalmology/optometry visit for glaucoma screening and checkup Recommended yearly dental visit for hygiene and checkup  Vaccinations: Influenza vaccine: 12/05/20 Pneumococcal vaccine: 04/13/14 Tdap vaccine: 04/13/14 Shingles vaccine: Zostavax 01/29/10   Covid-19:04/12/19, 05/04/19  Advanced directives: yes  Conditions/risks identified: no  Next appointment: Follow up in one year for your annual wellness visit 11/18/22 @ 11 am by phone    Preventive Care 65 Years and Older, Female Preventive care refers to lifestyle choices and visits with your health care provider that can promote health and wellness. What does preventive care include? A yearly physical exam. This is also called an annual well check. Dental exams once or twice a year. Routine eye exams. Ask your health care provider how often you should have your eyes checked. Personal lifestyle choices, including: Daily care of your teeth and gums. Regular physical activity. Eating a healthy diet. Avoiding tobacco and drug use. Limiting alcohol use. Practicing safe sex. Taking low-dose aspirin every day. Taking vitamin and mineral supplements as recommended by your health care provider. What happens during an annual well check? The services and screenings done by your health care provider during your annual well check will depend on your age, overall health, lifestyle risk factors, and family history of disease. Counseling  Your health care provider may ask you questions about your: Alcohol use. Tobacco use. Drug use. Emotional well-being. Home and relationship  well-being. Sexual activity. Eating habits. History of falls. Memory and ability to understand (cognition). Work and work Statistician. Reproductive health. Screening  You may have the following tests or measurements: Height, weight, and BMI. Blood pressure. Lipid and cholesterol levels. These may be checked every 5 years, or more frequently if you are over 64 years old. Skin check. Lung cancer screening. You may have this screening every year starting at age 6 if you have a 30-pack-year history of smoking and currently smoke or have quit within the past 15 years. Fecal occult blood test (FOBT) of the stool. You may have this test every year starting at age 73. Flexible sigmoidoscopy or colonoscopy. You may have a sigmoidoscopy every 5 years or a colonoscopy every 10 years starting at age 16. Hepatitis C blood test. Hepatitis B blood test. Sexually transmitted disease (STD) testing. Diabetes screening. This is done by checking your blood sugar (glucose) after you have not eaten for a while (fasting). You may have this done every 1-3 years. Bone density scan. This is done to screen for osteoporosis. You may have this done starting at age 35. Mammogram. This may be done every 1-2 years. Talk to your health care provider about how often you should have regular mammograms. Talk with your health care provider about your test results, treatment options, and if necessary, the need for more tests. Vaccines  Your health care provider may recommend certain vaccines, such as: Influenza vaccine. This is recommended every year. Tetanus, diphtheria, and acellular pertussis (Tdap, Td) vaccine. You may need a Td booster every 10 years. Zoster vaccine. You may need this after age 48. Pneumococcal 13-valent conjugate (PCV13) vaccine. One dose is recommended after age 105. Pneumococcal polysaccharide (PPSV23) vaccine. One dose is recommended after age 48. Talk to  your health care provider about which  screenings and vaccines you need and how often you need them. This information is not intended to replace advice given to you by your health care provider. Make sure you discuss any questions you have with your health care provider. Document Released: 04/07/2015 Document Revised: 11/29/2015 Document Reviewed: 01/10/2015 Elsevier Interactive Patient Education  2017 [place] Prevention in the Home Falls can cause injuries. They can happen to people of all ages. There are many things you can do to make your home safe and to help prevent falls. What can I do on the outside of my home? Regularly fix the edges of walkways and driveways and fix any cracks. Remove anything that might make you trip as you walk through a door, such as a raised step or threshold. Trim any bushes or trees on the path to your home. Use bright outdoor lighting. Clear any walking paths of anything that might make someone trip, such as rocks or tools. Regularly check to see if handrails are loose or broken. Make sure that both sides of any steps have handrails. Any raised decks and porches should have guardrails on the edges. Have any leaves, snow, or ice cleared regularly. Use sand or salt on walking paths during winter. Clean up any spills in your garage right away. This includes oil or grease spills. What can I do in the bathroom? Use night lights. Install grab bars by the toilet and in the tub and shower. Do not use towel bars as grab bars. Use non-skid mats or decals in the tub or shower. If you need to sit down in the shower, use a plastic, non-slip stool. Keep the floor dry. Clean up any water that spills on the floor as soon as it happens. Remove soap buildup in the tub or shower regularly. Attach bath mats securely with double-sided non-slip rug tape. Do not have throw rugs and other things on the floor that can make you trip. What can I do in the bedroom? Use night lights. Make sure that you have a  light by your bed that is easy to reach. Do not use any sheets or blankets that are too big for your bed. They should not hang down onto the floor. Have a firm chair that has side arms. You can use this for support while you get dressed. Do not have throw rugs and other things on the floor that can make you trip. What can I do in the kitchen? Clean up any spills right away. Avoid walking on wet floors. Keep items that you use a lot in easy-to-reach places. If you need to reach something above you, use a strong step stool that has a grab bar. Keep electrical cords out of the way. Do not use floor polish or wax that makes floors slippery. If you must use wax, use non-skid floor wax. Do not have throw rugs and other things on the floor that can make you trip. What can I do with my stairs? Do not leave any items on the stairs. Make sure that there are handrails on both sides of the stairs and use them. Fix handrails that are broken or loose. Make sure that handrails are as long as the stairways. Check any carpeting to make sure that it is firmly attached to the stairs. Fix any carpet that is loose or worn. Avoid having throw rugs at the top or bottom of the stairs. If you do have throw rugs, attach  them to the floor with carpet tape. Make sure that you have a light switch at the top of the stairs and the bottom of the stairs. If you do not have them, ask someone to add them for you. What else can I do to help prevent falls? Wear shoes that: Do not have high heels. Have rubber bottoms. Are comfortable and fit you well. Are closed at the toe. Do not wear sandals. If you use a stepladder: Make sure that it is fully opened. Do not climb a closed stepladder. Make sure that both sides of the stepladder are locked into place. Ask someone to hold it for you, if possible. Clearly mark and make sure that you can see: Any grab bars or handrails. First and last steps. Where the edge of each step  is. Use tools that help you move around (mobility aids) if they are needed. These include: Canes. Walkers. Scooters. Crutches. Turn on the lights when you go into a dark area. Replace any light bulbs as soon as they burn out. Set up your furniture so you have a clear path. Avoid moving your furniture around. If any of your floors are uneven, fix them. If there are any pets around you, be aware of where they are. Review your medicines with your doctor. Some medicines can make you feel dizzy. This can increase your chance of falling. Ask your doctor what other things that you can do to help prevent falls. This information is not intended to replace advice given to you by your health care provider. Make sure you discuss any questions you have with your health care provider. Document Released: 01/05/2009 Document Revised: 08/17/2015 Document Reviewed: 04/15/2014 Elsevier Interactive Patient Education  2017 [employer].

## 2021-11-28 ENCOUNTER — Ambulatory Visit: Payer: Medicare Other | Admitting: Physician Assistant

## 2021-11-28 NOTE — Progress Notes (Deleted)
Established patient visit   Patient: Wanda Ochoa   DOB: [birth year]   76 y.o. Female  MRN: [id number] Visit Date: 11/28/2021  Today's healthcare provider: Mikey Kirschner, PA-C   No chief complaint on file.  Subjective    HPI  ***  Medications: Outpatient Medications Prior to Visit  Medication Sig   albuterol (VENTOLIN HFA) 108 (90 Base) MCG/ACT inhaler Inhale 2 puffs into the lungs every 4 (four) hours as needed for wheezing or shortness of breath. INHALE 2 PUFFS INTO THE LUNGS EVERY 6 HOURS AS NEEDED   amLODipine (NORVASC) 10 MG tablet Take 10 mg by mouth daily.   amLODipine (NORVASC) 2.5 MG tablet Take 1 tablet every day by oral route.   amLODipine (NORVASC) 5 MG tablet Take 1 tablet (5 mg total) by mouth daily.   amLODipine (NORVASC) 5 MG tablet Take by mouth.   aspirin 81 MG tablet Take 81 mg by mouth daily.   buPROPion (WELLBUTRIN) 100 MG tablet Take 100 mg by mouth 2 (two) times daily.   calcium-vitamin D (OSCAL WITH D) 500-5 MG-MCG tablet Take 1 tablet by mouth daily.   chlorthalidone (HYGROTON) 25 MG tablet Take 25 mg by mouth daily.   Cholecalciferol (VITAMIN D-1000 MAX ST) 25 MCG (1000 UT) tablet Take by mouth.   citalopram (CELEXA) 20 MG tablet Take by mouth.   citalopram (CELEXA) 40 MG tablet    cyanocobalamin (,VITAMIN B-12,) 1000 MCG/ML injection Inject 1 mL (1,000 mcg total) into the muscle once a week. Once a week for 3 weeks and Than Once Monthly (Patient not taking: Reported on 02/13/2021)   furosemide (LASIX) 20 MG tablet Take by mouth.   furosemide (LASIX) 40 MG tablet Take 1 tablet (40 mg total) by mouth daily.   hydrOXYzine (VISTARIL) 25 MG capsule Take 25 mg by mouth daily as needed for anxiety.   labetalol (NORMODYNE) 200 MG tablet Take 1 tablet (200 mg total) by mouth 2 (two) times daily. (Patient not taking: Reported on 11/13/2021)   levothyroxine (SYNTHROID) 100 MCG tablet Take 100 mcg by mouth daily.   levothyroxine (SYNTHROID) 112 MCG tablet  Take 1 tablet every day by oral route.   levothyroxine (SYNTHROID) 75 MCG tablet    levothyroxine (SYNTHROID) 88 MCG tablet    lisinopril (ZESTRIL) 20 MG tablet TK 1 T PO BID (Patient not taking: Reported on 11/13/2021)   losartan (COZAAR) 100 MG tablet Take 100 mg by mouth daily.   Melatonin 5 MG CAPS Take by mouth. (Patient not taking: Reported on 11/13/2021)   metoprolol succinate (TOPROL XL) 50 MG 24 hr tablet Take 1 tablet every day by oral route.   metoprolol tartrate (LOPRESSOR) 50 MG tablet TK 1 T PO BID   mirtazapine (REMERON) 30 MG tablet TK 1 T PO HS   nortriptyline (PAMELOR) 25 MG capsule Take 1 capsule 3 times a day by oral route.   potassium chloride (KLOR-CON 10) 10 MEQ tablet Take 2 tablets (20 mEq total) by mouth daily.   pravastatin (PRAVACHOL) 20 MG tablet Take 20 mg by mouth daily.   QUEtiapine (SEROQUEL) 100 MG tablet She is taking 2 tab at bedtime   QUEtiapine (SEROQUEL) 25 MG tablet Take 1 tablet twice a day by oral route.   sertraline (ZOLOFT) 100 MG tablet    traZODone (DESYREL) 50 MG tablet Take 25 mg by mouth at bedtime as needed. (Patient not taking: Reported on 11/13/2021)   vitamin B-12 (CYANOCOBALAMIN) 1000 MCG tablet Take 1  tablet (1,000 mcg total) by mouth daily.   No facility-administered medications prior to visit.    Review of Systems  {Labs  Heme  Chem  Endocrine  Serology  Results Review (optional):23779}   Objective    There were no vitals taken for this visit. {Show previous vital signs (optional):23777}  Physical Exam  ***  No results found for any visits on 11/28/21.  Assessment & Plan     ***  No follow-ups on file.      {provider attestation***:1}   Mikey Kirschner, PA-C  [facility] [phone] (phone) [phone] (fax)  [place]

## 2021-12-11 ENCOUNTER — Ambulatory Visit: Payer: Self-pay | Admitting: *Deleted

## 2021-12-11 NOTE — Telephone Encounter (Signed)
Summary: constipation / rx req   The patient has called to share that they've experienced constipation for roughly 3 days   The patient would like to be prescribed something for their discomfort   The patient is currently in [facility] and would like a prescription submitted to a pharmacy close to them   Please contact further when possible      Chief Complaint: constipation Symptoms: LBM 4 days ago Frequency: constant Pertinent Negatives: Patient denies vomiting, fever Disposition: '[]'$ ED /'[]'$ Urgent Care (no appt availability in office) / '[]'$ Appointment(In office/virtual)/ '[]'$  [facility]/ '[]'$ Home Care/ '[]'$ Refused Recommended Disposition /'[]'$ [place] [facility]/ '[x]'$  Follow-up with PCP Additional Notes: Pt is out of town, she is going to try Miralax and Dulcolax supp tonight and in the morning. Pt is able to pass gas. Encouraged to walk a lot this evening. If no relief after rwo doses she will call back for a virtual visit.  Reason for Disposition  [1] Uses laxative (e.g., PEG / Miralax, Milk of Magnesia) or enema AND [2] more than once a month  Answer Assessment - Initial Assessment Questions 1. STOOL PATTERN OR FREQUENCY: "How often do you have a bowel movement (BM)?"  (Normal range: 3 times a day to every 3 days)  "When was your last BM?"       4 days ago 2. STRAINING: "Do you have to strain to have a BM?"      Regular bm 4 days ago 3. RECTAL PAIN: "Does your rectum hurt when the stool comes out?" If Yes, ask: "Do you have hemorrhoids? How bad is the pain?"  (Scale 1-10; or mild, moderate, severe)     no 4. STOOL COMPOSITION: "Are the stools hard?"      no 5. BLOOD ON STOOLS: "Has there been any blood on the toilet tissue or on the surface of the BM?" If Yes, ask: "When was the last time?"     no 6. CHRONIC CONSTIPATION: "Is this a new problem for you?"  If No, ask: "How long have you had this problem?" (days, weeks, months)      no 7. CHANGES IN DIET OR HYDRATION:  "Have there been any recent changes in your diet?" "How much fluids are you drinking on a daily basis?"  "How much have you had to drink today?"     drinking 8. MEDICINES: "Have you been taking any new medicines?" "Are you taking any narcotic pain medicines?" (e.g., Dilaudid, morphine, Percocet, Vicodin)     no 9. LAXATIVES: "Have you been using any stool softeners, laxatives, or enemas?"  If Yes, ask "What, how often, and when was the last time?"     OTC  10. ACTIVITY:  "How much walking do you do every day?"  "Has your activity level decreased in the past week?"        no 11. CAUSE: "What do you think is causing the constipation?"        unknown 12. OTHER SYMPTOMS: "Do you have any other symptoms?" (e.g., abdomen pain, bloating, fever, vomiting)       no 13. MEDICAL HISTORY: "Do you have a history of hemorrhoids, rectal fissures, or rectal surgery or rectal abscess?"         Yes 14. PREGNANCY: "Is there any chance you are pregnant?" "When was your last menstrual period?"       no  Protocols used: Constipation-A-AH

## 2021-12-12 NOTE — Telephone Encounter (Signed)
Agree with recs, mirilax otc increase fluids if no relief in a few days can do virtual visit

## 2022-05-09 NOTE — Progress Notes (Deleted)
Established patient visit   Patient: Wanda Ochoa   DOB: 02-27-1946   76 y.o. Female  MRN: [id number] Visit Date: 05/10/2022  Today's healthcare provider: Mikey Kirschner, PA-C   No chief complaint on file.  Subjective    HPI  Depression, Follow-up  She  was last seen for this 09/28/2020 with Wanda Ochoa.  Changes made at last visit include no changes. Continue Celexa 10 mg qd, Pamelor 75 mg hs and Seroquel 100 mg hs. Hydroxyzine 25 mg qd prn anxiety at discharge from hospital    She reports {excellent/good/fair/poor:19665} compliance with treatment. She {is/is not:21021397} having side effects. ***  She reports {DESC; GOOD/FAIR/POOR:18685} tolerance of treatment. Current symptoms include: {Symptoms; depression:1002} She feels she is {improved/worse/unchanged:3041574} since last visit.     11/13/2021    1:38 PM 02/12/2021    1:46 PM 08/10/2019    8:29 AM  Depression screen PHQ 2/9  Decreased Interest 0 0 0  Down, Depressed, Hopeless 1 0 0  PHQ - 2 Score 1 0 0  Altered sleeping 0 1   Tired, decreased energy 0 0   Change in appetite 0 0   Feeling bad or failure about yourself  0 0   Trouble concentrating 0 0   Moving slowly or fidgety/restless 0 0   Suicidal thoughts 0 0   PHQ-9 Score 1 1   Difficult doing work/chores Not difficult at all Not difficult at all     -----------------------------------------------------------------------------------------   Medications: Outpatient Medications Prior to Visit  Medication Sig   albuterol (VENTOLIN HFA) 108 (90 Base) MCG/ACT inhaler Inhale 2 puffs into the lungs every 4 (four) hours as needed for wheezing or shortness of breath. INHALE 2 PUFFS INTO THE LUNGS EVERY 6 HOURS AS NEEDED   amLODipine (NORVASC) 10 MG tablet Take 10 mg by mouth daily.   amLODipine (NORVASC) 2.5 MG tablet Take 1 tablet every day by oral route.   amLODipine (NORVASC) 5 MG tablet Take 1 tablet (5 mg total) by mouth daily.   amLODipine (NORVASC) 5  MG tablet Take by mouth.   aspirin 81 MG tablet Take 81 mg by mouth daily.   buPROPion (WELLBUTRIN) 100 MG tablet Take 100 mg by mouth 2 (two) times daily.   calcium-vitamin D (OSCAL WITH D) 500-5 MG-MCG tablet Take 1 tablet by mouth daily.   chlorthalidone (HYGROTON) 25 MG tablet Take 25 mg by mouth daily.   Cholecalciferol (VITAMIN D-1000 MAX ST) 25 MCG (1000 UT) tablet Take by mouth.   citalopram (CELEXA) 20 MG tablet Take by mouth.   citalopram (CELEXA) 40 MG tablet    cyanocobalamin (,VITAMIN B-12,) 1000 MCG/ML injection Inject 1 mL (1,000 mcg total) into the muscle once a week. Once a week for 3 weeks and Than Once Monthly (Patient not taking: Reported on 02/13/2021)   furosemide (LASIX) 20 MG tablet Take by mouth.   furosemide (LASIX) 40 MG tablet Take 1 tablet (40 mg total) by mouth daily.   hydrOXYzine (VISTARIL) 25 MG capsule Take 25 mg by mouth daily as needed for anxiety.   labetalol (NORMODYNE) 200 MG tablet Take 1 tablet (200 mg total) by mouth 2 (two) times daily. (Patient not taking: Reported on 11/13/2021)   levothyroxine (SYNTHROID) 100 MCG tablet Take 100 mcg by mouth daily.   levothyroxine (SYNTHROID) 112 MCG tablet Take 1 tablet every day by oral route.   levothyroxine (SYNTHROID) 75 MCG tablet    levothyroxine (SYNTHROID) 88 MCG tablet    lisinopril (  ZESTRIL) 20 MG tablet TK 1 T PO BID (Patient not taking: Reported on 11/13/2021)   losartan (COZAAR) 100 MG tablet Take 100 mg by mouth daily.   Melatonin 5 MG CAPS Take by mouth. (Patient not taking: Reported on 11/13/2021)   metoprolol succinate (TOPROL XL) 50 MG 24 hr tablet Take 1 tablet every day by oral route.   metoprolol tartrate (LOPRESSOR) 50 MG tablet TK 1 T PO BID   mirtazapine (REMERON) 30 MG tablet TK 1 T PO HS   nortriptyline (PAMELOR) 25 MG capsule Take 1 capsule 3 times a day by oral route.   potassium chloride (KLOR-CON 10) 10 MEQ tablet Take 2 tablets (20 mEq total) by mouth daily.   pravastatin (PRAVACHOL)  20 MG tablet Take 20 mg by mouth daily.   QUEtiapine (SEROQUEL) 100 MG tablet She is taking 2 tab at bedtime   QUEtiapine (SEROQUEL) 25 MG tablet Take 1 tablet twice a day by oral route.   sertraline (ZOLOFT) 100 MG tablet    traZODone (DESYREL) 50 MG tablet Take 25 mg by mouth at bedtime as needed. (Patient not taking: Reported on 11/13/2021)   vitamin B-12 (CYANOCOBALAMIN) 1000 MCG tablet Take 1 tablet (1,000 mcg total) by mouth daily.   No facility-administered medications prior to visit.    Review of Systems  {Labs  Heme  Chem  Endocrine  Serology  Results Review (optional):23779}   Objective    There were no vitals taken for this visit. {Show previous vital signs (optional):23777}  Physical Exam  ***  No results found for any visits on 05/10/22.  Assessment & Plan     ***  No follow-ups on file.      {provider attestation***:1}   Mikey Kirschner, PA-C  [place] [phone] (phone) [phone] (fax)  [place]

## 2022-05-10 ENCOUNTER — Ambulatory Visit: Payer: Medicare Other | Admitting: Physician Assistant

## 2022-06-03 ENCOUNTER — Telehealth: Payer: Self-pay

## 2022-06-03 NOTE — Transitions of Care (Post Inpatient/ED Visit) (Signed)
   06/03/2022  Name: Wanda Ochoa MRN: [id number] DOB: 08/02/1945  Today's TOC FU Call Status:    Attempted to reach the patient regarding the most recent Inpatient/ED visit.  Follow Up Plan: Patient is currently still inpatient in [facility] in [place].  Will await discharge for appropriate Transition of Care.  Signature [employer]

## 2022-06-03 NOTE — Telephone Encounter (Signed)
See TOC encounter

## 2022-11-18 ENCOUNTER — Telehealth: Payer: Self-pay

## 2022-11-18 NOTE — Telephone Encounter (Signed)
Copied from CRM [id number]. Topic: General - Other >> Nov 18, 2022 11:07 AM Phill Myron wrote: Ms. Montano just called and stated she has moved and is now residing in [place], so an appt is not necessary

## 2023-08-24 DEATH — deceased
